# Patient Record
Sex: Female | Born: 1955 | Race: White | Hispanic: No | Marital: Single | Smoking: Never smoker
Health system: Southern US, Community
[De-identification: ages and names within clinical notes are randomized; demographics above are authoritative.]

## PROBLEM LIST (undated history)

## (undated) DIAGNOSIS — Z8709 Personal history of other diseases of the respiratory system: Secondary | ICD-10-CM

## (undated) DIAGNOSIS — K2270 Barrett's esophagus without dysplasia: Secondary | ICD-10-CM

## (undated) DIAGNOSIS — F419 Anxiety disorder, unspecified: Secondary | ICD-10-CM

## (undated) DIAGNOSIS — D649 Anemia, unspecified: Secondary | ICD-10-CM

## (undated) DIAGNOSIS — F329 Major depressive disorder, single episode, unspecified: Secondary | ICD-10-CM

## (undated) DIAGNOSIS — E119 Type 2 diabetes mellitus without complications: Secondary | ICD-10-CM

## (undated) DIAGNOSIS — R42 Dizziness and giddiness: Secondary | ICD-10-CM

## (undated) DIAGNOSIS — J189 Pneumonia, unspecified organism: Secondary | ICD-10-CM

## (undated) DIAGNOSIS — I10 Essential (primary) hypertension: Secondary | ICD-10-CM

## (undated) DIAGNOSIS — M1990 Unspecified osteoarthritis, unspecified site: Secondary | ICD-10-CM

## (undated) DIAGNOSIS — F4310 Post-traumatic stress disorder, unspecified: Secondary | ICD-10-CM

## (undated) DIAGNOSIS — C801 Malignant (primary) neoplasm, unspecified: Secondary | ICD-10-CM

## (undated) DIAGNOSIS — R569 Unspecified convulsions: Secondary | ICD-10-CM

## (undated) DIAGNOSIS — Z8701 Personal history of pneumonia (recurrent): Secondary | ICD-10-CM

## (undated) DIAGNOSIS — N289 Disorder of kidney and ureter, unspecified: Secondary | ICD-10-CM

## (undated) DIAGNOSIS — Z9289 Personal history of other medical treatment: Secondary | ICD-10-CM

## (undated) DIAGNOSIS — K589 Irritable bowel syndrome without diarrhea: Secondary | ICD-10-CM

## (undated) DIAGNOSIS — F32A Depression, unspecified: Secondary | ICD-10-CM

## (undated) DIAGNOSIS — K219 Gastro-esophageal reflux disease without esophagitis: Secondary | ICD-10-CM

## (undated) HISTORY — PX: ABDOMINAL HYSTERECTOMY: SHX81

## (undated) HISTORY — PX: HEMORRHOID SURGERY: SHX153

---

## 2004-10-06 ENCOUNTER — Other Ambulatory Visit: Payer: Self-pay

## 2004-10-06 ENCOUNTER — Inpatient Hospital Stay: Payer: Self-pay | Admitting: Internal Medicine

## 2004-10-20 ENCOUNTER — Ambulatory Visit: Payer: Self-pay | Admitting: Family Medicine

## 2004-11-19 ENCOUNTER — Ambulatory Visit: Payer: Self-pay | Admitting: Family Medicine

## 2004-11-27 ENCOUNTER — Ambulatory Visit: Payer: Self-pay | Admitting: Internal Medicine

## 2004-11-28 ENCOUNTER — Ambulatory Visit: Payer: Self-pay | Admitting: Internal Medicine

## 2004-12-18 ENCOUNTER — Ambulatory Visit (HOSPITAL_COMMUNITY): Admission: RE | Admit: 2004-12-18 | Discharge: 2004-12-18 | Payer: Self-pay | Admitting: General Surgery

## 2005-01-15 ENCOUNTER — Encounter: Admission: RE | Admit: 2005-01-15 | Discharge: 2005-01-15 | Payer: Self-pay | Admitting: Family Medicine

## 2005-04-27 HISTORY — PX: JOINT REPLACEMENT: SHX530

## 2005-05-26 ENCOUNTER — Inpatient Hospital Stay (HOSPITAL_COMMUNITY): Admission: RE | Admit: 2005-05-26 | Discharge: 2005-05-31 | Payer: Self-pay | Admitting: Orthopedic Surgery

## 2005-05-26 ENCOUNTER — Ambulatory Visit: Payer: Self-pay | Admitting: Physical Medicine & Rehabilitation

## 2005-06-23 ENCOUNTER — Encounter: Payer: Self-pay | Admitting: Orthopedic Surgery

## 2005-08-18 ENCOUNTER — Ambulatory Visit: Payer: Self-pay | Admitting: Internal Medicine

## 2005-08-19 ENCOUNTER — Ambulatory Visit: Payer: Self-pay | Admitting: Internal Medicine

## 2006-03-03 ENCOUNTER — Emergency Department: Payer: Self-pay | Admitting: Emergency Medicine

## 2008-02-21 ENCOUNTER — Emergency Department: Payer: Self-pay | Admitting: Emergency Medicine

## 2008-11-20 ENCOUNTER — Other Ambulatory Visit: Payer: Self-pay | Admitting: Internal Medicine

## 2008-12-04 ENCOUNTER — Other Ambulatory Visit: Payer: Self-pay | Admitting: Internal Medicine

## 2009-03-05 ENCOUNTER — Ambulatory Visit: Payer: Self-pay | Admitting: Gastroenterology

## 2009-04-27 HISTORY — PX: CYST EXCISION: SHX5701

## 2009-07-17 ENCOUNTER — Ambulatory Visit: Payer: Self-pay | Admitting: Internal Medicine

## 2009-07-24 ENCOUNTER — Ambulatory Visit: Payer: Self-pay | Admitting: Internal Medicine

## 2009-08-14 ENCOUNTER — Ambulatory Visit: Payer: Self-pay | Admitting: Neurology

## 2009-10-22 ENCOUNTER — Other Ambulatory Visit: Payer: Self-pay | Admitting: Internal Medicine

## 2010-01-31 ENCOUNTER — Emergency Department: Payer: Self-pay | Admitting: Emergency Medicine

## 2010-02-10 ENCOUNTER — Ambulatory Visit: Payer: Self-pay | Admitting: Gastroenterology

## 2010-02-12 LAB — PATHOLOGY REPORT

## 2010-03-17 ENCOUNTER — Ambulatory Visit: Payer: Self-pay | Admitting: Otolaryngology

## 2010-03-18 LAB — PATHOLOGY REPORT

## 2010-03-27 ENCOUNTER — Ambulatory Visit: Payer: Self-pay

## 2010-12-03 ENCOUNTER — Other Ambulatory Visit: Payer: Self-pay

## 2010-12-04 ENCOUNTER — Ambulatory Visit: Payer: Self-pay | Admitting: Internal Medicine

## 2011-03-02 ENCOUNTER — Ambulatory Visit: Payer: Self-pay | Admitting: Gastroenterology

## 2011-07-08 ENCOUNTER — Ambulatory Visit: Payer: Self-pay

## 2011-09-25 ENCOUNTER — Emergency Department: Payer: Self-pay | Admitting: Emergency Medicine

## 2011-09-25 LAB — COMPREHENSIVE METABOLIC PANEL
Albumin: 3.7 g/dL (ref 3.4–5.0)
BUN: 14 mg/dL (ref 7–18)
Calcium, Total: 9.1 mg/dL (ref 8.5–10.1)
Co2: 26 mmol/L (ref 21–32)
EGFR (African American): >60
EGFR (Non-African Amer.): >60
Glucose: 111 mg/dL — ABNORMAL HIGH (ref 65–99)
Potassium: 3.9 mmol/L (ref 3.5–5.1)
SGOT(AST): 28 U/L (ref 15–37)
Total Protein: 7.3 g/dL (ref 6.4–8.2)

## 2011-09-25 LAB — CBC
HCT: 35.8 % (ref 35.0–47.0)
MCH: 28 pg (ref 26.0–34.0)
MCHC: 34.3 g/dL (ref 32.0–36.0)
Platelet: 199 10*3/uL (ref 150–440)
RBC: 4.37 10*6/uL (ref 3.80–5.20)
WBC: 4.9 10*3/uL (ref 3.6–11.0)

## 2011-09-25 LAB — URINALYSIS, COMPLETE
Bilirubin,UR: NEGATIVE
Blood: NEGATIVE
Leukocyte Esterase: NEGATIVE
Nitrite: NEGATIVE
RBC,UR: 1 /HPF (ref 0–5)
Squamous Epithelial: 4

## 2011-09-25 LAB — PROTIME-INR
INR: 1.1
Prothrombin Time: 14.6 secs (ref 11.5–14.7)

## 2011-09-25 LAB — PREGNANCY, URINE: Pregnancy Test, Urine: NEGATIVE m[IU]/mL

## 2011-10-06 ENCOUNTER — Ambulatory Visit: Payer: Self-pay | Admitting: Gastroenterology

## 2011-10-27 ENCOUNTER — Other Ambulatory Visit: Payer: Self-pay

## 2011-10-27 LAB — COMPREHENSIVE METABOLIC PANEL
Albumin: 4 g/dL (ref 3.4–5.0)
Alkaline Phosphatase: 109 U/L (ref 50–136)
Anion Gap: 12 (ref 7–16)
Calcium, Total: 8.7 mg/dL (ref 8.5–10.1)
EGFR (African American): >60
Glucose: 131 mg/dL — ABNORMAL HIGH (ref 65–99)
SGOT(AST): 26 U/L (ref 15–37)
SGPT (ALT): 32 U/L

## 2011-10-27 LAB — LIPID PANEL
Cholesterol: 215 mg/dL — ABNORMAL HIGH (ref 0–200)
Ldl Cholesterol, Calc: 116 mg/dL — ABNORMAL HIGH (ref 0–100)

## 2011-10-27 LAB — CBC WITH DIFFERENTIAL/PLATELET
Eosinophil #: 0.1 10*3/uL (ref 0.0–0.7)
HCT: 37.9 % (ref 35.0–47.0)
Lymphocyte #: 1.4 10*3/uL (ref 1.0–3.6)
MCHC: 33.9 g/dL (ref 32.0–36.0)
MCV: 82 fL (ref 80–100)
Neutrophil #: 5 10*3/uL (ref 1.4–6.5)
RDW: 15.7 % — ABNORMAL HIGH (ref 11.5–14.5)

## 2011-10-27 LAB — TSH: Thyroid Stimulating Horm: 3.05 u[IU]/mL

## 2011-11-05 ENCOUNTER — Other Ambulatory Visit: Payer: Self-pay | Admitting: Internal Medicine

## 2012-05-27 ENCOUNTER — Other Ambulatory Visit: Payer: Self-pay

## 2012-05-27 LAB — CBC WITH DIFFERENTIAL/PLATELET
Basophil #: 0 10*3/uL (ref 0.0–0.1)
Basophil %: 0.7 %
Eosinophil #: 0.1 10*3/uL (ref 0.0–0.7)
Eosinophil %: 2 %
HGB: 11.8 g/dL — ABNORMAL LOW (ref 12.0–16.0)
MCH: 26.9 pg (ref 26.0–34.0)
MCHC: 34.1 g/dL (ref 32.0–36.0)
Monocyte #: 0.5 x10 3/mm (ref 0.2–0.9)
RBC: 4.4 10*6/uL (ref 3.80–5.20)
RDW: 15.1 % — ABNORMAL HIGH (ref 11.5–14.5)
WBC: 5.3 10*3/uL (ref 3.6–11.0)

## 2012-05-27 LAB — COMPREHENSIVE METABOLIC PANEL
Albumin: 4.1 g/dL (ref 3.4–5.0)
Alkaline Phosphatase: 110 U/L (ref 50–136)
Calcium, Total: 9 mg/dL (ref 8.5–10.1)
EGFR (African American): >60
EGFR (Non-African Amer.): >60
Glucose: 100 mg/dL — ABNORMAL HIGH (ref 65–99)
Osmolality: 267 (ref 275–301)
SGOT(AST): 32 U/L (ref 15–37)
SGPT (ALT): 29 U/L (ref 12–78)
Total Protein: 8.3 g/dL — ABNORMAL HIGH (ref 6.4–8.2)

## 2012-08-24 ENCOUNTER — Ambulatory Visit: Payer: Self-pay

## 2012-08-24 LAB — CREATININE, SERUM: EGFR (African American): >60

## 2013-05-09 ENCOUNTER — Ambulatory Visit: Payer: Self-pay

## 2013-08-17 DIAGNOSIS — I10 Essential (primary) hypertension: Secondary | ICD-10-CM | POA: Diagnosis present

## 2013-08-17 DIAGNOSIS — G40909 Epilepsy, unspecified, not intractable, without status epilepticus: Secondary | ICD-10-CM | POA: Insufficient documentation

## 2013-08-17 DIAGNOSIS — K929 Disease of digestive system, unspecified: Secondary | ICD-10-CM | POA: Insufficient documentation

## 2013-08-17 DIAGNOSIS — R569 Unspecified convulsions: Secondary | ICD-10-CM | POA: Insufficient documentation

## 2013-08-17 DIAGNOSIS — E785 Hyperlipidemia, unspecified: Secondary | ICD-10-CM | POA: Insufficient documentation

## 2014-04-27 DIAGNOSIS — C801 Malignant (primary) neoplasm, unspecified: Secondary | ICD-10-CM

## 2014-04-27 HISTORY — DX: Malignant (primary) neoplasm, unspecified: C80.1

## 2014-08-02 DIAGNOSIS — G40309 Generalized idiopathic epilepsy and epileptic syndromes, not intractable, without status epilepticus: Secondary | ICD-10-CM | POA: Diagnosis not present

## 2014-08-02 DIAGNOSIS — F411 Generalized anxiety disorder: Secondary | ICD-10-CM | POA: Diagnosis not present

## 2014-08-02 DIAGNOSIS — D649 Anemia, unspecified: Secondary | ICD-10-CM | POA: Diagnosis not present

## 2014-08-02 DIAGNOSIS — K219 Gastro-esophageal reflux disease without esophagitis: Secondary | ICD-10-CM | POA: Diagnosis not present

## 2014-08-03 DIAGNOSIS — G40309 Generalized idiopathic epilepsy and epileptic syndromes, not intractable, without status epilepticus: Secondary | ICD-10-CM | POA: Diagnosis not present

## 2014-08-03 DIAGNOSIS — K210 Gastro-esophageal reflux disease with esophagitis: Secondary | ICD-10-CM | POA: Diagnosis not present

## 2014-08-03 DIAGNOSIS — D649 Anemia, unspecified: Secondary | ICD-10-CM | POA: Diagnosis not present

## 2014-08-03 DIAGNOSIS — I10 Essential (primary) hypertension: Secondary | ICD-10-CM | POA: Diagnosis not present

## 2014-08-20 DIAGNOSIS — K921 Melena: Secondary | ICD-10-CM | POA: Diagnosis not present

## 2014-08-20 DIAGNOSIS — D509 Iron deficiency anemia, unspecified: Secondary | ICD-10-CM | POA: Diagnosis not present

## 2014-08-20 DIAGNOSIS — K2270 Barrett's esophagus without dysplasia: Secondary | ICD-10-CM | POA: Diagnosis not present

## 2014-08-28 ENCOUNTER — Encounter: Payer: Self-pay | Admitting: *Deleted

## 2014-08-30 DIAGNOSIS — G40219 Localization-related (focal) (partial) symptomatic epilepsy and epileptic syndromes with complex partial seizures, intractable, without status epilepticus: Secondary | ICD-10-CM | POA: Diagnosis not present

## 2014-08-30 NOTE — Discharge Instructions (Signed)

## 2014-08-31 ENCOUNTER — Other Ambulatory Visit: Payer: Self-pay | Admitting: Gastroenterology

## 2014-08-31 ENCOUNTER — Ambulatory Visit: Payer: Medicare Other | Admitting: Anesthesiology

## 2014-08-31 ENCOUNTER — Encounter
Admission: RE | Disposition: A | Discharge status: Home / Self Care | Payer: Self-pay | Source: Ambulatory Visit | Attending: Gastroenterology

## 2014-08-31 ENCOUNTER — Ambulatory Visit
Admission: RE | Admit: 2014-08-31 | Discharge: 2014-08-31 | Disposition: A | Discharge status: Home / Self Care | Payer: Medicare Other | Source: Ambulatory Visit | Attending: Gastroenterology | Admitting: Gastroenterology

## 2014-08-31 DIAGNOSIS — K2950 Unspecified chronic gastritis without bleeding: Secondary | ICD-10-CM | POA: Insufficient documentation

## 2014-08-31 DIAGNOSIS — D509 Iron deficiency anemia, unspecified: Secondary | ICD-10-CM | POA: Diagnosis not present

## 2014-08-31 DIAGNOSIS — F419 Anxiety disorder, unspecified: Secondary | ICD-10-CM | POA: Insufficient documentation

## 2014-08-31 DIAGNOSIS — F329 Major depressive disorder, single episode, unspecified: Secondary | ICD-10-CM | POA: Diagnosis not present

## 2014-08-31 DIAGNOSIS — K2970 Gastritis, unspecified, without bleeding: Secondary | ICD-10-CM | POA: Diagnosis not present

## 2014-08-31 DIAGNOSIS — K589 Irritable bowel syndrome without diarrhea: Secondary | ICD-10-CM | POA: Insufficient documentation

## 2014-08-31 DIAGNOSIS — K921 Melena: Secondary | ICD-10-CM | POA: Diagnosis not present

## 2014-08-31 DIAGNOSIS — Z90710 Acquired absence of both cervix and uterus: Secondary | ICD-10-CM | POA: Diagnosis not present

## 2014-08-31 DIAGNOSIS — D124 Benign neoplasm of descending colon: Secondary | ICD-10-CM | POA: Insufficient documentation

## 2014-08-31 DIAGNOSIS — Z79899 Other long term (current) drug therapy: Secondary | ICD-10-CM | POA: Insufficient documentation

## 2014-08-31 DIAGNOSIS — R569 Unspecified convulsions: Secondary | ICD-10-CM | POA: Insufficient documentation

## 2014-08-31 DIAGNOSIS — R509 Fever, unspecified: Secondary | ICD-10-CM | POA: Diagnosis not present

## 2014-08-31 DIAGNOSIS — K219 Gastro-esophageal reflux disease without esophagitis: Secondary | ICD-10-CM | POA: Diagnosis not present

## 2014-08-31 DIAGNOSIS — Z96641 Presence of right artificial hip joint: Secondary | ICD-10-CM | POA: Diagnosis not present

## 2014-08-31 DIAGNOSIS — I10 Essential (primary) hypertension: Secondary | ICD-10-CM | POA: Insufficient documentation

## 2014-08-31 DIAGNOSIS — Z9889 Other specified postprocedural states: Secondary | ICD-10-CM | POA: Insufficient documentation

## 2014-08-31 DIAGNOSIS — Z791 Long term (current) use of non-steroidal anti-inflammatories (NSAID): Secondary | ICD-10-CM | POA: Diagnosis not present

## 2014-08-31 DIAGNOSIS — K229 Disease of esophagus, unspecified: Secondary | ICD-10-CM | POA: Diagnosis not present

## 2014-08-31 DIAGNOSIS — K2270 Barrett's esophagus without dysplasia: Secondary | ICD-10-CM | POA: Diagnosis not present

## 2014-08-31 DIAGNOSIS — K641 Second degree hemorrhoids: Secondary | ICD-10-CM | POA: Insufficient documentation

## 2014-08-31 HISTORY — DX: Irritable bowel syndrome, unspecified: K58.9

## 2014-08-31 HISTORY — DX: Dizziness and giddiness: R42

## 2014-08-31 HISTORY — DX: Unspecified convulsions: R56.9

## 2014-08-31 HISTORY — DX: Essential (primary) hypertension: I10

## 2014-08-31 HISTORY — DX: Gastro-esophageal reflux disease without esophagitis: K21.9

## 2014-08-31 HISTORY — DX: Major depressive disorder, single episode, unspecified: F32.9

## 2014-08-31 HISTORY — DX: Barrett's esophagus without dysplasia: K22.70

## 2014-08-31 HISTORY — DX: Anxiety disorder, unspecified: F41.9

## 2014-08-31 HISTORY — DX: Depression, unspecified: F32.A

## 2014-08-31 HISTORY — PX: COLONOSCOPY: SHX5424

## 2014-08-31 HISTORY — PX: ESOPHAGOGASTRODUODENOSCOPY: SHX5428

## 2014-08-31 HISTORY — DX: Anemia, unspecified: D64.9

## 2014-08-31 SURGERY — COLONOSCOPY
Anesthesia: Monitor Anesthesia Care | Wound class: Contaminated

## 2014-08-31 MED ORDER — ACETAMINOPHEN 325 MG PO TABS: 325.0000 mg | ORAL_TABLET | ORAL | Status: DC | PRN

## 2014-08-31 MED ORDER — STERILE WATER FOR IRRIGATION IR SOLN
Status: DC | PRN
  Administered 2014-08-31: 11:00:00

## 2014-08-31 MED ORDER — ACETAMINOPHEN 160 MG/5ML PO SOLN: 325.0000 mg | ORAL | Status: DC | PRN

## 2014-08-31 MED ORDER — PROPOFOL 10 MG/ML IV BOLUS
INTRAVENOUS | Status: DC | PRN
  Administered 2014-08-31 (×6): 50 mg via INTRAVENOUS

## 2014-08-31 MED ORDER — LACTATED RINGERS IV SOLN
INTRAVENOUS | Status: DC
  Administered 2014-08-31: 11:00:00 via INTRAVENOUS

## 2014-08-31 MED ORDER — LIDOCAINE HCL (CARDIAC) 20 MG/ML IV SOLN
INTRAVENOUS | Status: DC | PRN
  Administered 2014-08-31: 50 mg via INTRAVENOUS

## 2014-08-31 SURGICAL SUPPLY — 38 items
BALLN DILATOR 10-12 8 (BALLOONS)
BALLN DILATOR 12-15 8 (BALLOONS)
BALLN DILATOR 15-18 8 (BALLOONS)
BALLN DILATOR CRE 0-12 8 (BALLOONS)
BALLN DILATOR ESOPH 8 10 CRE (MISCELLANEOUS) IMPLANT
BALLOON DILATOR 12-15 8 (BALLOONS) IMPLANT
BALLOON DILATOR 15-18 8 (BALLOONS) IMPLANT
BALLOON DILATOR CRE 0-12 8 (BALLOONS) IMPLANT
BLOCK BITE 60FR ADLT L/F GRN (MISCELLANEOUS) ×3 IMPLANT
CANISTER SUCT 1200ML W/VALVE (MISCELLANEOUS) ×3 IMPLANT
FCP ESCP3.2XJMB 240X2.8X (MISCELLANEOUS) ×1
FORCEPS BIOP RAD 4 LRG CAP 4 (CUTTING FORCEPS) IMPLANT
FORCEPS BIOP RJ4 240 W/NDL (MISCELLANEOUS) ×3
FORCEPS ESCP3.2XJMB 240X2.8X (MISCELLANEOUS) IMPLANT
GOWN CVR UNV OPN BCK APRN NK (MISCELLANEOUS) ×2 IMPLANT
GOWN ISOL THUMB LOOP REG UNIV (MISCELLANEOUS) ×6
HEMOCLIP INSTINCT (CLIP) IMPLANT
INJECTOR VARIJECT VIN23 (MISCELLANEOUS) IMPLANT
KIT CO2 TUBING (TUBING) ×3 IMPLANT
KIT DEFENDO VALVE AND CONN (KITS) IMPLANT
KIT ENDO PROCEDURE OLY (KITS) ×3 IMPLANT
LIGATOR MULTIBAND 6SHOOTER MBL (MISCELLANEOUS) IMPLANT
MARKER SPOT ENDO TATTOO 5ML (MISCELLANEOUS) IMPLANT
PAD GROUND ADULT SPLIT (MISCELLANEOUS) IMPLANT
SNARE SHORT THROW 13M SML OVAL (MISCELLANEOUS) IMPLANT
SNARE SHORT THROW 30M LRG OVAL (MISCELLANEOUS) IMPLANT
SPOT EX ENDOSCOPIC TATTOO (MISCELLANEOUS)
SUCTION POLY TRAP 4CHAMBER (MISCELLANEOUS) IMPLANT
SYR INFLATION 60ML (SYRINGE) IMPLANT
TRAP SUCTION POLY (MISCELLANEOUS) IMPLANT
TUBING CONN 6MMX3.1M (TUBING)
TUBING SUCTION CONN 0.25 STRL (TUBING) IMPLANT
UNDERPAD 30X60 958B10 (PK) (MISCELLANEOUS) IMPLANT
VALVE BIOPSY ENDO (VALVE) IMPLANT
VARIJECT INJECTOR VIN23 (MISCELLANEOUS)
WATER AUXILLARY (MISCELLANEOUS) IMPLANT
WATER STERILE IRR 500ML POUR (IV SOLUTION) ×3 IMPLANT
WIRE CRE 18-20MM 8CM F G (MISCELLANEOUS) IMPLANT

## 2014-08-31 NOTE — Op Note (Signed)
[facility] Gastroenterology Patient Name: Christina French Procedure Date: 08/31/2014 10:31 AM MRN: [id number] Account #: [id number] Date of Birth: Oct 26, 1955 Admit Type: Outpatient Age: 59 Room: [facility] OR ROOM 01 Gender: Female Note Status: Finalized Procedure:         Colonoscopy Indications:       Iron deficiency anemia Providers:         Lucilla Lame, MD Medicines:         Propofol per Anesthesia Complications:     No immediate complications. Procedure:         Pre-Anesthesia Assessment:                    - Prior to the procedure, a History and Physical was                     performed, and patient medications and allergies were                     reviewed. The patient's tolerance of previous anesthesia                     was also reviewed. The risks and benefits of the procedure                     and the sedation options and risks were discussed with the                     patient. All questions were answered, and informed consent                     was obtained. Prior Anticoagulants: The patient has taken                     no previous anticoagulant or antiplatelet agents. ASA                     Grade Assessment: II - A patient with mild systemic                     disease. After reviewing the risks and benefits, the                     patient was deemed in satisfactory condition to undergo                     the procedure.                    After obtaining informed consent, the colonoscope was                     passed under direct vision. Throughout the procedure, the                     patient's blood pressure, pulse, and oxygen saturations                     were monitored continuously. The Olympus CF H180AL                     colonoscope (S#: [id number]) was introduced through the anus                     and advanced to the the cecum, identified by appendiceal  orifice and ileocecal valve. The colonoscopy was performed                without difficulty. The patient tolerated the procedure                     well. The quality of the bowel preparation was fair. Findings:      The perianal and digital rectal examinations were normal.      Two sessile polyps were found in the descending colon. The polyps were 3       to 4 mm in size. These polyps were removed with a cold biopsy forceps.       Resection and retrieval were complete.      Non-bleeding internal hemorrhoids were found during retroflexion. The       hemorrhoids were Grade II (internal hemorrhoids that prolapse but reduce       spontaneously).      Two biopsies were obtained with cold forceps for histology randomly in       the sigmoid colon. Impression:        - Two 3 to 4 mm polyps in the descending colon. Resected                     and retrieved.                    - Non-bleeding internal hemorrhoids.                    - Two biopsies were obtained in the sigmoid colon. Recommendation:    - Follow Hb.                    If continued to have bleeding then surgery consult. Procedure Code(s): --- Professional ---                    [phone], Colonoscopy, flexible; with biopsy, single or                     multiple Diagnosis Code(s): --- Professional ---                    D50.9, Iron deficiency anemia, unspecified                    D12.4, Benign neoplasm of descending colon CPT copyright 2014 American Medical Association. All rights reserved. The codes documented in this report are preliminary and upon coder review may  be revised to meet current compliance requirements. Lucilla Lame, MD 08/31/2014 11:02:36 AM This report has been signed electronically. Number of Addenda: 0 Note Initiated On: 08/31/2014 10:31 AM Scope Withdrawal Time: 0 hours 6 minutes 46 seconds  Total Procedure Duration: 0 hours 10 minutes 0 seconds       [facility]

## 2014-08-31 NOTE — Anesthesia Preprocedure Evaluation (Addendum)
Anesthesia Evaluation  Patient identified by MRN, date of birth, ID band Patient awake    Reviewed: Allergy & Precautions, H&P , NPO status , Patient's Chart, lab work & pertinent test results, reviewed documented beta blocker date and time   Airway Mallampati: I  TM Distance: >3 FB Neck ROM: full    Dental no notable dental hx.    Pulmonary neg pulmonary ROS,    Pulmonary exam normal       Cardiovascular hypertension, Normal cardiovascular exam    Neuro/Psych    GI/Hepatic Neg liver ROS, GERD-  ,  Endo/Other  negative endocrine ROS  Renal/GU negative Renal ROS     Musculoskeletal   Abdominal   Peds  Hematology  (+) anemia , Chronic anemia, from bleeding hemorrhoids. Hgb has been as low as 3.  Received blood transfusion last month after she experienced a seizure from severe anemia   Anesthesia Other Findings   Reproductive/Obstetrics                            Anesthesia Physical Anesthesia Plan  ASA: II  Anesthesia Plan: MAC   Post-op Pain Management:    Induction:   Airway Management Planned:   Additional Equipment:   Intra-op Plan:   Post-operative Plan:   Informed Consent: I have reviewed the patients History and Physical, chart, labs and discussed the procedure including the risks, benefits and alternatives for the proposed anesthesia with the patient or authorized representative who has indicated his/her understanding and acceptance.     Plan Discussed with: CRNA  Anesthesia Plan Comments:         Anesthesia Quick Evaluation

## 2014-08-31 NOTE — Anesthesia Procedure Notes (Signed)
Procedure Name: MAC Performed by: Sidney Kann Pre-anesthesia Checklist: Patient identified, Emergency Drugs available, Suction available, Timeout performed and Patient being monitored Patient Re-evaluated:Patient Re-evaluated prior to inductionOxygen Delivery Method: Nasal cannula Placement Confirmation: positive ETCO2     

## 2014-08-31 NOTE — Transfer of Care (Signed)
Immediate Anesthesia Transfer of Care Note  Patient: Christina French  Procedure(s) Performed: Procedure(s): COLONOSCOPY (N/A) ESOPHAGOGASTRODUODENOSCOPY (EGD) (N/A)  Patient Location: PACU  Anesthesia Type: MAC  Level of Consciousness: awake, alert  and patient cooperative  Airway and Oxygen Therapy: Patient Spontanous Breathing and Patient connected to supplemental oxygen  Post-op Assessment: Post-op Vital signs reviewed, Patient's Cardiovascular Status Stable, Respiratory Function Stable, Patent Airway and No signs of Nausea or vomiting  Post-op Vital Signs: Reviewed and stable  Complications: No apparent anesthesia complications

## 2014-08-31 NOTE — H&P (Signed)
@LOGO @  Primary Care Physician:  No primary care provider on file. Primary Gastroenterologist:  Dr. Allen Norris  Pre-Procedure History & Physical: HPI:  Christina French is a 59 y.o. female is here for an endoscopy and colonoscopy.   Past Medical History  Diagnosis Date  . Hypertension   . Anemia   . Anxiety   . Depression   . Seizures     rare - last one 07/30/14 - was very anemic  . GERD (gastroesophageal reflux disease)   . IBS (irritable bowel syndrome)   . Barrett esophagus   . Vertigo     thinks related to sinus issues    Past Surgical History  Procedure Laterality Date  . Abdominal hysterectomy    . Hemorrhoid surgery    . Joint replacement Right 2007    hip  . Cyst excision  2011    from throat  . Cesarean section      Prior to Admission medications   Medication Sig Start Date End Date Taking? Authorizing Provider  ALPRAZolam Duanne Moron) 1 MG tablet Take 1 mg by mouth 2 (two) times daily.   Yes Historical Provider, MD  atenolol (TENORMIN) 50 MG tablet Take 50 mg by mouth 2 (two) times daily.   Yes Historical Provider, MD  carbamazepine (TEGRETOL) 200 MG tablet Take 300 mg by mouth 2 (two) times daily.   Yes Historical Provider, MD  dexlansoprazole (DEXILANT) 60 MG capsule Take 60 mg by mouth daily. AM   Yes Historical Provider, MD  escitalopram (LEXAPRO) 20 MG tablet Take 20 mg by mouth daily. PM   Yes Historical Provider, MD  hyoscyamine (LEVSIN, ANASPAZ) 0.125 MG tablet Take 0.125 mg by mouth 4 (four) times daily as needed.   Yes Historical Provider, MD  ibuprofen (ADVIL,MOTRIN) 200 MG tablet Take 200 mg by mouth as needed.   Yes Historical Provider, MD  mirtazapine (REMERON) 15 MG tablet Take 15 mg by mouth at bedtime.   Yes Historical Provider, MD  ondansetron (ZOFRAN) 8 MG tablet Take by mouth 2 (two) times daily as needed for nausea or vomiting.   Yes Historical Provider, MD  traZODone (DESYREL) 50 MG tablet Take 50 mg by mouth at bedtime.   Yes Historical Provider, MD     Allergies as of 08/27/2014  . (Not on File)    History reviewed. No pertinent family history.  History   Social History  . Marital Status: Divorced    Spouse Name: N/A  . Number of Children: N/A  . Years of Education: N/A   Occupational History  . Not on file.   Social History Main Topics  . Smoking status: Never Smoker   . Smokeless tobacco: Not on file  . Alcohol Use: No  . Drug Use: Not on file  . Sexual Activity: Not on file   Other Topics Concern  . Not on file   Social History Narrative    Review of Systems: See HPI, otherwise negative ROS  Physical Exam: BP 123/78 mmHg  Pulse 75  Temp(Src) 97.5 F (36.4 C)  Resp 16  Ht 5\' 2"  (1.575 m)  Wt 194 lb (87.998 kg)  BMI 35.47 kg/m2  SpO2 100% General:   Alert,  pleasant and cooperative in NAD Head:  Normocephalic and atraumatic. Neck:  Supple; no masses or thyromegaly. Lungs:  Clear throughout to auscultation.    Heart:  Regular rate and rhythm. Abdomen:  Soft, nontender and nondistended. Normal bowel sounds, without guarding, and without rebound.   Neurologic:  Alert and  oriented x4;  grossly normal neurologically.  Impression/Plan: Christina French is here for an endoscopy and colonoscopy to be performed for IDA  Risks, benefits, limitations, and alternatives regarding  endoscopy and colonoscopy have been reviewed with the patient.  Questions have been answered.  All parties agreeable.   [facility], MD  08/31/2014, 10:34 AM

## 2014-08-31 NOTE — Op Note (Signed)
[facility] Gastroenterology Patient Name: Christina French Procedure Date: 08/31/2014 10:29 AM MRN: [id number] Account #: [id number] Date of Birth: April 10, 1956 Admit Type: Outpatient Age: 59 Room: [facility] OR ROOM 01 Gender: Female Note Status: Finalized Procedure:         Upper GI endoscopy Indications:       Iron deficiency anemia Providers:         Lucilla Lame, MD Medicines:         Propofol per Anesthesia Complications:     No immediate complications. Procedure:         Pre-Anesthesia Assessment:                    - Prior to the procedure, a History and Physical was                     performed, and patient medications and allergies were                     reviewed. The patient's tolerance of previous anesthesia                     was also reviewed. The risks and benefits of the procedure                     and the sedation options and risks were discussed with the                     patient. All questions were answered, and informed consent                     was obtained. Prior Anticoagulants: The patient has taken                     no previous anticoagulant or antiplatelet agents. ASA                     Grade Assessment: II - A patient with mild systemic                     disease. After reviewing the risks and benefits, the                     patient was deemed in satisfactory condition to undergo                     the procedure.                    After obtaining informed consent, the endoscope was passed                     under direct vision. Throughout the procedure, the                     patient's blood pressure, pulse, and oxygen saturations                     were monitored continuously. The Olympus GIF H180J                     colonscope (B#:[id number]) was introduced through the mouth,                     and advanced to the second part of duodenum. The  upper GI                     endoscopy was accomplished without difficulty. The patient                      tolerated the procedure well. Findings:      There were esophageal mucosal changes suspicious for long-segment       Barrett's esophagus present in the lower third of the esophagus. The       maximum longitudinal extent of these mucosal changes was 4 cm in length.       Mucosa was biopsied with a cold forceps for histology in 4 quadrants at       intervals of 1 cm in the lower third of the esophagus. A total of 3       specimen bottles were sent to pathology.      Localized mild inflammation characterized by erythema was found in the       gastric antrum. Biopsies were taken with a cold forceps for histology.      The examined duodenum was normal. Impression:        - Esophageal mucosal changes suspicious for long-segment                     Barrett's esophagus. Biopsied.                    - Gastritis. Biopsied.                    - Normal examined duodenum. Recommendation:    - Await pathology results.                    - Perform a colonoscopy today. Procedure Code(s): --- Professional ---                    [phone], Esophagogastroduodenoscopy, flexible, transoral;                     with biopsy, single or multiple Diagnosis Code(s): --- Professional ---                    D50.9, Iron deficiency anemia, unspecified                    K29.70, Gastritis, unspecified, without bleeding                    K22.9, Disease of esophagus, unspecified CPT copyright 2014 American Medical Association. All rights reserved. The codes documented in this report are preliminary and upon coder review may  be revised to meet current compliance requirements. Lucilla Lame, MD 08/31/2014 10:47:48 AM This report has been signed electronically. Number of Addenda: 0 Note Initiated On: 08/31/2014 10:29 AM Total Procedure Duration: 0 hours 4 minutes 49 seconds       [facility]

## 2014-08-31 NOTE — Anesthesia Postprocedure Evaluation (Signed)
  Anesthesia Post-op Note  Patient: Christina French  Procedure(s) Performed: Procedure(s): COLONOSCOPY (N/A) ESOPHAGOGASTRODUODENOSCOPY (EGD) (N/A)  Anesthesia type:MAC  Patient location: PACU  Post pain: Pain level controlled  Post assessment: Post-op Vital signs reviewed, Patient's Cardiovascular Status Stable, Respiratory Function Stable, Patent Airway and No signs of Nausea or vomiting  Post vital signs: Reviewed and stable  Last Vitals:  Filed Vitals:   08/31/14 1121  BP: 137/82  Pulse: 69  Temp:   Resp: 18    Level of consciousness: awake, alert  and patient cooperative  Complications: No apparent anesthesia complications

## 2014-09-04 ENCOUNTER — Encounter: Payer: Self-pay | Admitting: Gastroenterology

## 2014-09-04 DIAGNOSIS — D485 Neoplasm of uncertain behavior of skin: Secondary | ICD-10-CM | POA: Diagnosis not present

## 2014-09-04 DIAGNOSIS — R21 Rash and other nonspecific skin eruption: Secondary | ICD-10-CM | POA: Diagnosis not present

## 2014-09-10 DIAGNOSIS — K2270 Barrett's esophagus without dysplasia: Secondary | ICD-10-CM | POA: Diagnosis not present

## 2014-09-10 DIAGNOSIS — R140 Abdominal distension (gaseous): Secondary | ICD-10-CM | POA: Diagnosis not present

## 2014-09-10 DIAGNOSIS — K648 Other hemorrhoids: Secondary | ICD-10-CM | POA: Diagnosis not present

## 2014-09-10 DIAGNOSIS — D509 Iron deficiency anemia, unspecified: Secondary | ICD-10-CM | POA: Diagnosis not present

## 2014-09-18 ENCOUNTER — Ambulatory Visit: Admission: RE | Admit: 2014-09-18 | Payer: Medicare Other | Source: Ambulatory Visit | Admitting: Gastroenterology

## 2014-09-18 ENCOUNTER — Ambulatory Visit: Admit: 2014-09-18 | Payer: Self-pay | Admitting: Gastroenterology

## 2014-09-18 ENCOUNTER — Encounter: Admission: RE | Payer: Self-pay | Source: Ambulatory Visit

## 2014-09-18 SURGERY — IMAGING PROCEDURE, GI TRACT, INTRALUMINAL, VIA CAPSULE

## 2014-09-30 ENCOUNTER — Encounter: Payer: Self-pay | Admitting: Urgent Care

## 2014-09-30 NOTE — Telephone Encounter (Signed)
A user error has taken place: encounter opened in error, closed for administrative reasons.

## 2014-10-15 DIAGNOSIS — D039 Melanoma in situ, unspecified: Secondary | ICD-10-CM | POA: Diagnosis not present

## 2014-10-15 DIAGNOSIS — D0371 Melanoma in situ of right lower limb, including hip: Secondary | ICD-10-CM | POA: Diagnosis not present

## 2014-10-24 ENCOUNTER — Other Ambulatory Visit: Payer: Self-pay

## 2014-10-24 NOTE — Telephone Encounter (Signed)
Received Refill request for patient for Hyoscyamine sulfate 0.375 BID, PRN. PCP originally prescribed this medication but we are now patient's GI specialist.  Pt last seen in office 09/11/14 and was recommended to have a Given's Capsule Study as she had EGD and Colonoscopy on 09/04/14 with no source of Anemia.  Pt has not had Given's completed as of yet after multiple attempts to contact patient.  Do you want to refill Hyoscyamine or have Given's completed first?

## 2014-10-25 MED ORDER — HYOSCYAMINE SULFATE 0.125 MG PO TABS: 0.1250 mg | ORAL_TABLET | Freq: Four times a day (QID) | ORAL | Status: DC | PRN

## 2014-10-25 NOTE — Telephone Encounter (Signed)
OK for 30 day refill Please call & ask patient about reason for not scheduling capsule or send letter Thanks

## 2014-10-25 NOTE — Telephone Encounter (Signed)
Spoke with patient at this time.   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   She will call back when she feels up to having this completed.  I explained to the patient that I will send 30 day refill and that will give [place] time to get capsule completed.

## 2014-11-23 ENCOUNTER — Encounter: Payer: Self-pay | Admitting: Radiology

## 2014-11-23 ENCOUNTER — Emergency Department
Admission: EM | Admit: 2014-11-23 | Discharge: 2014-11-23 | Payer: Medicare Other | Attending: Emergency Medicine | Admitting: Emergency Medicine

## 2014-11-23 ENCOUNTER — Emergency Department: Payer: Medicare Other

## 2014-11-23 ENCOUNTER — Inpatient Hospital Stay (HOSPITAL_COMMUNITY)
Admission: EM | Admit: 2014-11-23 | Discharge: 2014-12-04 | DRG: 854 | Disposition: A | Discharge status: Skilled Nursing Facility | Payer: Medicare Other | Attending: Orthopaedic Surgery | Admitting: Orthopaedic Surgery

## 2014-11-23 DIAGNOSIS — Y9389 Activity, other specified: Secondary | ICD-10-CM | POA: Insufficient documentation

## 2014-11-23 DIAGNOSIS — Y9289 Other specified places as the place of occurrence of the external cause: Secondary | ICD-10-CM | POA: Diagnosis not present

## 2014-11-23 DIAGNOSIS — R52 Pain, unspecified: Secondary | ICD-10-CM

## 2014-11-23 DIAGNOSIS — Z96641 Presence of right artificial hip joint: Secondary | ICD-10-CM | POA: Diagnosis present

## 2014-11-23 DIAGNOSIS — S79911A Unspecified injury of right hip, initial encounter: Secondary | ICD-10-CM | POA: Insufficient documentation

## 2014-11-23 DIAGNOSIS — F418 Other specified anxiety disorders: Secondary | ICD-10-CM | POA: Diagnosis present

## 2014-11-23 DIAGNOSIS — M009 Pyogenic arthritis, unspecified: Secondary | ICD-10-CM

## 2014-11-23 DIAGNOSIS — M25551 Pain in right hip: Secondary | ICD-10-CM

## 2014-11-23 DIAGNOSIS — Z6837 Body mass index (BMI) 37.0-37.9, adult: Secondary | ICD-10-CM | POA: Diagnosis not present

## 2014-11-23 DIAGNOSIS — G40909 Epilepsy, unspecified, not intractable, without status epilepticus: Secondary | ICD-10-CM | POA: Diagnosis present

## 2014-11-23 DIAGNOSIS — D72829 Elevated white blood cell count, unspecified: Secondary | ICD-10-CM | POA: Diagnosis not present

## 2014-11-23 DIAGNOSIS — Z85820 Personal history of malignant melanoma of skin: Secondary | ICD-10-CM

## 2014-11-23 DIAGNOSIS — Z471 Aftercare following joint replacement surgery: Secondary | ICD-10-CM | POA: Diagnosis not present

## 2014-11-23 DIAGNOSIS — A419 Sepsis, unspecified organism: Principal | ICD-10-CM

## 2014-11-23 DIAGNOSIS — B9561 Methicillin susceptible Staphylococcus aureus infection as the cause of diseases classified elsewhere: Secondary | ICD-10-CM | POA: Diagnosis not present

## 2014-11-23 DIAGNOSIS — R5081 Fever presenting with conditions classified elsewhere: Secondary | ICD-10-CM | POA: Diagnosis not present

## 2014-11-23 DIAGNOSIS — K589 Irritable bowel syndrome without diarrhea: Secondary | ICD-10-CM | POA: Diagnosis present

## 2014-11-23 DIAGNOSIS — R509 Fever, unspecified: Secondary | ICD-10-CM

## 2014-11-23 DIAGNOSIS — E876 Hypokalemia: Secondary | ICD-10-CM | POA: Diagnosis not present

## 2014-11-23 DIAGNOSIS — F419 Anxiety disorder, unspecified: Secondary | ICD-10-CM | POA: Diagnosis not present

## 2014-11-23 DIAGNOSIS — T8459XS Infection and inflammatory reaction due to other internal joint prosthesis, sequela: Secondary | ICD-10-CM | POA: Diagnosis not present

## 2014-11-23 DIAGNOSIS — Y838 Other surgical procedures as the cause of abnormal reaction of the patient, or of later complication, without mention of misadventure at the time of the procedure: Secondary | ICD-10-CM | POA: Diagnosis not present

## 2014-11-23 DIAGNOSIS — Z1239 Encounter for other screening for malignant neoplasm of breast: Secondary | ICD-10-CM | POA: Insufficient documentation

## 2014-11-23 DIAGNOSIS — T84030A Mechanical loosening of internal right hip prosthetic joint, initial encounter: Secondary | ICD-10-CM | POA: Diagnosis present

## 2014-11-23 DIAGNOSIS — E222 Syndrome of inappropriate secretion of antidiuretic hormone: Secondary | ICD-10-CM | POA: Diagnosis not present

## 2014-11-23 DIAGNOSIS — Y831 Surgical operation with implant of artificial internal device as the cause of abnormal reaction of the patient, or of later complication, without mention of misadventure at the time of the procedure: Secondary | ICD-10-CM | POA: Diagnosis not present

## 2014-11-23 DIAGNOSIS — E871 Hypo-osmolality and hyponatremia: Secondary | ICD-10-CM | POA: Diagnosis present

## 2014-11-23 DIAGNOSIS — R609 Edema, unspecified: Secondary | ICD-10-CM | POA: Diagnosis not present

## 2014-11-23 DIAGNOSIS — X58XXXA Exposure to other specified factors, initial encounter: Secondary | ICD-10-CM | POA: Insufficient documentation

## 2014-11-23 DIAGNOSIS — A047 Enterocolitis due to Clostridium difficile: Secondary | ICD-10-CM | POA: Diagnosis not present

## 2014-11-23 DIAGNOSIS — Z9889 Other specified postprocedural states: Secondary | ICD-10-CM | POA: Diagnosis not present

## 2014-11-23 DIAGNOSIS — T8459XA Infection and inflammatory reaction due to other internal joint prosthesis, initial encounter: Secondary | ICD-10-CM | POA: Insufficient documentation

## 2014-11-23 DIAGNOSIS — R51 Headache: Secondary | ICD-10-CM | POA: Diagnosis not present

## 2014-11-23 DIAGNOSIS — F329 Major depressive disorder, single episode, unspecified: Secondary | ICD-10-CM | POA: Diagnosis not present

## 2014-11-23 DIAGNOSIS — R05 Cough: Secondary | ICD-10-CM

## 2014-11-23 DIAGNOSIS — B999 Unspecified infectious disease: Secondary | ICD-10-CM

## 2014-11-23 DIAGNOSIS — T8450XA Infection and inflammatory reaction due to unspecified internal joint prosthesis, initial encounter: Secondary | ICD-10-CM | POA: Diagnosis not present

## 2014-11-23 DIAGNOSIS — Y998 Other external cause status: Secondary | ICD-10-CM | POA: Insufficient documentation

## 2014-11-23 DIAGNOSIS — E663 Overweight: Secondary | ICD-10-CM | POA: Diagnosis not present

## 2014-11-23 DIAGNOSIS — Z79899 Other long term (current) drug therapy: Secondary | ICD-10-CM | POA: Insufficient documentation

## 2014-11-23 DIAGNOSIS — I10 Essential (primary) hypertension: Secondary | ICD-10-CM | POA: Diagnosis present

## 2014-11-23 DIAGNOSIS — D62 Acute posthemorrhagic anemia: Secondary | ICD-10-CM | POA: Diagnosis not present

## 2014-11-23 DIAGNOSIS — Z96649 Presence of unspecified artificial hip joint: Secondary | ICD-10-CM

## 2014-11-23 DIAGNOSIS — R059 Cough, unspecified: Secondary | ICD-10-CM

## 2014-11-23 DIAGNOSIS — Z113 Encounter for screening for infections with a predominantly sexual mode of transmission: Secondary | ICD-10-CM | POA: Diagnosis not present

## 2014-11-23 DIAGNOSIS — A0472 Enterocolitis due to Clostridium difficile, not specified as recurrent: Secondary | ICD-10-CM | POA: Insufficient documentation

## 2014-11-23 DIAGNOSIS — T8459XD Infection and inflammatory reaction due to other internal joint prosthesis, subsequent encounter: Secondary | ICD-10-CM | POA: Diagnosis not present

## 2014-11-23 DIAGNOSIS — A4901 Methicillin susceptible Staphylococcus aureus infection, unspecified site: Secondary | ICD-10-CM | POA: Insufficient documentation

## 2014-11-23 DIAGNOSIS — T8451XA Infection and inflammatory reaction due to internal right hip prosthesis, initial encounter: Secondary | ICD-10-CM | POA: Diagnosis not present

## 2014-11-23 HISTORY — DX: Disorder of kidney and ureter, unspecified: N28.9

## 2014-11-23 HISTORY — DX: Malignant (primary) neoplasm, unspecified: C80.1

## 2014-11-23 LAB — CBC WITH DIFFERENTIAL/PLATELET
BASOS PCT: 1 %
Basophils Absolute: 0.1 10*3/uL (ref 0–0.1)
EOS ABS: 0 10*3/uL (ref 0–0.7)
EOS PCT: 0 %
HCT: 34.2 % — ABNORMAL LOW (ref 35.0–47.0)
Hemoglobin: 11.6 g/dL — ABNORMAL LOW (ref 12.0–16.0)
LYMPHS ABS: 0.8 10*3/uL — AB (ref 1.0–3.6)
LYMPHS PCT: 4 %
MCH: 27.7 pg (ref 26.0–34.0)
MCHC: 34 g/dL (ref 32.0–36.0)
MCV: 81.5 fL (ref 80.0–100.0)
MONOS PCT: 9 %
Monocytes Absolute: 1.9 10*3/uL — ABNORMAL HIGH (ref 0.2–0.9)
NEUTROS PCT: 86 %
Neutro Abs: 19.2 10*3/uL — ABNORMAL HIGH (ref 1.4–6.5)
Platelets: 160 10*3/uL (ref 150–440)
RBC: 4.2 MIL/uL (ref 3.80–5.20)
RDW: 17.8 % — AB (ref 11.5–14.5)
WBC: 22 10*3/uL — AB (ref 3.6–11.0)

## 2014-11-23 LAB — URINALYSIS COMPLETE WITH MICROSCOPIC (ARMC ONLY)
BILIRUBIN URINE: NEGATIVE
Bacteria, UA: NONE SEEN
GLUCOSE, UA: NEGATIVE mg/dL
Hgb urine dipstick: NEGATIVE
KETONES UR: NEGATIVE mg/dL
Leukocytes, UA: NEGATIVE
Nitrite: NEGATIVE
PH: 6 (ref 5.0–8.0)
PROTEIN: 30 mg/dL — AB
RBC / HPF: NONE SEEN RBC/hpf (ref 0–5)
SPECIFIC GRAVITY, URINE: 1.023 (ref 1.005–1.030)

## 2014-11-23 LAB — HEPATIC FUNCTION PANEL
ALT: 20 U/L (ref 14–54)
AST: 30 U/L (ref 15–41)
Albumin: 4.4 g/dL (ref 3.5–5.0)
Alkaline Phosphatase: 59 U/L (ref 38–126)
BILIRUBIN INDIRECT: 0.7 mg/dL (ref 0.3–0.9)
BILIRUBIN TOTAL: 0.8 mg/dL (ref 0.3–1.2)
Bilirubin, Direct: 0.1 mg/dL (ref 0.1–0.5)
TOTAL PROTEIN: 7.5 g/dL (ref 6.5–8.1)

## 2014-11-23 LAB — BASIC METABOLIC PANEL
Anion gap: 12 (ref 5–15)
BUN: 16 mg/dL (ref 6–20)
CHLORIDE: 90 mmol/L — AB (ref 101–111)
CO2: 25 mmol/L (ref 22–32)
Calcium: 8.7 mg/dL — ABNORMAL LOW (ref 8.9–10.3)
Creatinine, Ser: 1.22 mg/dL — ABNORMAL HIGH (ref 0.44–1.00)
GFR calc Af Amer: 56 mL/min — ABNORMAL LOW (ref >60)
GFR calc non Af Amer: 48 mL/min — ABNORMAL LOW (ref >60)
Glucose, Bld: 167 mg/dL — ABNORMAL HIGH (ref 65–99)
Potassium: 4.1 mmol/L (ref 3.5–5.1)
Sodium: 127 mmol/L — ABNORMAL LOW (ref 135–145)

## 2014-11-23 LAB — C-REACTIVE PROTEIN: CRP: 15.5 mg/dL — AB (ref <1.0)

## 2014-11-23 LAB — SEDIMENTATION RATE: Sed Rate: 33 mm/hr — ABNORMAL HIGH (ref 0–30)

## 2014-11-23 MED ORDER — CARBAMAZEPINE 200 MG PO TABS
300.0000 mg | ORAL_TABLET | Freq: Two times a day (BID) | ORAL | Status: DC
  Administered 2014-11-23: 300 mg via ORAL

## 2014-11-23 MED ORDER — ACETAMINOPHEN 325 MG PO TABS
650.0000 mg | ORAL_TABLET | Freq: Once | ORAL | Status: AC
  Administered 2014-11-23: 650 mg via ORAL

## 2014-11-23 MED ORDER — IBUPROFEN 400 MG PO TABS
ORAL_TABLET | ORAL | Status: AC
  Administered 2014-11-23: 400 mg via ORAL
  Filled 2014-11-23: qty 1

## 2014-11-23 MED ORDER — VANCOMYCIN HCL IN DEXTROSE 1-5 GM/200ML-% IV SOLN
1000.0000 mg | Freq: Once | INTRAVENOUS | Status: AC
  Administered 2014-11-23: 1000 mg via INTRAVENOUS
  Filled 2014-11-23: qty 200

## 2014-11-23 MED ORDER — MORPHINE SULFATE 4 MG/ML IJ SOLN
4.0000 mg | Freq: Once | INTRAMUSCULAR | Status: AC
  Administered 2014-11-23: 4 mg via INTRAVENOUS

## 2014-11-23 MED ORDER — CEFTRIAXONE SODIUM IN DEXTROSE 40 MG/ML IV SOLN
2.0000 g | Freq: Once | INTRAVENOUS | Status: AC
  Administered 2014-11-23: 2 g via INTRAVENOUS
  Filled 2014-11-23: qty 50

## 2014-11-23 MED ORDER — ACETAMINOPHEN 325 MG PO TABS
650.0000 mg | ORAL_TABLET | Freq: Once | ORAL | Status: AC
  Administered 2014-11-23: 650 mg via ORAL
  Filled 2014-11-23: qty 2

## 2014-11-23 MED ORDER — ONDANSETRON HCL 4 MG/2ML IJ SOLN
INTRAMUSCULAR | Status: AC
  Administered 2014-11-23: 4 mg via INTRAVENOUS
  Filled 2014-11-23: qty 2

## 2014-11-23 MED ORDER — ONDANSETRON HCL 4 MG/2ML IJ SOLN
4.0000 mg | Freq: Once | INTRAMUSCULAR | Status: AC
  Administered 2014-11-23: 4 mg via INTRAVENOUS

## 2014-11-23 MED ORDER — DOXYCYCLINE HYCLATE 100 MG PO TABS
100.0000 mg | ORAL_TABLET | Freq: Two times a day (BID) | ORAL | Status: DC
  Administered 2014-11-23: 100 mg via ORAL
  Filled 2014-11-23: qty 1

## 2014-11-23 MED ORDER — ACETAMINOPHEN 325 MG PO TABS
ORAL_TABLET | ORAL | Status: AC
  Administered 2014-11-23: 650 mg via ORAL
  Filled 2014-11-23: qty 2

## 2014-11-23 MED ORDER — ACETAMINOPHEN 500 MG PO TABS: 500.0000 mg | ORAL_TABLET | Freq: Once | ORAL | Status: DC

## 2014-11-23 MED ORDER — IBUPROFEN 400 MG PO TABS
400.0000 mg | ORAL_TABLET | Freq: Once | ORAL | Status: AC
  Administered 2014-11-23: 400 mg via ORAL

## 2014-11-23 MED ORDER — SODIUM CHLORIDE 0.9 % IV BOLUS (SEPSIS)
1000.0000 mL | Freq: Once | INTRAVENOUS | Status: AC
  Administered 2014-11-23: 1000 mL via INTRAVENOUS

## 2014-11-23 MED ORDER — CARBAMAZEPINE 200 MG PO TABS
ORAL_TABLET | ORAL | Status: AC
  Administered 2014-11-23: 300 mg via ORAL
  Filled 2014-11-23: qty 2

## 2014-11-23 MED ORDER — MORPHINE SULFATE 4 MG/ML IJ SOLN
INTRAMUSCULAR | Status: AC
Start: 2014-11-23 — End: 2014-11-23
  Administered 2014-11-23: 4 mg via INTRAVENOUS
  Filled 2014-11-23: qty 1

## 2014-11-23 NOTE — ED Notes (Signed)
Family at bedside. 

## 2014-11-23 NOTE — Consult Note (Signed)
ORTHOPAEDIC CONSULTATION  PATIENT NAME: Christina French DOB: 03-09-1956  MRN: [id number]  REQUESTING PHYSICIAN: Nena Polio, MD  Chief Complaint: Right hip pain  HPI: Christina French is a 59 y.o. female seen in consultation at the request of Nena Polio, MD for  right hip pain. The patient underwent a right total hip arthroplasty in 2007 at [facility] by [facility] surgeon Dr. Marlou Sa.  She states that the surgery was uncomplicated. She did not have prolonged antibiotics postoperatively. She noted no wound healing concerns. She was very happy with the results of her surgery. Yesterday she was sitting [place] style while cleaning her back in. She felt a pop in her hip. Since that time she has had increasing right buttock pain with the inability to bear weight. She is febrile to 103F in the emergency department. 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 2 weeks ago there was a concern for infection of the surgical site. There was erythema approximately 2 inches radius surrounding the site. She was treated with antibiotics for several days. The erythema then resolved.  Past Medical History  Diagnosis Date  . Hypertension   . Anemia   . Anxiety   . Depression   . Seizures     rare - last one 07/30/14 - was very anemic  . GERD (gastroesophageal reflux disease)   . IBS (irritable bowel syndrome)   . Barrett esophagus   . Vertigo     thinks related to sinus issues   Past Surgical History  Procedure Laterality Date  . Abdominal hysterectomy    . Hemorrhoid surgery    . Joint replacement Right 2007    hip  . Cyst excision  2011    from throat  . Cesarean section    . Colonoscopy N/A 08/31/2014    Procedure: COLONOSCOPY;  Surgeon: Lucilla Lame, MD;  Location: [place];  Service: Gastroenterology;  Laterality: N/A;  . Esophagogastroduodenoscopy N/A 08/31/2014    Procedure: ESOPHAGOGASTRODUODENOSCOPY (EGD);  Surgeon:  Lucilla Lame, MD;  Location: [place];  Service: Gastroenterology;  Laterality: N/A;   History   Social History  . Marital Status: Single    Spouse Name: N/A  . Number of Children: N/A  . Years of Education: N/A   Social History Main Topics  . Smoking status: Never Smoker   . Smokeless tobacco: Not on file  . Alcohol Use: No  . Drug Use: Not on file  . Sexual Activity: Not on file   Other Topics Concern  . None   Social History Narrative   No family history on file. No Known Allergies   Prior to Admission medications   Medication Sig Start Date End Date Taking? Authorizing Provider  ALPRAZolam Duanne Moron) 1 MG tablet Take 1 mg by mouth 2 (two) times daily as needed for anxiety.    Yes Historical Provider, MD  atenolol (TENORMIN) 50 MG tablet Take 50 mg by mouth 2 (two) times daily.   Yes Historical Provider, MD  carbamazepine (TEGRETOL) 200 MG tablet Take 300 mg by mouth 2 (two) times daily.    Yes Historical Provider, MD  dexlansoprazole (DEXILANT) 60 MG capsule Take 60 mg by mouth daily.    Yes Historical Provider, MD  escitalopram (LEXAPRO) 20 MG tablet Take 20 mg by mouth daily.    Yes Historical Provider, MD  hyoscyamine (LEVSIN, ANASPAZ) 0.125 MG tablet Take 1 tablet (0.125 mg  total) by mouth 4 (four) times daily as needed. Patient taking differently: Take 0.125 mg by mouth 4 (four) times daily as needed for cramping.  10/25/14  Yes Andria Meuse, NP  ibuprofen (ADVIL,MOTRIN) 200 MG tablet Take 200 mg by mouth every 4 (four) hours as needed for headache or mild pain.    Yes Historical Provider, MD  mirtazapine (REMERON) 15 MG tablet Take 22.5 mg by mouth at bedtime.    Yes Historical Provider, MD  ondansetron (ZOFRAN) 8 MG tablet Take 8 mg by mouth 2 (two) times daily as needed for nausea or vomiting.    Yes Historical Provider, MD  traZODone (DESYREL) 50 MG tablet Take 50 mg by mouth at bedtime.   Yes Historical Provider, MD    Positive ROS: All other systems  have been reviewed and were otherwise negative with the exception of those mentioned in the HPI and as above.  Physical Exam: General: Alert and alert in no acute distress. HEENT: Atraumatic and normocephalic. Sclera are clear. Extraocular motion is intact. Oropharynx is clear with moist mucosa. Neck: Supple, nontender, good range of motion. No JVD or carotid bruits. Lungs: Clear to auscultation bilaterally. Cardiovascular: Regular rate and rhythm with normal S1 and S2. No murmurs. No gallops or rubs. Pedal pulses are palpable bilaterally. Homans test is negative bilaterally. No significant pretibial or ankle edema. Abdomen: Soft, nontender, and nondistended. Bowel sounds are present. Skin: Incision is well-healed. The lateral right calf wound from the previous surgery and has healthy granulating tissue at the base. No drainage or surrounding erythema. Neurologic: Awake, alert, and oriented. Sensory function is grossly intact. Motor strength is felt to be 5 over 5 bilaterally. No clonus or tremor. Good motor coordination. Lymphatic: No axillary or cervical lymphadenopathy MUSCULOSKELETAL: Significant pain with gentle right hip range of motion. No knee effusion. No deformity. The surgical incision about the lateral right hip is healed well. No swelling. Mild tenderness of the buttock.  Imaging: AP pelvis with AP and lateral of the right hip with cross table lateral are reviewed. There is a metal-on-metal hip arthroplasty with SROM femoral component. The acetabular press-fit component is well-seated. No screw fixation. The femoral component shows no signs of loosening however there is a single area in the greater trochanter which may represent osteolysis.  Laboratory Values: WBC 22.0 ESR 33 CRP 15.5  Assessment: 59 year old female now 9 years status post right total hip arthroplasty [facility], Dr. Marlou Sa.  She presents with worsening right hip pain, fever, and elevated inflammatory  markers. Urinalysis was negative with a normal chest x-ray. She has mild symptoms of dysuria but no shortness of breath. Given the history of the infected right lateral calf wound recently, my suspicion at this time is for a deep periprosthetic infection. This is not completely fit with the pop that she felt yesterday while cleaning her vacuum.   Plan: At this time I would recommend aspiration of the right for fluid analysis to rule out deep infection. Dr. Cinda Quest spoke with Dr. Ninfa Linden of [facility] in my presence.  Transfer to [facility] will be arranged.   Christina Brahm K. Tywan Siever, MD  11/23/2014  21:51

## 2014-11-23 NOTE — ED Notes (Signed)
Pt here with rt hip pain, pt states that she was seated [place] style last night changing the vacuum bag after vaccuming, pt states that she has had dry heaves and felt like she had a fever throughout the night as well as the rt hip pain, pt states that she is hardly able to put weight on the rt leg

## 2014-11-23 NOTE — ED Notes (Signed)
Spoke with Santiago Glad, Agricultural consultant at [employer] ED.

## 2014-11-23 NOTE — ED Provider Notes (Signed)
Dr. Ninfa Linden orthopedics from [facility] called back and is aware of the patient. We will transfer to [facility] as planned  Nena Polio, MD 11/23/14 2141

## 2014-11-23 NOTE — ED Provider Notes (Addendum)
Patient reports she's had a headache for several days her frontal sinuses are painful she there is some tenderness on percussion of her sinuses. Her fevers going back up. She also has a lot of pain and swelling over the mid portion of the incision in her right hip prosthesis. Ears no redness there however. While waiting for the sedimentation rate and CRP and alkaline phosphatase to come back I will go ahead and get a head CT and CT what's going on with the patient sinuses. Patient continues to run 100 203 fever.  Nena Polio, MD 11/23/14 [phone]  Will transfer patient to [facility] your Dr. Noemi Chapel accepts to Er.  Nena Polio, MD 11/23/14 2126

## 2014-11-23 NOTE — ED Notes (Signed)
Spoke with Thurmond Butts, EMT ([facility]).

## 2014-11-23 NOTE — ED Provider Notes (Signed)
[facility] Emergency Department Provider Note  ____________________________________________  Time seen: 1205  I have reviewed the triage vital signs and the nursing notes.   HISTORY  Chief Complaint Hip Pain  right side    HPI Christina LORETTO is a 59 y.o. female who had a hip replacement in her right hip approximately 10 years ago. Last night, she was cleaning out her vacuum cleaner and was sitting "[place] style". She felt a pop in her hip and has ongoing pain in the right hip since. It hurts more with movement. She is having some difficult ambulating.  In addition to the hip pain, following the acute onset of pain she also thought she might have a fever. She had some chills and nausea. She has had nausea on and off since then but none now.  She did take some Tylenol for the pain and feels that that is under control.  She has a notable fever on arrival at 103.  She does report having a headache for 2 days. She describes this as a "sinus" headache.    Past Medical History  Diagnosis Date  . Hypertension   . Anemia   . Anxiety   . Depression   . Seizures     rare - last one 07/30/14 - was very anemic  . GERD (gastroesophageal reflux disease)   . IBS (irritable bowel syndrome)   . Barrett esophagus   . Vertigo     thinks related to sinus issues    There are no active problems to display for this patient.   Past Surgical History  Procedure Laterality Date  . Abdominal hysterectomy    . Hemorrhoid surgery    . Joint replacement Right 2007    hip  . Cyst excision  2011    from throat  . Cesarean section    . Colonoscopy N/A 08/31/2014    Procedure: COLONOSCOPY;  Surgeon: Lucilla Lame, MD;  Location: [place];  Service: Gastroenterology;  Laterality: N/A;  . Esophagogastroduodenoscopy N/A 08/31/2014    Procedure: ESOPHAGOGASTRODUODENOSCOPY (EGD);  Surgeon: Lucilla Lame, MD;  Location: [place];  Service: Gastroenterology;   Laterality: N/A;    Current Outpatient Rx  Name  Route  Sig  Dispense  Refill  . ALPRAZolam (XANAX) 1 MG tablet   Oral   Take 1 mg by mouth 2 (two) times daily.         Marland Kitchen atenolol (TENORMIN) 50 MG tablet   Oral   Take 50 mg by mouth 2 (two) times daily.         . carbamazepine (TEGRETOL) 200 MG tablet   Oral   Take 600 mg by mouth daily.          Marland Kitchen dexlansoprazole (DEXILANT) 60 MG capsule   Oral   Take 60 mg by mouth daily. AM         . escitalopram (LEXAPRO) 20 MG tablet   Oral   Take 20 mg by mouth daily. PM         . ferrous fumarate (HEMOCYTE - 106 MG FE) 325 (106 FE) MG TABS tablet   Oral   Take 1 tablet by mouth daily.         . hyoscyamine (LEVBID) 0.375 MG 12 hr tablet   Oral   Take 0.375 mg by mouth every 12 (twelve) hours as needed.         . mirtazapine (REMERON) 15 MG tablet   Oral   Take  30 mg by mouth at bedtime.          . traZODone (DESYREL) 50 MG tablet   Oral   Take 50 mg by mouth at bedtime.         . hyoscyamine (LEVSIN, ANASPAZ) 0.125 MG tablet   Oral   Take 1 tablet (0.125 mg total) by mouth 4 (four) times daily as needed.   30 tablet   0   . ibuprofen (ADVIL,MOTRIN) 200 MG tablet   Oral   Take 200 mg by mouth as needed.         . ondansetron (ZOFRAN) 8 MG tablet   Oral   Take by mouth 2 (two) times daily as needed for nausea or vomiting.           Allergies Review of patient's allergies indicates no known allergies.  No family history on file.  Social History History  Substance Use Topics  . Smoking status: Never Smoker   . Smokeless tobacco: Not on file  . Alcohol Use: No    Review of Systems  Constitutional: Positive for subjective fever. ENT: Negative for sore throat. Cardiovascular: Negative for chest pain. Respiratory: Negative for shortness of breath. Gastrointestinal: Negative for abdominal pain, vomiting and diarrhea. Genitourinary: Negative for dysuria. Musculoskeletal: Pain right hip.  See history of present illness. Skin: Negative for rash. Neurological: Headache for 2 days.   10-point ROS otherwise negative.  ____________________________________________   PHYSICAL EXAM:  VITAL SIGNS: ED Triage Vitals  Enc Vitals Group     BP 11/23/14 1055 128/84 mmHg     Pulse Rate 11/23/14 1055 108     Resp 11/23/14 1055 20     Temp 11/23/14 1055 103.1 F (39.5 C)     Temp Source 11/23/14 1055 Oral     SpO2 11/23/14 1055 97 %     Weight 11/23/14 1055 200 lb (90.719 kg)     Height 11/23/14 1055 5\' 1"  (1.549 m)     Head Cir --      Peak Flow --      Pain Score 11/23/14 1055 9     Pain Loc --      Pain Edu? --      Excl. in [place]? --     Constitutional: Alert and oriented. She is in no acute distress but appears mildly fatigued. ENT   Head: Normocephalic and atraumatic.   Nose: No congestion/rhinnorhea.   Mouth/Throat: Mucous membranes are moist. Cervical: Nontender, no meningismus. Cardiovascular: Normal rate, regular rhythm, no murmur noted Respiratory:  Normal respiratory effort, no tachypnea.    Breath sounds are clear and equal bilaterally.  Gastrointestinal: Soft and nontender. No distention.  Back: No muscle spasm, no tenderness, no CVA tenderness. Musculoskeletal: No deformity noted. Nontender with normal range of motion in all extremities.  No noted edema. Neurologic:  Normal speech and language. No gross focal neurologic deficits are appreciated.  Skin:  Skin is warm, dry. She has various areas with dermatologic lesions, including an area on her left hand. There is no noted cellulitis. Psychiatric: Mood and affect are normal. Speech and behavior are normal.  ____________________________________________    LABS (pertinent positives/negatives)  Labs Reviewed  CBC WITH DIFFERENTIAL/PLATELET - Abnormal; Notable for the following:    WBC 22.0 (*)    Hemoglobin 11.6 (*)    HCT 34.2 (*)    RDW 17.8 (*)    Neutro Abs 19.2 (*)    Lymphs Abs 0.8 (*)     Monocytes Absolute  1.9 (*)    All other components within normal limits  BASIC METABOLIC PANEL - Abnormal; Notable for the following:    Sodium 127 (*)    Chloride 90 (*)    Glucose, Bld 167 (*)    Creatinine, Ser 1.22 (*)    Calcium 8.7 (*)    GFR calc non Af Amer 48 (*)    GFR calc Af Amer 56 (*)    All other components within normal limits  CULTURE, BLOOD (ROUTINE X 2)  CULTURE, BLOOD (ROUTINE X 2)  URINALYSIS COMPLETEWITH MICROSCOPIC ([facility] ONLY)  C-REACTIVE PROTEIN  HEPATIC FUNCTION PANEL  SEDIMENTATION RATE     ____________________________________________  ____________________________________________    RADIOLOGY  Right hip: Status post right hip arthroplasty. No acute abnormality seen.  Chest 2 view: No acute cardiopulmonary process.  Madaline Guthrie, have directly viewed these films myself.  ____________________________________________  ____________________________________________   INITIAL IMPRESSION / ASSESSMENT AND PLAN / ED COURSE  Pertinent labs & imaging results that were available during my care of the patient were reviewed by me and considered in my medical decision making (see chart for details).  Patient with right hip pain. The x-ray is normal with a prosthesis without dislocation or any apparent dislodgment.  My greater concern currently is the temperature to 103.1 that she presented with in the emergency department. We obtained a chest x-ray which is normal. She does have an elevated white blood cell count at 22,000. A urinalysis is pending. A CRP is also pending to help gauge if this elevated white blood cell count and fever is due to an inflammatory process, although it will certainly have low specificity.  At this time, 1525, I am asking my colleague Dr. Cinda Quest to assume care of the patient.   ____________________________________________   FINAL CLINICAL IMPRESSION(S) / ED DIAGNOSES  Final diagnoses:  Other specified fever  Right  hip pain  Leukocytosis      Ahmed Prima, MD 11/23/14 [phone]

## 2014-11-24 ENCOUNTER — Encounter (HOSPITAL_COMMUNITY): Payer: Self-pay

## 2014-11-24 ENCOUNTER — Inpatient Hospital Stay (HOSPITAL_COMMUNITY): Payer: Medicare Other

## 2014-11-24 DIAGNOSIS — Y831 Surgical operation with implant of artificial internal device as the cause of abnormal reaction of the patient, or of later complication, without mention of misadventure at the time of the procedure: Secondary | ICD-10-CM | POA: Diagnosis present

## 2014-11-24 DIAGNOSIS — K589 Irritable bowel syndrome without diarrhea: Secondary | ICD-10-CM | POA: Diagnosis not present

## 2014-11-24 DIAGNOSIS — Z6837 Body mass index (BMI) 37.0-37.9, adult: Secondary | ICD-10-CM | POA: Diagnosis not present

## 2014-11-24 DIAGNOSIS — R609 Edema, unspecified: Secondary | ICD-10-CM

## 2014-11-24 DIAGNOSIS — T8450XA Infection and inflammatory reaction due to unspecified internal joint prosthesis, initial encounter: Secondary | ICD-10-CM | POA: Diagnosis not present

## 2014-11-24 DIAGNOSIS — E222 Syndrome of inappropriate secretion of antidiuretic hormone: Secondary | ICD-10-CM | POA: Diagnosis not present

## 2014-11-24 DIAGNOSIS — B9561 Methicillin susceptible Staphylococcus aureus infection as the cause of diseases classified elsewhere: Secondary | ICD-10-CM | POA: Diagnosis not present

## 2014-11-24 DIAGNOSIS — Z96641 Presence of right artificial hip joint: Secondary | ICD-10-CM | POA: Diagnosis not present

## 2014-11-24 DIAGNOSIS — I10 Essential (primary) hypertension: Secondary | ICD-10-CM | POA: Diagnosis not present

## 2014-11-24 DIAGNOSIS — Z9889 Other specified postprocedural states: Secondary | ICD-10-CM | POA: Diagnosis not present

## 2014-11-24 DIAGNOSIS — B999 Unspecified infectious disease: Secondary | ICD-10-CM | POA: Diagnosis not present

## 2014-11-24 DIAGNOSIS — F419 Anxiety disorder, unspecified: Secondary | ICD-10-CM | POA: Diagnosis present

## 2014-11-24 DIAGNOSIS — K219 Gastro-esophageal reflux disease without esophagitis: Secondary | ICD-10-CM | POA: Diagnosis not present

## 2014-11-24 DIAGNOSIS — E871 Hypo-osmolality and hyponatremia: Secondary | ICD-10-CM | POA: Diagnosis present

## 2014-11-24 DIAGNOSIS — R05 Cough: Secondary | ICD-10-CM | POA: Diagnosis not present

## 2014-11-24 DIAGNOSIS — Z85820 Personal history of malignant melanoma of skin: Secondary | ICD-10-CM | POA: Diagnosis not present

## 2014-11-24 DIAGNOSIS — M25551 Pain in right hip: Secondary | ICD-10-CM | POA: Diagnosis present

## 2014-11-24 DIAGNOSIS — Z113 Encounter for screening for infections with a predominantly sexual mode of transmission: Secondary | ICD-10-CM | POA: Diagnosis not present

## 2014-11-24 DIAGNOSIS — A419 Sepsis, unspecified organism: Secondary | ICD-10-CM | POA: Diagnosis not present

## 2014-11-24 DIAGNOSIS — D62 Acute posthemorrhagic anemia: Secondary | ICD-10-CM | POA: Diagnosis not present

## 2014-11-24 DIAGNOSIS — T8451XA Infection and inflammatory reaction due to internal right hip prosthesis, initial encounter: Secondary | ICD-10-CM | POA: Diagnosis not present

## 2014-11-24 DIAGNOSIS — Z471 Aftercare following joint replacement surgery: Secondary | ICD-10-CM | POA: Diagnosis not present

## 2014-11-24 DIAGNOSIS — Y838 Other surgical procedures as the cause of abnormal reaction of the patient, or of later complication, without mention of misadventure at the time of the procedure: Secondary | ICD-10-CM | POA: Diagnosis not present

## 2014-11-24 DIAGNOSIS — D649 Anemia, unspecified: Secondary | ICD-10-CM | POA: Diagnosis not present

## 2014-11-24 DIAGNOSIS — D367 Benign neoplasm of other specified sites: Secondary | ICD-10-CM | POA: Diagnosis not present

## 2014-11-24 DIAGNOSIS — A047 Enterocolitis due to Clostridium difficile: Secondary | ICD-10-CM | POA: Diagnosis not present

## 2014-11-24 DIAGNOSIS — G40909 Epilepsy, unspecified, not intractable, without status epilepticus: Secondary | ICD-10-CM | POA: Diagnosis not present

## 2014-11-24 DIAGNOSIS — G459 Transient cerebral ischemic attack, unspecified: Secondary | ICD-10-CM | POA: Diagnosis not present

## 2014-11-24 DIAGNOSIS — T8459XS Infection and inflammatory reaction due to other internal joint prosthesis, sequela: Secondary | ICD-10-CM | POA: Diagnosis not present

## 2014-11-24 DIAGNOSIS — T8459XD Infection and inflammatory reaction due to other internal joint prosthesis, subsequent encounter: Secondary | ICD-10-CM | POA: Diagnosis not present

## 2014-11-24 DIAGNOSIS — F418 Other specified anxiety disorders: Secondary | ICD-10-CM | POA: Diagnosis present

## 2014-11-24 DIAGNOSIS — E663 Overweight: Secondary | ICD-10-CM | POA: Diagnosis present

## 2014-11-24 DIAGNOSIS — F329 Major depressive disorder, single episode, unspecified: Secondary | ICD-10-CM | POA: Diagnosis present

## 2014-11-24 DIAGNOSIS — M009 Pyogenic arthritis, unspecified: Secondary | ICD-10-CM | POA: Diagnosis not present

## 2014-11-24 DIAGNOSIS — T84030A Mechanical loosening of internal right hip prosthetic joint, initial encounter: Secondary | ICD-10-CM | POA: Diagnosis present

## 2014-11-24 DIAGNOSIS — E876 Hypokalemia: Secondary | ICD-10-CM | POA: Diagnosis not present

## 2014-11-24 LAB — PROTIME-INR
INR: 1.4 (ref 0.00–1.49)
Prothrombin Time: 17.3 seconds — ABNORMAL HIGH (ref 11.6–15.2)

## 2014-11-24 LAB — LACTIC ACID, PLASMA
LACTIC ACID, VENOUS: 2.5 mmol/L — AB (ref 0.5–2.0)
Lactic Acid, Venous: 1.5 mmol/L (ref 0.5–2.0)

## 2014-11-24 LAB — BASIC METABOLIC PANEL
Anion gap: 10 (ref 5–15)
BUN: 12 mg/dL (ref 6–20)
CHLORIDE: 89 mmol/L — AB (ref 101–111)
CO2: 25 mmol/L (ref 22–32)
Calcium: 8 mg/dL — ABNORMAL LOW (ref 8.9–10.3)
Creatinine, Ser: 1.14 mg/dL — ABNORMAL HIGH (ref 0.44–1.00)
GFR calc non Af Amer: 52 mL/min — ABNORMAL LOW (ref >60)
GLUCOSE: 143 mg/dL — AB (ref 65–99)
Potassium: 3.4 mmol/L — ABNORMAL LOW (ref 3.5–5.1)
Sodium: 124 mmol/L — ABNORMAL LOW (ref 135–145)

## 2014-11-24 LAB — PROCALCITONIN: PROCALCITONIN: 0.93 ng/mL

## 2014-11-24 LAB — CBC WITH DIFFERENTIAL/PLATELET
BASOS ABS: 0 10*3/uL (ref 0.0–0.1)
BASOS PCT: 0 % (ref 0–1)
EOS PCT: 0 % (ref 0–5)
Eosinophils Absolute: 0 10*3/uL (ref 0.0–0.7)
HCT: 34 % — ABNORMAL LOW (ref 36.0–46.0)
Hemoglobin: 11.5 g/dL — ABNORMAL LOW (ref 12.0–15.0)
LYMPHS PCT: 3 % — AB (ref 12–46)
Lymphs Abs: 0.7 10*3/uL (ref 0.7–4.0)
MCH: 28.1 pg (ref 26.0–34.0)
MCHC: 33.8 g/dL (ref 30.0–36.0)
MCV: 83.1 fL (ref 78.0–100.0)
Monocytes Absolute: 1.8 10*3/uL — ABNORMAL HIGH (ref 0.1–1.0)
Monocytes Relative: 8 % (ref 3–12)
NEUTROS PCT: 89 % — AB (ref 43–77)
Neutro Abs: 18.6 10*3/uL — ABNORMAL HIGH (ref 1.7–7.7)
PLATELETS: 150 10*3/uL (ref 150–400)
RBC: 4.09 MIL/uL (ref 3.87–5.11)
RDW: 15.7 % — ABNORMAL HIGH (ref 11.5–15.5)
WBC: 21.1 10*3/uL — AB (ref 4.0–10.5)

## 2014-11-24 LAB — COMPREHENSIVE METABOLIC PANEL
ALK PHOS: 62 U/L (ref 38–126)
ALT: 17 U/L (ref 14–54)
ANION GAP: 12 (ref 5–15)
AST: 26 U/L (ref 15–41)
Albumin: 3.6 g/dL (ref 3.5–5.0)
BILIRUBIN TOTAL: 0.9 mg/dL (ref 0.3–1.2)
BUN: 10 mg/dL (ref 6–20)
CALCIUM: 8.4 mg/dL — AB (ref 8.9–10.3)
CO2: 24 mmol/L (ref 22–32)
CREATININE: 1.08 mg/dL — AB (ref 0.44–1.00)
Chloride: 87 mmol/L — ABNORMAL LOW (ref 101–111)
GFR calc Af Amer: >60 mL/min (ref >60)
GFR calc non Af Amer: 56 mL/min — ABNORMAL LOW (ref >60)
GLUCOSE: 167 mg/dL — AB (ref 65–99)
Potassium: 3.5 mmol/L (ref 3.5–5.1)
Sodium: 123 mmol/L — ABNORMAL LOW (ref 135–145)
Total Protein: 7.3 g/dL (ref 6.5–8.1)

## 2014-11-24 LAB — CBC
HCT: 30.3 % — ABNORMAL LOW (ref 36.0–46.0)
Hemoglobin: 10.3 g/dL — ABNORMAL LOW (ref 12.0–15.0)
MCH: 27.7 pg (ref 26.0–34.0)
MCHC: 34 g/dL (ref 30.0–36.0)
MCV: 81.5 fL (ref 78.0–100.0)
Platelets: 123 10*3/uL — ABNORMAL LOW (ref 150–400)
RBC: 3.72 MIL/uL — AB (ref 3.87–5.11)
RDW: 16 % — ABNORMAL HIGH (ref 11.5–15.5)
WBC: 15.8 10*3/uL — ABNORMAL HIGH (ref 4.0–10.5)

## 2014-11-24 LAB — APTT: APTT: 34 s (ref 24–37)

## 2014-11-24 LAB — SODIUM, URINE, RANDOM: Sodium, Ur: 46 mmol/L

## 2014-11-24 LAB — OSMOLALITY: Osmolality: 261 mOsm/kg — ABNORMAL LOW (ref 275–300)

## 2014-11-24 LAB — OSMOLALITY, URINE: OSMOLALITY UR: 613 mosm/kg (ref 390–1090)

## 2014-11-24 MED ORDER — KETOROLAC TROMETHAMINE 15 MG/ML IJ SOLN
7.5000 mg | Freq: Four times a day (QID) | INTRAMUSCULAR | Status: AC
  Administered 2014-11-24: 7.5 mg via INTRAVENOUS
  Filled 2014-11-24: qty 1

## 2014-11-24 MED ORDER — CARBAMAZEPINE 200 MG PO TABS
300.0000 mg | ORAL_TABLET | Freq: Two times a day (BID) | ORAL | Status: DC
  Administered 2014-11-24 – 2014-12-04 (×21): 300 mg via ORAL
  Filled 2014-11-24 (×21): qty 2

## 2014-11-24 MED ORDER — PROMETHAZINE HCL 25 MG/ML IJ SOLN
12.5000 mg | Freq: Four times a day (QID) | INTRAMUSCULAR | Status: DC | PRN
  Administered 2014-11-24 – 2014-12-02 (×3): 12.5 mg via INTRAVENOUS
  Filled 2014-11-24 (×3): qty 1

## 2014-11-24 MED ORDER — TRAZODONE HCL 50 MG PO TABS
50.0000 mg | ORAL_TABLET | Freq: Every day | ORAL | Status: DC
  Administered 2014-11-24 – 2014-12-03 (×11): 50 mg via ORAL
  Filled 2014-11-24 (×11): qty 1

## 2014-11-24 MED ORDER — ONDANSETRON HCL 4 MG/2ML IJ SOLN
4.0000 mg | Freq: Four times a day (QID) | INTRAMUSCULAR | Status: DC | PRN
  Administered 2014-11-24 – 2014-11-25 (×3): 4 mg via INTRAVENOUS
  Filled 2014-11-24 (×3): qty 2

## 2014-11-24 MED ORDER — SODIUM CHLORIDE 0.9 % IV SOLN
INTRAVENOUS | Status: DC
  Administered 2014-11-24: 75 mL/h via INTRAVENOUS

## 2014-11-24 MED ORDER — IOHEXOL 300 MG/ML  SOLN
80.0000 mL | Freq: Once | INTRAMUSCULAR | Status: AC | PRN
  Administered 2014-11-24: 75 mL via INTRAVENOUS

## 2014-11-24 MED ORDER — PIPERACILLIN-TAZOBACTAM 3.375 G IVPB
3.3750 g | Freq: Three times a day (TID) | INTRAVENOUS | Status: DC
  Administered 2014-11-24 – 2014-11-25 (×2): 3.375 g via INTRAVENOUS
  Filled 2014-11-24 (×5): qty 50

## 2014-11-24 MED ORDER — METHOCARBAMOL 500 MG PO TABS
500.0000 mg | ORAL_TABLET | Freq: Four times a day (QID) | ORAL | Status: DC | PRN
  Administered 2014-11-27 – 2014-12-04 (×9): 500 mg via ORAL
  Filled 2014-11-24 (×9): qty 1

## 2014-11-24 MED ORDER — ALPRAZOLAM 0.5 MG PO TABS
1.0000 mg | ORAL_TABLET | Freq: Two times a day (BID) | ORAL | Status: DC | PRN
  Administered 2014-11-28: 1 mg via ORAL
  Administered 2014-12-02: 0.5 mg via ORAL
  Filled 2014-11-24 (×2): qty 2

## 2014-11-24 MED ORDER — MIRTAZAPINE 15 MG PO TABS
22.5000 mg | ORAL_TABLET | Freq: Every day | ORAL | Status: DC
  Administered 2014-11-24 – 2014-12-03 (×11): 22.5 mg via ORAL
  Filled 2014-11-24 (×11): qty 2

## 2014-11-24 MED ORDER — IOHEXOL 180 MG/ML  SOLN: 20.0000 mL | Freq: Once | INTRAMUSCULAR | Status: AC | PRN

## 2014-11-24 MED ORDER — HYDROMORPHONE HCL 1 MG/ML IJ SOLN
0.5000 mg | INTRAMUSCULAR | Status: DC | PRN
  Administered 2014-11-24 – 2014-11-26 (×4): 0.5 mg via INTRAVENOUS
  Filled 2014-11-24 (×4): qty 1

## 2014-11-24 MED ORDER — ESCITALOPRAM OXALATE 10 MG PO TABS
20.0000 mg | ORAL_TABLET | Freq: Every day | ORAL | Status: DC
  Administered 2014-11-24 – 2014-12-04 (×11): 20 mg via ORAL
  Filled 2014-11-24 (×10): qty 2

## 2014-11-24 MED ORDER — IBUPROFEN 200 MG PO TABS
800.0000 mg | ORAL_TABLET | Freq: Once | ORAL | Status: AC
  Administered 2014-11-24: 800 mg via ORAL
  Filled 2014-11-24: qty 4

## 2014-11-24 MED ORDER — ENOXAPARIN SODIUM 30 MG/0.3ML ~~LOC~~ SOLN: 30.0000 mg | Freq: Two times a day (BID) | SUBCUTANEOUS | Status: DC

## 2014-11-24 MED ORDER — RISAQUAD PO CAPS
1.0000 | ORAL_CAPSULE | Freq: Every day | ORAL | Status: DC
  Administered 2014-11-24 – 2014-11-29 (×6): 1 via ORAL
  Filled 2014-11-24 (×7): qty 1

## 2014-11-24 MED ORDER — VANCOMYCIN HCL IN DEXTROSE 750-5 MG/150ML-% IV SOLN
750.0000 mg | Freq: Two times a day (BID) | INTRAVENOUS | Status: DC
  Administered 2014-11-24 – 2014-11-25 (×3): 750 mg via INTRAVENOUS
  Filled 2014-11-24 (×6): qty 150

## 2014-11-24 MED ORDER — DIPHENHYDRAMINE HCL 50 MG/ML IJ SOLN: 25.0000 mg | Freq: Four times a day (QID) | INTRAMUSCULAR | Status: DC | PRN

## 2014-11-24 MED ORDER — PIPERACILLIN-TAZOBACTAM 3.375 G IVPB 30 MIN
3.3750 g | Freq: Once | INTRAVENOUS | Status: AC
  Administered 2014-11-24: 3.375 g via INTRAVENOUS
  Filled 2014-11-24 (×2): qty 50

## 2014-11-24 MED ORDER — SODIUM CHLORIDE 0.9 % IV BOLUS (SEPSIS)
1000.0000 mL | Freq: Once | INTRAVENOUS | Status: AC
  Administered 2014-11-24: 1000 mL via INTRAVENOUS

## 2014-11-24 MED ORDER — POTASSIUM CHLORIDE CRYS ER 20 MEQ PO TBCR
40.0000 meq | EXTENDED_RELEASE_TABLET | Freq: Two times a day (BID) | ORAL | Status: DC
  Administered 2014-11-24: 40 meq via ORAL
  Filled 2014-11-24: qty 2

## 2014-11-24 MED ORDER — ATENOLOL 50 MG PO TABS
50.0000 mg | ORAL_TABLET | Freq: Two times a day (BID) | ORAL | Status: DC
  Administered 2014-11-24 – 2014-12-04 (×21): 50 mg via ORAL
  Filled 2014-11-24 (×21): qty 1

## 2014-11-24 MED ORDER — PANTOPRAZOLE SODIUM 40 MG PO TBEC
40.0000 mg | DELAYED_RELEASE_TABLET | Freq: Every day | ORAL | Status: DC
  Administered 2014-11-24 – 2014-11-28 (×5): 40 mg via ORAL
  Filled 2014-11-24 (×5): qty 1

## 2014-11-24 MED ORDER — OXYCODONE HCL 5 MG PO TABS
5.0000 mg | ORAL_TABLET | ORAL | Status: DC | PRN
  Administered 2014-11-26 – 2014-12-04 (×21): 5 mg via ORAL
  Filled 2014-11-24 (×22): qty 1

## 2014-11-24 NOTE — Consult Note (Signed)
[facility] Medical Consultation  Christina French QZR:[id number] DOB: August 04, 1955 DOA: 11/23/2014 PCP: [name], MD   Requesting physician: Dr. Ninfa Linden of Orthopedic Surgery Date of consultation: 11/24/2014 Reason for consultation: Sepsis  Impression/Recommendations Principal Problem:   Acute pain of right hip, right calf pain, ?sepsis Active Problems:   Right hip pain   Hyponatremia   BP (high blood pressure)   Sepsis (fever 103.3, tachycardia, elevated WBC) "Sepsis like" presentation without obvious cause. Based on severe pain and serologies, [facility] felt she had sepsis secondary to infected hardware in her hip. The patient has been feeling sick for approximately 1 week, but developed hip pain acutely 2 days ago after hearing a pop in the same hip.  In order to avoid any unnecessary surgery, Dr. Ninfa Linden consulted [facility] to rule out other sources of sepsis.  Will continue IV fluid recesscitation giving a liter bolus in addition to her current IVF at 100/hour. Blood cultures and U/A obtained at [place] show no infection thus far.   CXR is negative for infection. Patient has no positive meningeal signs on exam. Doppler U/S of her right lower extremity shows no DVT. We will continue Vanc/Zosyn previously started.  Initiate full sepsis work up including: repeat blood cultures, culture urine, HIV, lactic acid, pro calcitonin, CT Chest.  Right Hip Pain in the setting of previous hip replacement Possibly infected joint, but this is becoming more and more doubtful.  Question other musculoskeletal abnormalities. Will defer to Ortho Surgery for management.  Hyponatremia This appears chronic and is likely due to her tegretol and psychotropic medications.  Patient is euvolemic. Will check urine na and urine osmols, serum na and serum osmols to rule out SIADH.  HTN Continue atenolol  Seizure Disorder Continue carbamazepine  Depression & Anxiety Continue xanax, lexapro,  remeron, trazodone   [facility] will followup again tomorrow. Please contact me if I can be of assistance in the meanwhile. Thank you for this consultation.  Chief Complaint: Right hip pain  HPI:  Christina French is a 68 yof with htn, hld, and seizure disorder who presented to [facility] on 7/29 with severe right hip pain.  She states that about a week ago she started feels as though she were about to "come out of her skin", she developed decreased appetite, nausea, and headache.  She complains of a non productive cough that started two months ago, and reports that her urine has changed color recently.  She has had subjective fever over the past week.  On 7/29 she was sitting "[place] style" with crossed legs on the floor.  When she attempted to move, she heard a "pop" in her right hip and developed severe pain.  When the pain did not improve she went to [facility] regional.  The patient had had replacement surgery in her right hip approximately 10 years ago.  [facility] found a WBC of 22K, CRP of 15.5, and a temperature of 103.3.  Out of concern for an infected joint, they transferred Christina French to [facility] under the care of Orthopedic Surgery.  Dr. Ninfa Linden attempted to aspirate the joint last evening, but no fluid was found.  Interventional radiology attempted fluoro guided aspiration 7/30, but again no fluid was found.  Xrays of the joint show no acute abnormality.  Currently the patient continues to have severe right hip pain with movement.  Her temp is 99.4.  Sodium 124, potassium 3.4.  WBC has declined to 15.8 after being placed on Vanc / Zosyn overnight.  Review of Systems:  ++Headache, Agitation, concentrated urine ++non productive cough x 2 months.decreased appetite Denies:  Changes in vision, sore throat, dysphagia Denies:  Chest pain,  ++ states she is intermittently SOB. + DOE Denies:  Abdominal pain, states her bowels are normally loose. Denies: pain with urination.  ++ endorses frequency,  order and change in color Denies  Myalgias.   Past Medical History  Diagnosis Date  . Hypertension   . Anemia   . Anxiety   . Depression   . Seizures     rare - last one 07/30/14 - was very anemic  . GERD (gastroesophageal reflux disease)   . IBS (irritable bowel syndrome)   . Barrett esophagus   . Vertigo     thinks related to sinus issues   Past Surgical History  Procedure Laterality Date  . Abdominal hysterectomy    . Hemorrhoid surgery    . Joint replacement Right 2007    hip  . Cyst excision  2011    from throat  . Cesarean section    . Colonoscopy N/A 08/31/2014    Procedure: COLONOSCOPY;  Surgeon: Lucilla Lame, MD;  Location: [place];  Service: Gastroenterology;  Laterality: N/A;  . Esophagogastroduodenoscopy N/A 08/31/2014    Procedure: ESOPHAGOGASTRODUODENOSCOPY (EGD);  Surgeon: Lucilla Lame, MD;  Location: [place];  Service: Gastroenterology;  Laterality: N/A;   Social History:  reports that she has never smoked. She does not have any smokeless tobacco history on file. She reports that she does not drink alcohol. Her drug history is not on file.  Lives alone.  Does not drive.  Family History: Daughter is alive and healthy.    Prior to Admission medications   Medication Sig Start Date End Date Taking? Authorizing Provider  ALPRAZolam Duanne Moron) 1 MG tablet Take 1 mg by mouth 2 (two) times daily as needed for anxiety.    Yes Historical Provider, MD  atenolol (TENORMIN) 50 MG tablet Take 50 mg by mouth 2 (two) times daily.   Yes Historical Provider, MD  carbamazepine (TEGRETOL) 200 MG tablet Take 300 mg by mouth 2 (two) times daily.    Yes Historical Provider, MD  dexlansoprazole (DEXILANT) 60 MG capsule Take 60 mg by mouth daily.    Yes Historical Provider, MD  escitalopram (LEXAPRO) 20 MG tablet Take 20 mg by mouth daily.    Yes Historical Provider, MD  hyoscyamine (LEVSIN, ANASPAZ) 0.125 MG tablet Take 1 tablet (0.125 mg total) by mouth 4 (four)  times daily as needed. Patient taking differently: Take 0.125 mg by mouth 4 (four) times daily as needed for cramping.  10/25/14  Yes Andria Meuse, NP  ibuprofen (ADVIL,MOTRIN) 200 MG tablet Take 200 mg by mouth every 4 (four) hours as needed for headache or mild pain.    Yes Historical Provider, MD  mirtazapine (REMERON) 15 MG tablet Take 22.5 mg by mouth at bedtime.    Yes Historical Provider, MD  ondansetron (ZOFRAN) 8 MG tablet Take 8 mg by mouth 2 (two) times daily as needed for nausea or vomiting.    Yes Historical Provider, MD  traZODone (DESYREL) 50 MG tablet Take 50 mg by mouth at bedtime.   Yes Historical Provider, MD   Physical Exam: Blood pressure 147/74, pulse 106, temperature 99.4 F (37.4 C), temperature source Oral, resp. rate 17, weight 90.719 kg (200 lb), SpO2 98 %. Filed Vitals:   11/24/14 0640  BP: 147/74  Pulse: 106  Temp: 99.4 F (37.4 C)  Resp: 17     General:  Wd, overweight female, flushed appearing, A&O, cooperative.  Eyes: PER, conjunctiva pink  ENT: mmm, no exudates or erythema, no LAD  Neck: supple, thick, able to touch chin to chest without pain.  Cardiovascular: rrr, no m/r/g  Respiratory: CTA, no w/c/r  Abdomen: obese, nt, nd, + bs, no masses  Skin: face is flushed, several small lesions on head.  Annular lesion on right mid calf - appears to be healing.  Musculoskeletal: significant pain with any passive movement of the right leg.  Psychiatric: A&O, Cooperative.  Several times I had to repeat questions 3x in order to get a direct answer.  Neurologic: CN grossly in tact.  Able to move all 4 extremities.  Labs on Admission:  Basic Metabolic Panel:  Recent Labs Lab 11/23/14 1245 11/24/14 0405  NA 127* 124*  K 4.1 3.4*  CL 90* 89*  CO2 25 25  GLUCOSE 167* 143*  BUN 16 12  CREATININE 1.22* 1.14*  CALCIUM 8.7* 8.0*   Liver Function Tests:  Recent Labs Lab 11/23/14 1245  AST 30  ALT 20  ALKPHOS 59  BILITOT 0.8  PROT 7.5   ALBUMIN 4.4   CBC:  Recent Labs Lab 11/23/14 1245 11/24/14 0405  WBC 22.0* 15.8*  NEUTROABS 19.2*  --   HGB 11.6* 10.3*  HCT 34.2* 30.3*  MCV 81.5 81.5  PLT 160 123*     Radiological Exams on Admission: Dg Chest 2 View  11/23/2014   CLINICAL DATA:  Fever  EXAM: CHEST  2 VIEW  COMPARISON:  05/29/2005  FINDINGS: Cardiac enlargement without heart failure. Vascularity is normal. Lungs are clear without infiltrate effusion or mass. No change from the prior study.  IMPRESSION: No active cardiopulmonary disease.   Electronically Signed   By: Franchot Gallo M.D.   On: 11/23/2014 13:11   Dg Fluoro Guide Ndl Plcd/bx/inj/loc  11/24/2014   CLINICAL DATA:  Right hip pain, status post right hip arthroplasty, hip aspiration of possible effusion  EXAM: RIGHT HIP ASPIRATION UNDER FLUOROSCOPY  FLUOROSCOPY TIME:  Fluoroscopy Time (in minutes and seconds): 1 minute 12 seconds  Number of Acquired Images:  2  PROCEDURE: Overlying skin prepped with Betadine, draped in the usual sterile fashion, and infiltrated locally with buffered Lidocaine.  20 gauge spinal needle advanced to the superolateral margin of the right femoral head prosthesis. 1 ml of Lidocaine injected easily. No fluid could be aspirated from the joint.  Repeat attempted at the level of the femoral neck prosthesis. No fluid could be aspirated from the joint.  Study was terminated after consultation with the referring physician.  No immediate complication.  IMPRESSION: Attempted right hip aspiration under fluoroscopy.  No fluid could be aspirated from the joint at the level of the femoral head or neck.   Electronically Signed   By: Julian Hy M.D.   On: 11/24/2014 13:48   Dg Hip Unilat With Pelvis 1v Right  11/23/2014   CLINICAL DATA:  Right hip pain with vomiting and fever.  EXAM: DG HIP (WITH OR WITHOUT PELVIS) 1V RIGHT  COMPARISON:  Earlier today as well as 03/27/2010.  FINDINGS: There are mild degenerative changes of the left hip. Right  hip arthroplasty unchanged. Mild degenerative changes of the spine. Surgical sutures over the pelvis just right of midline.  IMPRESSION: Right hip arthroplasty unchanged. No acute findings. Degenerative change left hip.   Electronically Signed   By: Marin Olp M.D.   On: 11/23/2014 21:43  Dg Hip Unilat With Pelvis 2-3 Views Right  11/23/2014   CLINICAL DATA:  Acute right hip pain.  EXAM: DG HIP (WITH OR WITHOUT PELVIS) 2-3V RIGHT  COMPARISON:  March 27, 2010.  FINDINGS: Status post right hip arthroplasty. No acute fracture or dislocation is noted. Left hip appears normal. Sacroiliac joints appear normal.  IMPRESSION: Status post right hip arthroplasty.  No acute abnormality seen.   Electronically Signed   By: Marijo Conception, M.D.   On: 11/23/2014 12:17   Ct Maxillofacial Wo Cm  11/23/2014   CLINICAL DATA:  Frontal headache for several days.  Febrile.  EXAM: CT MAXILLOFACIAL WITHOUT CONTRAST  TECHNIQUE: Multidetector CT imaging of the maxillofacial structures was performed. Multiplanar CT image reconstructions were also generated. A small metallic BB was placed on the right temple in order to reliably differentiate right from left.  COMPARISON:  None.  FINDINGS: The bones are intact. There is no fracture, bone lesion or bony destruction. There is an air-fluid level in the left sphenoid sinus with no membrane thickening. This was also visible on the MRI of 08/24/2012, and is nonspecific. There is no significant membrane thickening in any of the paranasal sinuses. There is no retention cyst or polyp.  IMPRESSION: Minimal changes with a left sphenoid air-fluid level, but no inflammatory soft tissue thickening. Remainder of the paranasal sinuses are clear.   Electronically Signed   By: Andreas Newport M.D.   On: 11/23/2014 17:47    EKG: pending   Time spent: 50 min  Karen Kitchens [facility] Pager [phone]  If 7PM-7AM, please contact night-coverage [email or web address] Password  [email or web address] 11/24/2014, 2:15 PM

## 2014-11-24 NOTE — ED Provider Notes (Signed)
Patient transferred from [place].  Dr. Ninfa Linden at [facility] to admit.  Merryl Hacker, MD 11/24/14 [phone]

## 2014-11-24 NOTE — H&P (Signed)
Christina French is an 59 y.o. female.   Chief Complaint:   Right hip pain and right calf pain HPI:   59 yo female patient of one of my partners who had a right total hip replacement in 2007.  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  Was sitting down on ground with legs crossed yesterday and felt a pop in her hip when getting up.  Has had significant hip pain since then.  She was at [facility] all day today and did have a temp at one point (103) and a peripheral WBC of 22,000.  The staff there was convinced that the source is her hip, thus they hurriedly wanted her transferred to [facility].  Apparently she did have some type of infection around her calf operative site post-op and was on antibiotics for a while.  In the ED here, she appears very comfortable, is not septic appearing, and is afebrile with stable vitals.  Past Medical History  Diagnosis Date  . Hypertension   . Anemia   . Anxiety   . Depression   . Seizures     rare - last one 07/30/14 - was very anemic  . GERD (gastroesophageal reflux disease)   . IBS (irritable bowel syndrome)   . Barrett esophagus   . Vertigo     thinks related to sinus issues    Past Surgical History  Procedure Laterality Date  . Abdominal hysterectomy    . Hemorrhoid surgery    . Joint replacement Right 2007    hip  . Cyst excision  2011    from throat  . Cesarean section    . Colonoscopy N/A 08/31/2014    Procedure: COLONOSCOPY;  Surgeon: Lucilla Lame, MD;  Location: [place];  Service: Gastroenterology;  Laterality: N/A;  . Esophagogastroduodenoscopy N/A 08/31/2014    Procedure: ESOPHAGOGASTRODUODENOSCOPY (EGD);  Surgeon: Lucilla Lame, MD;  Location: [place];  Service: Gastroenterology;  Laterality: N/A;    No family history on file. Social History:  reports that she has never smoked. She does not have any smokeless tobacco history on file. She reports that she does not drink alcohol.  Her drug history is not on file.  Allergies: No Known Allergies   (Not in a hospital admission)  Results for orders placed or performed during the hospital encounter of 11/23/14 (from the past 48 hour(s))  CBC with Differential     Status: Abnormal   Collection Time: 11/23/14 12:45 PM  Result Value Ref Range   WBC 22.0 (H) 3.6 - 11.0 K/uL   RBC 4.20 3.80 - 5.20 MIL/uL   Hemoglobin 11.6 (L) 12.0 - 16.0 g/dL   HCT 34.2 (L) 35.0 - 47.0 %   MCV 81.5 80.0 - 100.0 fL   MCH 27.7 26.0 - 34.0 pg   MCHC 34.0 32.0 - 36.0 g/dL   RDW 17.8 (H) 11.5 - 14.5 %   Platelets 160 150 - 440 K/uL   Neutrophils Relative % 86 %   Neutro Abs 19.2 (H) 1.4 - 6.5 K/uL   Lymphocytes Relative 4 %   Lymphs Abs 0.8 (L) 1.0 - 3.6 K/uL   Monocytes Relative 9 %   Monocytes Absolute 1.9 (H) 0.2 - 0.9 K/uL   Eosinophils Relative 0 %   Eosinophils Absolute 0.0 0 - 0.7 K/uL   Basophils Relative 1 %   Basophils Absolute 0.1 0 - 0.1 K/uL  Basic metabolic panel  Status: Abnormal   Collection Time: 11/23/14 12:45 PM  Result Value Ref Range   Sodium 127 (L) 135 - 145 mmol/L   Potassium 4.1 3.5 - 5.1 mmol/L   Chloride 90 (L) 101 - 111 mmol/L   CO2 25 22 - 32 mmol/L   Glucose, Bld 167 (H) 65 - 99 mg/dL   BUN 16 6 - 20 mg/dL   Creatinine, Ser 1.22 (H) 0.44 - 1.00 mg/dL   Calcium 8.7 (L) 8.9 - 10.3 mg/dL   GFR calc non Af Amer 48 (L) >60 mL/min   GFR calc Af Amer 56 (L) >60 mL/min    Comment: (NOTE) The eGFR has been calculated using the CKD EPI equation. This calculation has not been validated in all clinical situations. eGFR's persistently <60 mL/min signify possible Chronic Kidney Disease.    Anion gap 12 5 - 15  C-reactive protein     Status: Abnormal   Collection Time: 11/23/14 12:45 PM  Result Value Ref Range   CRP 15.5 (H) <1.0 mg/dL    Comment: Performed at [facility]  Hepatic function panel     Status: None   Collection Time: 11/23/14 12:45 PM  Result Value Ref Range   Total Protein 7.5  6.5 - 8.1 g/dL   Albumin 4.4 3.5 - 5.0 g/dL   AST 30 15 - 41 U/L   ALT 20 14 - 54 U/L   Alkaline Phosphatase 59 38 - 126 U/L   Total Bilirubin 0.8 0.3 - 1.2 mg/dL   Bilirubin, Direct 0.1 0.1 - 0.5 mg/dL   Indirect Bilirubin 0.7 0.3 - 0.9 mg/dL  Sedimentation rate     Status: Abnormal   Collection Time: 11/23/14 12:45 PM  Result Value Ref Range   Sed Rate 33 (H) 0 - 30 mm/hr  Urinalysis complete, with microscopic     Status: Abnormal   Collection Time: 11/23/14  2:14 PM  Result Value Ref Range   Color, Urine AMBER (A) YELLOW   APPearance CLEAR (A) CLEAR   Glucose, UA NEGATIVE NEGATIVE mg/dL   Bilirubin Urine NEGATIVE NEGATIVE   Ketones, ur NEGATIVE NEGATIVE mg/dL   Specific Gravity, Urine 1.023 1.005 - 1.030   Hgb urine dipstick NEGATIVE NEGATIVE   pH 6.0 5.0 - 8.0   Protein, ur 30 (A) NEGATIVE mg/dL   Nitrite NEGATIVE NEGATIVE   Leukocytes, UA NEGATIVE NEGATIVE   RBC / HPF NONE SEEN 0 - 5 RBC/hpf   WBC, UA 0-5 0 - 5 WBC/hpf   Bacteria, UA NONE SEEN NONE SEEN   Squamous Epithelial / LPF 0-5 (A) NONE SEEN   Mucous PRESENT    Hyaline Casts, UA PRESENT    Dg Chest 2 View  11/23/2014   CLINICAL DATA:  Fever  EXAM: CHEST  2 VIEW  COMPARISON:  05/29/2005  FINDINGS: Cardiac enlargement without heart failure. Vascularity is normal. Lungs are clear without infiltrate effusion or mass. No change from the prior study.  IMPRESSION: No active cardiopulmonary disease.   Electronically Signed   By: Franchot Gallo M.D.   On: 11/23/2014 13:11   Dg Hip Unilat With Pelvis 1v Right  11/23/2014   CLINICAL DATA:  Right hip pain with vomiting and fever.  EXAM: DG HIP (WITH OR WITHOUT PELVIS) 1V RIGHT  COMPARISON:  Earlier today as well as 03/27/2010.  FINDINGS: There are mild degenerative changes of the left hip. Right hip arthroplasty unchanged. Mild degenerative changes of the spine. Surgical sutures over the pelvis just right of midline.  IMPRESSION: Right hip arthroplasty unchanged. No acute  findings. Degenerative change left hip.   Electronically Signed   By: Marin Olp M.D.   On: 11/23/2014 21:43   Dg Hip Unilat With Pelvis 2-3 Views Right  11/23/2014   CLINICAL DATA:  Acute right hip pain.  EXAM: DG HIP (WITH OR WITHOUT PELVIS) 2-3V RIGHT  COMPARISON:  March 27, 2010.  FINDINGS: Status post right hip arthroplasty. No acute fracture or dislocation is noted. Left hip appears normal. Sacroiliac joints appear normal.  IMPRESSION: Status post right hip arthroplasty.  No acute abnormality seen.   Electronically Signed   By: Marijo Conception, M.D.   On: 11/23/2014 12:17   Ct Maxillofacial Wo Cm  11/23/2014   CLINICAL DATA:  Frontal headache for several days.  Febrile.  EXAM: CT MAXILLOFACIAL WITHOUT CONTRAST  TECHNIQUE: Multidetector CT imaging of the maxillofacial structures was performed. Multiplanar CT image reconstructions were also generated. A small metallic BB was placed on the right temple in order to reliably differentiate right from left.  COMPARISON:  None.  FINDINGS: The bones are intact. There is no fracture, bone lesion or bony destruction. There is an air-fluid level in the left sphenoid sinus with no membrane thickening. This was also visible on the MRI of 08/24/2012, and is nonspecific. There is no significant membrane thickening in any of the paranasal sinuses. There is no retention cyst or polyp.  IMPRESSION: Minimal changes with a left sphenoid air-fluid level, but no inflammatory soft tissue thickening. Remainder of the paranasal sinuses are clear.   Electronically Signed   By: Andreas Newport M.D.   On: 11/23/2014 17:47    Review of Systems  Constitutional: Positive for fever and chills.  Musculoskeletal: Positive for joint pain.    Blood pressure 117/71, pulse 83, temperature 97.7 F (36.5 C), temperature source Oral, resp. rate 18, weight 90.719 kg (200 lb), SpO2 97 %. Physical Exam  Constitutional: She is oriented to person, place, and time. She appears  well-developed and well-nourished.  HENT:  Head: Normocephalic and atraumatic.  Eyes: EOM are normal. Pupils are equal, round, and reactive to light.  Neck: Normal range of motion. Neck supple.  Cardiovascular: Normal rate and regular rhythm.   Respiratory: Effort normal and breath sounds normal.  GI: Soft.  Neurological: She is alert and oriented to person, place, and time.  Skin: Skin is warm and dry.  Psychiatric: She has a normal mood and affect.    Her right hip has some mild pain on rotation.  Her right hip incision from 9 years ago is well-healed. There is no erythema around her hip incision and it is not warm at all. Her right calf is severely tender to palpation, even more so than the hip.  There is no erythema about her calf wound from where her cancerous lesion was removed    Assessment/Plan High fever (afebrile now), with high WBC and suspicion for a right hip infection; however, possible source of fever may be from her calf and there is suspicion for a DVT 1)  I am not convinced her right hip is infected based on my exam.  I did attempt to aspirate her right hip with a 18-gauge needle thru and anterior approach as well as a separate approach laterally and these were both dry taps. 2)  Will admit her and get a DVT study tomorrow and even try to order a fluoro-guided hip aspiration or the right hip to rule-out and infection.  Unfortunately, the  staff at [facility] ED started her on IV antibiotics, so getting a culture may be suspect. 3)  Will start Lovenox and leave NPO for now 4)  Will likely obtain medical consult tomorrow am ([facility]) to assist with diagnosis and management.  Khaleesi Gruel Y 11/24/2014, 12:26 AM

## 2014-11-24 NOTE — ED Notes (Signed)
Pt transferred from [facility] for ortho consult d/t pain, warmth and redness in right hip and right leg that is a hip replacement

## 2014-11-24 NOTE — Progress Notes (Signed)
VASCULAR LAB PRELIMINARY  PRELIMINARY  PRELIMINARY  PRELIMINARY  Right lower extremity venous duplex completed.    Preliminary report:  There is no DVT or SVT noted in the right lower extremity.   Dade Rodin, RVT 11/24/2014, 11:25 AM

## 2014-11-24 NOTE — ED Notes (Signed)
Attempted report 

## 2014-11-24 NOTE — Progress Notes (Signed)
[facility] - Imogene Burn, [place] - Lab called r/t critical high Lactic acid.

## 2014-11-24 NOTE — Progress Notes (Signed)
Patient ID: Christina French, female   DOB: 1955/07/03, 59 y.o.   MRN: [id number] Still reports significant right hip pain.  Has been afebrile.  WBC still high at 15,000 (down from 22,000).  Denies any calf pain today.  Calf is soft.  Hip hurst on ROM.  No redness around hip.  Patient reports nausea and feeling sick.  Will continue NPO for now.  Will try to obtain a fluoro-guided right hip aspiration to assess if there is any fluid around her right hip joint.  May still need a wash-out of her hip, but will see what aspiration shows first.

## 2014-11-24 NOTE — Progress Notes (Signed)
Patient ID: Christina French, female   DOB: 11/11/1955, 59 y.o.   MRN: [id number] I came by the bedside to examine Christina French at the bedside.  She now states that her right hip pain has completely resolved.  However, she is now experiencing significantly loose stools/diarrhea.  On exam, I can rotate her hip and palpate her hip and she denies any pain with that hip at all.  Will continue to follow and hold on surgery for now.  Will send off C. Diff.

## 2014-11-24 NOTE — Progress Notes (Signed)
Patient ID: Christina French, female   DOB: [birth year], 59 y.o.   MRN: [id number] Fluoro-guided right hip aspiration was negative in terms of not getting any fluid from the joint around the femoral head.  Her pain seems to be more posterior over the trochanter.  I will continue the IV antibiotics for now and let her eat today.  Her DVT screen was also negative.  Will continue to hydrate her and try NASIDs as well today.  Appreciate [facility] consult.  I will re-examine her this evening to determine whether or not to proceed to surgery tomorrow.

## 2014-11-24 NOTE — Progress Notes (Signed)
ANTIBIOTIC CONSULT NOTE - INITIAL  Pharmacy Consult for Zosyn and Vancomycin Indication: sepsis  No Known Allergies  Patient Measurements: Weight: 200 lb (90.719 kg)  Ht: 61 in  Vital Signs: Temp: 98.4 F (36.9 C) (07/30 0153) Temp Source: Oral (07/30 0153) BP: 109/66 mmHg (07/30 0153) Pulse Rate: 78 (07/30 0153) Intake/Output from previous day:   Intake/Output from this shift:    Labs:  Recent Labs  11/23/14 1245  WBC 22.0*  HGB 11.6*  PLT 160  CREATININE 1.22*   Estimated Creatinine Clearance: 52.2 mL/min (by C-G formula based on Cr of 1.22). No results for input(s): VANCOTROUGH, VANCOPEAK, VANCORANDOM, GENTTROUGH, GENTPEAK, GENTRANDOM, TOBRATROUGH, TOBRAPEAK, TOBRARND, AMIKACINPEAK, AMIKACINTROU, AMIKACIN in the last 72 hours.   Microbiology: No results found for this or any previous visit (from the past 720 hour(s)).  Medical History: Past Medical History  Diagnosis Date  . Hypertension   . Anemia   . Anxiety   . Depression   . Seizures     rare - last one 07/30/14 - was very anemic  . GERD (gastroesophageal reflux disease)   . IBS (irritable bowel syndrome)   . Barrett esophagus   . Vertigo     thinks related to sinus issues    Medications:  Prescriptions prior to admission  Medication Sig Dispense Refill Last Dose  . ALPRAZolam (XANAX) 1 MG tablet Take 1 mg by mouth 2 (two) times daily as needed for anxiety.    11/22/2014 at Unknown time  . atenolol (TENORMIN) 50 MG tablet Take 50 mg by mouth 2 (two) times daily.   11/23/2014 at 0800  . carbamazepine (TEGRETOL) 200 MG tablet Take 300 mg by mouth 2 (two) times daily.    11/23/2014 at Unknown time  . dexlansoprazole (DEXILANT) 60 MG capsule Take 60 mg by mouth daily.    11/23/2014 at Unknown time  . escitalopram (LEXAPRO) 20 MG tablet Take 20 mg by mouth daily.    11/23/2014 at Unknown time  . hyoscyamine (LEVSIN, ANASPAZ) 0.125 MG tablet Take 1 tablet (0.125 mg total) by mouth 4 (four) times daily as  needed. (Patient taking differently: Take 0.125 mg by mouth 4 (four) times daily as needed for cramping. ) 30 tablet 0 Past Week at Unknown time  . ibuprofen (ADVIL,MOTRIN) 200 MG tablet Take 200 mg by mouth every 4 (four) hours as needed for headache or mild pain.    Past Month at Unknown time  . mirtazapine (REMERON) 15 MG tablet Take 22.5 mg by mouth at bedtime.    11/22/2014 at Unknown time  . ondansetron (ZOFRAN) 8 MG tablet Take 8 mg by mouth 2 (two) times daily as needed for nausea or vomiting.    Past Month at Unknown time  . traZODone (DESYREL) 50 MG tablet Take 50 mg by mouth at bedtime.   11/22/2014 at Unknown time   Assessment: 59 y.o. female presented to [facility] ED with R hip and calf pain. Pt had cancerous lesion removed from her R calf about a month ago. Rocephin 2gm, po doxy 100mg , and vancomycin 1gm IV given at [place] ~1900. Ortho consulted for possible hip infection and attempted aspiration of L hip and nothing aspirated. WBC up to 22. Tm 103.3, now afebrile. SCr 1.22, est CrCl 50 ml/min. To continue broad spectrum abx (Vancomycin and zosyn) for sepsis (possible source - hip infection).  Goal of Therapy:  Vancomycin trough level 15-20 mcg/ml  Plan:  Vancomycin 750mg  IV q12h Zosyn 3.375gm IV now over 30 min then  3.375gm IV q8h (subsequent doses over 4 hours) Will f/u renal function, micro data, and pt's clinical condition Vanc trough prn  Sherlon Handing, PharmD, BCPS Clinical pharmacist, pager [phone] 11/24/2014,2:00 AM

## 2014-11-25 ENCOUNTER — Inpatient Hospital Stay (HOSPITAL_COMMUNITY): Payer: Medicare Other

## 2014-11-25 ENCOUNTER — Inpatient Hospital Stay (HOSPITAL_COMMUNITY): Payer: Medicare Other | Admitting: Anesthesiology

## 2014-11-25 ENCOUNTER — Encounter (HOSPITAL_COMMUNITY)
Admission: EM | Disposition: A | Discharge status: Skilled Nursing Facility | Payer: Self-pay | Source: Home / Self Care | Attending: Orthopaedic Surgery

## 2014-11-25 DIAGNOSIS — A419 Sepsis, unspecified organism: Principal | ICD-10-CM

## 2014-11-25 DIAGNOSIS — I10 Essential (primary) hypertension: Secondary | ICD-10-CM

## 2014-11-25 DIAGNOSIS — E871 Hypo-osmolality and hyponatremia: Secondary | ICD-10-CM

## 2014-11-25 HISTORY — PX: INCISION AND DRAINAGE HIP: SHX1801

## 2014-11-25 LAB — SYNOVIAL CELL COUNT + DIFF, W/ CRYSTALS
Crystals, Fluid: NONE SEEN
Eosinophils-Synovial: 1 % (ref 0–1)
LYMPHOCYTES-SYNOVIAL FLD: 7 % (ref 0–20)
MONOCYTE-MACROPHAGE-SYNOVIAL FLUID: 5 % — AB (ref 50–90)
NEUTROPHIL, SYNOVIAL: 87 % — AB (ref 0–25)
WBC, Synovial: 215852 /mm3 — ABNORMAL HIGH (ref 0–200)

## 2014-11-25 LAB — BASIC METABOLIC PANEL
ANION GAP: 11 (ref 5–15)
BUN: 8 mg/dL (ref 6–20)
CALCIUM: 8.5 mg/dL — AB (ref 8.9–10.3)
CO2: 20 mmol/L — ABNORMAL LOW (ref 22–32)
CREATININE: 0.86 mg/dL (ref 0.44–1.00)
Chloride: 94 mmol/L — ABNORMAL LOW (ref 101–111)
GFR calc Af Amer: >60 mL/min (ref >60)
Glucose, Bld: 164 mg/dL — ABNORMAL HIGH (ref 65–99)
Potassium: 3.6 mmol/L (ref 3.5–5.1)
Sodium: 125 mmol/L — ABNORMAL LOW (ref 135–145)

## 2014-11-25 LAB — CBC
HCT: 30 % — ABNORMAL LOW (ref 36.0–46.0)
HEMOGLOBIN: 10.2 g/dL — AB (ref 12.0–15.0)
MCH: 27.8 pg (ref 26.0–34.0)
MCHC: 34 g/dL (ref 30.0–36.0)
MCV: 81.7 fL (ref 78.0–100.0)
Platelets: 126 10*3/uL — ABNORMAL LOW (ref 150–400)
RBC: 3.67 MIL/uL — ABNORMAL LOW (ref 3.87–5.11)
RDW: 15.4 % (ref 11.5–15.5)
WBC: 13.1 10*3/uL — AB (ref 4.0–10.5)

## 2014-11-25 LAB — SURGICAL PCR SCREEN
MRSA, PCR: NEGATIVE
Staphylococcus aureus: NEGATIVE

## 2014-11-25 LAB — HIV ANTIBODY (ROUTINE TESTING W REFLEX): HIV Screen 4th Generation wRfx: NONREACTIVE

## 2014-11-25 LAB — CLOSTRIDIUM DIFFICILE BY PCR: Toxigenic C. Difficile by PCR: POSITIVE — AB

## 2014-11-25 SURGERY — IRRIGATION AND DEBRIDEMENT HIP
Anesthesia: General | Site: Hip | Laterality: Right

## 2014-11-25 MED ORDER — OXYCODONE HCL 5 MG PO TABS: 5.0000 mg | ORAL_TABLET | Freq: Once | ORAL | Status: AC | PRN

## 2014-11-25 MED ORDER — ONDANSETRON HCL 4 MG/2ML IJ SOLN
INTRAMUSCULAR | Status: DC | PRN
  Administered 2014-11-25: 4 mg via INTRAVENOUS

## 2014-11-25 MED ORDER — SUCCINYLCHOLINE CHLORIDE 20 MG/ML IJ SOLN
INTRAMUSCULAR | Status: DC | PRN
  Administered 2014-11-25: 140 mg via INTRAVENOUS

## 2014-11-25 MED ORDER — SUCCINYLCHOLINE CHLORIDE 20 MG/ML IJ SOLN
INTRAMUSCULAR | Status: AC
  Filled 2014-11-25: qty 1

## 2014-11-25 MED ORDER — GENTAMICIN SULFATE 40 MG/ML IJ SOLN
INTRAMUSCULAR | Status: DC | PRN
  Administered 2014-11-25: 160 mg via INTRAMUSCULAR

## 2014-11-25 MED ORDER — ENOXAPARIN SODIUM 40 MG/0.4ML ~~LOC~~ SOLN
40.0000 mg | SUBCUTANEOUS | Status: DC
  Administered 2014-11-25 – 2014-11-26 (×2): 40 mg via SUBCUTANEOUS
  Filled 2014-11-25 (×2): qty 0.4

## 2014-11-25 MED ORDER — IBUPROFEN 200 MG PO TABS
800.0000 mg | ORAL_TABLET | Freq: Once | ORAL | Status: DC
Start: 2014-11-25 — End: 2014-11-26

## 2014-11-25 MED ORDER — PHENYLEPHRINE 40 MCG/ML (10ML) SYRINGE FOR IV PUSH (FOR BLOOD PRESSURE SUPPORT)
PREFILLED_SYRINGE | INTRAVENOUS | Status: AC
  Filled 2014-11-25: qty 10

## 2014-11-25 MED ORDER — VANCOMYCIN 50 MG/ML ORAL SOLUTION
125.0000 mg | Freq: Four times a day (QID) | ORAL | Status: DC
  Administered 2014-11-25 – 2014-12-04 (×30): 125 mg via ORAL
  Filled 2014-11-25 (×42): qty 2.5

## 2014-11-25 MED ORDER — ONDANSETRON HCL 4 MG/2ML IJ SOLN
4.0000 mg | Freq: Three times a day (TID) | INTRAMUSCULAR | Status: DC
  Administered 2014-11-25 – 2014-12-04 (×18): 4 mg via INTRAVENOUS
  Filled 2014-11-25 (×18): qty 2

## 2014-11-25 MED ORDER — GENTAMICIN SULFATE 40 MG/ML IJ SOLN
INTRAMUSCULAR | Status: AC
  Filled 2014-11-25: qty 2

## 2014-11-25 MED ORDER — DEXAMETHASONE SODIUM PHOSPHATE 4 MG/ML IJ SOLN
INTRAMUSCULAR | Status: AC
  Filled 2014-11-25: qty 1

## 2014-11-25 MED ORDER — LIDOCAINE HCL (CARDIAC) 20 MG/ML IV SOLN
INTRAVENOUS | Status: AC
  Filled 2014-11-25: qty 5

## 2014-11-25 MED ORDER — SODIUM CHLORIDE 0.9 % IV SOLN
INTRAVENOUS | Status: DC | PRN
  Administered 2014-11-25 (×2): via INTRAVENOUS

## 2014-11-25 MED ORDER — OXYCODONE HCL 5 MG/5ML PO SOLN: 5.0000 mg | Freq: Once | ORAL | Status: AC | PRN

## 2014-11-25 MED ORDER — CEFAZOLIN SODIUM-DEXTROSE 2-3 GM-% IV SOLR
INTRAVENOUS | Status: DC | PRN
  Administered 2014-11-25: 2 g via INTRAVENOUS

## 2014-11-25 MED ORDER — OXYCODONE HCL 5 MG/5ML PO SOLN: 5.0000 mg | Freq: Once | ORAL | Status: DC | PRN

## 2014-11-25 MED ORDER — DEXTROSE 5 % IV SOLN
10.0000 mg | INTRAVENOUS | Status: DC | PRN
  Administered 2014-11-25: 80 ug/min via INTRAVENOUS

## 2014-11-25 MED ORDER — OXYCODONE HCL 5 MG PO TABS: 5.0000 mg | ORAL_TABLET | Freq: Once | ORAL | Status: DC | PRN

## 2014-11-25 MED ORDER — VANCOMYCIN HCL IN DEXTROSE 1-5 GM/200ML-% IV SOLN
1000.0000 mg | Freq: Two times a day (BID) | INTRAVENOUS | Status: DC
  Filled 2014-11-25: qty 200

## 2014-11-25 MED ORDER — PROPOFOL 10 MG/ML IV BOLUS
INTRAVENOUS | Status: DC | PRN
  Administered 2014-11-25: 200 mg via INTRAVENOUS

## 2014-11-25 MED ORDER — PROPOFOL 10 MG/ML IV BOLUS
INTRAVENOUS | Status: AC
  Filled 2014-11-25: qty 20

## 2014-11-25 MED ORDER — HYDROMORPHONE HCL 1 MG/ML IJ SOLN: 0.2500 mg | INTRAMUSCULAR | Status: DC | PRN

## 2014-11-25 MED ORDER — MEPERIDINE HCL 25 MG/ML IJ SOLN: 6.2500 mg | INTRAMUSCULAR | Status: DC | PRN

## 2014-11-25 MED ORDER — FENTANYL CITRATE (PF) 250 MCG/5ML IJ SOLN
INTRAMUSCULAR | Status: AC
  Filled 2014-11-25: qty 5

## 2014-11-25 MED ORDER — SCOPOLAMINE 1 MG/3DAYS TD PT72
MEDICATED_PATCH | TRANSDERMAL | Status: DC | PRN
  Administered 2014-11-25: 1 via TRANSDERMAL

## 2014-11-25 MED ORDER — PHENYLEPHRINE HCL 10 MG/ML IJ SOLN
INTRAMUSCULAR | Status: DC | PRN
  Administered 2014-11-25 (×2): 80 ug via INTRAVENOUS
  Administered 2014-11-25: 100 ug via INTRAVENOUS

## 2014-11-25 MED ORDER — VANCOMYCIN HCL 500 MG IV SOLR
INTRAVENOUS | Status: AC
  Filled 2014-11-25: qty 1000

## 2014-11-25 MED ORDER — SCOPOLAMINE 1 MG/3DAYS TD PT72
MEDICATED_PATCH | TRANSDERMAL | Status: AC
Start: 2014-11-25 — End: 2014-11-25
  Filled 2014-11-25: qty 1

## 2014-11-25 MED ORDER — SODIUM CHLORIDE 0.9 % IR SOLN
Status: DC | PRN
  Administered 2014-11-25 (×3): 3000 mL

## 2014-11-25 MED ORDER — LIDOCAINE HCL (CARDIAC) 20 MG/ML IV SOLN
INTRAVENOUS | Status: DC | PRN
  Administered 2014-11-25: 60 mg via INTRAVENOUS

## 2014-11-25 MED ORDER — ONDANSETRON HCL 4 MG/2ML IJ SOLN
INTRAMUSCULAR | Status: AC
  Filled 2014-11-25: qty 2

## 2014-11-25 MED ORDER — KETOROLAC TROMETHAMINE 15 MG/ML IJ SOLN
7.5000 mg | Freq: Once | INTRAMUSCULAR | Status: AC
  Administered 2014-11-25: 7.5 mg via INTRAVENOUS
  Filled 2014-11-25: qty 1

## 2014-11-25 MED ORDER — VANCOMYCIN HCL 500 MG IV SOLR
INTRAVENOUS | Status: DC | PRN
  Administered 2014-11-25: 500 mg

## 2014-11-25 MED ORDER — LOPERAMIDE HCL 2 MG PO CAPS
2.0000 mg | ORAL_CAPSULE | ORAL | Status: DC | PRN
  Administered 2014-11-25: 2 mg via ORAL
  Filled 2014-11-25 (×2): qty 1

## 2014-11-25 MED ORDER — IBUPROFEN 200 MG PO TABS
800.0000 mg | ORAL_TABLET | Freq: Once | ORAL | Status: DC
  Filled 2014-11-25: qty 4

## 2014-11-25 MED ORDER — FENTANYL CITRATE (PF) 250 MCG/5ML IJ SOLN
INTRAMUSCULAR | Status: DC | PRN
  Administered 2014-11-25: 100 ug via INTRAVENOUS
  Administered 2014-11-25: 50 ug via INTRAVENOUS
  Administered 2014-11-25: 100 ug via INTRAVENOUS

## 2014-11-25 SURGICAL SUPPLY — 69 items
BAG DECANTER FOR FLEXI CONT (MISCELLANEOUS) IMPLANT
BNDG COHESIVE 6X5 TAN STRL LF (GAUZE/BANDAGES/DRESSINGS) ×2 IMPLANT
CONT SPEC 4OZ CLIKSEAL STRL BL (MISCELLANEOUS) ×2 IMPLANT
COVER SURGICAL LIGHT HANDLE (MISCELLANEOUS) ×5 IMPLANT
DRAPE IMP U-DRAPE 54X76 (DRAPES) ×3 IMPLANT
DRAPE INCISE IOBAN 85X60 (DRAPES) ×2 IMPLANT
DRAPE ORTHO SPLIT 77X108 STRL (DRAPES) ×6
DRAPE SURG ORHT 6 SPLT 77X108 (DRAPES) ×2 IMPLANT
DRAPE U-SHAPE 47X51 STRL (DRAPES) ×3 IMPLANT
DRSG ADAPTIC 3X8 NADH LF (GAUZE/BANDAGES/DRESSINGS) ×3 IMPLANT
DRSG MEPILEX BORDER 4X8 (GAUZE/BANDAGES/DRESSINGS) ×4 IMPLANT
DRSG PAD ABDOMINAL 8X10 ST (GAUZE/BANDAGES/DRESSINGS) ×3 IMPLANT
DURAPREP 26ML APPLICATOR (WOUND CARE) ×3 IMPLANT
ELECT BLADE 4.0 EZ CLEAN MEGAD (MISCELLANEOUS) ×6
ELECT CAUTERY BLADE 6.4 (BLADE) IMPLANT
ELECT REM PT RETURN 9FT ADLT (ELECTROSURGICAL)
ELECTRODE BLDE 4.0 EZ CLN MEGD (MISCELLANEOUS) IMPLANT
ELECTRODE REM PT RTRN 9FT ADLT (ELECTROSURGICAL) IMPLANT
FACESHIELD STD STERILE (MASK) ×8 IMPLANT
GAUZE SPONGE 4X4 12PLY STRL (GAUZE/BANDAGES/DRESSINGS) ×3 IMPLANT
GAUZE XEROFORM 1X8 LF (GAUZE/BANDAGES/DRESSINGS) ×2 IMPLANT
GLOVE BIO SURGEON STRL SZ8 (GLOVE) ×3 IMPLANT
GLOVE BIOGEL PI IND STRL 7.5 (GLOVE) IMPLANT
GLOVE BIOGEL PI IND STRL 8 (GLOVE) ×1 IMPLANT
GLOVE BIOGEL PI INDICATOR 7.5 (GLOVE) ×8
GLOVE BIOGEL PI INDICATOR 8 (GLOVE) ×8
GLOVE BIOGEL PI ORTHO PRO SZ7 (GLOVE) ×8
GLOVE ORTHO TXT STRL SZ7.5 (GLOVE) ×11 IMPLANT
GLOVE PI ORTHO PRO STRL SZ7 (GLOVE) IMPLANT
GLOVE SURG ORTHO 8.0 STRL STRW (GLOVE) ×8 IMPLANT
GLOVE SURG SS PI 7.0 STRL IVOR (GLOVE) ×8 IMPLANT
GOWN STRL REUS W/ TWL LRG LVL3 (GOWN DISPOSABLE) ×1 IMPLANT
GOWN STRL REUS W/ TWL XL LVL3 (GOWN DISPOSABLE) ×4 IMPLANT
GOWN STRL REUS W/TWL LRG LVL3 (GOWN DISPOSABLE) ×3
GOWN STRL REUS W/TWL XL LVL3 (GOWN DISPOSABLE) ×30
HANDPIECE INTERPULSE COAX TIP (DISPOSABLE) ×3
KIT BASIN OR (CUSTOM PROCEDURE TRAY) ×3 IMPLANT
KIT ROOM TURNOVER OR (KITS) ×3 IMPLANT
KIT STIMULAN RAPID CURE  10CC (Orthopedic Implant) ×2 IMPLANT
KIT STIMULAN RAPID CURE 10CC (Orthopedic Implant) IMPLANT
MANIFOLD NEPTUNE II (INSTRUMENTS) ×3 IMPLANT
NDL SPNL 18GX3.5 QUINCKE PK (NEEDLE) IMPLANT
NEEDLE SPNL 18GX3.5 QUINCKE PK (NEEDLE) ×3 IMPLANT
NS IRRIG 1000ML POUR BTL (IV SOLUTION) ×3 IMPLANT
PACK TOTAL JOINT (CUSTOM PROCEDURE TRAY) ×3 IMPLANT
PACK UNIVERSAL I (CUSTOM PROCEDURE TRAY) ×5 IMPLANT
PAD ARMBOARD 7.5X6 YLW CONV (MISCELLANEOUS) ×6 IMPLANT
SET HNDPC FAN SPRY TIP SCT (DISPOSABLE) IMPLANT
SPONGE GAUZE 4X4 12PLY STER LF (GAUZE/BANDAGES/DRESSINGS) ×2 IMPLANT
SPONGE LAP 18X18 X RAY DECT (DISPOSABLE) ×3 IMPLANT
STAPLER VISISTAT 35W (STAPLE) ×3 IMPLANT
SUT ETHILON 2 0 FS 18 (SUTURE) ×5 IMPLANT
SUT PDS AB 0 CT 36 (SUTURE) ×4 IMPLANT
SUT PDS AB 1 CT  36 (SUTURE) ×4
SUT PDS AB 1 CT 36 (SUTURE) IMPLANT
SUT PDS AB 2-0 CT1 27 (SUTURE) ×4 IMPLANT
SUT VIC AB 1 CTB1 27 (SUTURE) ×6 IMPLANT
SUT VIC AB 2-0 CT1 27 (SUTURE) ×6
SUT VIC AB 2-0 CT1 TAPERPNT 27 (SUTURE) ×2 IMPLANT
SUT VICRYL 0 CT 1 36IN (SUTURE) ×3 IMPLANT
SWAB COLLECTION DEVICE MRSA (MISCELLANEOUS) ×2 IMPLANT
SYRINGE 60CC LL (MISCELLANEOUS) ×2 IMPLANT
TOWEL OR 17X24 6PK STRL BLUE (TOWEL DISPOSABLE) ×3 IMPLANT
TOWEL OR 17X26 10 PK STRL BLUE (TOWEL DISPOSABLE) ×5 IMPLANT
TUBE ANAEROBIC SPECIMEN COL (MISCELLANEOUS) ×2 IMPLANT
TUBING SUCTION BULK 100 FT (MISCELLANEOUS) ×2 IMPLANT
UNDERPAD 30X30 INCONTINENT (UNDERPADS AND DIAPERS) ×3 IMPLANT
WATER STERILE IRR 1000ML POUR (IV SOLUTION) ×3 IMPLANT
YANKAUER SUCT BULB TIP NO VENT (SUCTIONS) ×2 IMPLANT

## 2014-11-25 NOTE — Op Note (Signed)
NAMESHERLONDA, FLATER NO.:  [id number]  MEDICAL RECORD NO.:  [id number]  LOCATION:  5N25C                        FACILITY:  [place]  PHYSICIAN:  Lind Guest. 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  Cell count, cultures, inflammatory markers pending.  PROCEDURE:  Irrigation and debridement of right hip superficial and deep tissue including sharp excision and debridement of pseudotumor type of tissue surrounding the hip socket and femoral component.  SURGEON:  Lind Guest. Ninfa Linden, M.D.  ASSISTING:  Erskine Emery, PA-C.  ANESTHESIA:  General.  ESTIMATED BLOOD LOSS:  Less than 300 mL.  COMPLICATIONS:  None.  INDICATIONS FOR PROCEDURE:  Ms. Agudelo is a 59 year old female, who presented to an outside emergency room this past Friday with severe right hip pain and a temperature of 103.  The evening before, she had been sitting [place] and getting up from [place] around with her vacuum cleaner, she felt a pop in her hip, and then, she developed pain. She denies any hip pain prior to that; however, she had been feeling ill for about a week.  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 Apparently, the patient then developed  a significant infection and was on antibiotics for some period of time.  Then, she was transferred to our hospital as it was felt this could be a prosthetic joint infection. I had her admitted, and she had already been on antibiotics per the outside hospital sepsis protocol, and this was within the [facility] system and she is to continue on IV antibiotics.  Her peripheral white blood cell count was 22,000.  The next day, we attempted to have her hip aspirated under direct fluoroscopy but were unsuccessful; and after some anti-inflammatory, she said her hip pain essentially had gone.  However then, she developed loose watery stools, and this was identified as positive for C. diff colitis.  The General Medicine Service is being consulted, and she is on appropriate antibiotics for that.  I then obtained an MRI scan today, and this was [facility] MRI, and we did see a large fluid collection that may be consistent with an abscess versus pseudotumor with some enhancing aspects of inflammatory tissue. Apparently, they compared this to a CT scan from 2012 that did show a fluid collection then, but it is much larger now.  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  At this point, given her continued hip pain that has developed again, although her white blood cell count has come down  to 13,000, I do feel that she needs at least an irrigation and debridement of the collection around her right hip, so we can assess the prosthesis and to determine whether or not this is an infection or not.  She understands this is not a setting for any type of revision arthroplasty but that at least getting a better identification where the tissues are important in light of her infection and C. diff as well.  PROCEDURE DESCRIPTION:  After informed consent was obtained, appropriate right hip was marked.  She was brought to the operating room, placed supine on the operating table.   General anesthesia was then obtained. She was then turned to lateral decubitus position with the right operative hip up.  Appropriate positions were placed in the front and the back, and her right hip was prepped and draped with DuraPrep and sterile drapes including sterile stockinette.  A time-out was called in order to identify the correct patient, correct right hip.  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  We did send it to lab for identifying the cell count and differential after this.  We then made our incision through her previous incision and dissected down to the posterior hip joint and did not find any infection going down to the IT band area, and then, I opened up the IT band, and I did find a walled off area that when I opened up and found significant milky fluid and a capsule like tissue suggesting a significant pseudotumor.  Using a rongeur, a #15 blade, and a scalpel, we excised sharply necrotic soft tissue.  We did not need to remove any bone.  I then irrigated the tissue deeply with 3 L normal saline solution.  We then changed gowns, gloves and all instrumentation.  I was able to dissect down to the hip joint itself, and it was difficult to tell whether not the actual hip socket was loose.  I could tell that the stem was well seated as far as the femoral component goes.  The hip socket itself I do believe there is potential for this being loose; but regardless with a large pseudotumor such as this a revision is probably warranted at some point.  I then irrigated an additional 6 L of normal saline solution through the hip for a total of 9 L of fluid.  We then mixed some stimulant antibiotics dissolvable beads with vancomycin and gentamicin and placed these deep  around the prosthesis in the hip joint.  We then were able to close the iliotibial band with interrupted #1 PDS suture, followed by 0 PDS in deep tissue, 2- 0 PDS in the subcutaneous tissue, and staples on the skin.  A Well- padded sterile dressing was applied.  She was turned back into a supine position, awakened, extubated, and taken to recovery room in stable condition.  All final counts were correct.  There were no complications noted.  Postoperatively, we will slowly increase her activities and await our laboratory analysis results to determine what our next steps of treatment will be.     Lind Guest. Ninfa Linden, M.D.     CYB/MEDQ  D:  11/25/2014  T:  11/25/2014  Job:  [id number]

## 2014-11-25 NOTE — Transfer of Care (Signed)
Immediate Anesthesia Transfer of Care Note  Patient: Christina French  Procedure(s) Performed: Procedure(s): IRRIGATION  AND DRAINAGEOF RIGHT HIP WITH PLACEMENT OF ANTIBIOUTIC BEADS (Right)  Patient Location: PACU  Anesthesia Type:General  Level of Consciousness: awake  Airway & Oxygen Therapy: Patient Spontanous Breathing and Patient connected to nasal cannula oxygen  Post-op Assessment: Report given to RN and Post -op Vital signs reviewed and stable  Post vital signs: Reviewed and stable  Last Vitals:  Filed Vitals:   11/25/14 1349  BP: 148/82  Pulse: 92  Temp: 36.9 C  Resp: 16    Complications: No apparent anesthesia complications

## 2014-11-25 NOTE — Brief Op Note (Signed)
11/23/2014 - 11/25/2014  6:16 PM  PATIENT:  Christina French  59 y.o. 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  PROCEDURE:  Procedure(s): IRRIGATION  AND DRAINAGEOF RIGHT HIP WITH PLACEMENT OF ANTIBIOUTIC BEADS (Right)  SURGEON:  Surgeon(s) and Role:    * Mcarthur Rossetti, MD - Primary  PHYSICIAN ASSISTANT: Benita Stabile, PA-C  ANESTHESIA:   general  EBL:  Total I/O In: 1030 [P.O.:480; I.V.:550] Out: 75 [Blood:75]  BLOOD ADMINISTERED:none  DRAINS: none   LOCAL MEDICATIONS USED:  NONE  SPECIMEN:  Excision  DISPOSITION OF SPECIMEN:  PATHOLOGY  COUNTS:  YES  TOURNIQUET:  * No tourniquets in log *  DICTATION: .Other Dictation: Dictation Number [id number]  PLAN OF CARE: Admit to inpatient   PATIENT DISPOSITION:  PACU - hemodynamically stable.   Delay start of Pharmacological VTE agent (>24hrs) due to surgical blood loss or risk of bleeding: no

## 2014-11-25 NOTE — Progress Notes (Signed)
PATIENT DETAILS Name: Christina French Age: 59 y.o. Sex: female Date of Birth: February 27, 1956 Admit Date: 11/23/2014 Admitting Physician Mcarthur Rossetti, MD LKT:GYBW, Timoteo Gaul, MD  Subjective: Nauseous this morning. Had significant diarrhea yesterday. Right hip pain somewhat better.  Assessment/Plan: Principal Problem: Sepsis: Foci uncertain. Unfortunately already started on broad spectrum empiric antibiotics. Workup so far involves dry trap on arthrocentesis 2, negative blood cultures, negative CT chest. No other foci evident on exam or by history-and did have antibiotics approximately 1 month back for right calf infection. Did develop diarrhea yesterday-C. difficile PCR pending. Since no foci of infection evident-and all cultures negative-and she remains hemodynamically stable-stop all antibiotics and follow clinical course. May need to repeat cultures 48 hours after stopping antibiotics. Agree with getting a MRI of right hip-if negative-Will check CT abdomen. if workup continues to be negative, will ask infectious disease for assistance   Active Problems: Hyponatremia: Euvolemic on exam-although has diarrhea. Stop IV fluids and fluid restrict. Follow.   Hypertension: Moderate controlled-continue atenolol. Stop IV fluids. Follow and titrate accordingly  Seizure disorder: Continue with carbamazepine-watch sodium.   History of depression with anxiety: Continue with Xanax, Lexapro, Remeron and trazodone-watch sodium.  Hx of Malignant Melanoma in Situ-right leg-followed at [facility]  Disposition: Remain inpatient  Antimicrobial agents  See below  Anti-infectives    Start     Dose/Rate Route Frequency Ordered Stop   11/25/14 1700  vancomycin (VANCOCIN) IVPB 1000 mg/200 mL premix     1,000 mg 200 mL/hr over 60 Minutes Intravenous Every 12 hours 11/25/14 0916     11/24/14 1200  piperacillin-tazobactam (ZOSYN) IVPB 3.375 g     3.375 g 12.5 mL/hr over 240 Minutes  Intravenous 3 times per day 11/24/14 0210     11/24/14 0700  vancomycin (VANCOCIN) IVPB 750 mg/150 ml premix  Status:  Discontinued     750 mg 150 mL/hr over 60 Minutes Intravenous Every 12 hours 11/24/14 0210 11/25/14 0916   11/24/14 0300  piperacillin-tazobactam (ZOSYN) IVPB 3.375 g     3.375 g 100 mL/hr over 30 Minutes Intravenous  Once 11/24/14 0210 11/24/14 0858     DVT Prophylaxis: Prophylactic Lovenox   Code Status: Full code   Family Communication None at bedside  Procedures: None  CONSULTS:  None  Time spent 40 minutes-Greater than 50% of this time was spent in counseling, explanation of diagnosis, planning of further management, and coordination of care.  MEDICATIONS: Scheduled Meds: . acidophilus  1 capsule Oral Daily  . atenolol  50 mg Oral BID  . carbamazepine  300 mg Oral BID  . escitalopram  20 mg Oral Daily  . ibuprofen  800 mg Oral Once  . ketorolac  7.5 mg Intravenous Once  . mirtazapine  22.5 mg Oral QHS  . pantoprazole  40 mg Oral Daily  . piperacillin-tazobactam (ZOSYN)  IV  3.375 g Intravenous 3 times per day  . traZODone  50 mg Oral QHS  . vancomycin  1,000 mg Intravenous Q12H   Continuous Infusions:  PRN Meds:.ALPRAZolam, diphenhydrAMINE, HYDROmorphone (DILAUDID) injection, loperamide, methocarbamol, ondansetron (ZOFRAN) IV, oxyCODONE, promethazine    PHYSICAL EXAM: Vital signs in last 24 hours: Filed Vitals:   11/24/14 0153 11/24/14 0640 11/24/14 2002 11/25/14 0610  BP: 109/66 147/74 116/61 169/85  Pulse: 78 106 88 106  Temp: 98.4 F (36.9 C) 99.4 F (37.4 C) 98.6 F (37 C) 98.4 F (36.9 C)  TempSrc: Oral Oral Oral Oral  Resp: 17 17 16 16   Weight:      SpO2: 98% 98% 100% 100%    Weight change:  Filed Weights   11/24/14 0018  Weight: 90.719 kg (200 lb)   Body mass index is 37.81 kg/(m^2).   Gen Exam: Awake and alert with clear speech.   Neck: Supple, No JVD.   Chest: B/L Clear.   CVS: S1 S2 Regular, no murmurs.    Abdomen: soft, BS +, non tender, non distended.  Extremities: no edema, lower extremities warm to touch. Neurologic: Non Focal.   Skin: No Rash.   Wounds: N/A.   Intake/Output from previous day:  Intake/Output Summary (Last 24 hours) at 11/25/14 1103 Last data filed at 11/25/14 0900  Gross per 24 hour  Intake    240 ml  Output    100 ml  Net    140 ml     LAB RESULTS: CBC  Recent Labs Lab 11/23/14 1245 11/24/14 0405 11/24/14 1455 11/25/14 0436  WBC 22.0* 15.8* 21.1* 13.1*  HGB 11.6* 10.3* 11.5* 10.2*  HCT 34.2* 30.3* 34.0* 30.0*  PLT 160 123* 150 126*  MCV 81.5 81.5 83.1 81.7  MCH 27.7 27.7 28.1 27.8  MCHC 34.0 34.0 33.8 34.0  RDW 17.8* 16.0* 15.7* 15.4  LYMPHSABS 0.8*  --  0.7  --   MONOABS 1.9*  --  1.8*  --   EOSABS 0.0  --  0.0  --   BASOSABS 0.1  --  0.0  --     Chemistries   Recent Labs Lab 11/23/14 1245 11/24/14 0405 11/24/14 1455 11/25/14 0436  NA 127* 124* 123* 125*  K 4.1 3.4* 3.5 3.6  CL 90* 89* 87* 94*  CO2 25 25 24  20*  GLUCOSE 167* 143* 167* 164*  BUN 16 12 10 8   CREATININE 1.22* 1.14* 1.08* 0.86  CALCIUM 8.7* 8.0* 8.4* 8.5*    CBG: No results for input(s): GLUCAP in the last 168 hours.  GFR Estimated Creatinine Clearance: 74.1 mL/min (by C-G formula based on Cr of 0.86).  Coagulation profile  Recent Labs Lab 11/24/14 1455  INR 1.40    Cardiac Enzymes No results for input(s): CKMB, TROPONINI, MYOGLOBIN in the last 168 hours.  Invalid input(s): CK  Invalid input(s): POCBNP No results for input(s): DDIMER in the last 72 hours. No results for input(s): HGBA1C in the last 72 hours. No results for input(s): CHOL, HDL, LDLCALC, TRIG, CHOLHDL, LDLDIRECT in the last 72 hours. No results for input(s): TSH, T4TOTAL, T3FREE, THYROIDAB in the last 72 hours.  Invalid input(s): FREET3 No results for input(s): VITAMINB12, FOLATE, FERRITIN, TIBC, IRON, RETICCTPCT in the last 72 hours. No results for input(s): LIPASE, AMYLASE in the  last 72 hours.  Urine Studies No results for input(s): UHGB, CRYS in the last 72 hours.  Invalid input(s): UACOL, UAPR, USPG, UPH, UTP, UGL, UKET, UBIL, UNIT, UROB, ULEU, UEPI, UWBC, URBC, UBAC, CAST, UCOM, BILUA  MICROBIOLOGY: Recent Results (from the past 240 hour(s))  Blood culture (routine x 2)     Status: None (Preliminary result)   Collection Time: 11/23/14  1:32 PM  Result Value Ref Range Status   Specimen Description BLOOD RIGHT HAND  Final   Special Requests BOTTLES DRAWN AEROBIC AND ANAEROBIC  1CC  Final   Culture NO GROWTH 2 DAYS  Final   Report Status PENDING  Incomplete  Blood culture (routine x 2)     Status: None (Preliminary result)  Collection Time: 11/23/14  2:14 PM  Result Value Ref Range Status   Specimen Description BLOOD LEFT ASSIST CONTROL  Final   Special Requests   Final    BOTTLES DRAWN AEROBIC AND ANAEROBIC  AER [place] ANA [place]   Culture NO GROWTH 2 DAYS  Final   Report Status PENDING  Incomplete  Culture, blood (x 2)     Status: None (Preliminary result)   Collection Time: 11/24/14  3:13 PM  Result Value Ref Range Status   Specimen Description BLOOD RIGHT ANTECUBITAL  Final   Special Requests BOTTLES DRAWN AEROBIC ONLY 5CC  Final   Culture PENDING  Incomplete   Report Status PENDING  Incomplete    RADIOLOGY STUDIES/RESULTS: Dg Chest 2 View  11/23/2014   CLINICAL DATA:  Fever  EXAM: CHEST  2 VIEW  COMPARISON:  05/29/2005  FINDINGS: Cardiac enlargement without heart failure. Vascularity is normal. Lungs are clear without infiltrate effusion or mass. No change from the prior study.  IMPRESSION: No active cardiopulmonary disease.   Electronically Signed   By: Franchot Gallo M.D.   On: 11/23/2014 13:11   Ct Chest W Contrast  11/24/2014   CLINICAL DATA:  Nonproductive cough for 2 months. Decreased appetite. Sepsis.  EXAM: CT CHEST WITH CONTRAST  TECHNIQUE: Multidetector CT imaging of the chest was performed during intravenous contrast administration.   CONTRAST:  66mL OMNIPAQUE IOHEXOL 300 MG/ML  SOLN  COMPARISON:  Chest x-ray 11/23/2014  FINDINGS: Linear scarring in the lung bases. Lungs are otherwise clear. No pleural effusions.  Heart is normal size. Aorta is normal caliber. No mediastinal, hilar, or axillary adenopathy. Chest wall soft tissues are unremarkable.  Imaging into the upper abdomen shows no acute findings. No acute bony abnormality or focal bone lesion.  IMPRESSION: No acute cardiopulmonary disease.   Electronically Signed   By: Rolm Baptise M.D.   On: 11/24/2014 17:01   Dg Fluoro Guide Ndl Plcd/bx/inj/loc  11/24/2014   CLINICAL DATA:  Right hip pain, status post right hip arthroplasty, hip aspiration of possible effusion  EXAM: RIGHT HIP ASPIRATION UNDER FLUOROSCOPY  FLUOROSCOPY TIME:  Fluoroscopy Time (in minutes and seconds): 1 minute 12 seconds  Number of Acquired Images:  2  PROCEDURE: Overlying skin prepped with Betadine, draped in the usual sterile fashion, and infiltrated locally with buffered Lidocaine.  20 gauge spinal needle advanced to the superolateral margin of the right femoral head prosthesis. 1 ml of Lidocaine injected easily. No fluid could be aspirated from the joint.  Repeat attempted at the level of the femoral neck prosthesis. No fluid could be aspirated from the joint.  Study was terminated after consultation with the referring physician.  No immediate complication.  IMPRESSION: Attempted right hip aspiration under fluoroscopy.  No fluid could be aspirated from the joint at the level of the femoral head or neck.   Electronically Signed   By: Julian Hy M.D.   On: 11/24/2014 13:48   Dg Hip Unilat With Pelvis 1v Right  11/23/2014   CLINICAL DATA:  Right hip pain with vomiting and fever.  EXAM: DG HIP (WITH OR WITHOUT PELVIS) 1V RIGHT  COMPARISON:  Earlier today as well as 03/27/2010.  FINDINGS: There are mild degenerative changes of the left hip. Right hip arthroplasty unchanged. Mild degenerative changes of the  spine. Surgical sutures over the pelvis just right of midline.  IMPRESSION: Right hip arthroplasty unchanged. No acute findings. Degenerative change left hip.   Electronically Signed   By: Marin Olp  M.D.   On: 11/23/2014 21:43   Dg Hip Unilat With Pelvis 2-3 Views Right  11/23/2014   CLINICAL DATA:  Acute right hip pain.  EXAM: DG HIP (WITH OR WITHOUT PELVIS) 2-3V RIGHT  COMPARISON:  March 27, 2010.  FINDINGS: Status post right hip arthroplasty. No acute fracture or dislocation is noted. Left hip appears normal. Sacroiliac joints appear normal.  IMPRESSION: Status post right hip arthroplasty.  No acute abnormality seen.   Electronically Signed   By: Marijo Conception, M.D.   On: 11/23/2014 12:17   Ct Maxillofacial Wo Cm  11/23/2014   CLINICAL DATA:  Frontal headache for several days.  Febrile.  EXAM: CT MAXILLOFACIAL WITHOUT CONTRAST  TECHNIQUE: Multidetector CT imaging of the maxillofacial structures was performed. Multiplanar CT image reconstructions were also generated. A small metallic BB was placed on the right temple in order to reliably differentiate right from left.  COMPARISON:  None.  FINDINGS: The bones are intact. There is no fracture, bone lesion or bony destruction. There is an air-fluid level in the left sphenoid sinus with no membrane thickening. This was also visible on the MRI of 08/24/2012, and is nonspecific. There is no significant membrane thickening in any of the paranasal sinuses. There is no retention cyst or polyp.  IMPRESSION: Minimal changes with a left sphenoid air-fluid level, but no inflammatory soft tissue thickening. Remainder of the paranasal sinuses are clear.   Electronically Signed   By: Andreas Newport M.D.   On: 11/23/2014 17:47    Oren Binet, MD  [facility] Pager:[phone]  If 7PM-7AM, please contact night-coverage [email or web address] Password [email or web address] 11/25/2014, 11:03 AM   LOS: 1 day

## 2014-11-25 NOTE — Anesthesia Procedure Notes (Signed)
Procedure Name: Intubation Date/Time: 11/25/2014 4:33 PM Performed by: Marinda Elk A Pre-anesthesia Checklist: Patient identified, Timeout performed, Emergency Drugs available, Suction available and Patient being monitored Patient Re-evaluated:Patient Re-evaluated prior to inductionOxygen Delivery Method: Circle system utilized Preoxygenation: Pre-oxygenation with 100% oxygen Intubation Type: IV induction, Rapid sequence and Cricoid Pressure applied Laryngoscope Size: Mac and 3 Grade View: Grade II Tube type: Oral Tube size: 7.5 mm Number of attempts: 1 Airway Equipment and Method: Stylet Placement Confirmation: ETT inserted through vocal cords under direct vision,  breath sounds checked- equal and bilateral and positive ETCO2 Secured at: 22 cm Tube secured with: Tape Dental Injury: Teeth and Oropharynx as per pre-operative assessment

## 2014-11-25 NOTE — Anesthesia Postprocedure Evaluation (Signed)
  Anesthesia Post-op Note  Patient: Christina French  Procedure(s) Performed: Procedure(s): IRRIGATION  AND DRAINAGEOF RIGHT HIP WITH PLACEMENT OF ANTIBIOUTIC BEADS (Right)  Patient Location: PACU  Anesthesia Type: General   Level of Consciousness: awake, alert  and oriented  Airway and Oxygen Therapy: Patient Spontanous Breathing  Post-op Pain: none  Post-op Assessment: Post-op Vital signs reviewed  Post-op Vital Signs: Reviewed  Last Vitals:  Filed Vitals:   11/25/14 1900  BP:   Pulse:   Temp: 37.1 C  Resp:     Complications: No apparent anesthesia complications

## 2014-11-25 NOTE — Anesthesia Preprocedure Evaluation (Addendum)
Anesthesia Evaluation  Patient identified by MRN, date of birth, ID band Patient awake    Reviewed: Allergy & Precautions, NPO status , Patient's Chart, lab work & pertinent test results, reviewed documented beta blocker date and time   Airway Mallampati: II  TM Distance: >3 FB Neck ROM: Full    Dental  (+) Teeth Intact, Dental Advisory Given   Pulmonary  breath sounds clear to auscultation        Cardiovascular hypertension, Pt. on home beta blockers and Pt. on medications Rhythm:Regular Rate:Normal     Neuro/Psych Seizures -, Well Controlled,  Anxiety Depression    GI/Hepatic   Endo/Other    Renal/GU      Musculoskeletal   Abdominal   Peds  Hematology   Anesthesia Other Findings   Reproductive/Obstetrics                           Anesthesia Physical Anesthesia Plan  ASA: II and emergent  Anesthesia Plan: General   Post-op Pain Management:    Induction: Intravenous  Airway Management Planned: Oral ETT  Additional Equipment:   Intra-op Plan:   Post-operative Plan: Extubation in OR  Informed Consent: I have reviewed the patients History and Physical, chart, labs and discussed the procedure including the risks, benefits and alternatives for the proposed anesthesia with the patient or authorized representative who has indicated his/her understanding and acceptance.   Dental advisory given  Plan Discussed with: CRNA, Anesthesiologist and Surgeon  Anesthesia Plan Comments:        Anesthesia Quick Evaluation

## 2014-11-25 NOTE — Progress Notes (Signed)
ANTIBIOTIC CONSULT NOTE - FOLLOW UP  Pharmacy Consult:  Vancomycin / Zosyn Indication: sepsis  No Known Allergies  Patient Measurements: Weight: 200 lb (90.719 kg)  Ht: 61 inches  Vital Signs: Temp: 98.4 F (36.9 C) (07/31 0610) Temp Source: Oral (07/31 0610) BP: 169/85 mmHg (07/31 0610) Pulse Rate: 106 (07/31 0610) Intake/Output from previous day: 07/30 0701 - 07/31 0700 In: 120 [P.O.:120] Out: 100 [Urine:100]  Labs:  Recent Labs  11/24/14 0405 11/24/14 1455 11/25/14 0436  WBC 15.8* 21.1* 13.1*  HGB 10.3* 11.5* 10.2*  PLT 123* 150 126*  CREATININE 1.14* 1.08* 0.86   Estimated Creatinine Clearance: 74.1 mL/min (by C-G formula based on Cr of 0.86). No results for input(s): VANCOTROUGH, VANCOPEAK, VANCORANDOM, GENTTROUGH, GENTPEAK, GENTRANDOM, TOBRATROUGH, TOBRAPEAK, TOBRARND, AMIKACINPEAK, AMIKACINTROU, AMIKACIN in the last 72 hours.   Microbiology: Recent Results (from the past 720 hour(s))  Blood culture (routine x 2)     Status: None (Preliminary result)   Collection Time: 11/23/14  1:32 PM  Result Value Ref Range Status   Specimen Description BLOOD RIGHT HAND  Final   Special Requests BOTTLES DRAWN AEROBIC AND ANAEROBIC  1CC  Final   Culture NO GROWTH 2 DAYS  Final   Report Status PENDING  Incomplete  Blood culture (routine x 2)     Status: None (Preliminary result)   Collection Time: 11/23/14  2:14 PM  Result Value Ref Range Status   Specimen Description BLOOD LEFT ASSIST CONTROL  Final   Special Requests   Final    BOTTLES DRAWN AEROBIC AND ANAEROBIC  AER [place] ANA [place]   Culture NO GROWTH 2 DAYS  Final   Report Status PENDING  Incomplete  Culture, blood (x 2)     Status: None (Preliminary result)   Collection Time: 11/24/14  3:13 PM  Result Value Ref Range Status   Specimen Description BLOOD RIGHT ANTECUBITAL  Final   Special Requests BOTTLES DRAWN AEROBIC ONLY 5CC  Final   Culture PENDING  Incomplete   Report Status PENDING  Incomplete       Assessment: Christina French started on vancomycin and Zosyn for sepsis, possibly from hip infection.  Ortho now observing to see whether patient is infected to decide on surgery.  Patient's renal function is improving.  Vanc 7/30 >> Zosyn 7/30 >>  7/30 BCx x2 - 7/30 UCx -  7/31 C.diff -   Goal of Therapy:  Vancomycin trough level 15-20 mcg/ml   Plan:  - Change vanc to 1gm IV Q12H - Continue Zosyn 3.375gm IV Q8H, 4 hr infusion - Monitor renal fxn, clinical progress, vanc trough as indicated - F/U C.diff PCR, surgery plans    Haliey Romberg D. Mina Marble, PharmD, BCPS Pager:  [phone] 11/25/2014, 9:15 AM

## 2014-11-25 NOTE — Progress Notes (Signed)
Patient ID: Christina French, female   DOB: March 18, 1956, 59 y.o.   MRN: [id number] I'm still uncertain if we are dealing with an infected hip at all.  I came by her bedside at 7pm last evening and she was able to ambulate on her right hip/leg and denied any pain at all.  However, she did develop significant loose stools.  C. Diff is pending.  She still has significant diarrhea this am and says her hip hurts again, but is less pain than before and is just "sore".  Her WBC is trending down today.  I am still quite uncertain whether or not to proceed to surgery given the inconsistency in her exam and what she is reporting.  I will give her a little more time to see if any improvement at all.

## 2014-11-25 NOTE — Progress Notes (Signed)
Patient ID: Christina French, female   DOB: 04-04-1956, 59 y.o.   MRN: [id number] Given her continued pain and the MRI findings showing a fluid collection around here right hip, I feel obligated to proceed to surgery today to wash out her right hip.  I spoke with her and her daughter in great detail about this and they understand fully the need to proceed to surgery today.

## 2014-11-25 NOTE — Progress Notes (Signed)
Lab notified this RN, stool + C Diff. Informed Dr. Ninfa Linden - was outside patient's room, he updated patient and her daughter regarding results. Informed Dr. Sloan Leiter, he stated would enter/ update orders regarding d/c Imodium, start Vancomycin PO.

## 2014-11-25 NOTE — Progress Notes (Signed)
Patient ID: Christina French, female   DOB: 02-23-1956, 59 y.o.   MRN: [id number] Will order MRI today of right hip to see if this may assist with diagnosis.

## 2014-11-26 ENCOUNTER — Encounter (HOSPITAL_COMMUNITY): Payer: Self-pay | Admitting: Orthopaedic Surgery

## 2014-11-26 ENCOUNTER — Other Ambulatory Visit (HOSPITAL_COMMUNITY): Payer: Self-pay

## 2014-11-26 DIAGNOSIS — A4901 Methicillin susceptible Staphylococcus aureus infection, unspecified site: Secondary | ICD-10-CM | POA: Insufficient documentation

## 2014-11-26 DIAGNOSIS — T8451XA Infection and inflammatory reaction due to internal right hip prosthesis, initial encounter: Secondary | ICD-10-CM

## 2014-11-26 DIAGNOSIS — Y838 Other surgical procedures as the cause of abnormal reaction of the patient, or of later complication, without mention of misadventure at the time of the procedure: Secondary | ICD-10-CM

## 2014-11-26 DIAGNOSIS — Z96649 Presence of unspecified artificial hip joint: Secondary | ICD-10-CM

## 2014-11-26 DIAGNOSIS — A0472 Enterocolitis due to Clostridium difficile, not specified as recurrent: Secondary | ICD-10-CM | POA: Insufficient documentation

## 2014-11-26 DIAGNOSIS — R197 Diarrhea, unspecified: Secondary | ICD-10-CM

## 2014-11-26 DIAGNOSIS — A047 Enterocolitis due to Clostridium difficile: Secondary | ICD-10-CM

## 2014-11-26 DIAGNOSIS — B9561 Methicillin susceptible Staphylococcus aureus infection as the cause of diseases classified elsewhere: Secondary | ICD-10-CM

## 2014-11-26 DIAGNOSIS — Z9889 Other specified postprocedural states: Secondary | ICD-10-CM

## 2014-11-26 DIAGNOSIS — Z1239 Encounter for other screening for malignant neoplasm of breast: Secondary | ICD-10-CM | POA: Insufficient documentation

## 2014-11-26 DIAGNOSIS — T8459XA Infection and inflammatory reaction due to other internal joint prosthesis, initial encounter: Secondary | ICD-10-CM | POA: Insufficient documentation

## 2014-11-26 LAB — URINE CULTURE: Culture: NO GROWTH

## 2014-11-26 LAB — BASIC METABOLIC PANEL
Anion gap: 6 (ref 5–15)
BUN: 8 mg/dL (ref 6–20)
CALCIUM: 8.2 mg/dL — AB (ref 8.9–10.3)
CO2: 24 mmol/L (ref 22–32)
Chloride: 95 mmol/L — ABNORMAL LOW (ref 101–111)
Creatinine, Ser: 0.86 mg/dL (ref 0.44–1.00)
GFR calc Af Amer: >60 mL/min (ref >60)
Glucose, Bld: 148 mg/dL — ABNORMAL HIGH (ref 65–99)
POTASSIUM: 3.3 mmol/L — AB (ref 3.5–5.1)
Sodium: 125 mmol/L — ABNORMAL LOW (ref 135–145)

## 2014-11-26 LAB — CBC
HCT: 23.5 % — ABNORMAL LOW (ref 36.0–46.0)
Hemoglobin: 8.2 g/dL — ABNORMAL LOW (ref 12.0–15.0)
MCH: 28.4 pg (ref 26.0–34.0)
MCHC: 34.9 g/dL (ref 30.0–36.0)
MCV: 81.3 fL (ref 78.0–100.0)
PLATELETS: 120 10*3/uL — AB (ref 150–400)
RBC: 2.89 MIL/uL — ABNORMAL LOW (ref 3.87–5.11)
RDW: 15.1 % (ref 11.5–15.5)
WBC: 7.4 10*3/uL (ref 4.0–10.5)

## 2014-11-26 MED ORDER — VANCOMYCIN HCL 10 G IV SOLR
1250.0000 mg | INTRAVENOUS | Status: AC
  Administered 2014-11-26: 1250 mg via INTRAVENOUS
  Filled 2014-11-26: qty 1250

## 2014-11-26 MED ORDER — FUROSEMIDE 20 MG PO TABS
20.0000 mg | ORAL_TABLET | Freq: Every day | ORAL | Status: DC
  Administered 2014-11-26 – 2014-12-04 (×6): 20 mg via ORAL
  Filled 2014-11-26 (×8): qty 1

## 2014-11-26 MED ORDER — SODIUM CHLORIDE 0.9 % IV SOLN
1250.0000 mg | Freq: Two times a day (BID) | INTRAVENOUS | Status: DC
  Administered 2014-11-26 (×2): 1250 mg via INTRAVENOUS
  Filled 2014-11-26 (×4): qty 1250

## 2014-11-26 MED ORDER — ACETAMINOPHEN 325 MG PO TABS
650.0000 mg | ORAL_TABLET | Freq: Once | ORAL | Status: AC
  Administered 2014-11-26: 650 mg via ORAL
  Filled 2014-11-26: qty 2

## 2014-11-26 MED ORDER — SODIUM CHLORIDE 0.9 % IJ SOLN
10.0000 mL | INTRAMUSCULAR | Status: DC | PRN
  Administered 2014-11-28 – 2014-12-04 (×4): 10 mL
  Filled 2014-11-26 (×4): qty 40

## 2014-11-26 MED ORDER — RIFAMPIN 300 MG PO CAPS
300.0000 mg | ORAL_CAPSULE | Freq: Two times a day (BID) | ORAL | Status: DC
  Administered 2014-11-27 – 2014-12-04 (×15): 300 mg via ORAL
  Filled 2014-11-26 (×15): qty 1

## 2014-11-26 NOTE — Progress Notes (Signed)
Utilization review completed.  

## 2014-11-26 NOTE — Progress Notes (Signed)
CRITICAL VALUE ALERT  Critical value received:  Synovial fluid - abundant WBC, gram + cocci in clusters in chains  Date of notification:  07/31  Time of notification:  21:30  Critical value read back: Yes  Nurse who received alert: Adaline Sill  MD notified (1st page):  M. Lynch  Time of first page:  22:45  MD notified (2nd page):  Time of second page:  Responding MD:  M. Lynch  Time MD responded: 23:00

## 2014-11-26 NOTE — Progress Notes (Signed)
Pt scopolamine patch noted sticking on her dressing on Rt hip. Pt denies any n/v at this moment. Will continue to monitor.

## 2014-11-26 NOTE — Progress Notes (Signed)
Peripherally Inserted Central Catheter/Midline Placement  The IV Nurse has discussed with the patient and/or persons authorized to consent for the patient, the purpose of this procedure and the potential benefits and risks involved with this procedure.  The benefits include less needle sticks, lab draws from the catheter and patient may be discharged home with the catheter.  Risks include, but not limited to, infection, bleeding, blood clot (thrombus formation), and puncture of an artery; nerve damage and irregular heat beat.  Alternatives to this procedure were also discussed.  PICC/Midline Placement Documentation  PICC / Midline Single Lumen 93/73/42 PICC Right Basilic 39 cm 0 cm (Active)  Exposed Catheter (cm) 0 cm 11/26/2014  9:24 AM  Dressing Change Due 12/03/14 11/26/2014  9:24 AM       [name], Maricela Bo 11/26/2014, 9:24 AM

## 2014-11-26 NOTE — Consult Note (Signed)
[place] for Infectious Disease    Date of Admission:  11/23/2014  Date of Consult:  11/26/2014  Reason for Consult: infected prosthetic hip Referring Physician: Dr. Ninfa Linden   HPI: Christina French is an 59 y.o. 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 She had recent excision of melanoma at [facility] and was treated with keflex for possible infection though Dermatology note seemed fairly skeptical of infection. She now has developed fevers, pain and a large fluid collection that is + for staphylococcus aureus. She had 3 loose bm yesterday and had testing for C difficile PCR which was + (it does not appear that new C diff protocol was deployed). Today she is actually without any bowel movements when I examined her     Past Medical History  Diagnosis Date  . Hypertension   . Anemia   . Anxiety   . Depression   . Seizures     rare - last one 07/30/14 - was very anemic  . GERD (gastroesophageal reflux disease)   . IBS (irritable bowel syndrome)   . Barrett esophagus   . Vertigo     thinks related to sinus issues  . Renal insufficiency   . Cancer     Past Surgical History  Procedure Laterality Date  . Abdominal hysterectomy    . Hemorrhoid surgery    . Joint replacement Right 2007    hip  . Cyst excision  2011    from throat  . Cesarean section    . Colonoscopy N/A 08/31/2014    Procedure: COLONOSCOPY;  Surgeon: Lucilla Lame, MD;  Location: [facility];  Service: Gastroenterology;  Laterality: N/A;  . Esophagogastroduodenoscopy N/A 08/31/2014    Procedure: ESOPHAGOGASTRODUODENOSCOPY (EGD);  Surgeon: Lucilla Lame, MD;  Location: [facility];  Service: Gastroenterology;  Laterality: N/A;  . Incision and drainage hip Right 11/25/2014    Procedure: IRRIGATION  AND DRAINAGEOF RIGHT HIP WITH PLACEMENT OF ANTIBIOUTIC BEADS;  Surgeon: Mcarthur Rossetti, MD;  Location: [place];  Service: Orthopedics;  Laterality:  Right;  ergies:   No Known Allergies   Medications: I have reviewed patients current medications as documented in Epic Anti-infectives    Start     Dose/Rate Route Frequency Ordered Stop   11/26/14 1200  vancomycin (VANCOCIN) 1,250 mg in sodium chloride 0.9 % 250 mL IVPB     1,250 mg 166.7 mL/hr over 90 Minutes Intravenous Every 12 hours 11/26/14 0017     11/26/14 0030  vancomycin (VANCOCIN) 1,250 mg in sodium chloride 0.9 % 250 mL IVPB     1,250 mg 166.7 mL/hr over 90 Minutes Intravenous NOW 11/26/14 0017 11/26/14 0315   11/25/14 1756  vancomycin (VANCOCIN) powder  Status:  Discontinued       As needed 11/25/14 1756 11/25/14 1814   11/25/14 1754  gentamicin (GARAMYCIN) injection  Status:  Discontinued       As needed 11/25/14 1755 11/25/14 1814   11/25/14 1700  vancomycin (VANCOCIN) IVPB 1000 mg/200 mL premix  Status:  Discontinued     1,000 mg 200 mL/hr over 60 Minutes Intravenous Every 12 hours 11/25/14 0916 11/25/14 1113   11/25/14 1500  vancomycin (VANCOCIN) 50 mg/mL oral solution 125 mg     125 mg Oral 4 times daily 11/25/14 1415 12/09/14 1359   11/24/14 1200  piperacillin-tazobactam (ZOSYN) IVPB 3.375 g  Status:  Discontinued     3.375 g  12.5 mL/hr over 240 Minutes Intravenous 3 times per day 11/24/14 0210 11/25/14 1113   11/24/14 0700  vancomycin (VANCOCIN) IVPB 750 mg/150 ml premix  Status:  Discontinued     750 mg 150 mL/hr over 60 Minutes Intravenous Every 12 hours 11/24/14 0210 11/25/14 0916   11/24/14 0300  piperacillin-tazobactam (ZOSYN) IVPB 3.375 g     3.375 g 100 mL/hr over 30 Minutes Intravenous  Once 11/24/14 0210 11/24/14 0858      Social History:  reports that she has never smoked. She does not have any smokeless tobacco history on file. She reports that she does not drink alcohol. Her drug history is not on file.  No family history on file.  As in HPI and primary teams notes otherwise 12 point review of systems is negative  Blood pressure 147/71, pulse  95, temperature 100.7 F (38.2 C), temperature source Oral, resp. rate 17, weight 200 lb (90.719 kg), SpO2 98 %. General: Alert and awake, oriented x3, not in any acute distress. HEENT: anicteric sclera,EOMI, oropharynx clear and without exudate CVS regular rate, normal r,  no murmur rubs or gallops Chest: clear to auscultation bilaterally, no wheezing, rales or rhonchi Abdomen: soft nontender, nondistended, normal bowel sounds, Extremities: dressing in place , PICC in place Neuro: nonfocal, strength and sensation intact   Results for orders placed or performed during the hospital encounter of 11/23/14 (from the past 48 hour(s))  Stool culture     Status: None (Preliminary result)   Collection Time: 11/24/14 10:16 PM  Result Value Ref Range   Specimen Description STOOL    Special Requests NONE    Culture      Culture reincubated for better growth Performed at [facility]    Report Status PENDING   Clostridium Difficile by PCR (not at [facility])     Status: Abnormal   Collection Time: 11/24/14 10:17 PM  Result Value Ref Range   C difficile by pcr POSITIVE (A) NEGATIVE    Comment: CRITICAL RESULT CALLED TO, READ BACK BY AND VERIFIED WITH: Leonia Corona, RN AT 1401 11/25/14 BY A DAVIS   CBC     Status: Abnormal   Collection Time: 11/25/14  4:36 AM  Result Value Ref Range   WBC 13.1 (H) 4.0 - 10.5 K/uL   RBC 3.67 (L) 3.87 - 5.11 MIL/uL   Hemoglobin 10.2 (L) 12.0 - 15.0 g/dL   HCT 30.0 (L) 36.0 - 46.0 %   MCV 81.7 78.0 - 100.0 fL   MCH 27.8 26.0 - 34.0 pg   MCHC 34.0 30.0 - 36.0 g/dL   RDW 15.4 11.5 - 15.5 %   Platelets 126 (L) 150 - 400 K/uL  Basic metabolic panel     Status: Abnormal   Collection Time: 11/25/14  4:36 AM  Result Value Ref Range   Sodium 125 (L) 135 - 145 mmol/L   Potassium 3.6 3.5 - 5.1 mmol/L   Chloride 94 (L) 101 - 111 mmol/L   CO2 20 (L) 22 - 32 mmol/L   Glucose, Bld 164 (H) 65 - 99 mg/dL   BUN 8 6 - 20 mg/dL   Creatinine, Ser 0.86 0.44 - 1.00 mg/dL     Calcium 8.5 (L) 8.9 - 10.3 mg/dL   GFR calc non Af Amer >60 >60 mL/min   GFR calc Af Amer >60 >60 mL/min    Comment: (NOTE) The eGFR has been calculated using the CKD EPI equation. This calculation has not been validated in all clinical  situations. eGFR's persistently <60 mL/min signify possible Chronic Kidney Disease.    Anion gap 11 5 - 15  Surgical pcr screen     Status: None   Collection Time: 11/25/14  3:45 PM  Result Value Ref Range   MRSA, PCR NEGATIVE NEGATIVE   Staphylococcus aureus NEGATIVE NEGATIVE    Comment:        The Xpert SA Assay (FDA approved for NASAL specimens in patients over 37 years of age), is one component of a comprehensive surveillance program.  Test performance has been validated by [facility] for patients greater than or equal to 89 year old. It is not intended to diagnose infection nor to guide or monitor treatment.   Synovial cell count + diff, w/ crystals     Status: Abnormal   Collection Time: 11/25/14  4:51 PM  Result Value Ref Range   Color, Synovial BROWN (A) YELLOW   Appearance-Synovial CLOUDY (A) CLEAR   Crystals, Fluid NO CRYSTALS SEEN    WBC, Synovial 161096 (H) 0 - 200 /cu mm   Neutrophil, Synovial 87 (H) 0 - 25 %   Lymphocytes-Synovial Fld 7 0 - 20 %   Monocyte-Macrophage-Synovial Fluid 5 (L) 50 - 90 %   Eosinophils-Synovial 1 0 - 1 %   Other Cells-SYN      INTRA AND EXTRACELLULAR ORGANISMS PRESENT, CORRELATE WITH MICROBIOLOGY, [name] RN [phone] 2120 GREEN R  Body fluid culture     Status: None (Preliminary result)   Collection Time: 11/25/14  4:51 PM  Result Value Ref Range   Specimen Description SYNOVIAL RIGHT HIP    Special Requests SYNOVIAL RIGHT HIP    Gram Stain      ABUNDANT WBC PRESENT,BOTH PMN AND MONONUCLEAR FEW GRAM POSITIVE COCCI IN CLUSTERS IN CHAINS CRITICAL RESULT CALLED TO, READ BACK BY AND VERIFIED WITH: RIMANDO,D RN 11/25/14 2100 [place] CONFIRMED BY V Montel Culver    Culture ABUNDANT  STAPHYLOCOCCUS AUREUS    Report Status PENDING   Anaerobic culture     Status: None (Preliminary result)   Collection Time: 11/25/14  4:54 PM  Result Value Ref Range   Specimen Description ABSCESS RIGHT HIP    Special Requests NONE    Gram Stain      FEW WBC PRESENT, PREDOMINANTLY PMN NO SQUAMOUS EPITHELIAL CELLS SEEN FEW GRAM POSITIVE COCCI IN PAIRS IN CLUSTERS Performed at [facility]    Culture      NO ANAEROBES ISOLATED; CULTURE IN PROGRESS FOR 5 DAYS Performed at [facility]    Report Status PENDING   Culture, routine-abscess     Status: None (Preliminary result)   Collection Time: 11/25/14  4:54 PM  Result Value Ref Range   Specimen Description ABSCESS RIGHT HIP    Special Requests NONE    Gram Stain      FEW WBC PRESENT,BOTH PMN AND MONONUCLEAR NO SQUAMOUS EPITHELIAL CELLS SEEN FEW GRAM POSITIVE COCCI IN PAIRS IN CLUSTERS Performed at [facility]    Culture      Culture reincubated for better growth Performed at [facility]    Report Status PENDING   Tissue culture     Status: None (Preliminary result)   Collection Time: 11/25/14  5:43 PM  Result Value Ref Range   Specimen Description TISSUE RIGHT HIP    Special Requests NONE    Gram Stain      MODERATE WBC PRESENT,BOTH PMN AND MONONUCLEAR ABUNDANT GRAM POSITIVE COCCI IN PAIRS Gram Stain Report  Called to,Read Back By and Verified With: Gram Stain Report Called to,Read Back By and Verified With: Y.Rimando RN on 11/26/14 at 02:58 by Rise Mu Performed at [place] reincubated for better growth Performed at [facility]    Report Status PENDING   CBC     Status: Abnormal   Collection Time: 11/26/14  4:44 AM  Result Value Ref Range   WBC 7.4 4.0 - 10.5 K/uL   RBC 2.89 (L) 3.87 - 5.11 MIL/uL   Hemoglobin 8.2 (L) 12.0 - 15.0 g/dL   HCT 23.5 (L) 36.0 - 46.0 %   MCV 81.3 78.0 - 100.0 fL   MCH 28.4 26.0 - 34.0 pg   MCHC 34.9 30.0  - 36.0 g/dL   RDW 15.1 11.5 - 15.5 %   Platelets 120 (L) 150 - 400 K/uL  Basic metabolic panel     Status: Abnormal   Collection Time: 11/26/14  4:44 AM  Result Value Ref Range   Sodium 125 (L) 135 - 145 mmol/L   Potassium 3.3 (L) 3.5 - 5.1 mmol/L   Chloride 95 (L) 101 - 111 mmol/L   CO2 24 22 - 32 mmol/L   Glucose, Bld 148 (H) 65 - 99 mg/dL   BUN 8 6 - 20 mg/dL   Creatinine, Ser 0.86 0.44 - 1.00 mg/dL   Calcium 8.2 (L) 8.9 - 10.3 mg/dL   GFR calc non Af Amer >60 >60 mL/min   GFR calc Af Amer >60 >60 mL/min    Comment: (NOTE) The eGFR has been calculated using the CKD EPI equation. This calculation has not been validated in all clinical situations. eGFR's persistently <60 mL/min signify possible Chronic Kidney Disease.    Anion gap 6 5 - 15   _0 (sdes,specrequest,cult,reptstatus)   ) Recent Results (from the past 720 hour(s))  Blood culture (routine x 2)     Status: None (Preliminary result)   Collection Time: 11/23/14  1:32 PM  Result Value Ref Range Status   Specimen Description BLOOD RIGHT HAND  Final   Special Requests BOTTLES DRAWN AEROBIC AND ANAEROBIC  1CC  Final   Culture NO GROWTH 3 DAYS  Final   Report Status PENDING  Incomplete  Blood culture (routine x 2)     Status: None (Preliminary result)   Collection Time: 11/23/14  2:14 PM  Result Value Ref Range Status   Specimen Description BLOOD LEFT ASSIST CONTROL  Final   Special Requests   Final    BOTTLES DRAWN AEROBIC AND ANAEROBIC  AER [place] ANA [place]   Culture NO GROWTH 3 DAYS  Final   Report Status PENDING  Incomplete  Culture, blood (x 2)     Status: None (Preliminary result)   Collection Time: 11/24/14  2:55 PM  Result Value Ref Range Status   Specimen Description BLOOD LEFT ARM  Final   Special Requests IN PEDIATRIC BOTTLE 3CC  Final   Culture NO GROWTH 2 DAYS  Final   Report Status PENDING  Incomplete  Culture, blood (x 2)     Status: None (Preliminary result)   Collection Time: 11/24/14   3:13 PM  Result Value Ref Range Status   Specimen Description BLOOD RIGHT ANTECUBITAL  Final   Special Requests BOTTLES DRAWN AEROBIC ONLY 5CC  Final   Culture NO GROWTH 2 DAYS  Final   Report Status PENDING  Incomplete  Urine culture     Status: None  Collection Time: 11/24/14  4:00 PM  Result Value Ref Range Status   Specimen Description URINE, RANDOM  Final   Special Requests ADDED 2213  Final   Culture NO GROWTH 2 DAYS  Final   Report Status 11/26/2014 FINAL  Final  Stool culture     Status: None (Preliminary result)   Collection Time: 11/24/14 10:16 PM  Result Value Ref Range Status   Specimen Description STOOL  Final   Special Requests NONE  Final   Culture   Final    Culture reincubated for better growth Performed at [facility]    Report Status PENDING  Incomplete  Clostridium Difficile by PCR (not at [facility]     Status: Abnormal   Collection Time: 11/24/14 10:17 PM  Result Value Ref Range Status   C difficile by pcr POSITIVE (A) NEGATIVE Final    Comment: CRITICAL RESULT CALLED TO, READ BACK BY AND VERIFIED WITH: Leonia Corona, RN AT 1401 11/25/14 BY A DAVIS   Surgical pcr screen     Status: None   Collection Time: 11/25/14  3:45 PM  Result Value Ref Range Status   MRSA, PCR NEGATIVE NEGATIVE Final   Staphylococcus aureus NEGATIVE NEGATIVE Final    Comment:        The Xpert SA Assay (FDA approved for NASAL specimens in patients over 22 years of age), is one component of a comprehensive surveillance program.  Test performance has been validated by [facility] for patients greater than or equal to 98 year old. It is not intended to diagnose infection nor to guide or monitor treatment.   Body fluid culture     Status: None (Preliminary result)   Collection Time: 11/25/14  4:51 PM  Result Value Ref Range Status   Specimen Description SYNOVIAL RIGHT HIP  Final   Special Requests SYNOVIAL RIGHT HIP  Final   Gram Stain   Final    ABUNDANT WBC PRESENT,BOTH  PMN AND MONONUCLEAR FEW GRAM POSITIVE COCCI IN CLUSTERS IN CHAINS CRITICAL RESULT CALLED TO, READ BACK BY AND VERIFIED WITH: RIMANDO,D RN 11/25/14 2100 [place] CONFIRMED BY Lavenia Atlas    Culture ABUNDANT STAPHYLOCOCCUS AUREUS  Final   Report Status PENDING  Incomplete  Anaerobic culture     Status: None (Preliminary result)   Collection Time: 11/25/14  4:54 PM  Result Value Ref Range Status   Specimen Description ABSCESS RIGHT HIP  Final   Special Requests NONE  Final   Gram Stain   Final    FEW WBC PRESENT, PREDOMINANTLY PMN NO SQUAMOUS EPITHELIAL CELLS SEEN FEW GRAM POSITIVE COCCI IN PAIRS IN CLUSTERS Performed at [facility]    Culture   Final    NO ANAEROBES ISOLATED; CULTURE IN PROGRESS FOR 5 DAYS Performed at [facility]    Report Status PENDING  Incomplete  Culture, routine-abscess     Status: None (Preliminary result)   Collection Time: 11/25/14  4:54 PM  Result Value Ref Range Status   Specimen Description ABSCESS RIGHT HIP  Final   Special Requests NONE  Final   Gram Stain   Final    FEW WBC PRESENT,BOTH PMN AND MONONUCLEAR NO SQUAMOUS EPITHELIAL CELLS SEEN FEW GRAM POSITIVE COCCI IN PAIRS IN CLUSTERS Performed at [facility]    Culture   Final    Culture reincubated for better growth Performed at [facility]    Report Status PENDING  Incomplete  Tissue culture     Status: None (Preliminary  result)   Collection Time: 11/25/14  5:43 PM  Result Value Ref Range Status   Specimen Description TISSUE RIGHT HIP  Final   Special Requests NONE  Final   Gram Stain   Final    MODERATE WBC PRESENT,BOTH PMN AND MONONUCLEAR ABUNDANT GRAM POSITIVE COCCI IN PAIRS Gram Stain Report Called to,Read Back By and Verified With: Gram Stain Report Called to,Read Back By and Verified With: Y.Rimando RN on 11/26/14 at 02:58 by Rise Mu Performed at [place]   Final    Culture reincubated for better growth Performed at  [facility]    Report Status PENDING  Incomplete     Impression/Recommendation  Principal Problem:   Acute pain of right hip, right calf pain, ?sepsis Active Problems:   Right hip pain   Hyponatremia   BP (high blood pressure)   KIM OKI is a 59 y.o. 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 CDI  #1 Staph aureus prosthetic hip infection: --fine to continue vancomycin for now --will add rifampin with am meds given persistence of prosthesis and difficulty eradicating Staph in setting of hardware --check ESR, CRp  #2 ? C difficile Per patient 3-4 loose bm yesterday but none since. I think her + C diff PCR may reflect colonization but we will go ahead and finish a 14 day course of treatment with oral vancomycin  #3 Screening: check HIV and Hep panel     11/26/2014, 8:36 PM   Thank you so much for this interesting consult  [place] [facility] [place] [phone] (pager) [phone] (office) 11/26/2014, 8:36 PM  Rhina [place] 11/26/2014, 8:36 PM

## 2014-11-26 NOTE — Progress Notes (Signed)
ANTIBIOTIC CONSULT NOTE - FOLLOW UP  Pharmacy Consult:  Vancomycin  Indication: GPC in R hip synovial fluid  No Known Allergies  Patient Measurements: Weight: 200 lb (90.719 kg)  Ht: 61 inches  Vital Signs: Temp: 99.3 F (37.4 C) (07/31 2007) Temp Source: Oral (07/31 2007) BP: 117/68 mmHg (07/31 2007) Pulse Rate: 82 (07/31 2007) Intake/Output from previous day: 07/31 0701 - 08/01 0700 In: 1030 [P.O.:480; I.V.:550] Out: 225 [Urine:150; Blood:75]  Labs:  Recent Labs  11/24/14 0405 11/24/14 1455 11/25/14 0436  WBC 15.8* 21.1* 13.1*  HGB 10.3* 11.5* 10.2*  PLT 123* 150 126*  CREATININE 1.14* 1.08* 0.86   Estimated Creatinine Clearance: 74.1 mL/min (by C-G formula based on Cr of 0.86). No results for input(s): VANCOTROUGH, VANCOPEAK, VANCORANDOM, GENTTROUGH, GENTPEAK, GENTRANDOM, TOBRATROUGH, TOBRAPEAK, TOBRARND, AMIKACINPEAK, AMIKACINTROU, AMIKACIN in the last 72 hours.   Microbiology: Recent Results (from the past 720 hour(s))  Blood culture (routine x 2)     Status: None (Preliminary result)   Collection Time: 11/23/14  1:32 PM  Result Value Ref Range Status   Specimen Description BLOOD RIGHT HAND  Final   Special Requests BOTTLES DRAWN AEROBIC AND ANAEROBIC  1CC  Final   Culture NO GROWTH 2 DAYS  Final   Report Status PENDING  Incomplete  Blood culture (routine x 2)     Status: None (Preliminary result)   Collection Time: 11/23/14  2:14 PM  Result Value Ref Range Status   Specimen Description BLOOD LEFT ASSIST CONTROL  Final   Special Requests   Final    BOTTLES DRAWN AEROBIC AND ANAEROBIC  AER [place] ANA [place]   Culture NO GROWTH 2 DAYS  Final   Report Status PENDING  Incomplete  Culture, blood (x 2)     Status: None (Preliminary result)   Collection Time: 11/24/14  2:55 PM  Result Value Ref Range Status   Specimen Description BLOOD LEFT ARM  Final   Special Requests IN PEDIATRIC BOTTLE 3CC  Final   Culture NO GROWTH < 24 HOURS  Final   Report Status PENDING   Incomplete  Culture, blood (x 2)     Status: None (Preliminary result)   Collection Time: 11/24/14  3:13 PM  Result Value Ref Range Status   Specimen Description BLOOD RIGHT ANTECUBITAL  Final   Special Requests BOTTLES DRAWN AEROBIC ONLY 5CC  Final   Culture NO GROWTH < 24 HOURS  Final   Report Status PENDING  Incomplete  Urine culture     Status: None (Preliminary result)   Collection Time: 11/24/14  4:00 PM  Result Value Ref Range Status   Specimen Description URINE, RANDOM  Final   Special Requests ADDED 2213  Final   Culture NO GROWTH < 24 HOURS  Final   Report Status PENDING  Incomplete  Clostridium Difficile by PCR (not at [facility])     Status: Abnormal   Collection Time: 11/24/14 10:17 PM  Result Value Ref Range Status   C difficile by pcr POSITIVE (A) NEGATIVE Final    Comment: CRITICAL RESULT CALLED TO, READ BACK BY AND VERIFIED WITH: Leonia Corona, RN AT 1401 11/25/14 BY A DAVIS   Surgical pcr screen     Status: None   Collection Time: 11/25/14  3:45 PM  Result Value Ref Range Status   MRSA, PCR NEGATIVE NEGATIVE Final   Staphylococcus aureus NEGATIVE NEGATIVE Final    Comment:        The Xpert SA Assay (FDA approved for NASAL specimens  in patients over 58 years of age), is one component of a comprehensive surveillance program.  Test performance has been validated by [facility] for patients greater than or equal to 58 year old. It is not intended to diagnose infection nor to guide or monitor treatment.   Body fluid culture     Status: None (Preliminary result)   Collection Time: 11/25/14  4:51 PM  Result Value Ref Range Status   Specimen Description SYNOVIAL RIGHT HIP  Final   Special Requests SYNOVIAL RIGHT HIP  Final   Gram Stain   Final    ABUNDANT WBC PRESENT,BOTH PMN AND MONONUCLEAR FEW GRAM POSITIVE COCCI IN CLUSTERS IN CHAINS CRITICAL RESULT CALLED TO, READ BACK BY AND VERIFIED WITH: RIMANDO,D RN 11/25/14 2100 [place] CONFIRMED BY Lavenia Atlas    Culture  PENDING  Incomplete   Report Status PENDING  Incomplete      Assessment: 15 YOF started on vancomycin and Zosyn for sepsis upon admission. Pt also on po vanc for cdiff. IV abx discontinued earlier today. S/p OR for I&D of R hip periprosthetic joint - vanc and gent abx beads placed. GPC growing in synovial fluid. To restart Vancomycin. Last Vancomycin dose 750mg  IV given ~0600 this morning.  Scr 0.86, est Crcl 74 ml/min.  Vanc 7/30 >> Zosyn 7/30 >>7/31  7/30 BCx x2 - ngtd 7/30 UCx - ngtd 7/31 C.diff - positive 7/31 R hip synovial fluid - GPC  Goal of Therapy:  Vancomycin trough level 15-20 mcg/ml  Plan:  - Vanc 1250mg  IV Q12H - Monitor renal fxn, clinical progress, vanc trough as indicated  Sherlon Handing, PharmD, BCPS Clinical pharmacist, pager [phone] 11/26/2014, 12:09 AM

## 2014-11-26 NOTE — Progress Notes (Signed)
Patient ID: Christina French, female   DOB: 1956-04-01, 59 y.o.   MRN: [id number] Feels better overall and less hip pain.  Unfortunately, the cultures do show staph around her hip.  I did place antibiotic stimulin beads with vanc and gent around her hip.  Her diarrhea is better.  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  This is a difficult situation.  Will have PICC line placed and obtain an Infectious Disease consult as well.  Right now, her H/H is low, but tolerating well.

## 2014-11-26 NOTE — Progress Notes (Signed)
PATIENT DETAILS Name: Christina French Age: 59 y.o. Sex: female Date of Birth: 12/21/1955 Admit Date: 11/23/2014 Admitting Physician Mcarthur Rossetti, MD DTO:IZTI, Timoteo Gaul, MD  Subjective: Feels much better. Diarrhea resolved. However febrile overnight.  Assessment/Plan: Principal Problem: Sepsis: secondary to infected right hip periprosthetic collection and C. difficile colitis. Much improved post irrigation and debridement of the right hip. Intraoperative cultures positive for both gram-positive cocci in chains and pairs-continue intravenous vancomycin and oral vancomycin for C. difficile. Sepsis pathophysiology has significantly improved. Await infectious disease recommendations.  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 Follow electrolytes  Hypertension:  controlled-continue atenolol.  Seizure disorder: Continue with carbamazepine-watch sodium.   History of depression with anxiety: Continue with Xanax, Lexapro, Remeron and trazodone-watch sodium.  Hx of Malignant Melanoma in Situ-right leg-followed at [facility]  Disposition: Remain inpatient-suspect may require SNF  Antimicrobial agents  See below  Anti-infectives    Start     Dose/Rate Route Frequency Ordered Stop   11/26/14 1200  vancomycin (VANCOCIN) 1,250 mg in sodium chloride 0.9 % 250 mL IVPB     1,250 mg 166.7 mL/hr over 90 Minutes Intravenous Every 12 hours 11/26/14 0017     11/26/14 0030  vancomycin (VANCOCIN) 1,250 mg in sodium chloride 0.9 % 250 mL IVPB     1,250 mg 166.7 mL/hr over 90 Minutes Intravenous NOW 11/26/14 0017 11/26/14 0315   11/25/14 1756  vancomycin (VANCOCIN) powder  Status:  Discontinued       As needed 11/25/14 1756 11/25/14 1814   11/25/14 1754  gentamicin (GARAMYCIN) injection  Status:  Discontinued       As needed 11/25/14 1755 11/25/14 1814   11/25/14 1700  vancomycin (VANCOCIN) IVPB 1000 mg/200 mL premix  Status:  Discontinued     1,000  mg 200 mL/hr over 60 Minutes Intravenous Every 12 hours 11/25/14 0916 11/25/14 1113   11/25/14 1500  vancomycin (VANCOCIN) 50 mg/mL oral solution 125 mg     125 mg Oral 4 times daily 11/25/14 1415 12/09/14 1359   11/24/14 1200  piperacillin-tazobactam (ZOSYN) IVPB 3.375 g  Status:  Discontinued     3.375 g 12.5 mL/hr over 240 Minutes Intravenous 3 times per day 11/24/14 0210 11/25/14 1113   11/24/14 0700  vancomycin (VANCOCIN) IVPB 750 mg/150 ml premix  Status:  Discontinued     750 mg 150 mL/hr over 60 Minutes Intravenous Every 12 hours 11/24/14 0210 11/25/14 0916   11/24/14 0300  piperacillin-tazobactam (ZOSYN) IVPB 3.375 g     3.375 g 100 mL/hr over 30 Minutes Intravenous  Once 11/24/14 0210 11/24/14 0858     DVT Prophylaxis: Prophylactic Lovenox   Code Status: Full code   Family Communication None at bedside  Procedures: None  CONSULTS:  None  Time spent 30 minutes-Greater than 50% of this time was spent in counseling, explanation of diagnosis, planning of further management, and coordination of care.  MEDICATIONS: Scheduled Meds: . acidophilus  1 capsule Oral Daily  . atenolol  50 mg Oral BID  . carbamazepine  300 mg Oral BID  . enoxaparin (LOVENOX) injection  40 mg Subcutaneous Q24H  . escitalopram  20 mg Oral Daily  . ibuprofen  800 mg Oral Once  . mirtazapine  22.5 mg Oral QHS  . ondansetron (ZOFRAN) IV  4 mg Intravenous TID AC  . pantoprazole  40 mg Oral Daily  . traZODone  50 mg Oral QHS  .  vancomycin  1,250 mg Intravenous Q12H  . vancomycin  125 mg Oral QID   Continuous Infusions:  PRN Meds:.ALPRAZolam, diphenhydrAMINE, HYDROmorphone (DILAUDID) injection, HYDROmorphone (DILAUDID) injection, meperidine (DEMEROL) injection, methocarbamol, oxyCODONE, promethazine, sodium chloride    PHYSICAL EXAM: Vital signs in last 24 hours: Filed Vitals:   11/25/14 2007 11/26/14 0038 11/26/14 0230 11/26/14 0536  BP: 117/68 130/58  102/55  Pulse: 82 87  75  Temp:  99.3 F (37.4 C) 101.1 F (38.4 C) 98.4 F (36.9 C) 98.7 F (37.1 C)  TempSrc: Oral Oral Oral Oral  Resp: 16 16  16   Weight:      SpO2: 95% 97%  98%    Weight change:  Filed Weights   11/24/14 0018  Weight: 90.719 kg (200 lb)   Body mass index is 37.81 kg/(m^2).   Gen Exam: Awake and alert with clear speech.   Neck: Supple, No JVD.   Chest: B/L Clear.   CVS: S1 S2 Regular, no murmurs.  Abdomen: soft, BS +, non tender, non distended.  Extremities: no edema, lower extremities warm to touch. Neurologic: Non Focal.   Skin: No Rash.   Wounds: N/A.   Intake/Output from previous day:  Intake/Output Summary (Last 24 hours) at 11/26/14 1137 Last data filed at 11/25/14 1900  Gross per 24 hour  Intake    790 ml  Output    225 ml  Net    565 ml     LAB RESULTS: CBC  Recent Labs Lab 11/23/14 1245 11/24/14 0405 11/24/14 1455 11/25/14 0436 11/26/14 0444  WBC 22.0* 15.8* 21.1* 13.1* 7.4  HGB 11.6* 10.3* 11.5* 10.2* 8.2*  HCT 34.2* 30.3* 34.0* 30.0* 23.5*  PLT 160 123* 150 126* 120*  MCV 81.5 81.5 83.1 81.7 81.3  MCH 27.7 27.7 28.1 27.8 28.4  MCHC 34.0 34.0 33.8 34.0 34.9  RDW 17.8* 16.0* 15.7* 15.4 15.1  LYMPHSABS 0.8*  --  0.7  --   --   MONOABS 1.9*  --  1.8*  --   --   EOSABS 0.0  --  0.0  --   --   BASOSABS 0.1  --  0.0  --   --     Chemistries   Recent Labs Lab 11/23/14 1245 11/24/14 0405 11/24/14 1455 11/25/14 0436 11/26/14 0444  NA 127* 124* 123* 125* 125*  K 4.1 3.4* 3.5 3.6 3.3*  CL 90* 89* 87* 94* 95*  CO2 25 25 24  20* 24  GLUCOSE 167* 143* 167* 164* 148*  BUN 16 12 10 8 8   CREATININE 1.22* 1.14* 1.08* 0.86 0.86  CALCIUM 8.7* 8.0* 8.4* 8.5* 8.2*    CBG: No results for input(s): GLUCAP in the last 168 hours.  GFR Estimated Creatinine Clearance: 74.1 mL/min (by C-G formula based on Cr of 0.86).  Coagulation profile  Recent Labs Lab 11/24/14 1455  INR 1.40    Cardiac Enzymes No results for input(s): CKMB, TROPONINI, MYOGLOBIN in  the last 168 hours.  Invalid input(s): CK  Invalid input(s): POCBNP No results for input(s): DDIMER in the last 72 hours. No results for input(s): HGBA1C in the last 72 hours. No results for input(s): CHOL, HDL, LDLCALC, TRIG, CHOLHDL, LDLDIRECT in the last 72 hours. No results for input(s): TSH, T4TOTAL, T3FREE, THYROIDAB in the last 72 hours.  Invalid input(s): FREET3 No results for input(s): VITAMINB12, FOLATE, FERRITIN, TIBC, IRON, RETICCTPCT in the last 72 hours. No results for input(s): LIPASE, AMYLASE in the last 72 hours.  Urine Studies No results  for input(s): UHGB, CRYS in the last 72 hours.  Invalid input(s): UACOL, UAPR, USPG, UPH, UTP, UGL, UKET, UBIL, UNIT, UROB, ULEU, UEPI, UWBC, URBC, UBAC, CAST, UCOM, BILUA  MICROBIOLOGY: Recent Results (from the past 240 hour(s))  Blood culture (routine x 2)     Status: None (Preliminary result)   Collection Time: 11/23/14  1:32 PM  Result Value Ref Range Status   Specimen Description BLOOD RIGHT HAND  Final   Special Requests BOTTLES DRAWN AEROBIC AND ANAEROBIC  1CC  Final   Culture NO GROWTH 3 DAYS  Final   Report Status PENDING  Incomplete  Blood culture (routine x 2)     Status: None (Preliminary result)   Collection Time: 11/23/14  2:14 PM  Result Value Ref Range Status   Specimen Description BLOOD LEFT ASSIST CONTROL  Final   Special Requests   Final    BOTTLES DRAWN AEROBIC AND ANAEROBIC  AER [place] ANA [place]   Culture NO GROWTH 3 DAYS  Final   Report Status PENDING  Incomplete  Culture, blood (x 2)     Status: None (Preliminary result)   Collection Time: 11/24/14  2:55 PM  Result Value Ref Range Status   Specimen Description BLOOD LEFT ARM  Final   Special Requests IN PEDIATRIC BOTTLE 3CC  Final   Culture NO GROWTH < 24 HOURS  Final   Report Status PENDING  Incomplete  Culture, blood (x 2)     Status: None (Preliminary result)   Collection Time: 11/24/14  3:13 PM  Result Value Ref Range Status   Specimen Description  BLOOD RIGHT ANTECUBITAL  Final   Special Requests BOTTLES DRAWN AEROBIC ONLY 5CC  Final   Culture NO GROWTH < 24 HOURS  Final   Report Status PENDING  Incomplete  Urine culture     Status: None   Collection Time: 11/24/14  4:00 PM  Result Value Ref Range Status   Specimen Description URINE, RANDOM  Final   Special Requests ADDED 2213  Final   Culture NO GROWTH 2 DAYS  Final   Report Status 11/26/2014 FINAL  Final  Stool culture     Status: None (Preliminary result)   Collection Time: 11/24/14 10:16 PM  Result Value Ref Range Status   Specimen Description STOOL  Final   Special Requests NONE  Final   Culture   Final    Culture reincubated for better growth Performed at [facility]    Report Status PENDING  Incomplete  Clostridium Difficile by PCR (not at [facility])     Status: Abnormal   Collection Time: 11/24/14 10:17 PM  Result Value Ref Range Status   C difficile by pcr POSITIVE (A) NEGATIVE Final    Comment: CRITICAL RESULT CALLED TO, READ BACK BY AND VERIFIED WITH: Leonia Corona, RN AT 1401 11/25/14 BY A DAVIS   Surgical pcr screen     Status: None   Collection Time: 11/25/14  3:45 PM  Result Value Ref Range Status   MRSA, PCR NEGATIVE NEGATIVE Final   Staphylococcus aureus NEGATIVE NEGATIVE Final    Comment:        The Xpert SA Assay (FDA approved for NASAL specimens in patients over 44 years of age), is one component of a comprehensive surveillance program.  Test performance has been validated by [facility] for patients greater than or equal to 53 year old. It is not intended to diagnose infection nor to guide or monitor treatment.   Body fluid culture  Status: None (Preliminary result)   Collection Time: 11/25/14  4:51 PM  Result Value Ref Range Status   Specimen Description SYNOVIAL RIGHT HIP  Final   Special Requests SYNOVIAL RIGHT HIP  Final   Gram Stain   Final    ABUNDANT WBC PRESENT,BOTH PMN AND MONONUCLEAR FEW GRAM POSITIVE COCCI IN CLUSTERS IN  CHAINS CRITICAL RESULT CALLED TO, READ BACK BY AND VERIFIED WITH: RIMANDO,D RN 11/25/14 2100 [place] CONFIRMED BY Lavenia Atlas    Culture PENDING  Incomplete   Report Status PENDING  Incomplete  Anaerobic culture     Status: None (Preliminary result)   Collection Time: 11/25/14  4:54 PM  Result Value Ref Range Status   Specimen Description ABSCESS RIGHT HIP  Final   Special Requests NONE  Final   Gram Stain   Final    FEW WBC PRESENT, PREDOMINANTLY PMN NO SQUAMOUS EPITHELIAL CELLS SEEN FEW GRAM POSITIVE COCCI IN PAIRS IN CLUSTERS Performed at [facility]    Culture PENDING  Incomplete   Report Status PENDING  Incomplete  Culture, routine-abscess     Status: None (Preliminary result)   Collection Time: 11/25/14  4:54 PM  Result Value Ref Range Status   Specimen Description ABSCESS RIGHT HIP  Final   Special Requests NONE  Final   Gram Stain   Final    FEW WBC PRESENT,BOTH PMN AND MONONUCLEAR NO SQUAMOUS EPITHELIAL CELLS SEEN FEW GRAM POSITIVE COCCI IN PAIRS IN CLUSTERS Performed at [facility]    Culture PENDING  Incomplete   Report Status PENDING  Incomplete  Tissue culture     Status: None (Preliminary result)   Collection Time: 11/25/14  5:43 PM  Result Value Ref Range Status   Specimen Description TISSUE RIGHT HIP  Final   Special Requests NONE  Final   Gram Stain   Final    MODERATE WBC PRESENT,BOTH PMN AND MONONUCLEAR ABUNDANT GRAM POSITIVE COCCI IN PAIRS Gram Stain Report Called to,Read Back By and Verified With: Gram Stain Report Called to,Read Back By and Verified With: Y.Rimando RN on 11/26/14 at 02:58 by Rise Mu Performed at  [facility]    Culture PENDING  Incomplete   Report Status PENDING  Incomplete    RADIOLOGY STUDIES/RESULTS: Dg Chest 2 View  11/23/2014   CLINICAL DATA:  Fever  EXAM: CHEST  2 VIEW  COMPARISON:  05/29/2005  FINDINGS: Cardiac enlargement without heart failure. Vascularity is normal. Lungs are clear without  infiltrate effusion or mass. No change from the prior study.  IMPRESSION: No active cardiopulmonary disease.   Electronically Signed   By: Franchot Gallo M.D.   On: 11/23/2014 13:11   Ct Chest W Contrast  11/24/2014   CLINICAL DATA:  Nonproductive cough for 2 months. Decreased appetite. Sepsis.  EXAM: CT CHEST WITH CONTRAST  TECHNIQUE: Multidetector CT imaging of the chest was performed during intravenous contrast administration.  CONTRAST:  37mL OMNIPAQUE IOHEXOL 300 MG/ML  SOLN  COMPARISON:  Chest x-ray 11/23/2014  FINDINGS: Linear scarring in the lung bases. Lungs are otherwise clear. No pleural effusions.  Heart is normal size. Aorta is normal caliber. No mediastinal, hilar, or axillary adenopathy. Chest wall soft tissues are unremarkable.  Imaging into the upper abdomen shows no acute findings. No acute bony abnormality or focal bone lesion.  IMPRESSION: No acute cardiopulmonary disease.   Electronically Signed   By: Rolm Baptise M.D.   On: 11/24/2014 17:01   Mr Hip Right Wo Contrast  11/25/2014  CLINICAL DATA:  Severe pain. RIGHT hip pain. Infected RIGHT hip arthroplasty.  EXAM: MR OF THE RIGHT HIP WITHOUT CONTRAST  TECHNIQUE: Multiplanar, multisequence MR imaging was performed. No intravenous contrast was administered.  COMPARISON:  Hip aspiration 11/24/2014.  CT 12/04/2010.  FINDINGS: RIGHT total hip arthroplasty is present. Tomi Bamberger protocol was employed. There is heterogeneous fluid collection dorsal to the RIGHT hip. This measures 62 mm transverse by 37 mm AP. On sagittal imaging this measures 8 cm. This is bilobed and has a rind of low signal material, which may represent calcification or clot. Because the hip joint is obscured by artifact from arthroplasty, it is unclear whether this communicates with the hip joint itself. Atrophy of the deep RIGHT gluteus maximus muscle is present adjacent to the fluid collection. Visible portions of the LEFT obturator ring appear intact. Lumbar spondylosis is  present with L4-L5 moderate central stenosis. Contralateral LEFT hip appears normal. Hysterectomy is incidentally noted.  Periarticular soft tissues demonstrate edema around the RIGHT hip tracking into the proximal RIGHT thigh.  There is no evidence of loosening of the femoral component. The acetabular component is almost completely obscured by susceptibility artifact.  Notably, when compared to the prior CT of 12/04/2010, the fluid collection appears larger (image 91 series 2 of prior CT). The gluteal muscular atrophy was present and a smaller fluid collection was present on the prior CT.  IMPRESSION: 1. 6.2 x 3.7 x 8.0 cm posterior and lateral RIGHT hip fluid collection. In this patient with metal on metal arthroplasty, this may represent pseudotumor associated with arthroplasty complication. Hematoma or abscess are in the differential considerations. The amount of edema in the surrounding musculature and soft tissues suggests active inflammation. 2. Given the recent hip aspiration with dry tap, a CT-guided aspiration of the collection could be could considered for microbiology in this patient with an elevated white blood cell count. Septic arthritis of the hip arthroplasty remains in the differential considerations.   Electronically Signed   By: Dereck Ligas M.D.   On: 11/25/2014 12:10   Dg Fluoro Guide Ndl Plcd/bx/inj/loc  11/24/2014   CLINICAL DATA:  Right hip pain, status post right hip arthroplasty, hip aspiration of possible effusion  EXAM: RIGHT HIP ASPIRATION UNDER FLUOROSCOPY  FLUOROSCOPY TIME:  Fluoroscopy Time (in minutes and seconds): 1 minute 12 seconds  Number of Acquired Images:  2  PROCEDURE: Overlying skin prepped with Betadine, draped in the usual sterile fashion, and infiltrated locally with buffered Lidocaine.  20 gauge spinal needle advanced to the superolateral margin of the right femoral head prosthesis. 1 ml of Lidocaine injected easily. No fluid could be aspirated from the joint.   Repeat attempted at the level of the femoral neck prosthesis. No fluid could be aspirated from the joint.  Study was terminated after consultation with the referring physician.  No immediate complication.  IMPRESSION: Attempted right hip aspiration under fluoroscopy.  No fluid could be aspirated from the joint at the level of the femoral head or neck.   Electronically Signed   By: Julian Hy M.D.   On: 11/24/2014 13:48   Dg Hip Unilat With Pelvis 1v Right  11/23/2014   CLINICAL DATA:  Right hip pain with vomiting and fever.  EXAM: DG HIP (WITH OR WITHOUT PELVIS) 1V RIGHT  COMPARISON:  Earlier today as well as 03/27/2010.  FINDINGS: There are mild degenerative changes of the left hip. Right hip arthroplasty unchanged. Mild degenerative changes of the spine. Surgical sutures over the pelvis just right  of midline.  IMPRESSION: Right hip arthroplasty unchanged. No acute findings. Degenerative change left hip.   Electronically Signed   By: Marin Olp M.D.   On: 11/23/2014 21:43   Dg Hip Unilat With Pelvis 2-3 Views Right  11/23/2014   CLINICAL DATA:  Acute right hip pain.  EXAM: DG HIP (WITH OR WITHOUT PELVIS) 2-3V RIGHT  COMPARISON:  March 27, 2010.  FINDINGS: Status post right hip arthroplasty. No acute fracture or dislocation is noted. Left hip appears normal. Sacroiliac joints appear normal.  IMPRESSION: Status post right hip arthroplasty.  No acute abnormality seen.   Electronically Signed   By: Marijo Conception, M.D.   On: 11/23/2014 12:17   Ct Maxillofacial Wo Cm  11/23/2014   CLINICAL DATA:  Frontal headache for several days.  Febrile.  EXAM: CT MAXILLOFACIAL WITHOUT CONTRAST  TECHNIQUE: Multidetector CT imaging of the maxillofacial structures was performed. Multiplanar CT image reconstructions were also generated. A small metallic BB was placed on the right temple in order to reliably differentiate right from left.  COMPARISON:  None.  FINDINGS: The bones are intact. There is no fracture,  bone lesion or bony destruction. There is an air-fluid level in the left sphenoid sinus with no membrane thickening. This was also visible on the MRI of 08/24/2012, and is nonspecific. There is no significant membrane thickening in any of the paranasal sinuses. There is no retention cyst or polyp.  IMPRESSION: Minimal changes with a left sphenoid air-fluid level, but no inflammatory soft tissue thickening. Remainder of the paranasal sinuses are clear.   Electronically Signed   By: Andreas Newport M.D.   On: 11/23/2014 17:47    Oren Binet, MD  [facility] Pager:[phone]  If 7PM-7AM, please contact night-coverage [email or web address] Password [email or web address] 11/26/2014, 11:37 AM   LOS: 2 days

## 2014-11-27 ENCOUNTER — Inpatient Hospital Stay (HOSPITAL_COMMUNITY): Payer: Medicare Other

## 2014-11-27 DIAGNOSIS — M009 Pyogenic arthritis, unspecified: Secondary | ICD-10-CM | POA: Insufficient documentation

## 2014-11-27 DIAGNOSIS — G459 Transient cerebral ischemic attack, unspecified: Secondary | ICD-10-CM

## 2014-11-27 DIAGNOSIS — T8459XS Infection and inflammatory reaction due to other internal joint prosthesis, sequela: Secondary | ICD-10-CM

## 2014-11-27 LAB — BASIC METABOLIC PANEL
Anion gap: 9 (ref 5–15)
BUN: 8 mg/dL (ref 6–20)
CALCIUM: 8.8 mg/dL — AB (ref 8.9–10.3)
CHLORIDE: 90 mmol/L — AB (ref 101–111)
CO2: 27 mmol/L (ref 22–32)
Creatinine, Ser: 0.89 mg/dL (ref 0.44–1.00)
GFR calc Af Amer: >60 mL/min (ref >60)
Glucose, Bld: 139 mg/dL — ABNORMAL HIGH (ref 65–99)
Potassium: 2.9 mmol/L — ABNORMAL LOW (ref 3.5–5.1)
SODIUM: 126 mmol/L — AB (ref 135–145)

## 2014-11-27 LAB — CBC
HCT: 24.7 % — ABNORMAL LOW (ref 36.0–46.0)
HEMOGLOBIN: 8.4 g/dL — AB (ref 12.0–15.0)
MCH: 27.4 pg (ref 26.0–34.0)
MCHC: 34 g/dL (ref 30.0–36.0)
MCV: 80.5 fL (ref 78.0–100.0)
Platelets: 158 10*3/uL (ref 150–400)
RBC: 3.07 MIL/uL — ABNORMAL LOW (ref 3.87–5.11)
RDW: 14.9 % (ref 11.5–15.5)
WBC: 7.3 10*3/uL (ref 4.0–10.5)

## 2014-11-27 LAB — HIV ANTIBODY (ROUTINE TESTING W REFLEX): HIV SCREEN 4TH GENERATION: NONREACTIVE

## 2014-11-27 LAB — SEDIMENTATION RATE: Sed Rate: 135 mm/hr — ABNORMAL HIGH (ref 0–22)

## 2014-11-27 LAB — C-REACTIVE PROTEIN: CRP: 37.2 mg/dL — ABNORMAL HIGH (ref <1.0)

## 2014-11-27 MED ORDER — ASPIRIN EC 325 MG PO TBEC
325.0000 mg | DELAYED_RELEASE_TABLET | Freq: Two times a day (BID) | ORAL | Status: DC
  Administered 2014-11-27 – 2014-12-04 (×14): 325 mg via ORAL
  Filled 2014-11-27 (×14): qty 1

## 2014-11-27 MED ORDER — POTASSIUM CHLORIDE CRYS ER 20 MEQ PO TBCR
20.0000 meq | EXTENDED_RELEASE_TABLET | Freq: Every day | ORAL | Status: DC
  Administered 2014-11-28 – 2014-11-29 (×2): 20 meq via ORAL
  Filled 2014-11-27 (×2): qty 1

## 2014-11-27 MED ORDER — CEFAZOLIN SODIUM-DEXTROSE 2-3 GM-% IV SOLR
2.0000 g | Freq: Three times a day (TID) | INTRAVENOUS | Status: DC
  Administered 2014-11-27 – 2014-12-04 (×20): 2 g via INTRAVENOUS
  Filled 2014-11-27 (×24): qty 50

## 2014-11-27 MED ORDER — POTASSIUM CHLORIDE CRYS ER 20 MEQ PO TBCR
40.0000 meq | EXTENDED_RELEASE_TABLET | Freq: Two times a day (BID) | ORAL | Status: AC
Start: 2014-11-27 — End: 2014-11-27
  Administered 2014-11-27 (×2): 40 meq via ORAL
  Filled 2014-11-27 (×2): qty 2

## 2014-11-27 NOTE — Progress Notes (Signed)
  Echocardiogram 2D Echocardiogram has been performed.  Darlina Sicilian M 11/27/2014, 11:58 AM

## 2014-11-27 NOTE — Progress Notes (Signed)
[facility]    Subjective: Pt with worsening erythema and tenderness around her incision site. She is still having some loose bm but not more than 4 per day    Antibiotics:  Anti-infectives    Start     Dose/Rate Route Frequency Ordered Stop   11/27/14 1000  rifampin (RIFADIN) capsule 300 mg     300 mg Oral Every 12 hours 11/26/14 2046     11/26/14 1200  vancomycin (VANCOCIN) 1,250 mg in sodium chloride 0.9 % 250 mL IVPB     1,250 mg 166.7 mL/hr over 90 Minutes Intravenous Every 12 hours 11/26/14 0017     11/26/14 0030  vancomycin (VANCOCIN) 1,250 mg in sodium chloride 0.9 % 250 mL IVPB     1,250 mg 166.7 mL/hr over 90 Minutes Intravenous NOW 11/26/14 0017 11/26/14 0315   11/25/14 1756  vancomycin (VANCOCIN) powder  Status:  Discontinued       As needed 11/25/14 1756 11/25/14 1814   11/25/14 1754  gentamicin (GARAMYCIN) injection  Status:  Discontinued       As needed 11/25/14 1755 11/25/14 1814   11/25/14 1700  vancomycin (VANCOCIN) IVPB 1000 mg/200 mL premix  Status:  Discontinued     1,000 mg 200 mL/hr over 60 Minutes Intravenous Every 12 hours 11/25/14 0916 11/25/14 1113   11/25/14 1500  vancomycin (VANCOCIN) 50 mg/mL oral solution 125 mg     125 mg Oral 4 times daily 11/25/14 1415 12/09/14 1359   11/24/14 1200  piperacillin-tazobactam (ZOSYN) IVPB 3.375 g  Status:  Discontinued     3.375 g 12.5 mL/hr over 240 Minutes Intravenous 3 times per day 11/24/14 0210 11/25/14 1113   11/24/14 0700  vancomycin (VANCOCIN) IVPB 750 mg/150 ml premix  Status:  Discontinued     750 mg 150 mL/hr over 60 Minutes Intravenous Every 12 hours 11/24/14 0210 11/25/14 0916   11/24/14 0300  piperacillin-tazobactam (ZOSYN) IVPB 3.375 g     3.375 g 100 mL/hr over 30 Minutes Intravenous  Once 11/24/14 0210 11/24/14 0858      Medications: Scheduled Meds: . acidophilus  1 capsule Oral Daily  . aspirin EC  325 mg Oral BID PC  . atenolol  50 mg Oral BID  .  carbamazepine  300 mg Oral BID  . escitalopram  20 mg Oral Daily  . furosemide  20 mg Oral Daily  . mirtazapine  22.5 mg Oral QHS  . ondansetron (ZOFRAN) IV  4 mg Intravenous TID AC  . pantoprazole  40 mg Oral Daily  . [START ON 11/28/2014] potassium chloride  20 mEq Oral Daily  . potassium chloride  40 mEq Oral BID  . rifampin  300 mg Oral Q12H  . traZODone  50 mg Oral QHS  . vancomycin  1,250 mg Intravenous Q12H  . vancomycin  125 mg Oral QID   Continuous Infusions:  PRN Meds:.ALPRAZolam, diphenhydrAMINE, HYDROmorphone (DILAUDID) injection, HYDROmorphone (DILAUDID) injection, methocarbamol, oxyCODONE, promethazine, sodium chloride    Objective: Weight change:   Intake/Output Summary (Last 24 hours) at 11/27/14 2059 Last data filed at 11/27/14 1700  Gross per 24 hour  Intake   1330 ml  Output      0 ml  Net   1330 ml   Blood pressure 115/62, pulse 84, temperature 98.6 F (37 C), temperature source Oral, resp. rate 18, weight 200 lb (90.719 kg), SpO2 100 %. Temp:  [98.6 F (37 C)-99.8 F (37.7 C)]  98.6 F (37 C) (08/02 1400) Pulse Rate:  [84-94] 84 (08/02 1400) Resp:  [16-18] 18 (08/02 1400) BP: (115-138)/(62-67) 115/62 mmHg (08/02 1400) SpO2:  [95 %-100 %] 100 % (08/02 1400)  Physical Exam: General: Alert and awake, oriented x3, . HEENT: anicteric sclera,EOMI CVS regular rate, normal r,  no murmur rubs or gallops  Skin: erythema around incision site with tenderness distally  Neuro: nonfocal  CBC:  CBC Latest Ref Rng 11/27/2014 11/26/2014 11/25/2014  WBC 4.0 - 10.5 K/uL 7.3 7.4 13.1(H)  Hemoglobin 12.0 - 15.0 g/dL 8.4(L) 8.2(L) 10.2(L)  Hematocrit 36.0 - 46.0 % 24.7(L) 23.5(L) 30.0(L)  Platelets 150 - 400 K/uL 158 120(L) 126(L)       BMET  Recent Labs  11/26/14 0444 11/27/14 0505  NA 125* 126*  K 3.3* 2.9*  CL 95* 90*  CO2 24 27  GLUCOSE 148* 139*  BUN 8 8  CREATININE 0.86 0.89  CALCIUM 8.2* 8.8*     Liver Panel  No results for input(s): PROT,  ALBUMIN, AST, ALT, ALKPHOS, BILITOT, BILIDIR, IBILI in the last 72 hours.     Sedimentation Rate  Recent Labs  11/27/14 0505  ESRSEDRATE 135*   C-Reactive Protein  Recent Labs  11/27/14 0505  CRP 37.2*    Micro Results: Recent Results (from the past 720 hour(s))  Blood culture (routine x 2)     Status: None (Preliminary result)   Collection Time: 11/23/14  1:32 PM  Result Value Ref Range Status   Specimen Description BLOOD RIGHT HAND  Final   Special Requests BOTTLES DRAWN AEROBIC AND ANAEROBIC  1CC  Final   Culture NO GROWTH 4 DAYS  Final   Report Status PENDING  Incomplete  Blood culture (routine x 2)     Status: None (Preliminary result)   Collection Time: 11/23/14  2:14 PM  Result Value Ref Range Status   Specimen Description BLOOD LEFT ASSIST CONTROL  Final   Special Requests   Final    BOTTLES DRAWN AEROBIC AND ANAEROBIC  AER [place] ANA [place]   Culture NO GROWTH 4 DAYS  Final   Report Status PENDING  Incomplete  Culture, blood (x 2)     Status: None (Preliminary result)   Collection Time: 11/24/14  2:55 PM  Result Value Ref Range Status   Specimen Description BLOOD LEFT ARM  Final   Special Requests IN PEDIATRIC BOTTLE 3CC  Final   Culture NO GROWTH 3 DAYS  Final   Report Status PENDING  Incomplete  Culture, blood (x 2)     Status: None (Preliminary result)   Collection Time: 11/24/14  3:13 PM  Result Value Ref Range Status   Specimen Description BLOOD RIGHT ANTECUBITAL  Final   Special Requests BOTTLES DRAWN AEROBIC ONLY 5CC  Final   Culture NO GROWTH 3 DAYS  Final   Report Status PENDING  Incomplete  Urine culture     Status: None   Collection Time: 11/24/14  4:00 PM  Result Value Ref Range Status   Specimen Description URINE, RANDOM  Final   Special Requests ADDED 2213  Final   Culture NO GROWTH 2 DAYS  Final   Report Status 11/26/2014 FINAL  Final  Stool culture     Status: None (Preliminary result)   Collection Time: 11/24/14 10:16 PM  Result Value  Ref Range Status   Specimen Description STOOL  Final   Special Requests NONE  Final   Culture   Final    NO SUSPICIOUS COLONIES,  CONTINUING TO HOLD Performed at [facility]    Report Status PENDING  Incomplete  Clostridium Difficile by PCR (not at [facility])     Status: Abnormal   Collection Time: 11/24/14 10:17 PM  Result Value Ref Range Status   Toxigenic C Difficile by pcr POSITIVE (A) NEGATIVE Final    Comment: CRITICAL RESULT CALLED TO, READ BACK BY AND VERIFIED WITH: Leonia Corona, RN AT 1401 11/25/14 BY A DAVIS   Surgical pcr screen     Status: None   Collection Time: 11/25/14  3:45 PM  Result Value Ref Range Status   MRSA, PCR NEGATIVE NEGATIVE Final   Staphylococcus aureus NEGATIVE NEGATIVE Final    Comment:        The Xpert SA Assay (FDA approved for NASAL specimens in patients over 65 years of age), is one component of a comprehensive surveillance program.  Test performance has been validated by [facility] for patients greater than or equal to 68 year old. It is not intended to diagnose infection nor to guide or monitor treatment.   Body fluid culture     Status: None (Preliminary result)   Collection Time: 11/25/14  4:51 PM  Result Value Ref Range Status   Specimen Description SYNOVIAL RIGHT HIP  Final   Special Requests SYNOVIAL RIGHT HIP  Final   Gram Stain   Final    ABUNDANT WBC PRESENT,BOTH PMN AND MONONUCLEAR FEW GRAM POSITIVE COCCI IN CLUSTERS IN CHAINS CRITICAL RESULT CALLED TO, READ BACK BY AND VERIFIED WITH: RIMANDO,D RN 11/25/14 2100 [place] CONFIRMED BY Lavenia Atlas    Culture ABUNDANT STAPHYLOCOCCUS AUREUS  Final   Report Status PENDING  Incomplete   Organism ID, Bacteria STAPHYLOCOCCUS AUREUS  Final      Susceptibility   Staphylococcus aureus - MIC*    CIPROFLOXACIN <=0.5 SENSITIVE Sensitive     ERYTHROMYCIN <=0.25 SENSITIVE Sensitive     GENTAMICIN <=0.5 SENSITIVE Sensitive     OXACILLIN 0.5 SENSITIVE Sensitive     TETRACYCLINE <=1  SENSITIVE Sensitive     VANCOMYCIN <=0.5 SENSITIVE Sensitive     TRIMETH/SULFA <=10 SENSITIVE Sensitive     CLINDAMYCIN <=0.25 SENSITIVE Sensitive     RIFAMPIN <=0.5 SENSITIVE Sensitive     Inducible Clindamycin NEGATIVE Sensitive     * ABUNDANT STAPHYLOCOCCUS AUREUS  Anaerobic culture     Status: None (Preliminary result)   Collection Time: 11/25/14  4:54 PM  Result Value Ref Range Status   Specimen Description ABSCESS RIGHT HIP  Final   Special Requests NONE  Final   Gram Stain   Final    FEW WBC PRESENT, PREDOMINANTLY PMN NO SQUAMOUS EPITHELIAL CELLS SEEN FEW GRAM POSITIVE COCCI IN PAIRS IN CLUSTERS Performed at [facility]    Culture   Final    NO ANAEROBES ISOLATED; CULTURE IN PROGRESS FOR 5 DAYS Performed at [facility]    Report Status PENDING  Incomplete  Culture, routine-abscess     Status: None (Preliminary result)   Collection Time: 11/25/14  4:54 PM  Result Value Ref Range Status   Specimen Description ABSCESS RIGHT HIP  Final   Special Requests NONE  Final   Gram Stain   Final    FEW WBC PRESENT,BOTH PMN AND MONONUCLEAR NO SQUAMOUS EPITHELIAL CELLS SEEN FEW GRAM POSITIVE COCCI IN PAIRS IN CLUSTERS Performed at [facility]    Culture   Final    ABUNDANT STAPHYLOCOCCUS AUREUS Note: RIFAMPIN AND GENTAMICIN SHOULD NOT BE USED AS SINGLE  DRUGS FOR TREATMENT OF STAPH INFECTIONS. Performed at [facility]    Report Status PENDING  Incomplete  Tissue culture     Status: None (Preliminary result)   Collection Time: 11/25/14  5:43 PM  Result Value Ref Range Status   Specimen Description TISSUE RIGHT HIP  Final   Special Requests NONE  Final   Gram Stain   Final    MODERATE WBC PRESENT,BOTH PMN AND MONONUCLEAR ABUNDANT GRAM POSITIVE COCCI IN PAIRS Gram Stain Report Called to,Read Back By and Verified With: Gram Stain Report Called to,Read Back By and Verified With: Y.Rimando RN on 11/26/14 at 02:58 by Rise Mu Performed at [facility]    Culture   Final    ABUNDANT STAPHYLOCOCCUS AUREUS Note: RIFAMPIN AND GENTAMICIN SHOULD NOT BE USED AS SINGLE DRUGS FOR TREATMENT OF STAPH INFECTIONS. Performed at [facility]    Report Status PENDING  Incomplete    Studies/Results: No results found.    Assessment/Plan:  Principal Problem:   Acute pain of right hip, right calf pain, ?sepsis Active Problems:   Right hip pain   Hyponatremia   BP (high blood pressure)   Prosthetic hip infection   Staphylococcus aureus infection   Enteritis due to Clostridium difficile   Screen for STD (sexually transmitted disease)    Christina French is a 59 y.o. 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 Ninfa Linden taking her back to the OR   #2 CDI: difficult to be sure given her presentation and fact that new stategy for testing not implemented yet  Will give her 14 day course  As above risk higher for relapse with ceph. Woud swap PPI for pepcid if on this med.  No role for probiotics.   LOS: 3 days   Alcide Evener 11/27/2014, 8:59 PM

## 2014-11-27 NOTE — Progress Notes (Signed)
Patient ID: Christina French, female   DOB: Jan 15, 1956, 59 y.o.   MRN: [id number] No acute changes.  Hip pain to be expected.  Right hip dressing clean and dry.  Reports one loose stool last evening.  Right thigh swollen.  Hgb stable.  WBC normal for 2nd day.  Will need to start PT for mobility (WBAT).  Case management consult for PICC line care and prolonged IV antibiotics.  Stop Lovenox and start twice daily aspirin given low H/H and thigh swelling.

## 2014-11-27 NOTE — Progress Notes (Signed)
[facility] FOLLOW UP CONSULT NOTE  Christina French HYI:[id number] DOB: [birth year] DOA: 11/23/2014 PCP: [name], MD  Subjective: Patient reports feeling significantly better than yesterday.  Assessment/Plan: Principal Problem: Sepsis - Secondary to infected right hip periprosthetic collection and C. difficile colitis. Much improved post irrigation and debridement of the right hip, cultures demonstrate MSSA-ID following-currently on IV Vanco/Rifampin. Suspect can be changed to Ancef.Defer to ID.   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   History of depression with anxiety - Continue with Xanax, Lexapro, Remeron and trazodone- Monitor sodium.  Hx of Malignant Melanoma in Situ-right leg - Followed at [facility]  Code Status: Full Family Communication: None at bedside Disposition Plan: Remain inpatient DVT Prophylaxis: Lovenox   Consultants:  Orthopedic  Infectious Disease  Procedures:  Irrigation and debridement of right hip 11/25/14  Antibiotics:  Vancomycin 11/24/14 >>  Zosyn 11/24/14 >>11/25/14  Rifampin 11/27/14 >>  Objective: Filed Vitals:   11/27/14 0712  BP: 138/67  Pulse: 94  Temp: 99.8 F (37.7 C)  Resp: 16    Intake/Output Summary (Last 24 hours) at 11/27/14 1145 Last data filed at 11/27/14 0700  Gross per 24 hour  Intake    850 ml  Output      0 ml  Net    850 ml   Filed Weights   11/24/14 0018  Weight: 90.719 kg (200 lb)    Exam:   General:  Patient is seated in recliner no acute distress, she is alert and oriented x 3 with clear speech   Cardiovascular: S1 S2 RRR, no m/r/g, 2+ DP pulses, 1+ LE edema bilaterally   Respiratory: Clear to auscultation, no w/r/r, normal  respiratory effort  Abdomen: Bowel sounds present, soft, non-distended, TTP LLQ, no guarding or peritoneal signs  Musculoskeletal: grossly normal tone, 5/5 strength BLE  Skin: Mild erythema surrounding incision site on right hip, no rashes  Data Reviewed: Basic Metabolic Panel:  Recent Labs Lab 11/24/14 0405 11/24/14 1455 11/25/14 0436 11/26/14 0444 11/27/14 0505  NA 124* 123* 125* 125* 126*  K 3.4* 3.5 3.6 3.3* 2.9*  CL 89* 87* 94* 95* 90*  CO2 25 24 20* 24 27  GLUCOSE 143* 167* 164* 148* 139*  BUN 12 10 8 8 8   CREATININE 1.14* 1.08* 0.86 0.86 0.89  CALCIUM 8.0* 8.4* 8.5* 8.2* 8.8*   Liver Function Tests:  Recent Labs Lab 11/23/14 1245 11/24/14 1455  AST 30 26  ALT 20 17  ALKPHOS 59 62  BILITOT 0.8 0.9  PROT 7.5 7.3  ALBUMIN 4.4 3.6   CBC:  Recent Labs Lab 11/23/14 1245 11/24/14 0405 11/24/14 1455 11/25/14 0436 11/26/14 0444 11/27/14 0505  WBC 22.0* 15.8* 21.1* 13.1* 7.4 7.3  NEUTROABS 19.2*  --  18.6*  --   --   --   HGB 11.6* 10.3* 11.5* 10.2* 8.2* 8.4*  HCT 34.2* 30.3* 34.0* 30.0* 23.5* 24.7*  MCV 81.5 81.5 83.1 81.7 81.3 80.5  PLT 160 123* 150 126* 120* 158    Recent Results (from the past 240 hour(s))  Blood culture (routine x 2)     Status: None (Preliminary result)   Collection Time: 11/23/14  1:32 PM  Result Value Ref Range Status   Specimen Description BLOOD RIGHT HAND  Final  Special Requests BOTTLES DRAWN AEROBIC AND ANAEROBIC  1CC  Final   Culture NO GROWTH 4 DAYS  Final   Report Status PENDING  Incomplete  Blood culture (routine x 2)     Status: None (Preliminary result)   Collection Time: 11/23/14  2:14 PM  Result Value Ref Range Status   Specimen Description BLOOD LEFT ASSIST CONTROL  Final   Special Requests   Final    BOTTLES DRAWN AEROBIC AND ANAEROBIC  AER [place] ANA [place]   Culture NO GROWTH 4 DAYS  Final   Report Status PENDING  Incomplete  Culture, blood (x 2)     Status: None (Preliminary result)   Collection Time:  11/24/14  2:55 PM  Result Value Ref Range Status   Specimen Description BLOOD LEFT ARM  Final   Special Requests IN PEDIATRIC BOTTLE 3CC  Final   Culture NO GROWTH 2 DAYS  Final   Report Status PENDING  Incomplete  Culture, blood (x 2)     Status: None (Preliminary result)   Collection Time: 11/24/14  3:13 PM  Result Value Ref Range Status   Specimen Description BLOOD RIGHT ANTECUBITAL  Final   Special Requests BOTTLES DRAWN AEROBIC ONLY 5CC  Final   Culture NO GROWTH 2 DAYS  Final   Report Status PENDING  Incomplete  Urine culture     Status: None   Collection Time: 11/24/14  4:00 PM  Result Value Ref Range Status   Specimen Description URINE, RANDOM  Final   Special Requests ADDED 2213  Final   Culture NO GROWTH 2 DAYS  Final   Report Status 11/26/2014 FINAL  Final  Stool culture     Status: None (Preliminary result)   Collection Time: 11/24/14 10:16 PM  Result Value Ref Range Status   Specimen Description STOOL  Final   Special Requests NONE  Final   Culture   Final    NO SUSPICIOUS COLONIES, CONTINUING TO HOLD Performed at [facility]    Report Status PENDING  Incomplete  Clostridium Difficile by PCR (not at [facility])     Status: Abnormal   Collection Time: 11/24/14 10:17 PM  Result Value Ref Range Status   Toxigenic C Difficile by pcr POSITIVE (A) NEGATIVE Final    Comment: CRITICAL RESULT CALLED TO, READ BACK BY AND VERIFIED WITH: Leonia Corona, RN AT 1401 11/25/14 BY A DAVIS   Surgical pcr screen     Status: None   Collection Time: 11/25/14  3:45 PM  Result Value Ref Range Status   MRSA, PCR NEGATIVE NEGATIVE Final   Staphylococcus aureus NEGATIVE NEGATIVE Final    Comment:        The Xpert SA Assay (FDA approved for NASAL specimens in patients over 28 years of age), is one component of a comprehensive surveillance program.  Test performance has been validated by [facility] for patients greater than or equal to 72 year old. It is not intended to diagnose  infection nor to guide or monitor treatment.   Body fluid culture     Status: None (Preliminary result)   Collection Time: 11/25/14  4:51 PM  Result Value Ref Range Status   Specimen Description SYNOVIAL RIGHT HIP  Final   Special Requests SYNOVIAL RIGHT HIP  Final   Gram Stain   Final    ABUNDANT WBC PRESENT,BOTH PMN AND MONONUCLEAR FEW GRAM POSITIVE COCCI IN CLUSTERS IN CHAINS CRITICAL RESULT CALLED TO, READ BACK BY AND VERIFIED WITH: RIMANDO,D RN 11/25/14 2100  [place]    Culture ABUNDANT STAPHYLOCOCCUS AUREUS  Final   Report Status PENDING  Incomplete   Organism ID, Bacteria STAPHYLOCOCCUS AUREUS  Final      Susceptibility   Staphylococcus aureus - MIC*    CIPROFLOXACIN <=0.5 SENSITIVE Sensitive     ERYTHROMYCIN <=0.25 SENSITIVE Sensitive     GENTAMICIN <=0.5 SENSITIVE Sensitive     OXACILLIN 0.5 SENSITIVE Sensitive     TETRACYCLINE <=1 SENSITIVE Sensitive     VANCOMYCIN <=0.5 SENSITIVE Sensitive     TRIMETH/SULFA <=10 SENSITIVE Sensitive     CLINDAMYCIN <=0.25 SENSITIVE Sensitive     RIFAMPIN <=0.5 SENSITIVE Sensitive     Inducible Clindamycin NEGATIVE Sensitive     * ABUNDANT STAPHYLOCOCCUS AUREUS  Anaerobic culture     Status: None (Preliminary result)   Collection Time: 11/25/14  4:54 PM  Result Value Ref Range Status   Specimen Description ABSCESS RIGHT HIP  Final   Special Requests NONE  Final   Gram Stain   Final    FEW WBC PRESENT, PREDOMINANTLY PMN NO SQUAMOUS EPITHELIAL CELLS SEEN FEW GRAM POSITIVE COCCI IN PAIRS IN CLUSTERS Performed at [facility]    Culture   Final    NO ANAEROBES ISOLATED; CULTURE IN PROGRESS FOR 5 DAYS Performed at [facility]    Report Status PENDING  Incomplete  Culture, routine-abscess     Status: None (Preliminary result)   Collection Time: 11/25/14  4:54 PM  Result Value Ref Range Status   Specimen Description ABSCESS RIGHT HIP  Final   Special Requests NONE  Final   Gram Stain    Final    FEW WBC PRESENT,BOTH PMN AND MONONUCLEAR NO SQUAMOUS EPITHELIAL CELLS SEEN FEW GRAM POSITIVE COCCI IN PAIRS IN CLUSTERS Performed at [facility]    Culture   Final    ABUNDANT STAPHYLOCOCCUS AUREUS Note: RIFAMPIN AND GENTAMICIN SHOULD NOT BE USED AS SINGLE DRUGS FOR TREATMENT OF STAPH INFECTIONS. Performed at [facility]    Report Status PENDING  Incomplete  Tissue culture     Status: None (Preliminary result)   Collection Time: 11/25/14  5:43 PM  Result Value Ref Range Status   Specimen Description TISSUE RIGHT HIP  Final   Special Requests NONE  Final   Gram Stain   Final    MODERATE WBC PRESENT,BOTH PMN AND MONONUCLEAR ABUNDANT GRAM POSITIVE COCCI IN PAIRS Gram Stain Report Called to,Read Back By and Verified With: Gram Stain Report Called to,Read Back By and Verified With: Y.Rimando RN on 11/26/14 at 02:58 by Rise Mu Performed at [facility]    Culture   Final    ABUNDANT STAPHYLOCOCCUS AUREUS Note: RIFAMPIN AND GENTAMICIN SHOULD NOT BE USED AS SINGLE DRUGS FOR TREATMENT OF STAPH INFECTIONS. Performed at [facility]    Report Status PENDING  Incomplete     Studies: No results found.  Scheduled Meds: . acidophilus  1 capsule Oral Daily  . aspirin EC  325 mg Oral BID PC  . atenolol  50 mg Oral BID  . carbamazepine  300 mg Oral BID  . escitalopram  20 mg Oral Daily  . furosemide  20 mg Oral Daily  . mirtazapine  22.5 mg Oral QHS  . ondansetron (ZOFRAN) IV  4 mg Intravenous TID AC  . pantoprazole  40 mg Oral Daily  . [START ON 11/28/2014] potassium chloride  20 mEq Oral Daily  . potassium chloride  40 mEq Oral BID  .  rifampin  300 mg Oral Q12H  . traZODone  50 mg Oral QHS  . vancomycin  1,250 mg Intravenous Q12H  . vancomycin  125 mg Oral QID    Principal Problem:   Acute pain of right hip, right calf pain, ?sepsis Active Problems:   Right hip pain   Hyponatremia   BP (high blood pressure)   Prosthetic hip  infection   Staphylococcus aureus infection   Enteritis due to Clostridium difficile   Screen for STD (sexually transmitted disease)    Time spent: [place], PA-S Carmell Austria, MD  [facility]  If 7PM-7AM, please contact night-coverage at [email or web address], password [email or web address] 11/27/2014, 11:45 AM  LOS: 3 days    Attending MD note  Patient was seen, examined,treatment plan was discussed with thePA-S.  I have personally reviewed the clinical findings, lab, imaging studies and management of this patient in detail. I agree with the documentation as above  Sepsis physiology better-diarrhea slowly improving. Continue IV Vanco for periprosthetic hip infection and oral vanco for C Diff. May need SNF, PT eval pending.    [facility]

## 2014-11-27 NOTE — Care Management Note (Signed)
Case Management Note  Patient Details  Name: Christina French MRN: [id number] Date of Birth: Apr 16, 1956  Subjective/Objective:           Right hip infection         Action/Plan: Spoke with patient and her daughter Christina French about home IV antibiotics and home PT. They selected [facility]. Patient and Christina French willing to learn how to give antibiotics, PICC in place in right arm. Patient stated that she has a rolling walker but would like a 3N1. Contacted Miranda at [facility] and set up IV antibiotics, HHRN, and HHPT. Will contact [facility] for delivery of 3N1 to patient's room. Will continue to follow for d/c needs.        Expected Discharge Date:                  Expected Discharge Plan:  [place]  In-House Referral:  NA  Discharge planning Services  CM Consult  Post Acute Care Choice:  Durable Medical Equipment, Home Health Choice offered to:  Patient  DME Arranged:  3-N-1, IV pump/equipment DME Agency:  [place] Arranged:  RN, PT, IV Antibiotics HH Agency:  [place]  Status of Service:  In process, will continue to follow  Medicare Important Message Given:    Date Medicare IM Given:    Medicare IM give by:    Date Additional Medicare IM Given:    Additional Medicare Important Message give by:     If discussed at [place] of Stay Meetings, dates discussed:    Additional Comments:  Nila Nephew, RN 11/27/2014, 4:31 PM

## 2014-11-27 NOTE — Progress Notes (Signed)
[facility]  Patient Status:   New pt for [facility] this admission  [facility] is providing the following services: HHRN , PT and home infusion pharmacy team for home IV ABX. [facility] hospital infusion coordinator will provide in hospital teaching  to support independence at home with IV ABX.  [facility] hospital team will follow pt and support transition home when ordered.    If patient discharges after hours, please call [phone].   Larry Sierras 11/27/2014, 11:56 PM

## 2014-11-27 NOTE — Progress Notes (Signed)
Patient ID: Christina French, female   DOB: 01-08-1956, 59 y.o.   MRN: [id number] She still is experiencing right hip pain and there is some redness around her incision.  I spoke with her and her daughter as well as Dr. Tommy Medal and we all agree that a repeat I&D is required.  Will proceed to the OR late tomorrow 8/3.

## 2014-11-27 NOTE — Progress Notes (Signed)
ANTIBIOTIC CONSULT NOTE - FOLLOW UP  Pharmacy Consult:  Vancomycin  Indication: GPC in R hip synovial fluid  No Known Allergies  Patient Measurements: Weight: 200 lb (90.719 kg)  Ht: 61 inches  Vital Signs: Temp: 98.6 F (37 C) (08/02 1400) BP: 115/62 mmHg (08/02 1400) Pulse Rate: 84 (08/02 1400) Intake/Output from previous day: 08/01 0701 - 08/02 0700 In: 850 [P.O.:600; IV Piggyback:250] Out: -   Labs:  Recent Labs  11/25/14 0436 11/26/14 0444 11/27/14 0505  WBC 13.1* 7.4 7.3  HGB 10.2* 8.2* 8.4*  PLT 126* 120* 158  CREATININE 0.86 0.86 0.89   Estimated Creatinine Clearance: 71.6 mL/min (by C-G formula based on Cr of 0.89). No results for input(s): VANCOTROUGH, VANCOPEAK, VANCORANDOM, GENTTROUGH, GENTPEAK, GENTRANDOM, TOBRATROUGH, TOBRAPEAK, TOBRARND, AMIKACINPEAK, AMIKACINTROU, AMIKACIN in the last 72 hours.    Assessment: 69 YOF started on vancomycin and Zosyn for sepsis upon admission. Pt also on po vanc for cdiff. S/p OR for I&D of R hip periprosthetic joint - vanc and gent abx beads placed. Synovial fluid now growing MSSA.  Scr 0.86, est Crcl 74 ml/min.  Ancef 8/2 >> Vanc 7/30 >> 8/2 Zosyn 7/30 >>7/31  7/30 BCx x2 - ngtd 7/30 UCx - ngtd 7/31 C.diff - positive 7/31 R hip synovial fluid - MSSA  Goal of Therapy:  Vancomycin trough level 15-20 mcg/ml  Plan:  - Ancef 2g/8h - Watch cultures and renal fx - Anticipate 2 week duration     Christina French, PharmD, BCPS Clinical Pharmacist Pager: [phone] 11/27/2014 9:26 PM

## 2014-11-27 NOTE — Evaluation (Signed)
Physical Therapy Evaluation Patient Details Name: Christina French MRN: [id number] DOB: [birth year] Today's Date: 11/27/2014   History of Present Illness  59 yo female with pain R hip now having received surgical wash of R hip and will later have revision due to metal on metal degrading  Clinical Impression  Pt is demonstrating good initial mobility and plans to go home from hospital.  Her family is available at times and she mostly is expectign to care for herself.  Her energy and mobility are good, and should be comfortably able to make the transition.    Follow Up Recommendations Home health PT;Supervision/Assistance - 24 hour    Equipment Recommendations  None recommended by PT    Recommendations for Other Services       Precautions / Restrictions Precautions Precautions: Fall;Other (comment) (Pt has no recall of previous hip precautions) Restrictions Weight Bearing Restrictions: Yes Other Position/Activity Restrictions: WBAT      Mobility  Bed Mobility Overal bed mobility: Needs Assistance Bed Mobility: Supine to Sit     Supine to sit: Min assist        Transfers Overall transfer level: Needs assistance   Transfers: Sit to/from Stand;Stand Pivot Transfers Sit to Stand: Min assist Stand pivot transfers: Min assist          Ambulation/Gait Ambulation/Gait assistance: Min guard;Min assist Ambulation Distance (Feet): 40 Feet Assistive device: Rolling walker (2 wheeled);1 person hand held assist Gait Pattern/deviations: Step-through pattern;Shuffle;Wide base of support;Antalgic Gait velocity: reduced Gait velocity interpretation: Below normal speed for age/gender General Gait Details: reduction of WB ing on BLE's with gait from discomfort but is touching heel first   Stairs            Wheelchair Mobility    Modified Rankin (Stroke Patients Only)       Balance Overall balance assessment: Needs assistance Sitting-balance support: Feet  supported Sitting balance-Leahy Scale: Good   Postural control: Posterior lean Standing balance support: Bilateral upper extremity supported Standing balance-Leahy Scale: Fair                               Pertinent Vitals/Pain Pain Assessment: Faces Pain Score: 2  Faces Pain Scale: Hurts a little bit Pain Location: R hip Pain Intervention(s): Limited activity within patient's tolerance;Premedicated before session    [place] expects to be discharged to:: Private residence Living Arrangements: Alone Available Help at Discharge: Family;Available PRN/intermittently Type of Home: House Home Access: Ramped entrance     Home Layout: One level Home Equipment: Walker - 2 wheels;Walker - 4 wheels;Cane - quad;Cane - single point      Prior Function Level of Independence: Independent         Comments: used no AD prior to surgery     Hand Dominance        Extremity/Trunk Assessment   Upper Extremity Assessment: Overall WFL for tasks assessed           Lower Extremity Assessment: Generalized weakness      Cervical / Trunk Assessment: Kyphotic  Communication   Communication: No difficulties  Cognition Arousal/Alertness: Awake/alert Behavior During Therapy: WFL for tasks assessed/performed Overall Cognitive Status: Within Functional Limits for tasks assessed                      General Comments General comments (skin integrity, edema, etc.): Pt is mainly struggling with her enteric symptoms and otherwise is safely able  to be assisted on walker.  HHPT should see pt to help her go home safely    Exercises        Assessment/Plan    PT Assessment Patient needs continued PT services  PT Diagnosis Difficulty walking;Abnormality of gait;Generalized weakness;Acute pain   PT Problem List Decreased strength;Decreased range of motion;Decreased activity tolerance;Decreased balance;Decreased mobility;Decreased  coordination;Decreased knowledge of use of DME;Decreased safety awareness;Decreased knowledge of precautions;Pain;Decreased skin integrity  PT Treatment Interventions DME instruction;Gait training;Stair training;Functional mobility training;Therapeutic activities;Therapeutic exercise;Balance training;Neuromuscular re-education;Patient/family education   PT Goals (Current goals can be found in the Care Plan section) Acute Rehab PT Goals Patient Stated Goal: to get home safely alone PT Goal Formulation: With patient Time For Goal Achievement: 12/11/14 Potential to Achieve Goals: Good    Frequency 7X/week   Barriers to discharge   Has to go home alone and will need to walk with mod I on hallway    Co-evaluation               End of Session Equipment Utilized During Treatment: Gait belt Activity Tolerance: Patient tolerated treatment well Patient left: in chair;with call bell/phone within reach Nurse Communication: Mobility status         Time: 3754-3606 PT Time Calculation (min) (ACUTE ONLY): 29 min   Charges:   PT Evaluation $Initial PT Evaluation Tier I: 1 Procedure PT Treatments $Gait Training: 8-22 mins   PT G Codes:        Ramond Dial Dec 23, 2014, 1:32 PM   Mee Hives, PT MS Acute Rehab Dept. Number: [facility] [phone] and [place] [phone]

## 2014-11-28 ENCOUNTER — Inpatient Hospital Stay (HOSPITAL_COMMUNITY): Payer: Medicare Other | Admitting: Anesthesiology

## 2014-11-28 ENCOUNTER — Encounter (HOSPITAL_COMMUNITY)
Admission: EM | Disposition: A | Discharge status: Skilled Nursing Facility | Payer: Self-pay | Source: Home / Self Care | Attending: Orthopaedic Surgery

## 2014-11-28 DIAGNOSIS — M25551 Pain in right hip: Secondary | ICD-10-CM

## 2014-11-28 HISTORY — PX: INCISION AND DRAINAGE HIP: SHX1801

## 2014-11-28 LAB — CULTURE, BLOOD (ROUTINE X 2)
CULTURE: NO GROWTH
Culture: NO GROWTH

## 2014-11-28 LAB — CULTURE, ROUTINE-ABSCESS

## 2014-11-28 LAB — CBC
HCT: 25.6 % — ABNORMAL LOW (ref 36.0–46.0)
Hemoglobin: 8.7 g/dL — ABNORMAL LOW (ref 12.0–15.0)
MCH: 27.8 pg (ref 26.0–34.0)
MCHC: 34 g/dL (ref 30.0–36.0)
MCV: 81.8 fL (ref 78.0–100.0)
PLATELETS: 175 10*3/uL (ref 150–400)
RBC: 3.13 MIL/uL — ABNORMAL LOW (ref 3.87–5.11)
RDW: 15 % (ref 11.5–15.5)
WBC: 5.7 10*3/uL (ref 4.0–10.5)

## 2014-11-28 LAB — TISSUE CULTURE

## 2014-11-28 LAB — HEPATITIS C ANTIBODY (REFLEX): HCV Ab: <0.1 s/co ratio (ref 0.0–0.9)

## 2014-11-28 LAB — HCV COMMENT:

## 2014-11-28 SURGERY — IRRIGATION AND DEBRIDEMENT HIP
Anesthesia: General | Laterality: Right

## 2014-11-28 MED ORDER — 0.9 % SODIUM CHLORIDE (POUR BTL) OPTIME
TOPICAL | Status: DC | PRN
  Administered 2014-11-28: 1000 mL

## 2014-11-28 MED ORDER — FAMOTIDINE 20 MG PO TABS: 40.0000 mg | ORAL_TABLET | Freq: Every day | ORAL | Status: DC | PRN

## 2014-11-28 MED ORDER — PROPOFOL 10 MG/ML IV BOLUS
INTRAVENOUS | Status: AC
  Filled 2014-11-28: qty 20

## 2014-11-28 MED ORDER — ONDANSETRON HCL 4 MG/2ML IJ SOLN
INTRAMUSCULAR | Status: AC
  Administered 2014-11-28: 20:00:00
  Filled 2014-11-28: qty 2

## 2014-11-28 MED ORDER — SODIUM CHLORIDE 0.9 % IR SOLN
Status: DC | PRN
  Administered 2014-11-28 (×3): 3000 mL

## 2014-11-28 MED ORDER — MIDAZOLAM HCL 5 MG/5ML IJ SOLN
INTRAMUSCULAR | Status: DC | PRN
  Administered 2014-11-28: 2 mg via INTRAVENOUS

## 2014-11-28 MED ORDER — ACETAMINOPHEN 325 MG PO TABS
650.0000 mg | ORAL_TABLET | Freq: Four times a day (QID) | ORAL | Status: DC | PRN
  Administered 2014-11-28 – 2014-11-30 (×4): 650 mg via ORAL
  Filled 2014-11-28 (×4): qty 2

## 2014-11-28 MED ORDER — MIDAZOLAM HCL 2 MG/2ML IJ SOLN
INTRAMUSCULAR | Status: AC
  Filled 2014-11-28: qty 4

## 2014-11-28 MED ORDER — PROPOFOL 10 MG/ML IV BOLUS
INTRAVENOUS | Status: DC | PRN
  Administered 2014-11-28: 50 mg via INTRAVENOUS
  Administered 2014-11-28: 150 mg via INTRAVENOUS

## 2014-11-28 MED ORDER — SODIUM CHLORIDE 0.9 % IV SOLN
INTRAVENOUS | Status: DC
  Administered 2014-11-28 (×2): via INTRAVENOUS

## 2014-11-28 MED ORDER — FENTANYL CITRATE (PF) 100 MCG/2ML IJ SOLN
25.0000 ug | INTRAMUSCULAR | Status: DC | PRN
  Administered 2014-11-28: 50 ug via INTRAVENOUS

## 2014-11-28 MED ORDER — PHENYLEPHRINE HCL 10 MG/ML IJ SOLN
INTRAMUSCULAR | Status: DC | PRN
  Administered 2014-11-28 (×4): 80 ug via INTRAVENOUS
  Administered 2014-11-28 (×2): 120 ug via INTRAVENOUS

## 2014-11-28 MED ORDER — FENTANYL CITRATE (PF) 250 MCG/5ML IJ SOLN
INTRAMUSCULAR | Status: AC
  Filled 2014-11-28: qty 5

## 2014-11-28 MED ORDER — LACTATED RINGERS IV SOLN
INTRAVENOUS | Status: DC
  Administered 2014-11-28 – 2014-12-04 (×4): via INTRAVENOUS

## 2014-11-28 MED ORDER — ONDANSETRON HCL 4 MG/2ML IJ SOLN
4.0000 mg | Freq: Once | INTRAMUSCULAR | Status: AC | PRN
  Administered 2014-11-28: 4 mg via INTRAVENOUS

## 2014-11-28 MED ORDER — HYDROCODONE-ACETAMINOPHEN 7.5-325 MG PO TABS: 1.0000 | ORAL_TABLET | Freq: Once | ORAL | Status: DC | PRN

## 2014-11-28 MED ORDER — FENTANYL CITRATE (PF) 100 MCG/2ML IJ SOLN
INTRAMUSCULAR | Status: DC | PRN
  Administered 2014-11-28: 100 ug via INTRAVENOUS
  Administered 2014-11-28 (×2): 50 ug via INTRAVENOUS
  Administered 2014-11-28 (×2): 25 ug via INTRAVENOUS

## 2014-11-28 MED ORDER — FENTANYL CITRATE (PF) 100 MCG/2ML IJ SOLN
INTRAMUSCULAR | Status: AC
  Filled 2014-11-28: qty 2

## 2014-11-28 MED ORDER — SUCCINYLCHOLINE CHLORIDE 20 MG/ML IJ SOLN
INTRAMUSCULAR | Status: DC | PRN
  Administered 2014-11-28: 80 mg via INTRAVENOUS

## 2014-11-28 SURGICAL SUPPLY — 53 items
BAG DECANTER FOR FLEXI CONT (MISCELLANEOUS) IMPLANT
COVER SURGICAL LIGHT HANDLE (MISCELLANEOUS) ×3 IMPLANT
DRAPE IMP U-DRAPE 54X76 (DRAPES) ×3 IMPLANT
DRAPE ORTHO SPLIT 77X108 STRL (DRAPES) ×6
DRAPE SURG ORHT 6 SPLT 77X108 (DRAPES) ×2 IMPLANT
DRAPE U-SHAPE 47X51 STRL (DRAPES) ×3 IMPLANT
DRSG ADAPTIC 3X8 NADH LF (GAUZE/BANDAGES/DRESSINGS) ×3 IMPLANT
DRSG MEPILEX BORDER 4X12 (GAUZE/BANDAGES/DRESSINGS) ×4 IMPLANT
DRSG PAD ABDOMINAL 8X10 ST (GAUZE/BANDAGES/DRESSINGS) ×3 IMPLANT
DURAPREP 26ML APPLICATOR (WOUND CARE) ×3 IMPLANT
ELECT CAUTERY BLADE 6.4 (BLADE) IMPLANT
ELECT REM PT RETURN 9FT ADLT (ELECTROSURGICAL)
ELECTRODE REM PT RTRN 9FT ADLT (ELECTROSURGICAL) IMPLANT
GAUZE SPONGE 4X4 12PLY STRL (GAUZE/BANDAGES/DRESSINGS) ×3 IMPLANT
GAUZE XEROFORM 5X9 LF (GAUZE/BANDAGES/DRESSINGS) ×2 IMPLANT
GLOVE BIO SURGEON STRL SZ8 (GLOVE) ×3 IMPLANT
GLOVE BIOGEL PI IND STRL 8 (GLOVE) ×1 IMPLANT
GLOVE BIOGEL PI INDICATOR 8 (GLOVE) ×2
GLOVE BIOGEL PI ORTHO PRO SZ7 (GLOVE) ×2
GLOVE ORTHO TXT STRL SZ7.5 (GLOVE) ×5 IMPLANT
GLOVE PI ORTHO PRO STRL SZ7 (GLOVE) IMPLANT
GLOVE SURG SS PI 6.5 STRL IVOR (GLOVE) ×2 IMPLANT
GOWN STRL REUS W/ TWL LRG LVL3 (GOWN DISPOSABLE) ×1 IMPLANT
GOWN STRL REUS W/ TWL XL LVL3 (GOWN DISPOSABLE) ×4 IMPLANT
GOWN STRL REUS W/TWL LRG LVL3 (GOWN DISPOSABLE) ×3
GOWN STRL REUS W/TWL XL LVL3 (GOWN DISPOSABLE) ×12
HANDPIECE INTERPULSE COAX TIP (DISPOSABLE)
KIT BASIN OR (CUSTOM PROCEDURE TRAY) ×3 IMPLANT
KIT ROOM TURNOVER OR (KITS) ×3 IMPLANT
MANIFOLD NEPTUNE II (INSTRUMENTS) ×3 IMPLANT
NS IRRIG 1000ML POUR BTL (IV SOLUTION) ×3 IMPLANT
PACK TOTAL JOINT (CUSTOM PROCEDURE TRAY) ×3 IMPLANT
PACK UNIVERSAL I (CUSTOM PROCEDURE TRAY) ×3 IMPLANT
PAD ARMBOARD 7.5X6 YLW CONV (MISCELLANEOUS) ×6 IMPLANT
SET HNDPC FAN SPRY TIP SCT (DISPOSABLE) IMPLANT
SPONGE LAP 18X18 X RAY DECT (DISPOSABLE) ×3 IMPLANT
STAPLER VISISTAT 35W (STAPLE) ×5 IMPLANT
SUCTION HEMOVAC SNY HYST (SUCTIONS) ×2 IMPLANT
SUT ETHIBOND NAB CT1 #1 30IN (SUTURE) ×6 IMPLANT
SUT ETHILON 2 0 FS 18 (SUTURE) ×3 IMPLANT
SUT VIC AB 0 CT1 27 (SUTURE) ×6
SUT VIC AB 0 CT1 27XBRD ANBCTR (SUTURE) IMPLANT
SUT VIC AB 1 CTB1 27 (SUTURE) ×8 IMPLANT
SUT VIC AB 2-0 CT1 27 (SUTURE) ×15
SUT VIC AB 2-0 CT1 TAPERPNT 27 (SUTURE) ×2 IMPLANT
SUT VICRYL 0 CT 1 36IN (SUTURE) ×3 IMPLANT
TIP HIGH FLOW IRRIGATION COAX (MISCELLANEOUS) ×2 IMPLANT
TOWEL OR 17X24 6PK STRL BLUE (TOWEL DISPOSABLE) ×3 IMPLANT
TOWEL OR 17X26 10 PK STRL BLUE (TOWEL DISPOSABLE) ×3 IMPLANT
TUBE ANAEROBIC SPECIMEN COL (MISCELLANEOUS) IMPLANT
UNDERPAD 30X30 INCONTINENT (UNDERPADS AND DIAPERS) ×3 IMPLANT
WATER STERILE IRR 1000ML POUR (IV SOLUTION) ×3 IMPLANT
YANKAUER SUCT BULB TIP NO VENT (SUCTIONS) ×2 IMPLANT

## 2014-11-28 NOTE — Transfer of Care (Signed)
Immediate Anesthesia Transfer of Care Note  Patient: Christina French  Procedure(s) Performed: Procedure(s): Repeat I&D Right Hip (Right)  Patient Location: PACU  Anesthesia Type:General  Level of Consciousness: awake, oriented, sedated, patient cooperative and responds to stimulation  Airway & Oxygen Therapy: Patient Spontanous Breathing and Patient connected to nasal cannula oxygen  Post-op Assessment: Report given to RN, Post -op Vital signs reviewed and stable, Patient moving all extremities and Patient moving all extremities X 4  Post vital signs: Reviewed and stable  Last Vitals:  Filed Vitals:   11/28/14 1605  BP: 125/59  Pulse: 77  Temp: 37.3 C  Resp: 16    Complications: No apparent anesthesia complications

## 2014-11-28 NOTE — Progress Notes (Signed)
Physical Therapy Treatment Patient Details Name: IZOLA TEAGUE MRN: [id number] DOB: 03-12-1956 Today's Date: 12-26-2014    History of Present Illness 59 yo female with pain R hip now having received surgical wash of R hip and will later have revision due to metal on metal degrading    PT Comments    Upon entering room pt stated she was not feeling well. Reported nausea and slight pain in R hip. Pt opted to limit activities this session 2/2 nausea. Pt overall gait was steady but slow. Continue with POC.  Follow Up Recommendations  Home health PT;Supervision/Assistance - 24 hour     Equipment Recommendations  None recommended by PT    Recommendations for Other Services       Precautions / Restrictions Precautions Precautions: Fall;Other (comment) Restrictions Other Position/Activity Restrictions: WBAT    Mobility  Bed Mobility Overal bed mobility: Needs Assistance Bed Mobility: Supine to Sit     Supine to sit: Min assist     General bed mobility comments: Min A for managing BLE onto EOB.  Transfers Overall transfer level: Needs assistance Equipment used: Rolling walker (2 wheeled) Transfers: Sit to/from Stand Sit to Stand: Min assist         General transfer comment: Min A through power up into standing. Cues for hand placement.  Ambulation/Gait Ambulation/Gait assistance: Min guard Ambulation Distance (Feet): 20 Feet Assistive device: Rolling walker (2 wheeled) Gait Pattern/deviations: Step-through pattern;Decreased stride length   Gait velocity interpretation: Below normal speed for age/gender General Gait Details: Slow gait as pt was feeling nauseated. Overall gait was steady. Min G for safety.   Stairs            Wheelchair Mobility    Modified Rankin (Stroke Patients Only)       Balance                                    Cognition Arousal/Alertness: Awake/alert Behavior During Therapy: WFL for tasks  assessed/performed Overall Cognitive Status: Within Functional Limits for tasks assessed                      Exercises      General Comments        Pertinent Vitals/Pain Pain Assessment: 0-10 Pain Score: 2  Pain Location: R hip Pain Intervention(s): Limited activity within patient's tolerance;Monitored during session;Repositioned    Home Living                      Prior Function            PT Goals (current goals can now be found in the care plan section) Progress towards PT goals: Progressing toward goals    Frequency  7X/week    PT Plan Current plan remains appropriate    Co-evaluation             End of Session Equipment Utilized During Treatment: Gait belt Activity Tolerance: Patient limited by pain;Other (comment) (Pt limited by nausea and stated she wasn't feeling well.) Patient left: in bed;with call bell/phone within reach     Time: 3817-7116 PT Time Calculation (min) (ACUTE ONLY): 20 min  Charges:                       G CodesAllyn Kenner, SPTA 12/26/14, 12:30 PM

## 2014-11-28 NOTE — Progress Notes (Addendum)
[facility] FOLLOW UP CONSULT NOTE  Christina French VQX:[id number] DOB: 1955-06-13 DOA: 11/23/2014 PCP: [name], MD  Subjective: Patient reports feeling significantly better than yesterday.  Assessment/Plan: Principal Problem: Sepsis - Secondary to infected right hip periprosthetic collection and C. difficile colitis. Much improved post irrigation and debridement of the right hip, cultures demonstrate MSSA-ID following-currently on IV Vanco/Rifampin. Infectious disease recommends IV ancef and rifampin . She will need prolonged oral abx and will try to pick a less risky abx For CDI infection. She will need 8 weeks of IV therapy + rifampin. Patient back to or tonight for repeat I and D of a right hip  C. 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   History of depression with anxiety - Continue with Xanax, Lexapro, Remeron and trazodone- Monitor sodium.  Hx of Malignant Melanoma in Situ-right leg - Followed at [facility]  Code Status: Full Family Communication: None at bedside Disposition Plan: Remain inpatient DVT Prophylaxis: Lovenox   Consultants:  Orthopedic  Infectious Disease  Procedures:  Irrigation and debridement of right hip 11/25/14  Antibiotics:  Vancomycin 11/24/14 >>  Zosyn 11/24/14 >>11/25/14  Rifampin 11/27/14 >>  Objective: Filed Vitals:   11/28/14 1315  BP: 141/67  Pulse: 85  Temp: 100.4 F (38 C)  Resp: 16    Intake/Output Summary (Last 24 hours) at 11/28/14 1416 Last data filed at 11/28/14 1300  Gross per 24 hour  Intake     480 ml  Output      0 ml  Net    480 ml   Filed Weights   11/24/14 0018  Weight: 90.719 kg (200 lb)    Exam:   General:  Patient is seated in recliner no acute distress, she is alert and oriented x 3 with clear speech   Cardiovascular: S1 S2 RRR, no m/r/g, 2+ DP pulses, 1+ LE edema bilaterally   Respiratory: Clear to auscultation, no w/r/r, normal respiratory effort  Abdomen: Bowel sounds present, soft, non-distended, TTP LLQ, no guarding or peritoneal signs  Musculoskeletal: grossly normal tone, 5/5 strength BLE  Skin: Mild erythema surrounding incision site on right hip, no rashes  Data Reviewed: Basic Metabolic Panel:  Recent Labs Lab 11/24/14 0405 11/24/14 1455 11/25/14 0436 11/26/14 0444 11/27/14 0505  NA 124* 123* 125* 125* 126*  K 3.4* 3.5 3.6 3.3* 2.9*  CL 89* 87* 94* 95* 90*  CO2 25 24 20* 24 27  GLUCOSE 143* 167* 164* 148* 139*  BUN 12 10 8 8 8   CREATININE 1.14* 1.08* 0.86 0.86 0.89  CALCIUM 8.0* 8.4* 8.5* 8.2* 8.8*   Liver Function Tests:  Recent Labs Lab 11/23/14 1245 11/24/14 1455  AST 30 26  ALT 20 17  ALKPHOS 59 62  BILITOT 0.8 0.9  PROT 7.5 7.3  ALBUMIN 4.4 3.6   CBC:  Recent Labs Lab 11/23/14 1245  11/24/14 1455 11/25/14 0436 11/26/14 0444 11/27/14 0505 11/28/14 0527  WBC 22.0*  < > 21.1* 13.1* 7.4 7.3 5.7  NEUTROABS 19.2*  --  18.6*  --   --   --   --   HGB 11.6*  < >  11.5* 10.2* 8.2* 8.4* 8.7*  HCT 34.2*  < > 34.0* 30.0* 23.5* 24.7* 25.6*  MCV 81.5  < > 83.1 81.7 81.3 80.5 81.8  PLT 160  < > 150 126* 120* 158 175  < > = values in this interval not displayed.  Recent Results (from the past 240 hour(s))  Blood culture (routine x 2)     Status: None   Collection Time: 11/23/14  1:32 PM  Result Value Ref Range Status   Specimen Description BLOOD RIGHT HAND  Final   Special Requests BOTTLES DRAWN AEROBIC AND ANAEROBIC  1CC  Final   Culture NO GROWTH 5 DAYS  Final   Report Status 11/28/2014 FINAL  Final  Blood culture  (routine x 2)     Status: None   Collection Time: 11/23/14  2:14 PM  Result Value Ref Range Status   Specimen Description BLOOD LEFT ASSIST CONTROL  Final   Special Requests   Final    BOTTLES DRAWN AEROBIC AND ANAEROBIC  AER [place] ANA [place]   Culture NO GROWTH 5 DAYS  Final   Report Status 11/28/2014 FINAL  Final  Culture, blood (x 2)     Status: None (Preliminary result)   Collection Time: 11/24/14  2:55 PM  Result Value Ref Range Status   Specimen Description BLOOD LEFT ARM  Final   Special Requests IN PEDIATRIC BOTTLE 3CC  Final   Culture NO GROWTH 3 DAYS  Final   Report Status PENDING  Incomplete  Culture, blood (x 2)     Status: None (Preliminary result)   Collection Time: 11/24/14  3:13 PM  Result Value Ref Range Status   Specimen Description BLOOD RIGHT ANTECUBITAL  Final   Special Requests BOTTLES DRAWN AEROBIC ONLY 5CC  Final   Culture NO GROWTH 3 DAYS  Final   Report Status PENDING  Incomplete  Urine culture     Status: None   Collection Time: 11/24/14  4:00 PM  Result Value Ref Range Status   Specimen Description URINE, RANDOM  Final   Special Requests ADDED 2213  Final   Culture NO GROWTH 2 DAYS  Final   Report Status 11/26/2014 FINAL  Final  Stool culture     Status: None (Preliminary result)   Collection Time: 11/24/14 10:16 PM  Result Value Ref Range Status   Specimen Description STOOL  Final   Special Requests NONE  Final   Culture   Final    NO SUSPICIOUS COLONIES, CONTINUING TO HOLD Performed at [facility]    Report Status PENDING  Incomplete  Clostridium Difficile by PCR (not at [facility])     Status: Abnormal   Collection Time: 11/24/14 10:17 PM  Result Value Ref Range Status   Toxigenic C Difficile by pcr POSITIVE (A) NEGATIVE Final    Comment: CRITICAL RESULT CALLED TO, READ BACK BY AND VERIFIED WITH: Leonia Corona, RN AT 1401 11/25/14 BY A DAVIS   Surgical pcr screen     Status: None   Collection Time: 11/25/14  3:45 PM  Result Value Ref Range  Status   MRSA, PCR NEGATIVE NEGATIVE Final   Staphylococcus aureus NEGATIVE NEGATIVE Final    Comment:        The Xpert SA Assay (FDA approved for NASAL specimens in patients over 27 years of age), is one component of a comprehensive surveillance program.  Test performance has been validated by [facility] for patients greater than or equal to 56 year old. It is not  intended to diagnose infection nor to guide or monitor treatment.   Body fluid culture     Status: None (Preliminary result)   Collection Time: 11/25/14  4:51 PM  Result Value Ref Range Status   Specimen Description SYNOVIAL RIGHT HIP  Final   Special Requests SYNOVIAL RIGHT HIP  Final   Gram Stain   Final    ABUNDANT WBC PRESENT,BOTH PMN AND MONONUCLEAR FEW GRAM POSITIVE COCCI IN CLUSTERS IN CHAINS CRITICAL RESULT CALLED TO, READ BACK BY AND VERIFIED WITH: RIMANDO,D RN 11/25/14 2100 [place] CONFIRMED BY Lavenia Atlas    Culture ABUNDANT STAPHYLOCOCCUS AUREUS  Final   Report Status PENDING  Incomplete   Organism ID, Bacteria STAPHYLOCOCCUS AUREUS  Final      Susceptibility   Staphylococcus aureus - MIC*    CIPROFLOXACIN <=0.5 SENSITIVE Sensitive     ERYTHROMYCIN <=0.25 SENSITIVE Sensitive     GENTAMICIN <=0.5 SENSITIVE Sensitive     OXACILLIN 0.5 SENSITIVE Sensitive     TETRACYCLINE <=1 SENSITIVE Sensitive     VANCOMYCIN <=0.5 SENSITIVE Sensitive     TRIMETH/SULFA <=10 SENSITIVE Sensitive     CLINDAMYCIN <=0.25 SENSITIVE Sensitive     RIFAMPIN <=0.5 SENSITIVE Sensitive     Inducible Clindamycin NEGATIVE Sensitive     * ABUNDANT STAPHYLOCOCCUS AUREUS  Anaerobic culture     Status: None (Preliminary result)   Collection Time: 11/25/14  4:54 PM  Result Value Ref Range Status   Specimen Description ABSCESS RIGHT HIP  Final   Special Requests NONE  Final   Gram Stain   Final    FEW WBC PRESENT, PREDOMINANTLY PMN NO SQUAMOUS EPITHELIAL CELLS SEEN FEW GRAM POSITIVE COCCI IN PAIRS IN CLUSTERS Performed at [facility]    Culture   Final    NO ANAEROBES ISOLATED; CULTURE IN PROGRESS FOR 5 DAYS Performed at [facility]    Report Status PENDING  Incomplete  Culture, routine-abscess     Status: None   Collection Time: 11/25/14  4:54 PM  Result Value Ref Range Status   Specimen Description ABSCESS RIGHT HIP  Final   Special Requests NONE  Final   Gram Stain   Final    FEW WBC PRESENT,BOTH PMN AND MONONUCLEAR NO SQUAMOUS EPITHELIAL CELLS SEEN FEW GRAM POSITIVE COCCI IN PAIRS IN CLUSTERS Performed at [facility]    Culture   Final    ABUNDANT STAPHYLOCOCCUS AUREUS Note: RIFAMPIN AND GENTAMICIN SHOULD NOT BE USED AS SINGLE DRUGS FOR TREATMENT OF STAPH INFECTIONS. Performed at [facility]    Report Status 11/28/2014 FINAL  Final   Organism ID, Bacteria STAPHYLOCOCCUS AUREUS  Final      Susceptibility   Staphylococcus aureus - MIC*    CLINDAMYCIN <=0.25 SENSITIVE Sensitive     ERYTHROMYCIN <=0.25 SENSITIVE Sensitive     GENTAMICIN <=0.5 SENSITIVE Sensitive     LEVOFLOXACIN 0.25 SENSITIVE Sensitive     OXACILLIN 0.5 SENSITIVE Sensitive     RIFAMPIN <=0.5 SENSITIVE Sensitive     TRIMETH/SULFA <=10 SENSITIVE Sensitive     VANCOMYCIN 1 SENSITIVE Sensitive     TETRACYCLINE <=1 SENSITIVE Sensitive     MOXIFLOXACIN <=0.25 SENSITIVE Sensitive     * ABUNDANT STAPHYLOCOCCUS AUREUS  Tissue culture     Status: None   Collection Time: 11/25/14  5:43 PM  Result Value Ref Range Status   Specimen Description TISSUE RIGHT HIP  Final   Special Requests NONE  Final   Gram Stain   Final  MODERATE WBC PRESENT,BOTH PMN AND MONONUCLEAR ABUNDANT GRAM POSITIVE COCCI IN PAIRS Gram Stain Report Called to,Read Back By and Verified With: Gram Stain Report Called to,Read Back By and Verified With: Y.Rimando RN on 11/26/14 at 02:58 by Rise Mu Performed at [facility]    Culture   Final    ABUNDANT STAPHYLOCOCCUS AUREUS Note: RIFAMPIN AND GENTAMICIN SHOULD NOT BE USED  AS SINGLE DRUGS FOR TREATMENT OF STAPH INFECTIONS. Performed at [facility]    Report Status 11/28/2014 FINAL  Final   Organism ID, Bacteria STAPHYLOCOCCUS AUREUS  Final      Susceptibility   Staphylococcus aureus - MIC*    CLINDAMYCIN <=0.25 SENSITIVE Sensitive     ERYTHROMYCIN <=0.25 SENSITIVE Sensitive     GENTAMICIN <=0.5 SENSITIVE Sensitive     LEVOFLOXACIN 0.25 SENSITIVE Sensitive     OXACILLIN 0.5 SENSITIVE Sensitive     RIFAMPIN <=0.5 SENSITIVE Sensitive     TRIMETH/SULFA <=10 SENSITIVE Sensitive     VANCOMYCIN <=0.5 SENSITIVE Sensitive     TETRACYCLINE <=1 SENSITIVE Sensitive     MOXIFLOXACIN <=0.25 SENSITIVE Sensitive     * ABUNDANT STAPHYLOCOCCUS AUREUS     Studies: No results found.  Scheduled Meds: . acidophilus  1 capsule Oral Daily  . aspirin EC  325 mg Oral BID PC  . atenolol  50 mg Oral BID  . carbamazepine  300 mg Oral BID  .  ceFAZolin (ANCEF) IV  2 g Intravenous 3 times per day  . escitalopram  20 mg Oral Daily  . furosemide  20 mg Oral Daily  . mirtazapine  22.5 mg Oral QHS  . ondansetron (ZOFRAN) IV  4 mg Intravenous TID AC  . potassium chloride  20 mEq Oral Daily  . rifampin  300 mg Oral Q12H  . traZODone  50 mg Oral QHS  . vancomycin  125 mg Oral QID    Principal Problem:   Acute pain of right hip, right calf pain, ?sepsis Active Problems:   Right hip pain   Hyponatremia   BP (high blood pressure)   Prosthetic hip infection   Staphylococcus aureus infection   Enteritis due to Clostridium difficile   Screen for STD (sexually transmitted disease)   Septic arthritis    Time spent: 30    If 7PM-7AM, please contact night-coverage at [email or web address], password [facility] 11/28/2014, 2:16 PM  LOS: 4 days        [facility]

## 2014-11-28 NOTE — Anesthesia Preprocedure Evaluation (Addendum)
Anesthesia Evaluation  Patient identified by MRN, date of birth, ID band Patient awake    Reviewed: Allergy & Precautions, NPO status , Patient's Chart, lab work & pertinent test results  Airway Mallampati: II  TM Distance: >3 FB Neck ROM: Full    Dental  (+) Teeth Intact, Dental Advisory Given   Pulmonary  breath sounds clear to auscultation        Cardiovascular hypertension, Rhythm:Regular Rate:Normal     Neuro/Psych Seizures -,     GI/Hepatic   Endo/Other    Renal/GU      Musculoskeletal   Abdominal   Peds  Hematology  (+) anemia ,   Anesthesia Other Findings   Reproductive/Obstetrics                            Anesthesia Physical Anesthesia Plan  ASA: III  Anesthesia Plan: General   Post-op Pain Management:    Induction: Intravenous  Airway Management Planned: Oral ETT  Additional Equipment:   Intra-op Plan:   Post-operative Plan:   Informed Consent: I have reviewed the patients History and Physical, chart, labs and discussed the procedure including the risks, benefits and alternatives for the proposed anesthesia with the patient or authorized representative who has indicated his/her understanding and acceptance.   Dental advisory given  Plan Discussed with: CRNA and Anesthesiologist  Anesthesia Plan Comments:         Anesthesia Quick Evaluation

## 2014-11-28 NOTE — Anesthesia Procedure Notes (Signed)
Procedure Name: Intubation Date/Time: 11/28/2014 5:46 PM Performed by: Shirlyn Goltz Pre-anesthesia Checklist: Patient identified, Emergency Drugs available, Suction available and Patient being monitored Patient Re-evaluated:Patient Re-evaluated prior to inductionOxygen Delivery Method: Circle system utilized Preoxygenation: Pre-oxygenation with 100% oxygen Intubation Type: IV induction Ventilation: Mask ventilation without difficulty Laryngoscope Size: Miller and 2 Grade View: Grade I Tube type: Oral Tube size: 7.0 mm Number of attempts: 1 Airway Equipment and Method: Stylet Placement Confirmation: ETT inserted through vocal cords under direct vision,  positive ETCO2 and breath sounds checked- equal and bilateral Secured at: 22 cm Tube secured with: Tape Dental Injury: Teeth and Oropharynx as per pre-operative assessment

## 2014-11-28 NOTE — Brief Op Note (Signed)
11/23/2014 - 11/28/2014  7:19 PM  PATIENT:  Christina French  59 y.o. female  PRE-OPERATIVE DIAGNOSIS:  Infected Right Hip  POST-OPERATIVE DIAGNOSIS:  Infected Right Hip  PROCEDURE:  Procedure(s): Repeat I&D Right Hip (Right)  SURGEON:  Surgeon(s) and Role:    * Mcarthur Rossetti, MD - Primary  ASSISTANTS: none   ANESTHESIA:   general  EBL:   200  BLOOD ADMINISTERED:none  DRAINS: none   LOCAL MEDICATIONS USED:  NONE  SPECIMEN:  No Specimen  DISPOSITION OF SPECIMEN:  N/A  COUNTS:  YES  TOURNIQUET:  * No tourniquets in log *  DICTATION: .Other Dictation: Dictation Number [id number]  PLAN OF CARE: Admit to inpatient   PATIENT DISPOSITION:  PACU - hemodynamically stable.   Delay start of Pharmacological VTE agent (>24hrs) due to surgical blood loss or risk of bleeding: no

## 2014-11-28 NOTE — Progress Notes (Signed)
Patient ID: Christina French, female   DOB: October 24, 1955, 59 y.o.   MRN: [id number] Her WBC continues to remain normal and she feels better.  However, the is still some redness and warmth around her right hip.  She understands fully that we will proceed to surgery this evening for a repeat I&D of her right hip.

## 2014-11-28 NOTE — Progress Notes (Addendum)
Patient temp 102.1. Informed Dr Ninfa Linden. Received verbal order for tylenol q6h prn for fever. Patient will have surgery this afternoon.

## 2014-11-28 NOTE — Progress Notes (Signed)
[facility]    Subjective: Patient feels better today except she is not she is an anxious about surgery this upcoming her diarrhea has improved   Antibiotics:  Anti-infectives    Start     Dose/Rate Route Frequency Ordered Stop   11/27/14 2200  ceFAZolin (ANCEF) IVPB 2 g/50 mL premix     2 g 100 mL/hr over 30 Minutes Intravenous 3 times per day 11/27/14 2118     11/27/14 1000  rifampin (RIFADIN) capsule 300 mg     300 mg Oral Every 12 hours 11/26/14 2046     11/26/14 1200  vancomycin (VANCOCIN) 1,250 mg in sodium chloride 0.9 % 250 mL IVPB  Status:  Discontinued     1,250 mg 166.7 mL/hr over 90 Minutes Intravenous Every 12 hours 11/26/14 0017 11/27/14 2104   11/26/14 0030  vancomycin (VANCOCIN) 1,250 mg in sodium chloride 0.9 % 250 mL IVPB     1,250 mg 166.7 mL/hr over 90 Minutes Intravenous NOW 11/26/14 0017 11/26/14 0315   11/25/14 1756  vancomycin (VANCOCIN) powder  Status:  Discontinued       As needed 11/25/14 1756 11/25/14 1814   11/25/14 1754  gentamicin (GARAMYCIN) injection  Status:  Discontinued       As needed 11/25/14 1755 11/25/14 1814   11/25/14 1700  vancomycin (VANCOCIN) IVPB 1000 mg/200 mL premix  Status:  Discontinued     1,000 mg 200 mL/hr over 60 Minutes Intravenous Every 12 hours 11/25/14 0916 11/25/14 1113   11/25/14 1500  vancomycin (VANCOCIN) 50 mg/mL oral solution 125 mg     125 mg Oral 4 times daily 11/25/14 1415 12/09/14 1359   11/24/14 1200  piperacillin-tazobactam (ZOSYN) IVPB 3.375 g  Status:  Discontinued     3.375 g 12.5 mL/hr over 240 Minutes Intravenous 3 times per day 11/24/14 0210 11/25/14 1113   11/24/14 0700  vancomycin (VANCOCIN) IVPB 750 mg/150 ml premix  Status:  Discontinued     750 mg 150 mL/hr over 60 Minutes Intravenous Every 12 hours 11/24/14 0210 11/25/14 0916   11/24/14 0300  piperacillin-tazobactam (ZOSYN) IVPB 3.375 g     3.375 g 100 mL/hr over 30 Minutes Intravenous  Once 11/24/14 0210  11/24/14 0858      Medications: Scheduled Meds: . acidophilus  1 capsule Oral Daily  . aspirin EC  325 mg Oral BID PC  . atenolol  50 mg Oral BID  . carbamazepine  300 mg Oral BID  .  ceFAZolin (ANCEF) IV  2 g Intravenous 3 times per day  . escitalopram  20 mg Oral Daily  . furosemide  20 mg Oral Daily  . mirtazapine  22.5 mg Oral QHS  . ondansetron (ZOFRAN) IV  4 mg Intravenous TID AC  . pantoprazole  40 mg Oral Daily  . potassium chloride  20 mEq Oral Daily  . rifampin  300 mg Oral Q12H  . traZODone  50 mg Oral QHS  . vancomycin  125 mg Oral QID   Continuous Infusions: . sodium chloride     PRN Meds:.acetaminophen, ALPRAZolam, diphenhydrAMINE, HYDROmorphone (DILAUDID) injection, HYDROmorphone (DILAUDID) injection, methocarbamol, oxyCODONE, promethazine, sodium chloride    Objective: Weight change:   Intake/Output Summary (Last 24 hours) at 11/28/14 1217 Last data filed at 11/27/14 1700  Gross per 24 hour  Intake    720 ml  Output      0 ml  Net    720 ml  Blood pressure 132/70, pulse 98, temperature 99.9 F (37.7 C), temperature source Oral, resp. rate 17, weight 200 lb (90.719 kg), SpO2 98 %. Temp:  [98.6 F (37 C)-99.9 F (37.7 C)] 99.9 F (37.7 C) (08/03 0512) Pulse Rate:  [84-98] 98 (08/03 0512) Resp:  [17-18] 17 (08/03 0512) BP: (115-132)/(62-70) 132/70 mmHg (08/03 0512) SpO2:  [98 %-100 %] 98 % (08/03 0512)  Physical Exam: General: Alert and awake, oriented x3, . HEENT: anicteric sclera,EOMI CVS regular rate, normal r,  no murmur rubs or gallops  Skin: wound not examined today Neuro: nonfocal  CBC:  CBC Latest Ref Rng 11/28/2014 11/27/2014 11/26/2014  WBC 4.0 - 10.5 K/uL 5.7 7.3 7.4  Hemoglobin 12.0 - 15.0 g/dL 8.7(L) 8.4(L) 8.2(L)  Hematocrit 36.0 - 46.0 % 25.6(L) 24.7(L) 23.5(L)  Platelets 150 - 400 K/uL 175 158 120(L)       BMET  Recent Labs  11/26/14 0444 11/27/14 0505  NA 125* 126*  K 3.3* 2.9*  CL 95* 90*  CO2 24 27  GLUCOSE 148*  139*  BUN 8 8  CREATININE 0.86 0.89  CALCIUM 8.2* 8.8*     Liver Panel  No results for input(s): PROT, ALBUMIN, AST, ALT, ALKPHOS, BILITOT, BILIDIR, IBILI in the last 72 hours.     Sedimentation Rate  Recent Labs  11/27/14 0505  ESRSEDRATE 135*   C-Reactive Protein  Recent Labs  11/27/14 0505  CRP 37.2*    Micro Results: Recent Results (from the past 720 hour(s))  Blood culture (routine x 2)     Status: None (Preliminary result)   Collection Time: 11/23/14  1:32 PM  Result Value Ref Range Status   Specimen Description BLOOD RIGHT HAND  Final   Special Requests BOTTLES DRAWN AEROBIC AND ANAEROBIC  1CC  Final   Culture NO GROWTH 4 DAYS  Final   Report Status PENDING  Incomplete  Blood culture (routine x 2)     Status: None (Preliminary result)   Collection Time: 11/23/14  2:14 PM  Result Value Ref Range Status   Specimen Description BLOOD LEFT ASSIST CONTROL  Final   Special Requests   Final    BOTTLES DRAWN AEROBIC AND ANAEROBIC  AER [place] ANA [place]   Culture NO GROWTH 4 DAYS  Final   Report Status PENDING  Incomplete  Culture, blood (x 2)     Status: None (Preliminary result)   Collection Time: 11/24/14  2:55 PM  Result Value Ref Range Status   Specimen Description BLOOD LEFT ARM  Final   Special Requests IN PEDIATRIC BOTTLE 3CC  Final   Culture NO GROWTH 3 DAYS  Final   Report Status PENDING  Incomplete  Culture, blood (x 2)     Status: None (Preliminary result)   Collection Time: 11/24/14  3:13 PM  Result Value Ref Range Status   Specimen Description BLOOD RIGHT ANTECUBITAL  Final   Special Requests BOTTLES DRAWN AEROBIC ONLY 5CC  Final   Culture NO GROWTH 3 DAYS  Final   Report Status PENDING  Incomplete  Urine culture     Status: None   Collection Time: 11/24/14  4:00 PM  Result Value Ref Range Status   Specimen Description URINE, RANDOM  Final   Special Requests ADDED 2213  Final   Culture NO GROWTH 2 DAYS  Final   Report Status 11/26/2014 FINAL   Final  Stool culture     Status: None (Preliminary result)   Collection Time: 11/24/14 10:16 PM  Result Value  Ref Range Status   Specimen Description STOOL  Final   Special Requests NONE  Final   Culture   Final    NO SUSPICIOUS COLONIES, CONTINUING TO HOLD Performed at [facility]    Report Status PENDING  Incomplete  Clostridium Difficile by PCR (not at [facility])     Status: Abnormal   Collection Time: 11/24/14 10:17 PM  Result Value Ref Range Status   Toxigenic C Difficile by pcr POSITIVE (A) NEGATIVE Final    Comment: CRITICAL RESULT CALLED TO, READ BACK BY AND VERIFIED WITH: Leonia Corona, RN AT 1401 11/25/14 BY A DAVIS   Surgical pcr screen     Status: None   Collection Time: 11/25/14  3:45 PM  Result Value Ref Range Status   MRSA, PCR NEGATIVE NEGATIVE Final   Staphylococcus aureus NEGATIVE NEGATIVE Final    Comment:        The Xpert SA Assay (FDA approved for NASAL specimens in patients over 58 years of age), is one component of a comprehensive surveillance program.  Test performance has been validated by [facility] for patients greater than or equal to 57 year old. It is not intended to diagnose infection nor to guide or monitor treatment.   Body fluid culture     Status: None (Preliminary result)   Collection Time: 11/25/14  4:51 PM  Result Value Ref Range Status   Specimen Description SYNOVIAL RIGHT HIP  Final   Special Requests SYNOVIAL RIGHT HIP  Final   Gram Stain   Final    ABUNDANT WBC PRESENT,BOTH PMN AND MONONUCLEAR FEW GRAM POSITIVE COCCI IN CLUSTERS IN CHAINS CRITICAL RESULT CALLED TO, READ BACK BY AND VERIFIED WITH: RIMANDO,D RN 11/25/14 2100 [place] CONFIRMED BY Lavenia Atlas    Culture ABUNDANT STAPHYLOCOCCUS AUREUS  Final   Report Status PENDING  Incomplete   Organism ID, Bacteria STAPHYLOCOCCUS AUREUS  Final      Susceptibility   Staphylococcus aureus - MIC*    CIPROFLOXACIN <=0.5 SENSITIVE Sensitive     ERYTHROMYCIN <=0.25 SENSITIVE  Sensitive     GENTAMICIN <=0.5 SENSITIVE Sensitive     OXACILLIN 0.5 SENSITIVE Sensitive     TETRACYCLINE <=1 SENSITIVE Sensitive     VANCOMYCIN <=0.5 SENSITIVE Sensitive     TRIMETH/SULFA <=10 SENSITIVE Sensitive     CLINDAMYCIN <=0.25 SENSITIVE Sensitive     RIFAMPIN <=0.5 SENSITIVE Sensitive     Inducible Clindamycin NEGATIVE Sensitive     * ABUNDANT STAPHYLOCOCCUS AUREUS  Anaerobic culture     Status: None (Preliminary result)   Collection Time: 11/25/14  4:54 PM  Result Value Ref Range Status   Specimen Description ABSCESS RIGHT HIP  Final   Special Requests NONE  Final   Gram Stain   Final    FEW WBC PRESENT, PREDOMINANTLY PMN NO SQUAMOUS EPITHELIAL CELLS SEEN FEW GRAM POSITIVE COCCI IN PAIRS IN CLUSTERS Performed at [facility]    Culture   Final    NO ANAEROBES ISOLATED; CULTURE IN PROGRESS FOR 5 DAYS Performed at [facility]    Report Status PENDING  Incomplete  Culture, routine-abscess     Status: None   Collection Time: 11/25/14  4:54 PM  Result Value Ref Range Status   Specimen Description ABSCESS RIGHT HIP  Final   Special Requests NONE  Final   Gram Stain   Final    FEW WBC PRESENT,BOTH PMN AND MONONUCLEAR NO SQUAMOUS EPITHELIAL CELLS SEEN FEW GRAM POSITIVE COCCI IN PAIRS IN CLUSTERS Performed  at [facility]    Culture   Final    ABUNDANT STAPHYLOCOCCUS AUREUS Note: RIFAMPIN AND GENTAMICIN SHOULD NOT BE USED AS SINGLE DRUGS FOR TREATMENT OF STAPH INFECTIONS. Performed at [facility]    Report Status 11/28/2014 FINAL  Final   Organism ID, Bacteria STAPHYLOCOCCUS AUREUS  Final      Susceptibility   Staphylococcus aureus - MIC*    CLINDAMYCIN <=0.25 SENSITIVE Sensitive     ERYTHROMYCIN <=0.25 SENSITIVE Sensitive     GENTAMICIN <=0.5 SENSITIVE Sensitive     LEVOFLOXACIN 0.25 SENSITIVE Sensitive     OXACILLIN 0.5 SENSITIVE Sensitive     RIFAMPIN <=0.5 SENSITIVE Sensitive     TRIMETH/SULFA <=10 SENSITIVE Sensitive      VANCOMYCIN 1 SENSITIVE Sensitive     TETRACYCLINE <=1 SENSITIVE Sensitive     MOXIFLOXACIN <=0.25 SENSITIVE Sensitive     * ABUNDANT STAPHYLOCOCCUS AUREUS  Tissue culture     Status: None   Collection Time: 11/25/14  5:43 PM  Result Value Ref Range Status   Specimen Description TISSUE RIGHT HIP  Final   Special Requests NONE  Final   Gram Stain   Final    MODERATE WBC PRESENT,BOTH PMN AND MONONUCLEAR ABUNDANT GRAM POSITIVE COCCI IN PAIRS Gram Stain Report Called to,Read Back By and Verified With: Gram Stain Report Called to,Read Back By and Verified With: Y.Rimando RN on 11/26/14 at 02:58 by Rise Mu Performed at [facility]    Culture   Final    ABUNDANT STAPHYLOCOCCUS AUREUS Note: RIFAMPIN AND GENTAMICIN SHOULD NOT BE USED AS SINGLE DRUGS FOR TREATMENT OF STAPH INFECTIONS. Performed at [facility]    Report Status 11/28/2014 FINAL  Final   Organism ID, Bacteria STAPHYLOCOCCUS AUREUS  Final      Susceptibility   Staphylococcus aureus - MIC*    CLINDAMYCIN <=0.25 SENSITIVE Sensitive     ERYTHROMYCIN <=0.25 SENSITIVE Sensitive     GENTAMICIN <=0.5 SENSITIVE Sensitive     LEVOFLOXACIN 0.25 SENSITIVE Sensitive     OXACILLIN 0.5 SENSITIVE Sensitive     RIFAMPIN <=0.5 SENSITIVE Sensitive     TRIMETH/SULFA <=10 SENSITIVE Sensitive     VANCOMYCIN <=0.5 SENSITIVE Sensitive     TETRACYCLINE <=1 SENSITIVE Sensitive     MOXIFLOXACIN <=0.25 SENSITIVE Sensitive     * ABUNDANT STAPHYLOCOCCUS AUREUS    Studies/Results: No results found.    Assessment/Plan:  Principal Problem:   Acute pain of right hip, right calf pain, ?sepsis Active Problems:   Right hip pain   Hyponatremia   BP (high blood pressure)   Prosthetic hip infection   Staphylococcus aureus infection   Enteritis due to Clostridium difficile   Screen for STD (sexually transmitted disease)   Septic arthritis    PATI THINNES is a 59 y.o. 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 Ninfa Linden taking her back to the OR tonight   #2 CDI: difficult to be sure given her presentation and fact that new stategy for testing not implemented yet  Will give her 30  day course  As above risk higher for relapse with ceph. Woud swap PPI for pepcid if on this med.  No role for probiotics.   LOS: 4 days   Alcide Evener 11/28/2014, 12:17 PM

## 2014-11-28 NOTE — Anesthesia Postprocedure Evaluation (Signed)
  Anesthesia Post-op Note  Patient: Christina French  Procedure(s) Performed: Procedure(s): Repeat I&D Right Hip (Right)  Patient Location: PACU  Anesthesia Type:General  Level of Consciousness: awake, alert  and sedated  Airway and Oxygen Therapy: Patient Spontanous Breathing  Post-op Pain: mild  Post-op Assessment: Post-op Vital signs reviewed LLE Motor Response: Purposeful movement, Responds to commands LLE Sensation: Full sensation RLE Motor Response: Purposeful movement, Responds to commands RLE Sensation: Full sensation      Post-op Vital Signs: stable  Last Vitals:  Filed Vitals:   11/28/14 1945  BP: 125/60  Pulse: 79  Temp:   Resp: 15    Complications: No apparent anesthesia complications

## 2014-11-29 ENCOUNTER — Encounter (HOSPITAL_COMMUNITY): Payer: Self-pay | Admitting: Orthopaedic Surgery

## 2014-11-29 DIAGNOSIS — T8459XD Infection and inflammatory reaction due to other internal joint prosthesis, subsequent encounter: Secondary | ICD-10-CM

## 2014-11-29 DIAGNOSIS — Z113 Encounter for screening for infections with a predominantly sexual mode of transmission: Secondary | ICD-10-CM

## 2014-11-29 DIAGNOSIS — M009 Pyogenic arthritis, unspecified: Secondary | ICD-10-CM

## 2014-11-29 DIAGNOSIS — A4901 Methicillin susceptible Staphylococcus aureus infection, unspecified site: Secondary | ICD-10-CM

## 2014-11-29 LAB — COMPREHENSIVE METABOLIC PANEL
ALK PHOS: 83 U/L (ref 38–126)
ALT: 17 U/L (ref 14–54)
AST: 19 U/L (ref 15–41)
Albumin: 2.1 g/dL — ABNORMAL LOW (ref 3.5–5.0)
Anion gap: 9 (ref 5–15)
BILIRUBIN TOTAL: 1.1 mg/dL (ref 0.3–1.2)
BUN: 8 mg/dL (ref 6–20)
CO2: 29 mmol/L (ref 22–32)
Calcium: 8.3 mg/dL — ABNORMAL LOW (ref 8.9–10.3)
Chloride: 92 mmol/L — ABNORMAL LOW (ref 101–111)
Creatinine, Ser: 0.9 mg/dL (ref 0.44–1.00)
GFR calc non Af Amer: >60 mL/min (ref >60)
GLUCOSE: 137 mg/dL — AB (ref 65–99)
Potassium: 2.9 mmol/L — ABNORMAL LOW (ref 3.5–5.1)
Sodium: 130 mmol/L — ABNORMAL LOW (ref 135–145)
Total Protein: 5.6 g/dL — ABNORMAL LOW (ref 6.5–8.1)

## 2014-11-29 LAB — ANAEROBIC CULTURE

## 2014-11-29 LAB — CBC
HCT: 22.9 % — ABNORMAL LOW (ref 36.0–46.0)
HEMOGLOBIN: 7.6 g/dL — AB (ref 12.0–15.0)
MCH: 27 pg (ref 26.0–34.0)
MCHC: 33.2 g/dL (ref 30.0–36.0)
MCV: 81.5 fL (ref 78.0–100.0)
PLATELETS: 209 10*3/uL (ref 150–400)
RBC: 2.81 MIL/uL — ABNORMAL LOW (ref 3.87–5.11)
RDW: 14.9 % (ref 11.5–15.5)
WBC: 6.4 10*3/uL (ref 4.0–10.5)

## 2014-11-29 LAB — BODY FLUID CULTURE

## 2014-11-29 LAB — POCT I-STAT 4, (NA,K, GLUC, HGB,HCT)
Glucose, Bld: 148 mg/dL — ABNORMAL HIGH (ref 65–99)
HEMATOCRIT: 24 % — AB (ref 36.0–46.0)
Hemoglobin: 8.2 g/dL — ABNORMAL LOW (ref 12.0–15.0)
Potassium: 3.1 mmol/L — ABNORMAL LOW (ref 3.5–5.1)
SODIUM: 131 mmol/L — AB (ref 135–145)

## 2014-11-29 LAB — STOOL CULTURE

## 2014-11-29 LAB — CULTURE, BLOOD (ROUTINE X 2)
Culture: NO GROWTH
Culture: NO GROWTH

## 2014-11-29 LAB — MAGNESIUM: MAGNESIUM: 1.5 mg/dL — AB (ref 1.7–2.4)

## 2014-11-29 LAB — PREPARE RBC (CROSSMATCH)

## 2014-11-29 MED ORDER — MAGNESIUM SULFATE 2 GM/50ML IV SOLN
2.0000 g | Freq: Once | INTRAVENOUS | Status: AC
  Administered 2014-11-29: 2 g via INTRAVENOUS
  Filled 2014-11-29: qty 50

## 2014-11-29 MED ORDER — POTASSIUM CHLORIDE CRYS ER 20 MEQ PO TBCR
40.0000 meq | EXTENDED_RELEASE_TABLET | Freq: Three times a day (TID) | ORAL | Status: AC
  Administered 2014-11-29 – 2014-11-30 (×4): 40 meq via ORAL
  Filled 2014-11-29 (×4): qty 2

## 2014-11-29 MED ORDER — SODIUM CHLORIDE 0.9 % IV SOLN
Freq: Once | INTRAVENOUS | Status: AC
  Administered 2014-11-29: 17:00:00 via INTRAVENOUS

## 2014-11-29 NOTE — Progress Notes (Signed)
Subjective: 1 Day Post-Op Procedure(s) (LRB): Repeat I&D Right Hip (Right) Patient reports pain as moderate.  Acute blood loss anemia from surgery.  Patient reports fatigue.  However leg pain much less.  Has been up with therapy.  Hemovac drain unfortunately cam out.  Objective: Vital signs in last 24 hours: Temp:  [98.2 F (36.8 C)-100.4 F (38 C)] 98.6 F (37 C) (08/04 0508) Pulse Rate:  [77-85] 78 (08/04 0508) Resp:  [13-16] 16 (08/04 0508) BP: (98-141)/(58-89) 126/67 mmHg (08/04 0508) SpO2:  [91 %-100 %] 99 % (08/04 0508)  Intake/Output from previous day: 08/03 0701 - 08/04 0700 In: 2280 [P.O.:480; I.V.:1800] Out: 100 [Blood:100] Intake/Output this shift: Total I/O In: 240 [P.O.:240] Out: -    Recent Labs  11/27/14 0505 11/28/14 0527 11/28/14 1703 11/29/14 0525  HGB 8.4* 8.7* 8.2* 7.6*    Recent Labs  11/28/14 0527 11/28/14 1703 11/29/14 0525  WBC 5.7  --  6.4  RBC 3.13*  --  2.81*  HCT 25.6* 24.0* 22.9*  PLT 175  --  209    Recent Labs  11/27/14 0505 11/28/14 1703 11/29/14 0525  NA 126* 131* 130*  K 2.9* 3.1* 2.9*  CL 90*  --  92*  CO2 27  --  29  BUN 8  --  8  CREATININE 0.89  --  0.90  GLUCOSE 139* 148* 137*  CALCIUM 8.8*  --  8.3*   No results for input(s): LABPT, INR in the last 72 hours.  Intact pulses distally Dorsiflexion/Plantar flexion intact Incision: scant drainage No cellulitis present Compartment soft  Much improved post-op from yesterday - no redness at all.   Assessment/Plan: 1 Day Post-Op Procedure(s) (LRB): Repeat I&D Right Hip (Right) Up with therapy  Transfuse today. Will likely need SNF placement since lives alone.  Pasco Marchitto Y 11/29/2014, 11:42 AM

## 2014-11-29 NOTE — Progress Notes (Signed)
[facility]    Subjective: More loose stools today   Antibiotics:  Anti-infectives    Start     Dose/Rate Route Frequency Ordered Stop   11/27/14 2200  ceFAZolin (ANCEF) IVPB 2 g/50 mL premix     2 g 100 mL/hr over 30 Minutes Intravenous 3 times per day 11/27/14 2118     11/27/14 1000  rifampin (RIFADIN) capsule 300 mg     300 mg Oral Every 12 hours 11/26/14 2046     11/26/14 1200  vancomycin (VANCOCIN) 1,250 mg in sodium chloride 0.9 % 250 mL IVPB  Status:  Discontinued     1,250 mg 166.7 mL/hr over 90 Minutes Intravenous Every 12 hours 11/26/14 0017 11/27/14 2104   11/26/14 0030  vancomycin (VANCOCIN) 1,250 mg in sodium chloride 0.9 % 250 mL IVPB     1,250 mg 166.7 mL/hr over 90 Minutes Intravenous NOW 11/26/14 0017 11/26/14 0315   11/25/14 1756  vancomycin (VANCOCIN) powder  Status:  Discontinued       As needed 11/25/14 1756 11/25/14 1814   11/25/14 1754  gentamicin (GARAMYCIN) injection  Status:  Discontinued       As needed 11/25/14 1755 11/25/14 1814   11/25/14 1700  vancomycin (VANCOCIN) IVPB 1000 mg/200 mL premix  Status:  Discontinued     1,000 mg 200 mL/hr over 60 Minutes Intravenous Every 12 hours 11/25/14 0916 11/25/14 1113   11/25/14 1500  vancomycin (VANCOCIN) 50 mg/mL oral solution 125 mg     125 mg Oral 4 times daily 11/25/14 1415 12/09/14 1359   11/24/14 1200  piperacillin-tazobactam (ZOSYN) IVPB 3.375 g  Status:  Discontinued     3.375 g 12.5 mL/hr over 240 Minutes Intravenous 3 times per day 11/24/14 0210 11/25/14 1113   11/24/14 0700  vancomycin (VANCOCIN) IVPB 750 mg/150 ml premix  Status:  Discontinued     750 mg 150 mL/hr over 60 Minutes Intravenous Every 12 hours 11/24/14 0210 11/25/14 0916   11/24/14 0300  piperacillin-tazobactam (ZOSYN) IVPB 3.375 g     3.375 g 100 mL/hr over 30 Minutes Intravenous  Once 11/24/14 0210 11/24/14 0858      Medications: Scheduled Meds: . sodium chloride   Intravenous Once  .  acidophilus  1 capsule Oral Daily  . aspirin EC  325 mg Oral BID PC  . atenolol  50 mg Oral BID  . carbamazepine  300 mg Oral BID  .  ceFAZolin (ANCEF) IV  2 g Intravenous 3 times per day  . escitalopram  20 mg Oral Daily  . furosemide  20 mg Oral Daily  . magnesium sulfate 1 - 4 g bolus IVPB  2 g Intravenous Once  . mirtazapine  22.5 mg Oral QHS  . ondansetron (ZOFRAN) IV  4 mg Intravenous TID AC  . potassium chloride  40 mEq Oral TID  . rifampin  300 mg Oral Q12H  . traZODone  50 mg Oral QHS  . vancomycin  125 mg Oral QID   Continuous Infusions: . lactated ringers Stopped (11/28/14 1900)   PRN Meds:.acetaminophen, ALPRAZolam, diphenhydrAMINE, famotidine, HYDROmorphone (DILAUDID) injection, methocarbamol, oxyCODONE, promethazine, sodium chloride    Objective: Weight change:   Intake/Output Summary (Last 24 hours) at 11/29/14 1446 Last data filed at 11/29/14 0900  Gross per 24 hour  Intake   2280 ml  Output    100 ml  Net   2180 ml   Blood pressure 126/67, pulse 78,  temperature 98.6 F (37 C), temperature source Oral, resp. rate 16, weight 200 lb (90.719 kg), SpO2 99 %. Temp:  [98.2 F (36.8 C)-100 F (37.8 C)] 98.6 F (37 C) (08/04 0508) Pulse Rate:  [77-83] 78 (08/04 0508) Resp:  [13-16] 16 (08/04 0508) BP: (98-129)/(58-89) 126/67 mmHg (08/04 0508) SpO2:  [91 %-100 %] 99 % (08/04 0508)  Physical Exam: General: Alert and awake, oriented x3, .lying in bed HEENT: anicteric sclera,EOMI CVS regular rate, normal r,  no murmur rubs or gallops GI: nondistended Skin: inciscion not examined today Neuro: nonfocal  CBC:  CBC Latest Ref Rng 11/29/2014 11/28/2014 11/28/2014  WBC 4.0 - 10.5 K/uL 6.4 - 5.7  Hemoglobin 12.0 - 15.0 g/dL 7.6(L) 8.2(L) 8.7(L)  Hematocrit 36.0 - 46.0 % 22.9(L) 24.0(L) 25.6(L)  Platelets 150 - 400 K/uL 209 - 175       BMET  Recent Labs  11/27/14 0505 11/28/14 1703 11/29/14 0525  NA 126* 131* 130*  K 2.9* 3.1* 2.9*  CL 90*  --  92*  CO2  27  --  29  GLUCOSE 139* 148* 137*  BUN 8  --  8  CREATININE 0.89  --  0.90  CALCIUM 8.8*  --  8.3*     Liver Panel   Recent Labs  11/29/14 0525  PROT 5.6*  ALBUMIN 2.1*  AST 19  ALT 17  ALKPHOS 83  BILITOT 1.1       Sedimentation Rate  Recent Labs  11/27/14 0505  ESRSEDRATE 135*   C-Reactive Protein  Recent Labs  11/27/14 0505  CRP 37.2*    Micro Results: Recent Results (from the past 720 hour(s))  Blood culture (routine x 2)     Status: None   Collection Time: 11/23/14  1:32 PM  Result Value Ref Range Status   Specimen Description BLOOD RIGHT HAND  Final   Special Requests BOTTLES DRAWN AEROBIC AND ANAEROBIC  1CC  Final   Culture NO GROWTH 5 DAYS  Final   Report Status 11/28/2014 FINAL  Final  Blood culture (routine x 2)     Status: None   Collection Time: 11/23/14  2:14 PM  Result Value Ref Range Status   Specimen Description BLOOD LEFT ASSIST CONTROL  Final   Special Requests   Final    BOTTLES DRAWN AEROBIC AND ANAEROBIC  AER [place] ANA [place]   Culture NO GROWTH 5 DAYS  Final   Report Status 11/28/2014 FINAL  Final  Culture, blood (x 2)     Status: None   Collection Time: 11/24/14  2:55 PM  Result Value Ref Range Status   Specimen Description BLOOD LEFT ARM  Final   Special Requests IN PEDIATRIC BOTTLE 3CC  Final   Culture NO GROWTH 5 DAYS  Final   Report Status 11/29/2014 FINAL  Final  Culture, blood (x 2)     Status: None   Collection Time: 11/24/14  3:13 PM  Result Value Ref Range Status   Specimen Description BLOOD RIGHT ANTECUBITAL  Final   Special Requests BOTTLES DRAWN AEROBIC ONLY 5CC  Final   Culture NO GROWTH 5 DAYS  Final   Report Status 11/29/2014 FINAL  Final  Urine culture     Status: None   Collection Time: 11/24/14  4:00 PM  Result Value Ref Range Status   Specimen Description URINE, RANDOM  Final   Special Requests ADDED 2213  Final   Culture NO GROWTH 2 DAYS  Final   Report Status 11/26/2014 FINAL  Final  Stool culture      Status: None   Collection Time: 11/24/14 10:16 PM  Result Value Ref Range Status   Specimen Description STOOL  Final   Special Requests NONE  Final   Culture   Final    NO SALMONELLA, SHIGELLA, CAMPYLOBACTER, YERSINIA, OR E.COLI 0157:H7 ISOLATED Performed at [facility]    Report Status 11/29/2014 FINAL  Final  Clostridium Difficile by PCR (not at [facility])     Status: Abnormal   Collection Time: 11/24/14 10:17 PM  Result Value Ref Range Status   Toxigenic C Difficile by pcr POSITIVE (A) NEGATIVE Final    Comment: CRITICAL RESULT CALLED TO, READ BACK BY AND VERIFIED WITH: Leonia Corona, RN AT 1401 11/25/14 BY A DAVIS   Surgical pcr screen     Status: None   Collection Time: 11/25/14  3:45 PM  Result Value Ref Range Status   MRSA, PCR NEGATIVE NEGATIVE Final   Staphylococcus aureus NEGATIVE NEGATIVE Final    Comment:        The Xpert SA Assay (FDA approved for NASAL specimens in patients over 40 years of age), is one component of a comprehensive surveillance program.  Test performance has been validated by [facility] for patients greater than or equal to 79 year old. It is not intended to diagnose infection nor to guide or monitor treatment.   Body fluid culture     Status: None   Collection Time: 11/25/14  4:51 PM  Result Value Ref Range Status   Specimen Description SYNOVIAL RIGHT HIP  Final   Special Requests SYNOVIAL RIGHT HIP  Final   Gram Stain   Final    ABUNDANT WBC PRESENT,BOTH PMN AND MONONUCLEAR FEW GRAM POSITIVE COCCI IN CLUSTERS IN CHAINS CRITICAL RESULT CALLED TO, READ BACK BY AND VERIFIED WITH: RIMANDO,D RN 11/25/14 2100 [place] [employer] CONFIRMED BY V WILIKINS    Culture ABUNDANT STAPHYLOCOCCUS AUREUS  Final   Report Status 11/29/2014 FINAL  Final   Organism ID, Bacteria STAPHYLOCOCCUS AUREUS  Final      Susceptibility   Staphylococcus aureus - MIC*    CIPROFLOXACIN <=0.5 SENSITIVE Sensitive     ERYTHROMYCIN <=0.25 SENSITIVE Sensitive     GENTAMICIN <=0.5  SENSITIVE Sensitive     OXACILLIN 0.5 SENSITIVE Sensitive     TETRACYCLINE <=1 SENSITIVE Sensitive     VANCOMYCIN <=0.5 SENSITIVE Sensitive     TRIMETH/SULFA <=10 SENSITIVE Sensitive     CLINDAMYCIN <=0.25 SENSITIVE Sensitive     RIFAMPIN <=0.5 SENSITIVE Sensitive     Inducible Clindamycin NEGATIVE Sensitive     * ABUNDANT STAPHYLOCOCCUS AUREUS  Anaerobic culture     Status: None   Collection Time: 11/25/14  4:54 PM  Result Value Ref Range Status   Specimen Description ABSCESS RIGHT HIP  Final   Special Requests NONE  Final   Gram Stain   Final    FEW WBC PRESENT, PREDOMINANTLY PMN NO SQUAMOUS EPITHELIAL CELLS SEEN FEW GRAM POSITIVE COCCI IN PAIRS IN CLUSTERS Performed at [facility]    Culture   Final    NO ANAEROBES ISOLATED Performed at [facility]    Report Status 11/29/2014 FINAL  Final  Culture, routine-abscess     Status: None   Collection Time: 11/25/14  4:54 PM  Result Value Ref Range Status   Specimen Description ABSCESS RIGHT HIP  Final   Special Requests NONE  Final   Gram Stain   Final    FEW WBC PRESENT,BOTH  PMN AND MONONUCLEAR NO SQUAMOUS EPITHELIAL CELLS SEEN FEW GRAM POSITIVE COCCI IN PAIRS IN CLUSTERS Performed at [facility]    Culture   Final    ABUNDANT STAPHYLOCOCCUS AUREUS Note: RIFAMPIN AND GENTAMICIN SHOULD NOT BE USED AS SINGLE DRUGS FOR TREATMENT OF STAPH INFECTIONS. Performed at [facility]    Report Status 11/28/2014 FINAL  Final   Organism ID, Bacteria STAPHYLOCOCCUS AUREUS  Final      Susceptibility   Staphylococcus aureus - MIC*    CLINDAMYCIN <=0.25 SENSITIVE Sensitive     ERYTHROMYCIN <=0.25 SENSITIVE Sensitive     GENTAMICIN <=0.5 SENSITIVE Sensitive     LEVOFLOXACIN 0.25 SENSITIVE Sensitive     OXACILLIN 0.5 SENSITIVE Sensitive     RIFAMPIN <=0.5 SENSITIVE Sensitive     TRIMETH/SULFA <=10 SENSITIVE Sensitive     VANCOMYCIN 1 SENSITIVE Sensitive     TETRACYCLINE <=1 SENSITIVE Sensitive      MOXIFLOXACIN <=0.25 SENSITIVE Sensitive     * ABUNDANT STAPHYLOCOCCUS AUREUS  Tissue culture     Status: None   Collection Time: 11/25/14  5:43 PM  Result Value Ref Range Status   Specimen Description TISSUE RIGHT HIP  Final   Special Requests NONE  Final   Gram Stain   Final    MODERATE WBC PRESENT,BOTH PMN AND MONONUCLEAR ABUNDANT GRAM POSITIVE COCCI IN PAIRS Gram Stain Report Called to,Read Back By and Verified With: Gram Stain Report Called to,Read Back By and Verified With: Y.Rimando RN on 11/26/14 at 02:58 by Rise Mu Performed at [facility]    Culture   Final    ABUNDANT STAPHYLOCOCCUS AUREUS Note: RIFAMPIN AND GENTAMICIN SHOULD NOT BE USED AS SINGLE DRUGS FOR TREATMENT OF STAPH INFECTIONS. Performed at [facility]    Report Status 11/28/2014 FINAL  Final   Organism ID, Bacteria STAPHYLOCOCCUS AUREUS  Final      Susceptibility   Staphylococcus aureus - MIC*    CLINDAMYCIN <=0.25 SENSITIVE Sensitive     ERYTHROMYCIN <=0.25 SENSITIVE Sensitive     GENTAMICIN <=0.5 SENSITIVE Sensitive     LEVOFLOXACIN 0.25 SENSITIVE Sensitive     OXACILLIN 0.5 SENSITIVE Sensitive     RIFAMPIN <=0.5 SENSITIVE Sensitive     TRIMETH/SULFA <=10 SENSITIVE Sensitive     VANCOMYCIN <=0.5 SENSITIVE Sensitive     TETRACYCLINE <=1 SENSITIVE Sensitive     MOXIFLOXACIN <=0.25 SENSITIVE Sensitive     * ABUNDANT STAPHYLOCOCCUS AUREUS    Studies/Results: No results found.    Assessment/Plan:  Principal Problem:   Acute pain of right hip; hip infection Active Problems:   Right hip pain   Hyponatremia   BP (high blood pressure)   Prosthetic hip infection   Staphylococcus aureus infection   Enteritis due to Clostridium difficile   Screen for STD (sexually transmitted disease)   Septic arthritis    Christina French is a 59 y.o. 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 Woud swap PPI for pepcid if on this med.  No role for probiotics.   LOS: 5 days   Alcide Evener 11/29/2014, 2:46 PM

## 2014-11-29 NOTE — Progress Notes (Addendum)
[facility] FOLLOW UP CONSULT NOTE  Christina French OTL:[id number] DOB: October 20, 1955 DOA: 11/23/2014 PCP: [name], MD  Subjective: Patient reports feeling significantly better than yesterday.  Assessment/Plan: Principal Problem: Sepsis - Secondary to infected right hip periprosthetic collection and C. difficile colitis. Much improved post irrigation and debridement of the right hip, cultures demonstrate MSSA-ID following-currently on IV cefazolin/ rifampin. Infectious disease recommends IV ancef and rifampin . She will need prolonged oral abx and will try to pick a less risky antibiotics in the setting of CDI infection. She will need 8 weeks of IV therapy + rifampin.  Status post repeat Repeat I&D Right Hip (Right) 8/3  Acute blood loss anemia Hemoglobin drop from 8.7 to 7.6 Orthopedics transfusing  C. difficile colitis-on by mouth vancomycin, treat for 14 days   Chronic Hyponatremia - Suspect SIADH, vs dehydration in the setting of C. difficile, continue low-dose Lasix , start patient on fluid restriction, slight improvement in sodium after being treated with normal saline yesterday, will DC IV fluids and watch sodium levels, probably back to baseline today Patient has had a low sodium for at least several years, baseline around 128-130   Hypokalemia - Likely due GI loss and Lasix.Replete and recheck in am  Hypertension  - Controlled, continue atenolol  Seizure disorder - Controlled, continue with carbamazepine- Monitor sodium.   History of depression with anxiety - Continue with Xanax, Lexapro, Remeron and trazodone- Monitor sodium.  Hx of Malignant Melanoma in Situ-right leg - Followed at [facility]  Code Status: Full Family Communication: None at bedside Disposition Plan: Remain inpatient DVT Prophylaxis: Lovenox   Consultants:  Orthopedic  Infectious Disease  Procedures:  Irrigation and debridement of right hip 11/25/14  Antibiotics:  Vancomycin  11/24/14 >>  Zosyn 11/24/14 >>11/25/14  Rifampin 11/27/14 >>  Objective: Filed Vitals:   11/29/14 0508  BP: 126/67  Pulse: 78  Temp: 98.6 F (37 C)  Resp: 16    Intake/Output Summary (Last 24 hours) at 11/29/14 1318 Last data filed at 11/29/14 0900  Gross per 24 hour  Intake   2280 ml  Output    100 ml  Net   2180 ml   Filed Weights   11/24/14 0018  Weight: 90.719 kg (200 lb)    Exam:   General:  Patient is seated in recliner no acute distress, she is alert and oriented x 3 with clear speech   Cardiovascular: S1 S2 RRR, no m/r/g, 2+ DP pulses, 1+ LE edema bilaterally   Respiratory: Clear to auscultation, no w/r/r, normal respiratory effort  Abdomen: Bowel sounds present, soft, non-distended, TTP LLQ, no guarding or peritoneal signs  Musculoskeletal: grossly normal tone, 5/5 strength BLE  Skin: Mild erythema surrounding incision site on right hip, no rashes  Data Reviewed: Basic Metabolic Panel:  Recent Labs Lab 11/24/14 1455 11/25/14 0436 11/26/14 0444 11/27/14 0505 11/28/14 1703 11/29/14 0525  NA 123* 125* 125* 126* 131* 130*  K 3.5 3.6 3.3* 2.9* 3.1* 2.9*  CL 87* 94* 95* 90*  --  92*  CO2 24 20* 24 27  --  29  GLUCOSE 167* 164* 148* 139* 148* 137*  BUN 10 8 8 8   --  8  CREATININE 1.08* 0.86 0.86 0.89  --  0.90  CALCIUM 8.4* 8.5* 8.2* 8.8*  --  8.3*   Liver Function Tests:  Recent Labs Lab 11/23/14 1245 11/24/14 1455 11/29/14 0525  AST 30 26 19   ALT 20 17 17   ALKPHOS 59 62 83  BILITOT  0.8 0.9 1.1  PROT 7.5 7.3 5.6*  ALBUMIN 4.4 3.6 2.1*   CBC:  Recent Labs Lab 11/23/14 1245  11/24/14 1455 11/25/14 0436 11/26/14 0444 11/27/14 0505 11/28/14 0527 11/28/14 1703 11/29/14 0525  WBC 22.0*  < > 21.1* 13.1* 7.4 7.3 5.7  --  6.4  NEUTROABS 19.2*  --  18.6*  --   --   --   --   --   --   HGB 11.6*  < > 11.5* 10.2* 8.2* 8.4* 8.7* 8.2* 7.6*  HCT 34.2*  < > 34.0* 30.0* 23.5* 24.7* 25.6* 24.0* 22.9*  MCV 81.5  < > 83.1 81.7 81.3 80.5 81.8  --   81.5  PLT 160  < > 150 126* 120* 158 175  --  209  < > = values in this interval not displayed.  Recent Results (from the past 240 hour(s))  Blood culture (routine x 2)     Status: None   Collection Time: 11/23/14  1:32 PM  Result Value Ref Range Status   Specimen Description BLOOD RIGHT HAND  Final   Special Requests BOTTLES DRAWN AEROBIC AND ANAEROBIC  1CC  Final   Culture NO GROWTH 5 DAYS  Final   Report Status 11/28/2014 FINAL  Final  Blood culture (routine x 2)     Status: None   Collection Time: 11/23/14  2:14 PM  Result Value Ref Range Status   Specimen Description BLOOD LEFT ASSIST CONTROL  Final   Special Requests   Final    BOTTLES DRAWN AEROBIC AND ANAEROBIC  AER [place] ANA [place]   Culture NO GROWTH 5 DAYS  Final   Report Status 11/28/2014 FINAL  Final  Culture, blood (x 2)     Status: None (Preliminary result)   Collection Time: 11/24/14  2:55 PM  Result Value Ref Range Status   Specimen Description BLOOD LEFT ARM  Final   Special Requests IN PEDIATRIC BOTTLE 3CC  Final   Culture NO GROWTH 4 DAYS  Final   Report Status PENDING  Incomplete  Culture, blood (x 2)     Status: None (Preliminary result)   Collection Time: 11/24/14  3:13 PM  Result Value Ref Range Status   Specimen Description BLOOD RIGHT ANTECUBITAL  Final   Special Requests BOTTLES DRAWN AEROBIC ONLY 5CC  Final   Culture NO GROWTH 4 DAYS  Final   Report Status PENDING  Incomplete  Urine culture     Status: None   Collection Time: 11/24/14  4:00 PM  Result Value Ref Range Status   Specimen Description URINE, RANDOM  Final   Special Requests ADDED 2213  Final   Culture NO GROWTH 2 DAYS  Final   Report Status 11/26/2014 FINAL  Final  Stool culture     Status: None   Collection Time: 11/24/14 10:16 PM  Result Value Ref Range Status   Specimen Description STOOL  Final   Special Requests NONE  Final   Culture   Final    NO SALMONELLA, SHIGELLA, CAMPYLOBACTER, YERSINIA, OR E.COLI 0157:H7 ISOLATED Performed  at [facility]    Report Status 11/29/2014 FINAL  Final  Clostridium Difficile by PCR (not at [facility])     Status: Abnormal   Collection Time: 11/24/14 10:17 PM  Result Value Ref Range Status   Toxigenic C Difficile by pcr POSITIVE (A) NEGATIVE Final    Comment: CRITICAL RESULT CALLED TO, READ BACK BY AND VERIFIED WITH: Leonia Corona, RN AT 1401 11/25/14 BY A  DAVIS   Surgical pcr screen     Status: None   Collection Time: 11/25/14  3:45 PM  Result Value Ref Range Status   MRSA, PCR NEGATIVE NEGATIVE Final   Staphylococcus aureus NEGATIVE NEGATIVE Final    Comment:        The Xpert SA Assay (FDA approved for NASAL specimens in patients over 50 years of age), is one component of a comprehensive surveillance program.  Test performance has been validated by [facility] for patients greater than or equal to 68 year old. It is not intended to diagnose infection nor to guide or monitor treatment.   Body fluid culture     Status: None   Collection Time: 11/25/14  4:51 PM  Result Value Ref Range Status   Specimen Description SYNOVIAL RIGHT HIP  Final   Special Requests SYNOVIAL RIGHT HIP  Final   Gram Stain   Final    ABUNDANT WBC PRESENT,BOTH PMN AND MONONUCLEAR FEW GRAM POSITIVE COCCI IN CLUSTERS IN CHAINS CRITICAL RESULT CALLED TO, READ BACK BY AND VERIFIED WITH: RIMANDO,D RN 11/25/14 2100 [place] CONFIRMED BY V WILIKINS    Culture ABUNDANT STAPHYLOCOCCUS AUREUS  Final   Report Status 11/29/2014 FINAL  Final   Organism ID, Bacteria STAPHYLOCOCCUS AUREUS  Final      Susceptibility   Staphylococcus aureus - MIC*    CIPROFLOXACIN <=0.5 SENSITIVE Sensitive     ERYTHROMYCIN <=0.25 SENSITIVE Sensitive     GENTAMICIN <=0.5 SENSITIVE Sensitive     OXACILLIN 0.5 SENSITIVE Sensitive     TETRACYCLINE <=1 SENSITIVE Sensitive     VANCOMYCIN <=0.5 SENSITIVE Sensitive     TRIMETH/SULFA <=10 SENSITIVE Sensitive     CLINDAMYCIN <=0.25 SENSITIVE Sensitive     RIFAMPIN <=0.5 SENSITIVE  Sensitive     Inducible Clindamycin NEGATIVE Sensitive     * ABUNDANT STAPHYLOCOCCUS AUREUS  Anaerobic culture     Status: None   Collection Time: 11/25/14  4:54 PM  Result Value Ref Range Status   Specimen Description ABSCESS RIGHT HIP  Final   Special Requests NONE  Final   Gram Stain   Final    FEW WBC PRESENT, PREDOMINANTLY PMN NO SQUAMOUS EPITHELIAL CELLS SEEN FEW GRAM POSITIVE COCCI IN PAIRS IN CLUSTERS Performed at [facility]    Culture   Final    NO ANAEROBES ISOLATED Performed at [facility]    Report Status 11/29/2014 FINAL  Final  Culture, routine-abscess     Status: None   Collection Time: 11/25/14  4:54 PM  Result Value Ref Range Status   Specimen Description ABSCESS RIGHT HIP  Final   Special Requests NONE  Final   Gram Stain   Final    FEW WBC PRESENT,BOTH PMN AND MONONUCLEAR NO SQUAMOUS EPITHELIAL CELLS SEEN FEW GRAM POSITIVE COCCI IN PAIRS IN CLUSTERS Performed at [facility]    Culture   Final    ABUNDANT STAPHYLOCOCCUS AUREUS Note: RIFAMPIN AND GENTAMICIN SHOULD NOT BE USED AS SINGLE DRUGS FOR TREATMENT OF STAPH INFECTIONS. Performed at [facility]    Report Status 11/28/2014 FINAL  Final   Organism ID, Bacteria STAPHYLOCOCCUS AUREUS  Final      Susceptibility   Staphylococcus aureus - MIC*    CLINDAMYCIN <=0.25 SENSITIVE Sensitive     ERYTHROMYCIN <=0.25 SENSITIVE Sensitive     GENTAMICIN <=0.5 SENSITIVE Sensitive     LEVOFLOXACIN 0.25 SENSITIVE Sensitive     OXACILLIN 0.5 SENSITIVE Sensitive     RIFAMPIN <=0.5  SENSITIVE Sensitive     TRIMETH/SULFA <=10 SENSITIVE Sensitive     VANCOMYCIN 1 SENSITIVE Sensitive     TETRACYCLINE <=1 SENSITIVE Sensitive     MOXIFLOXACIN <=0.25 SENSITIVE Sensitive     * ABUNDANT STAPHYLOCOCCUS AUREUS  Tissue culture     Status: None   Collection Time: 11/25/14  5:43 PM  Result Value Ref Range Status   Specimen Description TISSUE RIGHT HIP  Final   Special Requests NONE  Final    Gram Stain   Final    MODERATE WBC PRESENT,BOTH PMN AND MONONUCLEAR ABUNDANT GRAM POSITIVE COCCI IN PAIRS Gram Stain Report Called to,Read Back By and Verified With: Gram Stain Report Called to,Read Back By and Verified With: Y.Rimando RN on 11/26/14 at 02:58 by Rise Mu Performed at [facility]    Culture   Final    ABUNDANT STAPHYLOCOCCUS AUREUS Note: RIFAMPIN AND GENTAMICIN SHOULD NOT BE USED AS SINGLE DRUGS FOR TREATMENT OF STAPH INFECTIONS. Performed at [facility]    Report Status 11/28/2014 FINAL  Final   Organism ID, Bacteria STAPHYLOCOCCUS AUREUS  Final      Susceptibility   Staphylococcus aureus - MIC*    CLINDAMYCIN <=0.25 SENSITIVE Sensitive     ERYTHROMYCIN <=0.25 SENSITIVE Sensitive     GENTAMICIN <=0.5 SENSITIVE Sensitive     LEVOFLOXACIN 0.25 SENSITIVE Sensitive     OXACILLIN 0.5 SENSITIVE Sensitive     RIFAMPIN <=0.5 SENSITIVE Sensitive     TRIMETH/SULFA <=10 SENSITIVE Sensitive     VANCOMYCIN <=0.5 SENSITIVE Sensitive     TETRACYCLINE <=1 SENSITIVE Sensitive     MOXIFLOXACIN <=0.25 SENSITIVE Sensitive     * ABUNDANT STAPHYLOCOCCUS AUREUS     Studies: No results found.  Scheduled Meds: . sodium chloride   Intravenous Once  . acidophilus  1 capsule Oral Daily  . aspirin EC  325 mg Oral BID PC  . atenolol  50 mg Oral BID  . carbamazepine  300 mg Oral BID  .  ceFAZolin (ANCEF) IV  2 g Intravenous 3 times per day  . escitalopram  20 mg Oral Daily  . furosemide  20 mg Oral Daily  . mirtazapine  22.5 mg Oral QHS  . ondansetron (ZOFRAN) IV  4 mg Intravenous TID AC  . potassium chloride  20 mEq Oral Daily  . rifampin  300 mg Oral Q12H  . traZODone  50 mg Oral QHS  . vancomycin  125 mg Oral QID    Principal Problem:   Acute pain of right hip; hip infection Active Problems:   Right hip pain   Hyponatremia   BP (high blood pressure)   Prosthetic hip infection   Staphylococcus aureus infection   Enteritis due to Clostridium difficile    Screen for STD (sexually transmitted disease)   Septic arthritis    Time spent: 30    If 7PM-7AM, please contact night-coverage at [email or web address], password [email or web address] 11/29/2014, 1:18 PM  LOS: 5 days      [facility]

## 2014-11-29 NOTE — Progress Notes (Signed)
Spoke with patient, she would rather go to a SNF for her IV antibiotics and therapy than home with home health. Making referral to [facility]. Will notify [facility] and continue to follow.

## 2014-11-29 NOTE — Op Note (Signed)
NAMEJOLISSA, KAPRAL NO.:  [id number]  MEDICAL RECORD NO.:  [id number]  LOCATION:  5N25C                        FACILITY:  [facility]  PHYSICIAN:  Lind Guest. Ninfa Linden, M.D.DATE OF BIRTH:  15-Jun-1955  DATE OF PROCEDURE:  11/28/2014 DATE OF DISCHARGE:                              OPERATIVE REPORT   PREOPERATIVE DIAGNOSIS:  Infected pseudotumor and prosthesis, right hip/proximal femur, status post irrigation and debridement x1.  POSTOPERATIVE DIAGNOSIS:  Infected pseudotumor and prosthesis, right hip/proximal femur, status post irrigation and debridement x1.  PROCEDURE:  Repeat irrigation and debridement of right hip.  SURGEON:  Lind Guest. Ninfa Linden, M.D.  ASSISTANT:  OR staff.  BLOOD LOSS:  Less than 300 mL.  COMPLICATIONS:  None.  INDICATIONS:  Ms. Osland is a 59 year old with a known infection involving her right hip prosthesis.  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  She then had an acute infection in her leg and this likely seated her hip, it is now grown out a sensitive Staph infection.  Infectious Disease is onboard and we felt that although her white blood cell count was normalized, her infectious parameters of her CRP and sed rate are still very high and she still had some pinkness around her skin incision, some pain and then, it was better to do a second irrigation and debridement.  PROCEDURE DESCRIPTION:  After informed consent was obtained, appropriate right hip was marked, she was brought to the operating room, placed supine on the operating table where general anesthesia was then obtained.  I turned her to lateral decubitus position and put hip positioners in the front and the back on her hip and axillary roll as well.  We prepped the right hip with DuraPrep and sterile drapes.  Time- out was called and she was identified as correct patient and correct right hip.  I then removed all of her  staples and then dissected down sharply with a knife down to the IT band.  I did find then gross purulence this time proximal with IP band, but was likely from opening in the IT band.  I opened up the IT band completely and did find necrotic adipose tissue and what I felt was still a gross infection. Not as much as the last time, but definitely there.  I then irrigated the soft tissue deep and superficial with 9 liters of normal saline solution switching out all instrumentation halfway through including switching out Pulsavac.  I removed all previous antibiotic beads as well.  I used a #10 blade to excise minimal skin, but mainly necrotic adipose tissue.  No muscle was removed, the muscle was in good shape. After thorough cleaning, I saw no more gross purulence.  I believe the femoral component is still intact and nice and tight, but the ASR components were certainly loose.  I then placed a medium Hemovac deep around the arthrotomy and closed the tissue deeply from #1 Ethibond suture to 0 Vicryl, 2-0 Vicryl, and staples on the skin.  Xeroform and well-padded sterile dressings were applied.  She was rolled back into a supine position, awakened, extubated, and taken to the recovery room  in stable condition.  All final counts were correct. There were no complications noted.     Lind Guest. Ninfa Linden, M.D.     CYB/MEDQ  D:  11/28/2014  T:  11/29/2014  Job:  [id number]

## 2014-11-30 LAB — TYPE AND SCREEN
ABO/RH(D): O NEG
ANTIBODY SCREEN: POSITIVE
DAT, IGG: NEGATIVE
Donor AG Type: NEGATIVE
Donor AG Type: NEGATIVE
UNIT DIVISION: 0
Unit division: 0

## 2014-11-30 LAB — COMPREHENSIVE METABOLIC PANEL
ALK PHOS: 77 U/L (ref 38–126)
ALT: 14 U/L (ref 14–54)
AST: 29 U/L (ref 15–41)
Albumin: 2.2 g/dL — ABNORMAL LOW (ref 3.5–5.0)
Anion gap: 6 (ref 5–15)
BILIRUBIN TOTAL: 1.3 mg/dL — AB (ref 0.3–1.2)
BUN: 6 mg/dL (ref 6–20)
CO2: 29 mmol/L (ref 22–32)
Calcium: 8 mg/dL — ABNORMAL LOW (ref 8.9–10.3)
Chloride: 96 mmol/L — ABNORMAL LOW (ref 101–111)
Creatinine, Ser: 0.72 mg/dL (ref 0.44–1.00)
GLUCOSE: 147 mg/dL — AB (ref 65–99)
Potassium: 3.7 mmol/L (ref 3.5–5.1)
SODIUM: 131 mmol/L — AB (ref 135–145)
TOTAL PROTEIN: 5.5 g/dL — AB (ref 6.5–8.1)

## 2014-11-30 LAB — CBC
HEMATOCRIT: 28.4 % — AB (ref 36.0–46.0)
Hemoglobin: 9.6 g/dL — ABNORMAL LOW (ref 12.0–15.0)
MCH: 27.7 pg (ref 26.0–34.0)
MCHC: 33.8 g/dL (ref 30.0–36.0)
MCV: 82.1 fL (ref 78.0–100.0)
Platelets: 215 10*3/uL (ref 150–400)
RBC: 3.46 MIL/uL — ABNORMAL LOW (ref 3.87–5.11)
RDW: 14.5 % (ref 11.5–15.5)
WBC: 7.3 10*3/uL (ref 4.0–10.5)

## 2014-11-30 NOTE — Clinical Social Work Note (Signed)
Clinical Social Work Assessment  Patient Details  Name: Christina French MRN: [id number] Date of Birth: Nov 14, 1955  Date of referral:  11/30/14               Reason for consult:  Facility Placement                Permission sought to share information with:  Chartered certified accountant granted to share information::  Yes, Verbal Permission Granted  Name::        Agency::  [facility]  Relationship::     Contact Information:     Housing/Transportation Living arrangements for the past 2 months:  [place] of Information:  Patient Patient Interpreter Needed:  None Criminal Activity/Legal Involvement Pertinent to Current Situation/Hospitalization:  No - Comment as needed Significant Relationships:  Adult Children (daughter) Lives with:    Do you feel safe going back to the place where you live?  No (IV abx, intermittent supervision and ambulates with walker) Need for family participation in patient care:  No (Coment)  Care giving concerns:  Daughter is unsure if she is able to provide the level of care needed at time of discharge, though the original plan was for patient to go home with home health and assistance provided by daughter for IV abx   Social Worker assessment / plan:  CSW assessed patient at bedside.  Patient was AOx4.  Patient states that her daughter, [name] is assisting her in decision making in regards to SNF placement.  CSW explained SNF process and patient is agreeable to SNF search of both [facility] and [facility].  Patient will be discharged with IV abx and PO abx due to double infection (cdiff).  Patient will need a private room based on isolation precautions.  CSW contacted facilities requested.  Once bed offers received, will provide.   Employment status:  Disabled (Comment on whether or not currently receiving Disability) Insurance information:    PT Recommendations:  Home with [place], 24 Hour  Supervision Information / Referral to community resources:  [place]  Patient/Family's Response to care agreeable  Patient/Family's Understanding of and Emotional Response to Diagnosis, Current Treatment, and Prognosis:  Remains realistic regarding prognosis and STR goals  Emotional Assessment Appearance:  Appears stated age Attitude/Demeanor/Rapport:   (appropriate) Affect (typically observed):  Accepting, Adaptable Orientation:  Oriented to Self, Oriented to Place, Oriented to  Time, Oriented to Situation Alcohol / Substance use:  Not Applicable Psych involvement (Current and /or in the community):  No (Comment)  Discharge Needs  Concerns to be addressed:  No discharge needs identified Readmission within the last 30 days:    Current discharge risk:  None Barriers to Discharge:  No Barriers Identified   Dulcy Fanny, LCSW 11/30/2014, 12:32 PM

## 2014-11-30 NOTE — Progress Notes (Signed)
[facility] for Infectious Disease    Subjective: Stools full formed now   Antibiotics:  Anti-infectives    Start     Dose/Rate Route Frequency Ordered Stop   11/27/14 2200  ceFAZolin (ANCEF) IVPB 2 g/50 mL premix     2 g 100 mL/hr over 30 Minutes Intravenous 3 times per day 11/27/14 2118     11/27/14 1000  rifampin (RIFADIN) capsule 300 mg     300 mg Oral Every 12 hours 11/26/14 2046     11/26/14 1200  vancomycin (VANCOCIN) 1,250 mg in sodium chloride 0.9 % 250 mL IVPB  Status:  Discontinued     1,250 mg 166.7 mL/hr over 90 Minutes Intravenous Every 12 hours 11/26/14 0017 11/27/14 2104   11/26/14 0030  vancomycin (VANCOCIN) 1,250 mg in sodium chloride 0.9 % 250 mL IVPB     1,250 mg 166.7 mL/hr over 90 Minutes Intravenous NOW 11/26/14 0017 11/26/14 0315   11/25/14 1756  vancomycin (VANCOCIN) powder  Status:  Discontinued       As needed 11/25/14 1756 11/25/14 1814   11/25/14 1754  gentamicin (GARAMYCIN) injection  Status:  Discontinued       As needed 11/25/14 1755 11/25/14 1814   11/25/14 1700  vancomycin (VANCOCIN) IVPB 1000 mg/200 mL premix  Status:  Discontinued     1,000 mg 200 mL/hr over 60 Minutes Intravenous Every 12 hours 11/25/14 0916 11/25/14 1113   11/25/14 1500  vancomycin (VANCOCIN) 50 mg/mL oral solution 125 mg     125 mg Oral 4 times daily 11/25/14 1415 12/09/14 1359   11/24/14 1200  piperacillin-tazobactam (ZOSYN) IVPB 3.375 g  Status:  Discontinued     3.375 g 12.5 mL/hr over 240 Minutes Intravenous 3 times per day 11/24/14 0210 11/25/14 1113   11/24/14 0700  vancomycin (VANCOCIN) IVPB 750 mg/150 ml premix  Status:  Discontinued     750 mg 150 mL/hr over 60 Minutes Intravenous Every 12 hours 11/24/14 0210 11/25/14 0916   11/24/14 0300  piperacillin-tazobactam (ZOSYN) IVPB 3.375 g     3.375 g 100 mL/hr over 30 Minutes Intravenous  Once 11/24/14 0210 11/24/14 0858      Medications: Scheduled Meds: . aspirin EC  325 mg Oral BID PC  .  atenolol  50 mg Oral BID  . carbamazepine  300 mg Oral BID  .  ceFAZolin (ANCEF) IV  2 g Intravenous 3 times per day  . escitalopram  20 mg Oral Daily  . furosemide  20 mg Oral Daily  . mirtazapine  22.5 mg Oral QHS  . ondansetron (ZOFRAN) IV  4 mg Intravenous TID AC  . rifampin  300 mg Oral Q12H  . traZODone  50 mg Oral QHS  . vancomycin  125 mg Oral QID   Continuous Infusions: . lactated ringers 10 mL/hr at 11/30/14 0131   PRN Meds:.acetaminophen, ALPRAZolam, diphenhydrAMINE, famotidine, HYDROmorphone (DILAUDID) injection, methocarbamol, oxyCODONE, promethazine, sodium chloride    Objective: Weight change:   Intake/Output Summary (Last 24 hours) at 11/30/14 1941 Last data filed at 11/30/14 0700  Gross per 24 hour  Intake  751.5 ml  Output      0 ml  Net  751.5 ml   Blood pressure 147/73, pulse 85, temperature 99.2 F (37.3 C), temperature source Oral, resp. rate 16, weight 200 lb (90.719 kg), SpO2 94 %. Temp:  [98.4 F (36.9 C)-99.2 F (37.3 C)] 99.2 F (37.3 C) (08/05 1334) Pulse Rate:  [85-90]  85 (08/05 1334) Resp:  [15-17] 16 (08/05 1334) BP: (137-156)/(65-86) 147/73 mmHg (08/05 1334) SpO2:  [94 %-100 %] 94 % (08/05 1334)  Physical Exam: General: Alert and awake, oriented x3, .lying in bed HEENT: anicteric sclera,EOMI CVS regular rate, normal r,  no murmur rubs or gallops GI: nondistended Skin: inciscion not examined today Neuro: nonfocal  CBC:  CBC Latest Ref Rng 11/30/2014 11/29/2014 11/28/2014  WBC 4.0 - 10.5 K/uL 7.3 6.4 -  Hemoglobin 12.0 - 15.0 g/dL 9.6(L) 7.6(L) 8.2(L)  Hematocrit 36.0 - 46.0 % 28.4(L) 22.9(L) 24.0(L)  Platelets 150 - 400 K/uL 215 209 -       BMET  Recent Labs  11/29/14 0525 11/30/14 0540  NA 130* 131*  K 2.9* 3.7  CL 92* 96*  CO2 29 29  GLUCOSE 137* 147*  BUN 8 6  CREATININE 0.90 0.72  CALCIUM 8.3* 8.0*     Liver Panel   Recent Labs  11/29/14 0525 11/30/14 0540  PROT 5.6* 5.5*  ALBUMIN 2.1* 2.2*  AST 19 29    ALT 17 14  ALKPHOS 83 77  BILITOT 1.1 1.3*       Sedimentation Rate No results for input(s): ESRSEDRATE in the last 72 hours. C-Reactive Protein No results for input(s): CRP in the last 72 hours.  Micro Results: Recent Results (from the past 720 hour(s))  Blood culture (routine x 2)     Status: None   Collection Time: 11/23/14  1:32 PM  Result Value Ref Range Status   Specimen Description BLOOD RIGHT HAND  Final   Special Requests BOTTLES DRAWN AEROBIC AND ANAEROBIC  1CC  Final   Culture NO GROWTH 5 DAYS  Final   Report Status 11/28/2014 FINAL  Final  Blood culture (routine x 2)     Status: None   Collection Time: 11/23/14  2:14 PM  Result Value Ref Range Status   Specimen Description BLOOD LEFT ASSIST CONTROL  Final   Special Requests   Final    BOTTLES DRAWN AEROBIC AND ANAEROBIC  AER [place] ANA [place]   Culture NO GROWTH 5 DAYS  Final   Report Status 11/28/2014 FINAL  Final  Culture, blood (x 2)     Status: None   Collection Time: 11/24/14  2:55 PM  Result Value Ref Range Status   Specimen Description BLOOD LEFT ARM  Final   Special Requests IN PEDIATRIC BOTTLE 3CC  Final   Culture NO GROWTH 5 DAYS  Final   Report Status 11/29/2014 FINAL  Final  Culture, blood (x 2)     Status: None   Collection Time: 11/24/14  3:13 PM  Result Value Ref Range Status   Specimen Description BLOOD RIGHT ANTECUBITAL  Final   Special Requests BOTTLES DRAWN AEROBIC ONLY 5CC  Final   Culture NO GROWTH 5 DAYS  Final   Report Status 11/29/2014 FINAL  Final  Urine culture     Status: None   Collection Time: 11/24/14  4:00 PM  Result Value Ref Range Status   Specimen Description URINE, RANDOM  Final   Special Requests ADDED 2213  Final   Culture NO GROWTH 2 DAYS  Final   Report Status 11/26/2014 FINAL  Final  Stool culture     Status: None   Collection Time: 11/24/14 10:16 PM  Result Value Ref Range Status   Specimen Description STOOL  Final   Special Requests NONE  Final   Culture   Final     NO SALMONELLA, SHIGELLA, CAMPYLOBACTER,  YERSINIA, OR E.COLI 0157:H7 ISOLATED Performed at [facility]    Report Status 11/29/2014 FINAL  Final  Clostridium Difficile by PCR (not at [facility])     Status: Abnormal   Collection Time: 11/24/14 10:17 PM  Result Value Ref Range Status   Toxigenic C Difficile by pcr POSITIVE (A) NEGATIVE Final    Comment: CRITICAL RESULT CALLED TO, READ BACK BY AND VERIFIED WITH: Leonia Corona, RN AT 1401 11/25/14 BY A DAVIS   Surgical pcr screen     Status: None   Collection Time: 11/25/14  3:45 PM  Result Value Ref Range Status   MRSA, PCR NEGATIVE NEGATIVE Final   Staphylococcus aureus NEGATIVE NEGATIVE Final    Comment:        The Xpert SA Assay (FDA approved for NASAL specimens in patients over 25 years of age), is one component of a comprehensive surveillance program.  Test performance has been validated by [facility] for patients greater than or equal to 42 year old. It is not intended to diagnose infection nor to guide or monitor treatment.   Body fluid culture     Status: None   Collection Time: 11/25/14  4:51 PM  Result Value Ref Range Status   Specimen Description SYNOVIAL RIGHT HIP  Final   Special Requests SYNOVIAL RIGHT HIP  Final   Gram Stain   Final    ABUNDANT WBC PRESENT,BOTH PMN AND MONONUCLEAR FEW GRAM POSITIVE COCCI IN CLUSTERS IN CHAINS CRITICAL RESULT CALLED TO, READ BACK BY AND VERIFIED WITH: RIMANDO,D RN 11/25/14 2100 [place] CONFIRMED BY V WILIKINS    Culture ABUNDANT STAPHYLOCOCCUS AUREUS  Final   Report Status 11/29/2014 FINAL  Final   Organism ID, Bacteria STAPHYLOCOCCUS AUREUS  Final      Susceptibility   Staphylococcus aureus - MIC*    CIPROFLOXACIN <=0.5 SENSITIVE Sensitive     ERYTHROMYCIN <=0.25 SENSITIVE Sensitive     GENTAMICIN <=0.5 SENSITIVE Sensitive     OXACILLIN 0.5 SENSITIVE Sensitive     TETRACYCLINE <=1 SENSITIVE Sensitive     VANCOMYCIN <=0.5 SENSITIVE Sensitive     TRIMETH/SULFA <=10  SENSITIVE Sensitive     CLINDAMYCIN <=0.25 SENSITIVE Sensitive     RIFAMPIN <=0.5 SENSITIVE Sensitive     Inducible Clindamycin NEGATIVE Sensitive     * ABUNDANT STAPHYLOCOCCUS AUREUS  Anaerobic culture     Status: None   Collection Time: 11/25/14  4:54 PM  Result Value Ref Range Status   Specimen Description ABSCESS RIGHT HIP  Final   Special Requests NONE  Final   Gram Stain   Final    FEW WBC PRESENT, PREDOMINANTLY PMN NO SQUAMOUS EPITHELIAL CELLS SEEN FEW GRAM POSITIVE COCCI IN PAIRS IN CLUSTERS Performed at [facility]    Culture   Final    NO ANAEROBES ISOLATED Performed at [facility]    Report Status 11/29/2014 FINAL  Final  Culture, routine-abscess     Status: None   Collection Time: 11/25/14  4:54 PM  Result Value Ref Range Status   Specimen Description ABSCESS RIGHT HIP  Final   Special Requests NONE  Final   Gram Stain   Final    FEW WBC PRESENT,BOTH PMN AND MONONUCLEAR NO SQUAMOUS EPITHELIAL CELLS SEEN FEW GRAM POSITIVE COCCI IN PAIRS IN CLUSTERS Performed at [facility]    Culture   Final    ABUNDANT STAPHYLOCOCCUS AUREUS Note: RIFAMPIN AND GENTAMICIN SHOULD NOT BE USED AS SINGLE DRUGS FOR TREATMENT OF STAPH INFECTIONS. Performed  at [facility]    Report Status 11/28/2014 FINAL  Final   Organism ID, Bacteria STAPHYLOCOCCUS AUREUS  Final      Susceptibility   Staphylococcus aureus - MIC*    CLINDAMYCIN <=0.25 SENSITIVE Sensitive     ERYTHROMYCIN <=0.25 SENSITIVE Sensitive     GENTAMICIN <=0.5 SENSITIVE Sensitive     LEVOFLOXACIN 0.25 SENSITIVE Sensitive     OXACILLIN 0.5 SENSITIVE Sensitive     RIFAMPIN <=0.5 SENSITIVE Sensitive     TRIMETH/SULFA <=10 SENSITIVE Sensitive     VANCOMYCIN 1 SENSITIVE Sensitive     TETRACYCLINE <=1 SENSITIVE Sensitive     MOXIFLOXACIN <=0.25 SENSITIVE Sensitive     * ABUNDANT STAPHYLOCOCCUS AUREUS  Tissue culture     Status: None   Collection Time: 11/25/14  5:43 PM  Result Value Ref  Range Status   Specimen Description TISSUE RIGHT HIP  Final   Special Requests NONE  Final   Gram Stain   Final    MODERATE WBC PRESENT,BOTH PMN AND MONONUCLEAR ABUNDANT GRAM POSITIVE COCCI IN PAIRS Gram Stain Report Called to,Read Back By and Verified With: Gram Stain Report Called to,Read Back By and Verified With: Y.Rimando RN on 11/26/14 at 02:58 by Rise Mu Performed at [facility]    Culture   Final    ABUNDANT STAPHYLOCOCCUS AUREUS Note: RIFAMPIN AND GENTAMICIN SHOULD NOT BE USED AS SINGLE DRUGS FOR TREATMENT OF STAPH INFECTIONS. Performed at [facility]    Report Status 11/28/2014 FINAL  Final   Organism ID, Bacteria STAPHYLOCOCCUS AUREUS  Final      Susceptibility   Staphylococcus aureus - MIC*    CLINDAMYCIN <=0.25 SENSITIVE Sensitive     ERYTHROMYCIN <=0.25 SENSITIVE Sensitive     GENTAMICIN <=0.5 SENSITIVE Sensitive     LEVOFLOXACIN 0.25 SENSITIVE Sensitive     OXACILLIN 0.5 SENSITIVE Sensitive     RIFAMPIN <=0.5 SENSITIVE Sensitive     TRIMETH/SULFA <=10 SENSITIVE Sensitive     VANCOMYCIN <=0.5 SENSITIVE Sensitive     TETRACYCLINE <=1 SENSITIVE Sensitive     MOXIFLOXACIN <=0.25 SENSITIVE Sensitive     * ABUNDANT STAPHYLOCOCCUS AUREUS    Studies/Results: No results found.    Assessment/Plan:  Principal Problem:   Acute pain of right hip; hip infection Active Problems:   Right hip pain   Hyponatremia   BP (high blood pressure)   Prosthetic hip infection   Staphylococcus aureus infection   Enteritis due to Clostridium difficile   Screen for STD (sexually transmitted disease)   Septic arthritis    Christina French is a 59 y.o. 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 Woud swap PPI for pepcid if on this med.  No role for probiotics.   I will sign off for now.  I suggested option of  her following up with Dr. Adrian Prows who is ID MD [facility] [place] and works in [facility] where pt has PCP since pt lives in [place].  If pt cannot be seen by Dr. Ola Spurr for followup we can arrange [facility] with [place]  Please call with further questions.   LOS: 6 days   Alcide Evener 11/30/2014, 7:41 PM

## 2014-11-30 NOTE — Care Management Note (Signed)
Case Management Note  Patient Details  Name: Christina French MRN: [id number] Date of Birth: 02/20/1956  Subjective/Objective:        Right hip infection            Action/Plan: Spoke with patient about d/c plan, she feels that since second surgery and with continued nausea she does want to go to SNF. Referral made to CSW, Pam with [facility] notified of change in d/c plan. Will continue to follow.   Expected Discharge Date:                  Expected Discharge Plan:  Skilled Nursing Facility  In-House Referral:  Clinical Social Work  Discharge planning Services  CM Consult  Post Acute Care Choice:    Choice offered to:  Patient  DME Arranged:    DME Agency:     HH Arranged:    [place] Agency:     Status of Service:  In process, will continue to follow  Medicare Important Message Given:    Date Medicare IM Given:    Medicare IM give by:    Date Additional Medicare IM Given:    Additional Medicare Important Message give by:     If discussed at [place] of Stay Meetings, dates discussed:    Additional Comments:  Nila Nephew, RN 11/30/2014, 2:41 PM

## 2014-11-30 NOTE — Progress Notes (Signed)
Physical Therapy Treatment Patient Details Name: LAVINE HARGROVE MRN: [id number] DOB: 01/07/1956 Today's Date: 11/30/2014    History of Present Illness 59 yo female with pain R hip now having received surgical wash of R hip and will later have revision due to metal on metal degrading    PT Comments    Pt stated she had symptoms of nausea and felt light headed. Pt opted to limit ambulation distance and return to chair. Pt agreed to do exercises but opted to limit those after start because she wasn't feeling well. Pt is now planning on going to SNF for ongoing rehab to increase functional independence and safety. Patient does not have adequate assistance to return home and has not progressed as expected with therapies. Per MD note he spoke with patient this morning and she was agreeable to SNF.  Will update POC accordingly  Follow Up Recommendations  SNF     Equipment Recommendations  None recommended by PT    Recommendations for Other Services       Precautions / Restrictions Precautions Precautions: Fall Restrictions Weight Bearing Restrictions: Yes LLE Weight Bearing: Weight bearing as tolerated    Mobility  Bed Mobility               General bed mobility comments: Pt in bathroom upon arrival into room. Returned to chair.  Transfers Overall transfer level: Needs assistance Equipment used: Rolling walker (2 wheeled) Transfers: Sit to/from Stand Sit to Stand: Min guard         General transfer comment: Cues for hand placement and to ensure that back of pt's legs were up against chair before sitting. Min G for safety.  Ambulation/Gait Ambulation/Gait assistance: Min guard Ambulation Distance (Feet): 20 Feet Assistive device: Rolling walker (2 wheeled) Gait Pattern/deviations: Step-through pattern;Decreased step length - left;Decreased stance time - right;Decreased weight shift to right   Gait velocity interpretation: Below normal speed for age/gender General Gait  Details: Slow gait as pt was feeling nauseated. Overall gait was steady. Min G for safety.   Stairs            Wheelchair Mobility    Modified Rankin (Stroke Patients Only)       Balance                                    Cognition Arousal/Alertness: Awake/alert Behavior During Therapy: WFL for tasks assessed/performed Overall Cognitive Status: Within Functional Limits for tasks assessed                      Exercises Total Joint Exercises Hip ABduction/ADduction: AAROM;Right;5 reps;Seated    General Comments        Pertinent Vitals/Pain Pain Assessment: Faces Faces Pain Scale: Hurts little more Pain Location: R hip Pain Descriptors / Indicators: Aching;Other (Comment) (Pt felt nauseated and light headed.) Pain Intervention(s): Limited activity within patient's tolerance;Monitored during session;Repositioned    Home Living                      Prior Function            PT Goals (current goals can now be found in the care plan section) Progress towards PT goals: Progressing toward goals    Frequency  Min 3X/week    PT Plan Discharge plan needs to be updated    Co-evaluation  End of Session Equipment Utilized During Treatment: Gait belt Activity Tolerance: Patient limited by pain;Other (comment) Patient left: in chair;with call bell/phone within reach     Time: 1021-1037 PT Time Calculation (min) (ACUTE ONLY): 16 min  Charges:                       G CodesAllyn Kenner, SPTA December 15, 2014, 10:47 AM

## 2014-11-30 NOTE — Clinical Social Work Placement (Signed)
   CLINICAL SOCIAL WORK PLACEMENT  NOTE  Date:  11/30/2014  Patient Details  Name: Christina French MRN: [id number] Date of Birth: 04/14/1956  Clinical Social Work is seeking post-discharge placement for this patient at the [place] level of care (*CSW will initial, date and re-position this form in  chart as items are completed):  Yes   Patient/family provided with [facility] Work Department's list of facilities offering this level of care within the geographic area requested by the patient (or if unable, by the patient's family).  Yes   Patient/family informed of their freedom to choose among providers that offer the needed level of care, that participate in Medicare, Medicaid or managed care program needed by the patient, have an available bed and are willing to accept the patient.  Yes   Patient/family informed of [facility]'s ownership interest in [facility] and [facility], as well as of the fact that they are under no obligation to receive care at these facilities.  PASRR submitted to EDS on 11/30/14     PASRR number received on 11/30/14     Existing PASRR number confirmed on       FL2 transmitted to all facilities in geographic area requested by pt/family on 11/30/14     FL2 transmitted to all facilities within larger geographic area on       Patient informed that his/her managed care company has contracts with or will negotiate with certain facilities, including the following:            Patient/family informed of bed offers received.  Patient chooses bed at       Physician recommends and patient chooses bed at      Patient to be transferred to   on  .  Patient to be transferred to facility by       Patient family notified on   of transfer.  Name of family member notified:        PHYSICIAN       Additional Comment:    _______________________________________________ Dulcy Fanny, LCSW 11/30/2014, 12:35 PM

## 2014-11-30 NOTE — Care Management Important Message (Signed)
Important Message  Patient Details  Name: Christina French MRN: [id number] Date of Birth: [birth year]   Medicare Important Message Given:   Fuller Plan RN    Nila Nephew, RN 11/30/2014, 2:38 PM

## 2014-11-30 NOTE — Progress Notes (Signed)
Patient ID: Christina French, female   DOB: [birth year], 59 y.o.   MRN: [id number] Looks much better overall and reports feeling better.  Her right hip dressing is changed and the redness has completely resolved around her right hip incision.  ID is following.  She will need at least 6 weeks of IV antibiotics.  She does live alone and is requesting short-term SNF placement.  Consult put in for Social Work.  Likely can discharge to SNF by Monday.  Continue PT.

## 2014-11-30 NOTE — Progress Notes (Addendum)
[facility] FOLLOW UP CONSULT NOTE  Christina French ZCH:[id number] DOB: 08-19-1955 DOA: 11/23/2014 PCP: [name], MD  Subjective: Patient still reports having 2-3 loose stools a day, overall she is feeling better  Assessment/Plan:   Sepsis - Secondary to infected right hip periprosthetic collection and C. difficile colitis. Much improved post irrigation and debridement of the right hip, cultures demonstrate MSSA-ID following-currently on IV cefazolin/ rifampin.  She will need 8 weeks of IV ancef + rifampin.  Status post repeat Repeat I&D Right Hip (Right) 8/3   Acute blood loss anemia Hemoglobin drop from 8.7 to 7.6 Status post transfusion of 2 units,hemoglobin 9.6   C. difficile colitis-on by mouth vancomycin, treat for 14 days   Chronic Hyponatremia - Suspect SIADH, vs dehydration in the setting of C. difficile, continue low-dose Lasix , start patient on fluid restriction, slight improvement in sodium after being treated with normal saline, will DC IV fluids and watch sodium levels, probably back to baseline today Patient has had a low sodium for at least several years, baseline around 128-130   Hypokalemia - Likely due GI loss and Lasix.repleted, potassium 3.7 Recheck BMP in magnesium in the morning  Hypertension  - Controlled, continue atenolol  Seizure disorder - Controlled, continue with carbamazepine- Monitor sodium.   History of depression with anxiety - Continue with Xanax, Lexapro, Remeron and trazodone- Monitor sodium.  Hx of Malignant Melanoma in Situ-right leg - Followed at [facility]  Code Status: Full Family Communication: None at bedside Disposition Plan: Remain inpatient DVT Prophylaxis: Lovenox   Consultants:  Orthopedic  Infectious Disease  Procedures:  Irrigation and debridement of right hip 11/25/14  Antibiotics:  Vancomycin 11/24/14 >>  Zosyn 11/24/14 >>11/25/14  Rifampin 11/27/14 >>  Objective: Filed Vitals:   11/30/14 1029   BP: 156/86  Pulse: 88  Temp:   Resp:     Intake/Output Summary (Last 24 hours) at 11/30/14 1331 Last data filed at 11/30/14 0700  Gross per 24 hour  Intake 1326.5 ml  Output      0 ml  Net 1326.5 ml   Filed Weights   11/24/14 0018  Weight: 90.719 kg (200 lb)    Exam:   General:  Patient is seated in recliner no acute distress, she is alert and oriented x 3 with clear speech   Cardiovascular: S1 S2 RRR, no m/r/g, 2+ DP pulses, 1+ LE edema bilaterally   Respiratory: Clear to auscultation, no w/r/r, normal respiratory effort  Abdomen: Bowel sounds present, soft, non-distended, TTP LLQ, no guarding or peritoneal signs  Musculoskeletal: grossly normal tone, 5/5 strength BLE  Skin: Mild erythema surrounding incision site on right hip, no rashes  Data Reviewed: Basic Metabolic Panel:  Recent Labs Lab 11/25/14 0436 11/26/14 0444 11/27/14 0505 11/28/14 1703 11/29/14 0525 11/30/14 0540  NA 125* 125* 126* 131* 130* 131*  K 3.6 3.3* 2.9* 3.1* 2.9* 3.7  CL 94* 95* 90*  --  92* 96*  CO2 20* 24 27  --  29 29  GLUCOSE 164* 148* 139* 148* 137* 147*  BUN 8 8 8   --  8 6  CREATININE 0.86 0.86 0.89  --  0.90 0.72  CALCIUM 8.5* 8.2* 8.8*  --  8.3* 8.0*  MG  --   --   --   --  1.5*  --    Liver Function Tests:  Recent Labs Lab 11/24/14 1455 11/29/14 0525 11/30/14 0540  AST 26 19 29   ALT 17 17 14   ALKPHOS 62 83  77  BILITOT 0.9 1.1 1.3*  PROT 7.3 5.6* 5.5*  ALBUMIN 3.6 2.1* 2.2*   CBC:  Recent Labs Lab 11/24/14 1455  11/26/14 0444 11/27/14 0505 11/28/14 0527 11/28/14 1703 11/29/14 0525 11/30/14 0540  WBC 21.1*  < > 7.4 7.3 5.7  --  6.4 7.3  NEUTROABS 18.6*  --   --   --   --   --   --   --   HGB 11.5*  < > 8.2* 8.4* 8.7* 8.2* 7.6* 9.6*  HCT 34.0*  < > 23.5* 24.7* 25.6* 24.0* 22.9* 28.4*  MCV 83.1  < > 81.3 80.5 81.8  --  81.5 82.1  PLT 150  < > 120* 158 175  --  209 215  < > = values in this interval not displayed.  Recent Results (from the past 240  hour(s))  Blood culture (routine x 2)     Status: None   Collection Time: 11/23/14  1:32 PM  Result Value Ref Range Status   Specimen Description BLOOD RIGHT HAND  Final   Special Requests BOTTLES DRAWN AEROBIC AND ANAEROBIC  1CC  Final   Culture NO GROWTH 5 DAYS  Final   Report Status 11/28/2014 FINAL  Final  Blood culture (routine x 2)     Status: None   Collection Time: 11/23/14  2:14 PM  Result Value Ref Range Status   Specimen Description BLOOD LEFT ASSIST CONTROL  Final   Special Requests   Final    BOTTLES DRAWN AEROBIC AND ANAEROBIC  AER [place] ANA [place]   Culture NO GROWTH 5 DAYS  Final   Report Status 11/28/2014 FINAL  Final  Culture, blood (x 2)     Status: None   Collection Time: 11/24/14  2:55 PM  Result Value Ref Range Status   Specimen Description BLOOD LEFT ARM  Final   Special Requests IN PEDIATRIC BOTTLE 3CC  Final   Culture NO GROWTH 5 DAYS  Final   Report Status 11/29/2014 FINAL  Final  Culture, blood (x 2)     Status: None   Collection Time: 11/24/14  3:13 PM  Result Value Ref Range Status   Specimen Description BLOOD RIGHT ANTECUBITAL  Final   Special Requests BOTTLES DRAWN AEROBIC ONLY 5CC  Final   Culture NO GROWTH 5 DAYS  Final   Report Status 11/29/2014 FINAL  Final  Urine culture     Status: None   Collection Time: 11/24/14  4:00 PM  Result Value Ref Range Status   Specimen Description URINE, RANDOM  Final   Special Requests ADDED 2213  Final   Culture NO GROWTH 2 DAYS  Final   Report Status 11/26/2014 FINAL  Final  Stool culture     Status: None   Collection Time: 11/24/14 10:16 PM  Result Value Ref Range Status   Specimen Description STOOL  Final   Special Requests NONE  Final   Culture   Final    NO SALMONELLA, SHIGELLA, CAMPYLOBACTER, YERSINIA, OR E.COLI 0157:H7 ISOLATED Performed at [facility]    Report Status 11/29/2014 FINAL  Final  Clostridium Difficile by PCR (not at [facility])     Status: Abnormal   Collection Time: 11/24/14 10:17  PM  Result Value Ref Range Status   Toxigenic C Difficile by pcr POSITIVE (A) NEGATIVE Final    Comment: CRITICAL RESULT CALLED TO, READ BACK BY AND VERIFIED WITH: Leonia Corona, RN AT 1401 11/25/14 BY A DAVIS   Surgical pcr screen  Status: None   Collection Time: 11/25/14  3:45 PM  Result Value Ref Range Status   MRSA, PCR NEGATIVE NEGATIVE Final   Staphylococcus aureus NEGATIVE NEGATIVE Final    Comment:        The Xpert SA Assay (FDA approved for NASAL specimens in patients over 59 years of age), is one component of a comprehensive surveillance program.  Test performance has been validated by [facility] for patients greater than or equal to 64 year old. It is not intended to diagnose infection nor to guide or monitor treatment.   Body fluid culture     Status: None   Collection Time: 11/25/14  4:51 PM  Result Value Ref Range Status   Specimen Description SYNOVIAL RIGHT HIP  Final   Special Requests SYNOVIAL RIGHT HIP  Final   Gram Stain   Final    ABUNDANT WBC PRESENT,BOTH PMN AND MONONUCLEAR FEW GRAM POSITIVE COCCI IN CLUSTERS IN CHAINS CRITICAL RESULT CALLED TO, READ BACK BY AND VERIFIED WITH: RIMANDO,D RN 11/25/14 2[address] [place] CONFIRMED BY V WILIKINS    Culture ABUNDANT STAPHYLOCOCCUS AUREUS  Final   Report Status 11/29/2014 FINAL  Final   Organism ID, Bacteria STAPHYLOCOCCUS AUREUS  Final      Susceptibility   Staphylococcus aureus - MIC*    CIPROFLOXACIN <=0.5 SENSITIVE Sensitive     ERYTHROMYCIN <=0.25 SENSITIVE Sensitive     GENTAMICIN <=0.5 SENSITIVE Sensitive     OXACILLIN 0.5 SENSITIVE Sensitive     TETRACYCLINE <=1 SENSITIVE Sensitive     VANCOMYCIN <=0.5 SENSITIVE Sensitive     TRIMETH/SULFA <=10 SENSITIVE Sensitive     CLINDAMYCIN <=0.25 SENSITIVE Sensitive     RIFAMPIN <=0.5 SENSITIVE Sensitive     Inducible Clindamycin NEGATIVE Sensitive     * ABUNDANT STAPHYLOCOCCUS AUREUS  Anaerobic culture     Status: None   Collection Time: 11/25/14  4:54 PM   Result Value Ref Range Status   Specimen Description ABSCESS RIGHT HIP  Final   Special Requests NONE  Final   Gram Stain   Final    FEW WBC PRESENT, PREDOMINANTLY PMN NO SQUAMOUS EPITHELIAL CELLS SEEN FEW GRAM POSITIVE COCCI IN PAIRS IN CLUSTERS Performed at [facility]    Culture   Final    NO ANAEROBES ISOLATED Performed at [facility]    Report Status 11/29/2014 FINAL  Final  Culture, routine-abscess     Status: None   Collection Time: 11/25/14  4:54 PM  Result Value Ref Range Status   Specimen Description ABSCESS RIGHT HIP  Final   Special Requests NONE  Final   Gram Stain   Final    FEW WBC PRESENT,BOTH PMN AND MONONUCLEAR NO SQUAMOUS EPITHELIAL CELLS SEEN FEW GRAM POSITIVE COCCI IN PAIRS IN CLUSTERS Performed at [facility]    Culture   Final    ABUNDANT STAPHYLOCOCCUS AUREUS Note: RIFAMPIN AND GENTAMICIN SHOULD NOT BE USED AS SINGLE DRUGS FOR TREATMENT OF STAPH INFECTIONS. Performed at [facility]    Report Status 11/28/2014 FINAL  Final   Organism ID, Bacteria STAPHYLOCOCCUS AUREUS  Final      Susceptibility   Staphylococcus aureus - MIC*    CLINDAMYCIN <=0.25 SENSITIVE Sensitive     ERYTHROMYCIN <=0.25 SENSITIVE Sensitive     GENTAMICIN <=0.5 SENSITIVE Sensitive     LEVOFLOXACIN 0.25 SENSITIVE Sensitive     OXACILLIN 0.5 SENSITIVE Sensitive     RIFAMPIN <=0.5 SENSITIVE Sensitive     TRIMETH/SULFA <=10 SENSITIVE Sensitive  VANCOMYCIN 1 SENSITIVE Sensitive     TETRACYCLINE <=1 SENSITIVE Sensitive     MOXIFLOXACIN <=0.25 SENSITIVE Sensitive     * ABUNDANT STAPHYLOCOCCUS AUREUS  Tissue culture     Status: None   Collection Time: 11/25/14  5:43 PM  Result Value Ref Range Status   Specimen Description TISSUE RIGHT HIP  Final   Special Requests NONE  Final   Gram Stain   Final    MODERATE WBC PRESENT,BOTH PMN AND MONONUCLEAR ABUNDANT GRAM POSITIVE COCCI IN PAIRS Gram Stain Report Called to,Read Back By and Verified  With: Gram Stain Report Called to,Read Back By and Verified With: Y.Rimando RN on 11/26/14 at 02:58 by Rise Mu Performed at [facility]    Culture   Final    ABUNDANT STAPHYLOCOCCUS AUREUS Note: RIFAMPIN AND GENTAMICIN SHOULD NOT BE USED AS SINGLE DRUGS FOR TREATMENT OF STAPH INFECTIONS. Performed at [facility]    Report Status 11/28/2014 FINAL  Final   Organism ID, Bacteria STAPHYLOCOCCUS AUREUS  Final      Susceptibility   Staphylococcus aureus - MIC*    CLINDAMYCIN <=0.25 SENSITIVE Sensitive     ERYTHROMYCIN <=0.25 SENSITIVE Sensitive     GENTAMICIN <=0.5 SENSITIVE Sensitive     LEVOFLOXACIN 0.25 SENSITIVE Sensitive     OXACILLIN 0.5 SENSITIVE Sensitive     RIFAMPIN <=0.5 SENSITIVE Sensitive     TRIMETH/SULFA <=10 SENSITIVE Sensitive     VANCOMYCIN <=0.5 SENSITIVE Sensitive     TETRACYCLINE <=1 SENSITIVE Sensitive     MOXIFLOXACIN <=0.25 SENSITIVE Sensitive     * ABUNDANT STAPHYLOCOCCUS AUREUS     Studies: No results found.  Scheduled Meds: . aspirin EC  325 mg Oral BID PC  . atenolol  50 mg Oral BID  . carbamazepine  300 mg Oral BID  .  ceFAZolin (ANCEF) IV  2 g Intravenous 3 times per day  . escitalopram  20 mg Oral Daily  . furosemide  20 mg Oral Daily  . mirtazapine  22.5 mg Oral QHS  . ondansetron (ZOFRAN) IV  4 mg Intravenous TID AC  . potassium chloride  40 mEq Oral TID  . rifampin  300 mg Oral Q12H  . traZODone  50 mg Oral QHS  . vancomycin  125 mg Oral QID    Principal Problem:   Acute pain of right hip; hip infection Active Problems:   Right hip pain   Hyponatremia   BP (high blood pressure)   Prosthetic hip infection   Staphylococcus aureus infection   Enteritis due to Clostridium difficile   Screen for STD (sexually transmitted disease)   Septic arthritis    Time spent: 30    If 7PM-7AM, please contact night-coverage at [email or web address], password [facility] 11/30/2014, 1:31 PM  LOS: 6 days        [place]  [facility]

## 2014-12-01 LAB — COMPREHENSIVE METABOLIC PANEL
ALBUMIN: 2.3 g/dL — AB (ref 3.5–5.0)
ALT: 14 U/L (ref 14–54)
AST: 26 U/L (ref 15–41)
Alkaline Phosphatase: 76 U/L (ref 38–126)
Anion gap: 10 (ref 5–15)
BUN: <5 mg/dL — ABNORMAL LOW (ref 6–20)
CHLORIDE: 92 mmol/L — AB (ref 101–111)
CO2: 31 mmol/L (ref 22–32)
CREATININE: 0.76 mg/dL (ref 0.44–1.00)
Calcium: 8.6 mg/dL — ABNORMAL LOW (ref 8.9–10.3)
GFR calc Af Amer: >60 mL/min (ref >60)
GFR calc non Af Amer: >60 mL/min (ref >60)
GLUCOSE: 140 mg/dL — AB (ref 65–99)
POTASSIUM: 3.3 mmol/L — AB (ref 3.5–5.1)
SODIUM: 133 mmol/L — AB (ref 135–145)
Total Bilirubin: 0.7 mg/dL (ref 0.3–1.2)
Total Protein: 6 g/dL — ABNORMAL LOW (ref 6.5–8.1)

## 2014-12-01 LAB — MAGNESIUM: Magnesium: 1.9 mg/dL (ref 1.7–2.4)

## 2014-12-01 MED ORDER — POTASSIUM CHLORIDE CRYS ER 20 MEQ PO TBCR
20.0000 meq | EXTENDED_RELEASE_TABLET | Freq: Two times a day (BID) | ORAL | Status: AC
  Administered 2014-12-01 – 2014-12-02 (×4): 20 meq via ORAL
  Filled 2014-12-01 (×4): qty 1

## 2014-12-01 NOTE — Progress Notes (Signed)
Subjective: 3 Days Post-Op Procedure(s) (LRB): Repeat I&D Right Hip (Right) Patient reports pain as moderate.    Objective: Vital signs in last 24 hours: Temp:  [98.4 F (36.9 C)-100.7 F (38.2 C)] 98.8 F (37.1 C) (08/06 1412) Pulse Rate:  [76-95] 85 (08/06 1412) Resp:  [15-18] 15 (08/06 1412) BP: (156-159)/(81-88) 159/82 mmHg (08/06 1412) SpO2:  [94 %-98 %] 98 % (08/06 1412)  Intake/Output from previous day:   Intake/Output this shift: Total I/O In: -  Out: 300 [Urine:300]   Recent Labs  11/28/14 1703 11/29/14 0525 11/30/14 0540  HGB 8.2* 7.6* 9.6*    Recent Labs  11/29/14 0525 11/30/14 0540  WBC 6.4 7.3  RBC 2.81* 3.46*  HCT 22.9* 28.4*  PLT 209 215    Recent Labs  11/30/14 0540 12/01/14 0500  NA 131* 133*  K 3.7 3.3*  CL 96* 92*  CO2 29 31  BUN 6 <5*  CREATININE 0.72 0.76  GLUCOSE 147* 140*  CALCIUM 8.0* 8.6*   No results for input(s): LABPT, INR in the last 72 hours.  Neurologically intact  Assessment/Plan: 3 Days Post-Op Procedure(s) (LRB): Repeat I&D Right Hip (Right) Up with therapy   Pending SNF Nefi Musich C 12/01/2014, 5:01 PM

## 2014-12-01 NOTE — Progress Notes (Addendum)
[facility] FOLLOW UP CONSULT NOTE  Christina French UYQ:[id number] DOB: 1956/04/02 DOA: 11/23/2014 PCP: [name], MD  Subjective: Diarrhea seems to be improving, tolerating soft diet  Assessment/Plan:   Sepsis - Secondary to infected right hip periprosthetic collection and C. difficile colitis. Much improved post irrigation and debridement of the right hip, cultures demonstrate MSSA-ID following-currently on IV cefazolin/ rifampin.  She will need 8 weeks of IV ancef + rifampin. Discussed with pharmacy   stop date on antibiotics 9/26 Status post repeat Repeat I&D Right Hip (Right) 8/3 Follow up with Dr Adrian Prows at [facility]     Acute blood loss anemia Hemoglobin drop from 8.7 to 7.6 Status post transfusion of 2 units,hemoglobin 9.6, repeat in am    C. difficile colitis-on by mouth vancomycin, treat for 14 days, continue for another 7 days post discharge if leaving on Monday   Chronic Hyponatremia - Suspect SIADH, vs dehydration in the setting of C. difficile, continue low-dose Lasix , start patient on fluid restriction, slight improvement in sodium after being treated with normal saline, will DC IV fluids and watch sodium levels, probably back to baseline today Patient has had a low sodium for at least several years, baseline around 128-130   Hypokalemia - Likely due GI loss and Lasix.repleted, potassium 3.3, replete today and cont oral K 20 meq BID outpatient  Recheck BMP in magnesium in the morning  Hypertension  - Controlled, continue atenolol  Seizure disorder - Controlled, continue with carbamazepine- Monitor sodium.   History of depression with anxiety - Continue with Xanax, Lexapro, Remeron and trazodone- Monitor sodium.  Hx of Malignant Melanoma in Situ-right leg - Followed at [facility]  Code Status: Full Family Communication: None at bedside Disposition Plan:  disposition per orthopedics, internal medicine will sign off, please call [phone], if you  have any questions   DVT Prophylaxis: Lovenox   Consultants:  Orthopedic  Infectious Disease  Procedures:  Irrigation and debridement of right hip 11/25/14  Antibiotics:  Vancomycin 11/24/14 >>  Zosyn 11/24/14 >>11/25/14  Rifampin 11/27/14 >>  Objective: Filed Vitals:   12/01/14 0524  BP: 156/88  Pulse: 76  Temp: 98.4 F (36.9 C)  Resp: 18    Intake/Output Summary (Last 24 hours) at 12/01/14 1410 Last data filed at 12/01/14 0900  Gross per 24 hour  Intake      0 ml  Output    300 ml  Net   -300 ml   Filed Weights   11/24/14 0018  Weight: 90.719 kg (200 lb)    Exam:   General:  Patient is seated in recliner no acute distress, she is alert and oriented x 3 with clear speech   Cardiovascular: S1 S2 RRR, no m/r/g, 2+ DP pulses, 1+ LE edema bilaterally   Respiratory: Clear to auscultation, no w/r/r, normal respiratory effort  Abdomen: Bowel sounds present, soft, non-distended, TTP LLQ, no guarding or peritoneal signs  Musculoskeletal: grossly normal tone, 5/5 strength BLE  Skin: Mild erythema surrounding incision site on right hip, no rashes  Data Reviewed: Basic Metabolic Panel:  Recent Labs Lab 11/26/14 0444 11/27/14 0505 11/28/14 1703 11/29/14 0525 11/30/14 0540 12/01/14 0500  NA 125* 126* 131* 130* 131* 133*  K 3.3* 2.9* 3.1* 2.9* 3.7 3.3*  CL 95* 90*  --  92* 96* 92*  CO2 24 27  --  29 29 31   GLUCOSE 148* 139* 148* 137* 147* 140*  BUN 8 8  --  8 6 <5*  CREATININE  0.86 0.89  --  0.90 0.72 0.76  CALCIUM 8.2* 8.8*  --  8.3* 8.0* 8.6*  MG  --   --   --  1.5*  --  1.9   Liver Function Tests:  Recent Labs Lab 11/24/14 1455 11/29/14 0525 11/30/14 0540 12/01/14 0500  AST 26 19 29 26   ALT 17 17 14 14   ALKPHOS 62 83 77 76  BILITOT 0.9 1.1 1.3* 0.7  PROT 7.3 5.6* 5.5* 6.0*  ALBUMIN 3.6 2.1* 2.2* 2.3*   CBC:  Recent Labs Lab 11/24/14 1455  11/26/14 0444 11/27/14 0505 11/28/14 0527 11/28/14 1703 11/29/14 0525 11/30/14 0540  WBC  21.1*  < > 7.4 7.3 5.7  --  6.4 7.3  NEUTROABS 18.6*  --   --   --   --   --   --   --   HGB 11.5*  < > 8.2* 8.4* 8.7* 8.2* 7.6* 9.6*  HCT 34.0*  < > 23.5* 24.7* 25.6* 24.0* 22.9* 28.4*  MCV 83.1  < > 81.3 80.5 81.8  --  81.5 82.1  PLT 150  < > 120* 158 175  --  209 215  < > = values in this interval not displayed.  Recent Results (from the past 240 hour(s))  Blood culture (routine x 2)     Status: None   Collection Time: 11/23/14  1:32 PM  Result Value Ref Range Status   Specimen Description BLOOD RIGHT HAND  Final   Special Requests BOTTLES DRAWN AEROBIC AND ANAEROBIC  1CC  Final   Culture NO GROWTH 5 DAYS  Final   Report Status 11/28/2014 FINAL  Final  Blood culture (routine x 2)     Status: None   Collection Time: 11/23/14  2:14 PM  Result Value Ref Range Status   Specimen Description BLOOD LEFT ASSIST CONTROL  Final   Special Requests   Final    BOTTLES DRAWN AEROBIC AND ANAEROBIC  AER [place] ANA [place]   Culture NO GROWTH 5 DAYS  Final   Report Status 11/28/2014 FINAL  Final  Culture, blood (x 2)     Status: None   Collection Time: 11/24/14  2:55 PM  Result Value Ref Range Status   Specimen Description BLOOD LEFT ARM  Final   Special Requests IN PEDIATRIC BOTTLE 3CC  Final   Culture NO GROWTH 5 DAYS  Final   Report Status 11/29/2014 FINAL  Final  Culture, blood (x 2)     Status: None   Collection Time: 11/24/14  3:13 PM  Result Value Ref Range Status   Specimen Description BLOOD RIGHT ANTECUBITAL  Final   Special Requests BOTTLES DRAWN AEROBIC ONLY 5CC  Final   Culture NO GROWTH 5 DAYS  Final   Report Status 11/29/2014 FINAL  Final  Urine culture     Status: None   Collection Time: 11/24/14  4:00 PM  Result Value Ref Range Status   Specimen Description URINE, RANDOM  Final   Special Requests ADDED 2213  Final   Culture NO GROWTH 2 DAYS  Final   Report Status 11/26/2014 FINAL  Final  Stool culture     Status: None   Collection Time: 11/24/14 10:16 PM  Result Value Ref  Range Status   Specimen Description STOOL  Final   Special Requests NONE  Final   Culture   Final    NO SALMONELLA, SHIGELLA, CAMPYLOBACTER, YERSINIA, OR E.COLI 0157:H7 ISOLATED Performed at [facility]    Report Status  11/29/2014 FINAL  Final  Clostridium Difficile by PCR (not at [facility] [place])     Status: Abnormal   Collection Time: 11/24/14 10:17 PM  Result Value Ref Range Status   Toxigenic C Difficile by pcr POSITIVE (A) NEGATIVE Final    Comment: CRITICAL RESULT CALLED TO, READ BACK BY AND VERIFIED WITH: Leonia Corona, RN AT 1401 11/25/14 BY A DAVIS   Surgical pcr screen     Status: None   Collection Time: 11/25/14  3:45 PM  Result Value Ref Range Status   MRSA, PCR NEGATIVE NEGATIVE Final   Staphylococcus aureus NEGATIVE NEGATIVE Final    Comment:        The Xpert SA Assay (FDA approved for NASAL specimens in patients over 39 years of age), is one component of a comprehensive surveillance program.  Test performance has been validated by [facility] for patients greater than or equal to 34 year old. It is not intended to diagnose infection nor to guide or monitor treatment.   Body fluid culture     Status: None   Collection Time: 11/25/14  4:51 PM  Result Value Ref Range Status   Specimen Description SYNOVIAL RIGHT HIP  Final   Special Requests SYNOVIAL RIGHT HIP  Final   Gram Stain   Final    ABUNDANT WBC PRESENT,BOTH PMN AND MONONUCLEAR FEW GRAM POSITIVE COCCI IN CLUSTERS IN CHAINS CRITICAL RESULT CALLED TO, READ BACK BY AND VERIFIED WITH: RIMANDO,D RN 11/25/14 2100 [place] CONFIRMED BY V WILIKINS    Culture ABUNDANT STAPHYLOCOCCUS AUREUS  Final   Report Status 11/29/2014 FINAL  Final   Organism ID, Bacteria STAPHYLOCOCCUS AUREUS  Final      Susceptibility   Staphylococcus aureus - MIC*    CIPROFLOXACIN <=0.5 SENSITIVE Sensitive     ERYTHROMYCIN <=0.25 SENSITIVE Sensitive     GENTAMICIN <=0.5 SENSITIVE Sensitive     OXACILLIN 0.5 SENSITIVE Sensitive      TETRACYCLINE <=1 SENSITIVE Sensitive     VANCOMYCIN <=0.5 SENSITIVE Sensitive     TRIMETH/SULFA <=10 SENSITIVE Sensitive     CLINDAMYCIN <=0.25 SENSITIVE Sensitive     RIFAMPIN <=0.5 SENSITIVE Sensitive     Inducible Clindamycin NEGATIVE Sensitive     * ABUNDANT STAPHYLOCOCCUS AUREUS  Anaerobic culture     Status: None   Collection Time: 11/25/14  4:54 PM  Result Value Ref Range Status   Specimen Description ABSCESS RIGHT HIP  Final   Special Requests NONE  Final   Gram Stain   Final    FEW WBC PRESENT, PREDOMINANTLY PMN NO SQUAMOUS EPITHELIAL CELLS SEEN FEW GRAM POSITIVE COCCI IN PAIRS IN CLUSTERS Performed at [facility]    Culture   Final    NO ANAEROBES ISOLATED Performed at [facility]    Report Status 11/29/2014 FINAL  Final  Culture, routine-abscess     Status: None   Collection Time: 11/25/14  4:54 PM  Result Value Ref Range Status   Specimen Description ABSCESS RIGHT HIP  Final   Special Requests NONE  Final   Gram Stain   Final    FEW WBC PRESENT,BOTH PMN AND MONONUCLEAR NO SQUAMOUS EPITHELIAL CELLS SEEN FEW GRAM POSITIVE COCCI IN PAIRS IN CLUSTERS Performed at [facility]    Culture   Final    ABUNDANT STAPHYLOCOCCUS AUREUS Note: RIFAMPIN AND GENTAMICIN SHOULD NOT BE USED AS SINGLE DRUGS FOR TREATMENT OF STAPH INFECTIONS. Performed at [facility]    Report Status 11/28/2014 FINAL  Final  Organism ID, Bacteria STAPHYLOCOCCUS AUREUS  Final      Susceptibility   Staphylococcus aureus - MIC*    CLINDAMYCIN <=0.25 SENSITIVE Sensitive     ERYTHROMYCIN <=0.25 SENSITIVE Sensitive     GENTAMICIN <=0.5 SENSITIVE Sensitive     LEVOFLOXACIN 0.25 SENSITIVE Sensitive     OXACILLIN 0.5 SENSITIVE Sensitive     RIFAMPIN <=0.5 SENSITIVE Sensitive     TRIMETH/SULFA <=10 SENSITIVE Sensitive     VANCOMYCIN 1 SENSITIVE Sensitive     TETRACYCLINE <=1 SENSITIVE Sensitive     MOXIFLOXACIN <=0.25 SENSITIVE Sensitive     * ABUNDANT  STAPHYLOCOCCUS AUREUS  Tissue culture     Status: None   Collection Time: 11/25/14  5:43 PM  Result Value Ref Range Status   Specimen Description TISSUE RIGHT HIP  Final   Special Requests NONE  Final   Gram Stain   Final    MODERATE WBC PRESENT,BOTH PMN AND MONONUCLEAR ABUNDANT GRAM POSITIVE COCCI IN PAIRS Gram Stain Report Called to,Read Back By and Verified With: Gram Stain Report Called to,Read Back By and Verified With: Y.Rimando RN on 11/26/14 at 02:58 by Rise Mu Performed at [facility]    Culture   Final    ABUNDANT STAPHYLOCOCCUS AUREUS Note: RIFAMPIN AND GENTAMICIN SHOULD NOT BE USED AS SINGLE DRUGS FOR TREATMENT OF STAPH INFECTIONS. Performed at [facility]    Report Status 11/28/2014 FINAL  Final   Organism ID, Bacteria STAPHYLOCOCCUS AUREUS  Final      Susceptibility   Staphylococcus aureus - MIC*    CLINDAMYCIN <=0.25 SENSITIVE Sensitive     ERYTHROMYCIN <=0.25 SENSITIVE Sensitive     GENTAMICIN <=0.5 SENSITIVE Sensitive     LEVOFLOXACIN 0.25 SENSITIVE Sensitive     OXACILLIN 0.5 SENSITIVE Sensitive     RIFAMPIN <=0.5 SENSITIVE Sensitive     TRIMETH/SULFA <=10 SENSITIVE Sensitive     VANCOMYCIN <=0.5 SENSITIVE Sensitive     TETRACYCLINE <=1 SENSITIVE Sensitive     MOXIFLOXACIN <=0.25 SENSITIVE Sensitive     * ABUNDANT STAPHYLOCOCCUS AUREUS     Studies: No results found.  Scheduled Meds: . aspirin EC  325 mg Oral BID PC  . atenolol  50 mg Oral BID  . carbamazepine  300 mg Oral BID  .  ceFAZolin (ANCEF) IV  2 g Intravenous 3 times per day  . escitalopram  20 mg Oral Daily  . furosemide  20 mg Oral Daily  . mirtazapine  22.5 mg Oral QHS  . ondansetron (ZOFRAN) IV  4 mg Intravenous TID AC  . potassium chloride  20 mEq Oral BID  . rifampin  300 mg Oral Q12H  . traZODone  50 mg Oral QHS  . vancomycin  125 mg Oral QID    Principal Problem:   Acute pain of right hip; hip infection Active Problems:   Right hip pain   Hyponatremia   BP  (high blood pressure)   Prosthetic hip infection   Staphylococcus aureus infection   Enteritis due to Clostridium difficile   Screen for STD (sexually transmitted disease)   Septic arthritis       If 7PM-7AM, please contact night-coverage at [email or web address], password [facility] 12/01/2014, 2:10 PM  LOS: 7 days        [facility]

## 2014-12-02 LAB — CBC
HCT: 30.4 % — ABNORMAL LOW (ref 36.0–46.0)
Hemoglobin: 10.1 g/dL — ABNORMAL LOW (ref 12.0–15.0)
MCH: 27.7 pg (ref 26.0–34.0)
MCHC: 33.2 g/dL (ref 30.0–36.0)
MCV: 83.5 fL (ref 78.0–100.0)
PLATELETS: 220 10*3/uL (ref 150–400)
RBC: 3.64 MIL/uL — ABNORMAL LOW (ref 3.87–5.11)
RDW: 14.6 % (ref 11.5–15.5)
WBC: 8 10*3/uL (ref 4.0–10.5)

## 2014-12-02 LAB — COMPREHENSIVE METABOLIC PANEL
ALBUMIN: 2.7 g/dL — AB (ref 3.5–5.0)
ALK PHOS: 95 U/L (ref 38–126)
ALT: 21 U/L (ref 14–54)
AST: 53 U/L — ABNORMAL HIGH (ref 15–41)
Anion gap: 10 (ref 5–15)
BUN: 6 mg/dL (ref 6–20)
CHLORIDE: 92 mmol/L — AB (ref 101–111)
CO2: 31 mmol/L (ref 22–32)
Calcium: 9.5 mg/dL (ref 8.9–10.3)
Creatinine, Ser: 0.84 mg/dL (ref 0.44–1.00)
GFR calc Af Amer: >60 mL/min (ref >60)
GFR calc non Af Amer: >60 mL/min (ref >60)
Glucose, Bld: 149 mg/dL — ABNORMAL HIGH (ref 65–99)
Potassium: 3.9 mmol/L (ref 3.5–5.1)
Sodium: 133 mmol/L — ABNORMAL LOW (ref 135–145)
TOTAL PROTEIN: 6.7 g/dL (ref 6.5–8.1)
Total Bilirubin: 0.7 mg/dL (ref 0.3–1.2)

## 2014-12-02 LAB — MAGNESIUM: Magnesium: 1.9 mg/dL (ref 1.7–2.4)

## 2014-12-02 NOTE — Progress Notes (Addendum)
CSW met with patient and provided current bed offers. She accepts a bed at [facility].  CSW left a v-mail for the Admissions Director at [facility] re: above.  Awaiting stability per MD for d/c.  Patient relates that she "thinks" she will be d/c'd tomorrow.  Weekday CSW will follow up and assist with d/c as indicated. Patient noted to be ambulatory in her room. She will contact her daughter to determine if she can transport her by car vs need for EMS transport.   Lorie Phenix. Pauline Good, [place]

## 2014-12-02 NOTE — Progress Notes (Signed)
Subjective: 4 Days Post-Op Procedure(s) (LRB): Repeat I&D Right Hip (Right) Patient reports pain as mild.    Objective: Vital signs in last 24 hours: Temp:  [98.3 F (36.8 C)-98.8 F (37.1 C)] 98.5 F (36.9 C) (08/07 6967) Pulse Rate:  [76-86] 79 (08/07 1013) Resp:  [15-18] 16 (08/07 0632) BP: (126-159)/(71-82) 126/71 mmHg (08/07 1013) SpO2:  [98 %-100 %] 98 % (08/07 8938) Weight:  [88.678 kg (195 lb 8 oz)] 88.678 kg (195 lb 8 oz) (08/07 0600)  Intake/Output from previous day: 08/06 0701 - 08/07 0700 In: 700 [P.O.:480; I.V.:120; IV Piggyback:100] Out: 300 [Urine:300] Intake/Output this shift: Total I/O In: 240 [P.O.:240] Out: -    Recent Labs  11/30/14 0540 12/02/14 0632  HGB 9.6* 10.1*    Recent Labs  11/30/14 0540 12/02/14 0632  WBC 7.3 8.0  RBC 3.46* 3.64*  HCT 28.4* 30.4*  PLT 215 220    Recent Labs  11/30/14 0540 12/01/14 0500  NA 131* 133*  K 3.7 3.3*  CL 96* 92*  CO2 29 31  BUN 6 <5*  CREATININE 0.72 0.76  GLUCOSE 147* 140*  CALCIUM 8.0* 8.6*   No results for input(s): LABPT, INR in the last 72 hours.  Neurologically intact  Assessment/Plan: 4 Days Post-Op Procedure(s) (LRB): Repeat I&D Right Hip (Right) Up with therapy Discharge to SNF likely Monday  Anaiyah Anglemyer C 12/02/2014, 10:38 AM

## 2014-12-03 NOTE — Discharge Instructions (Signed)

## 2014-12-03 NOTE — Progress Notes (Signed)
   Subjective:  Patient reports pain as mild.  Objective:   VITALS:   Filed Vitals:   12/02/14 1338 12/02/14 2140 12/03/14 0447 12/03/14 1326  BP: 139/72 147/78 132/72 142/67  Pulse: 78 77 78 80  Temp: 97.9 F (36.6 C) 98.4 F (36.9 C) 98.4 F (36.9 C) 98.5 F (36.9 C)  TempSrc: Oral Oral Oral Oral  Resp:  16 17 18   Weight:      SpO2: 99% 100% 98% 99%    Neurologically intact Neurovascular intact Sensation intact distally Intact pulses distally Dorsiflexion/Plantar flexion intact Incision: dressing C/D/I and no drainage No cellulitis present Compartment soft   Lab Results  Component Value Date   WBC 8.0 12/02/2014   HGB 10.1* 12/02/2014   HCT 30.4* 12/02/2014   MCV 83.5 12/02/2014   PLT 220 12/02/2014     Assessment/Plan:  5 Days Post-Op   - Expected postop acute blood loss anemia - will monitor for symptoms - Up with PT/OT - DVT ppx - SCDs, ambulation, asa - WBAT operative extremity - Pain control - Discharge planning - SNF today  Marianna Payment 12/03/2014, 4:00 PM [phone]

## 2014-12-03 NOTE — Clinical Social Work Note (Addendum)
Patient has SNF bed ready at [facility] once medically stable for transfer.  If patient discharges after 3:30pm, please contact after hours CSW at [phone] to coordinate.  Packet on the chart: please print discharge summary and enclose it in the packet to be sent with patient to [facility]. [phone])  Nonnie Done, LCSW [phone]  [place] (5N 1-8) Clinical Social Worker

## 2014-12-03 NOTE — Progress Notes (Signed)
Physical Therapy Treatment Patient Details Name: Christina French MRN: [id number] DOB: 06-25-1955 Today's Date: 12/03/2014    History of Present Illness 59 yo female with pain R hip now having received surgical wash of R hip and will later have revision due to metal on metal degrading    PT Comments    Patient agreeable to ambulation today. Patient able to walk out into hallway holding IV pole. Patient stated that she feels much better now that she is not nauseated. Patient very motivated to go to SNF to progress therapy and gain functional independence prior to returning home alone.   Follow Up Recommendations  SNF     Equipment Recommendations  None recommended by PT    Recommendations for Other Services       Precautions / Restrictions Precautions Precautions: Fall Restrictions LLE Weight Bearing: Weight bearing as tolerated    Mobility  Bed Mobility Overal bed mobility: Modified Independent                Transfers Overall transfer level: Needs assistance Equipment used: Rolling walker (2 wheeled)   Sit to Stand: Supervision         General transfer comment: Supervision for safety.   Ambulation/Gait Ambulation/Gait assistance: Supervision Ambulation Distance (Feet): 140 Feet Assistive device: None (Patient rolling IV pole) Gait Pattern/deviations: Step-through pattern;Decreased stride length;Decreased stance time - left;Decreased step length - right   Gait velocity interpretation: Below normal speed for age/gender General Gait Details: . Overall gait was steady. Supervision for safety.    Stairs            Wheelchair Mobility    Modified Rankin (Stroke Patients Only)       Balance                                    Cognition Arousal/Alertness: Awake/alert Behavior During Therapy: WFL for tasks assessed/performed Overall Cognitive Status: Within Functional Limits for tasks assessed                      Exercises       General Comments        Pertinent Vitals/Pain Pain Assessment: No/denies pain    Home Living                      Prior Function            PT Goals (current goals can now be found in the care plan section) Progress towards PT goals: Progressing toward goals    Frequency  Min 3X/week    PT Plan Current plan remains appropriate    Co-evaluation             End of Session   Activity Tolerance: Patient tolerated treatment well Patient left: in bed;with call bell/phone within reach     Time: 1132-1150 PT Time Calculation (min) (ACUTE ONLY): 18 min  Charges:  $Gait Training: 8-22 mins                    G Codes:      [name] 12/03/2014, 11:55 AM  12/03/2014 [name] PTA [phone] pager [phone] office

## 2014-12-03 NOTE — Discharge Summary (Addendum)
Physician Discharge Summary      Patient ID: Christina French MRN: [id number] DOB/AGE: [birth year] 59 y.o.  Admit date: 11/23/2014 Discharge date: 12/04/2014  Admission Diagnoses:  Acute pain of right hip  Discharge Diagnoses:  Principal Problem:   Acute pain of right hip; hip infection Active Problems:   Right hip pain   Hyponatremia   BP (high blood pressure)   Prosthetic hip infection   Staphylococcus aureus infection   Enteritis due to Clostridium difficile   Screen for STD (sexually transmitted disease)   Septic arthritis   Past Medical History  Diagnosis Date  . Hypertension   . Anemia   . Anxiety   . Depression   . Seizures     rare - last one 07/30/14 - was very anemic  . GERD (gastroesophageal reflux disease)   . IBS (irritable bowel syndrome)   . Barrett esophagus   . Vertigo     thinks related to sinus issues  . Renal insufficiency   . Cancer     Surgeries: Procedure(s): Repeat I&D Right Hip on 11/23/2014 - 11/28/2014   Consultants (if any):    Discharged Condition: Improved  Hospital Course: Christina French is an 59 y.o. female who was admitted 11/23/2014 with a diagnosis of Acute pain of right hip and went to the operating room on 11/23/2014 - 11/28/2014 and underwent the above named procedures.    She was given perioperative antibiotics:      Anti-infectives    Start     Dose/Rate Route Frequency Ordered Stop   11/27/14 2200  ceFAZolin (ANCEF) IVPB 2 g/50 mL premix     2 g 100 mL/hr over 30 Minutes Intravenous 3 times per day 11/27/14 2118     11/27/14 1000  rifampin (RIFADIN) capsule 300 mg     300 mg Oral Every 12 hours 11/26/14 2046     11/26/14 1200  vancomycin (VANCOCIN) 1,250 mg in sodium chloride 0.9 % 250 mL IVPB  Status:  Discontinued     1,250 mg 166.7 mL/hr over 90 Minutes Intravenous Every 12 hours 11/26/14 0017 11/27/14 2104   11/26/14 0030  vancomycin (VANCOCIN) 1,250 mg in sodium chloride 0.9 % 250 mL IVPB     1,250 mg 166.7 mL/hr over 90  Minutes Intravenous NOW 11/26/14 0017 11/26/14 0315   11/25/14 1756  vancomycin (VANCOCIN) powder  Status:  Discontinued       As needed 11/25/14 1756 11/25/14 1814   11/25/14 1754  gentamicin (GARAMYCIN) injection  Status:  Discontinued       As needed 11/25/14 1755 11/25/14 1814   11/25/14 1700  vancomycin (VANCOCIN) IVPB 1000 mg/200 mL premix  Status:  Discontinued     1,000 mg 200 mL/hr over 60 Minutes Intravenous Every 12 hours 11/25/14 0916 11/25/14 1113   11/25/14 1500  vancomycin (VANCOCIN) 50 mg/mL oral solution 125 mg     125 mg Oral 4 times daily 11/25/14 1415 12/09/14 1359   11/24/14 1200  piperacillin-tazobactam (ZOSYN) IVPB 3.375 g  Status:  Discontinued     3.375 g 12.5 mL/hr over 240 Minutes Intravenous 3 times per day 11/24/14 0210 11/25/14 1113   11/24/14 0700  vancomycin (VANCOCIN) IVPB 750 mg/150 ml premix  Status:  Discontinued     750 mg 150 mL/hr over 60 Minutes Intravenous Every 12 hours 11/24/14 0210 11/25/14 0916   11/24/14 0300  piperacillin-tazobactam (ZOSYN) IVPB 3.375 g     3.375 g 100 mL/hr over 30 Minutes Intravenous  Once  11/24/14 0210 11/24/14 0858    .  She was given sequential compression devices, early ambulation, and aspirin for DVT prophylaxis.  She benefited maximally from the hospital stay and there were no complications.    Recent vital signs:  Filed Vitals:   12/04/14 0447  BP: 142/82  Pulse: 75  Temp: 98.3 F (36.8 C)  Resp: 18    Recent laboratory studies:  Lab Results  Component Value Date   HGB 10.1* 12/02/2014   HGB 9.6* 11/30/2014   HGB 7.6* 11/29/2014   Lab Results  Component Value Date   WBC 8.0 12/02/2014   PLT 220 12/02/2014   Lab Results  Component Value Date   INR 1.40 11/24/2014   Lab Results  Component Value Date   NA 133* 12/02/2014   K 3.9 12/02/2014   CL 92* 12/02/2014   CO2 31 12/02/2014   BUN 6 12/02/2014   CREATININE 0.84 12/02/2014   GLUCOSE 149* 12/02/2014    Discharge Medications:       Medication List    TAKE these medications        ALPRAZolam 1 MG tablet  Commonly known as:  XANAX  Take 1 mg by mouth 2 (two) times daily as needed for anxiety.     atenolol 50 MG tablet  Commonly known as:  TENORMIN  Take 50 mg by mouth 2 (two) times daily.     carbamazepine 200 MG tablet  Commonly known as:  TEGRETOL  Take 300 mg by mouth 2 (two) times daily.     dexlansoprazole 60 MG capsule  Commonly known as:  DEXILANT  Take 60 mg by mouth daily.     escitalopram 20 MG tablet  Commonly known as:  LEXAPRO  Take 20 mg by mouth daily.     hyoscyamine 0.125 MG tablet  Commonly known as:  LEVSIN, ANASPAZ  Take 1 tablet (0.125 mg total) by mouth 4 (four) times daily as needed.     ibuprofen 200 MG tablet  Commonly known as:  ADVIL,MOTRIN  Take 200 mg by mouth every 4 (four) hours as needed for headache or mild pain.     mirtazapine 15 MG tablet  Commonly known as:  REMERON  Take 22.5 mg by mouth at bedtime.     ondansetron 8 MG tablet  Commonly known as:  ZOFRAN  Take 8 mg by mouth 2 (two) times daily as needed for nausea or vomiting.     traZODone 50 MG tablet  Commonly known as:  DESYREL  Take 50 mg by mouth at bedtime.        Diagnostic Studies: Dg Chest 2 View  11/23/2014   CLINICAL DATA:  Fever  EXAM: CHEST  2 VIEW  COMPARISON:  05/29/2005  FINDINGS: Cardiac enlargement without heart failure. Vascularity is normal. Lungs are clear without infiltrate effusion or mass. No change from the prior study.  IMPRESSION: No active cardiopulmonary disease.   Electronically Signed   By: Franchot Gallo M.D.   On: 11/23/2014 13:11   Ct Chest W Contrast  11/24/2014   CLINICAL DATA:  Nonproductive cough for 2 months. Decreased appetite. Sepsis.  EXAM: CT CHEST WITH CONTRAST  TECHNIQUE: Multidetector CT imaging of the chest was performed during intravenous contrast administration.  CONTRAST:  14mL OMNIPAQUE IOHEXOL 300 MG/ML  SOLN  COMPARISON:  Chest x-ray 11/23/2014   FINDINGS: Linear scarring in the lung bases. Lungs are otherwise clear. No pleural effusions.  Heart is normal size. Aorta is normal caliber. No  mediastinal, hilar, or axillary adenopathy. Chest wall soft tissues are unremarkable.  Imaging into the upper abdomen shows no acute findings. No acute bony abnormality or focal bone lesion.  IMPRESSION: No acute cardiopulmonary disease.   Electronically Signed   By: Rolm Baptise M.D.   On: 11/24/2014 17:01   Mr Hip Right Wo Contrast  11/25/2014   CLINICAL DATA:  Severe pain. RIGHT hip pain. Infected RIGHT hip arthroplasty.  EXAM: MR OF THE RIGHT HIP WITHOUT CONTRAST  TECHNIQUE: Multiplanar, multisequence MR imaging was performed. No intravenous contrast was administered.  COMPARISON:  Hip aspiration 11/24/2014.  CT 12/04/2010.  FINDINGS: RIGHT total hip arthroplasty is present. Tomi Bamberger protocol was employed. There is heterogeneous fluid collection dorsal to the RIGHT hip. This measures 62 mm transverse by 37 mm AP. On sagittal imaging this measures 8 cm. This is bilobed and has a rind of low signal material, which may represent calcification or clot. Because the hip joint is obscured by artifact from arthroplasty, it is unclear whether this communicates with the hip joint itself. Atrophy of the deep RIGHT gluteus maximus muscle is present adjacent to the fluid collection. Visible portions of the LEFT obturator ring appear intact. Lumbar spondylosis is present with L4-L5 moderate central stenosis. Contralateral LEFT hip appears normal. Hysterectomy is incidentally noted.  Periarticular soft tissues demonstrate edema around the RIGHT hip tracking into the proximal RIGHT thigh.  There is no evidence of loosening of the femoral component. The acetabular component is almost completely obscured by susceptibility artifact.  Notably, when compared to the prior CT of 12/04/2010, the fluid collection appears larger (image 91 series 2 of prior CT). The gluteal muscular atrophy was  present and a smaller fluid collection was present on the prior CT.  IMPRESSION: 1. 6.2 x 3.7 x 8.0 cm posterior and lateral RIGHT hip fluid collection. In this patient with metal on metal arthroplasty, this may represent pseudotumor associated with arthroplasty complication. Hematoma or abscess are in the differential considerations. The amount of edema in the surrounding musculature and soft tissues suggests active inflammation. 2. Given the recent hip aspiration with dry tap, a CT-guided aspiration of the collection could be could considered for microbiology in this patient with an elevated white blood cell count. Septic arthritis of the hip arthroplasty remains in the differential considerations.   Electronically Signed   By: Dereck Ligas M.D.   On: 11/25/2014 12:10   Dg Fluoro Guide Ndl Plcd/bx/inj/loc  11/24/2014   CLINICAL DATA:  Right hip pain, status post right hip arthroplasty, hip aspiration of possible effusion  EXAM: RIGHT HIP ASPIRATION UNDER FLUOROSCOPY  FLUOROSCOPY TIME:  Fluoroscopy Time (in minutes and seconds): 1 minute 12 seconds  Number of Acquired Images:  2  PROCEDURE: Overlying skin prepped with Betadine, draped in the usual sterile fashion, and infiltrated locally with buffered Lidocaine.  20 gauge spinal needle advanced to the superolateral margin of the right femoral head prosthesis. 1 ml of Lidocaine injected easily. No fluid could be aspirated from the joint.  Repeat attempted at the level of the femoral neck prosthesis. No fluid could be aspirated from the joint.  Study was terminated after consultation with the referring physician.  No immediate complication.  IMPRESSION: Attempted right hip aspiration under fluoroscopy.  No fluid could be aspirated from the joint at the level of the femoral head or neck.   Electronically Signed   By: Julian Hy M.D.   On: 11/24/2014 13:48   Dg Hip Unilat With Pelvis 1v Right  11/23/2014   CLINICAL DATA:  Right hip pain with vomiting  and fever.  EXAM: DG HIP (WITH OR WITHOUT PELVIS) 1V RIGHT  COMPARISON:  Earlier today as well as 03/27/2010.  FINDINGS: There are mild degenerative changes of the left hip. Right hip arthroplasty unchanged. Mild degenerative changes of the spine. Surgical sutures over the pelvis just right of midline.  IMPRESSION: Right hip arthroplasty unchanged. No acute findings. Degenerative change left hip.   Electronically Signed   By: Marin Olp M.D.   On: 11/23/2014 21:43   Dg Hip Unilat With Pelvis 2-3 Views Right  11/23/2014   CLINICAL DATA:  Acute right hip pain.  EXAM: DG HIP (WITH OR WITHOUT PELVIS) 2-3V RIGHT  COMPARISON:  March 27, 2010.  FINDINGS: Status post right hip arthroplasty. No acute fracture or dislocation is noted. Left hip appears normal. Sacroiliac joints appear normal.  IMPRESSION: Status post right hip arthroplasty.  No acute abnormality seen.   Electronically Signed   By: Marijo Conception, M.D.   On: 11/23/2014 12:17   Ct Maxillofacial Wo Cm  11/23/2014   CLINICAL DATA:  Frontal headache for several days.  Febrile.  EXAM: CT MAXILLOFACIAL WITHOUT CONTRAST  TECHNIQUE: Multidetector CT imaging of the maxillofacial structures was performed. Multiplanar CT image reconstructions were also generated. A small metallic BB was placed on the right temple in order to reliably differentiate right from left.  COMPARISON:  None.  FINDINGS: The bones are intact. There is no fracture, bone lesion or bony destruction. There is an air-fluid level in the left sphenoid sinus with no membrane thickening. This was also visible on the MRI of 08/24/2012, and is nonspecific. There is no significant membrane thickening in any of the paranasal sinuses. There is no retention cyst or polyp.  IMPRESSION: Minimal changes with a left sphenoid air-fluid level, but no inflammatory soft tissue thickening. Remainder of the paranasal sinuses are clear.   Electronically Signed   By: Andreas Newport M.D.   On: 11/23/2014 17:47      Disposition: [facility] Not Defined  Discharge Instructions    Call MD / Call 911    Complete by:  As directed   If you experience chest pain or shortness of breath, CALL 911 and be transported to the hospital emergency room.  If you develope a fever above 101.5 F, pus (white drainage) or increased drainage or redness at the wound, or calf pain, call your surgeon's office.     Call MD / Call 911    Complete by:  As directed   If you experience chest pain or shortness of breath, CALL 911 and be transported to the hospital emergency room.  If you develope a fever above 101.5 F, pus (white drainage) or increased drainage or redness at the wound, or calf pain, call your surgeon's office.     Constipation Prevention    Complete by:  As directed   Drink plenty of fluids.  Prune juice may be helpful.  You may use a stool softener, such as Colace (over the counter) 100 mg twice a day.  Use MiraLax (over the counter) for constipation as needed.     Constipation Prevention    Complete by:  As directed   Drink plenty of fluids.  Prune juice may be helpful.  You may use a stool softener, such as Colace (over the counter) 100 mg twice a day.  Use MiraLax (over the counter) for constipation as needed.  Diet - low sodium heart healthy    Complete by:  As directed      Diet - low sodium heart healthy    Complete by:  As directed      Diet general    Complete by:  As directed      Diet general    Complete by:  As directed      Driving restrictions    Complete by:  As directed   No driving while taking narcotic pain meds.     Driving restrictions    Complete by:  As directed   No driving while taking narcotic pain meds.     Increase activity slowly as tolerated    Complete by:  As directed      Increase activity slowly as tolerated    Complete by:  As directed            Follow-up Information    Follow up with [facility].   Why:  They will  contact you to schedule home nurse and therapy visits.   Contact information:   [address] [place] [address] [phone]       Follow up with Mcarthur Rossetti, MD In 2 weeks.   Specialty:  Orthopedic Surgery   Why:  For suture removal, For wound re-check   Contact information:   [place] [address] [phone]        Signed: Marianna Payment 12/04/2014, 7:37 AM

## 2014-12-04 DIAGNOSIS — E871 Hypo-osmolality and hyponatremia: Secondary | ICD-10-CM | POA: Diagnosis not present

## 2014-12-04 DIAGNOSIS — D649 Anemia, unspecified: Secondary | ICD-10-CM | POA: Diagnosis not present

## 2014-12-04 DIAGNOSIS — K219 Gastro-esophageal reflux disease without esophagitis: Secondary | ICD-10-CM | POA: Diagnosis not present

## 2014-12-04 DIAGNOSIS — I10 Essential (primary) hypertension: Secondary | ICD-10-CM | POA: Diagnosis not present

## 2014-12-04 DIAGNOSIS — Z471 Aftercare following joint replacement surgery: Secondary | ICD-10-CM | POA: Diagnosis not present

## 2014-12-04 DIAGNOSIS — B999 Unspecified infectious disease: Secondary | ICD-10-CM | POA: Diagnosis not present

## 2014-12-04 DIAGNOSIS — F329 Major depressive disorder, single episode, unspecified: Secondary | ICD-10-CM | POA: Diagnosis not present

## 2014-12-04 DIAGNOSIS — K5900 Constipation, unspecified: Secondary | ICD-10-CM | POA: Diagnosis not present

## 2014-12-04 DIAGNOSIS — K589 Irritable bowel syndrome without diarrhea: Secondary | ICD-10-CM | POA: Diagnosis not present

## 2014-12-04 DIAGNOSIS — Z96641 Presence of right artificial hip joint: Secondary | ICD-10-CM | POA: Diagnosis not present

## 2014-12-04 DIAGNOSIS — T814XXA Infection following a procedure, initial encounter: Secondary | ICD-10-CM | POA: Diagnosis not present

## 2014-12-04 DIAGNOSIS — A047 Enterocolitis due to Clostridium difficile: Secondary | ICD-10-CM | POA: Diagnosis not present

## 2014-12-04 DIAGNOSIS — M00051 Staphylococcal arthritis, right hip: Secondary | ICD-10-CM | POA: Diagnosis not present

## 2014-12-04 DIAGNOSIS — G40909 Epilepsy, unspecified, not intractable, without status epilepticus: Secondary | ICD-10-CM | POA: Diagnosis not present

## 2014-12-04 DIAGNOSIS — T8451XA Infection and inflammatory reaction due to internal right hip prosthesis, initial encounter: Secondary | ICD-10-CM | POA: Diagnosis not present

## 2014-12-04 DIAGNOSIS — T8451XD Infection and inflammatory reaction due to internal right hip prosthesis, subsequent encounter: Secondary | ICD-10-CM | POA: Diagnosis not present

## 2014-12-04 MED ORDER — HEPARIN SOD (PORK) LOCK FLUSH 100 UNIT/ML IV SOLN
250.0000 [IU] | INTRAVENOUS | Status: AC | PRN
  Administered 2014-12-04: 250 [IU]

## 2014-12-04 NOTE — Discharge Planning (Signed)
Patient to be discharged to [facility]. Patient and patient's daughter updated via Therapist, sports.  Facility: [facility] RN report number: [phone] Transportation: [place], [place] [phone]) and Surgical [phone])

## 2014-12-04 NOTE — Clinical Social Work Placement (Signed)
   CLINICAL SOCIAL WORK PLACEMENT  NOTE  Date:  12/04/2014  Patient Details  Name: Christina French MRN: [id number] Date of Birth: 1955-04-29  Clinical Social Work is seeking post-discharge placement for this patient at the [place] level of care (*CSW will initial, date and re-position this form in  chart as items are completed):  Yes   Patient/family provided with [facility] Work Department's list of facilities offering this level of care within the geographic area requested by the patient (or if unable, by the patient's family).  Yes   Patient/family informed of their freedom to choose among providers that offer the needed level of care, that participate in Medicare, Medicaid or managed care program needed by the patient, have an available bed and are willing to accept the patient.  Yes   Patient/family informed of [facility]'s ownership interest in [facility] and [facility], as well as of the fact that they are under no obligation to receive care at these facilities.  PASRR submitted to EDS on 11/30/14     PASRR number received on 11/30/14     Existing PASRR number confirmed on       FL2 transmitted to all facilities in geographic area requested by pt/family on 11/30/14     FL2 transmitted to all facilities within larger geographic area on       Patient informed that his/her managed care company has contracts with or will negotiate with certain facilities, including the following:        Yes   Patient/family informed of bed offers received.  Patient chooses bed at [facility]     Physician recommends and patient chooses bed at  (n/a)    Patient to be transferred to [facility] on 12/04/14.  Patient to be transferred to facility by [facility]     Patient family notified on 12/04/14 of transfer.  Name of family member notified:  RN spoke with patient's daughter via phone     PHYSICIAN       Additional  Comment:    _______________________________________________ [name], LCSW 12/04/2014, 11:18 AM [phone]

## 2014-12-05 DIAGNOSIS — K219 Gastro-esophageal reflux disease without esophagitis: Secondary | ICD-10-CM | POA: Diagnosis not present

## 2014-12-05 DIAGNOSIS — I10 Essential (primary) hypertension: Secondary | ICD-10-CM | POA: Diagnosis not present

## 2014-12-05 DIAGNOSIS — M00051 Staphylococcal arthritis, right hip: Secondary | ICD-10-CM | POA: Diagnosis not present

## 2014-12-05 DIAGNOSIS — A047 Enterocolitis due to Clostridium difficile: Secondary | ICD-10-CM | POA: Diagnosis not present

## 2014-12-10 ENCOUNTER — Telehealth: Payer: Self-pay

## 2014-12-10 NOTE — Telephone Encounter (Signed)
See note below

## 2014-12-10 NOTE — Telephone Encounter (Signed)
Only post treatment right now is a repeat stool culture lab to check for c-diff. Only ov if this comes back positive a 2nd time.

## 2014-12-10 NOTE — Telephone Encounter (Signed)
Pt is previous patient of Vickey Huger, NP. States that she is currently at [facility] status post Hip Replacement with staph infection in joint and is currently being treated for C-Diff.  She wanted to see if any follow-up was needed post treatment for C-Diff.

## 2014-12-11 NOTE — Telephone Encounter (Signed)
Called patient at this time. She verified understanding of testing that she would need. States that if this is not done at the facility that she is staying at, then she will call our office to get this ordered.

## 2014-12-12 DIAGNOSIS — F329 Major depressive disorder, single episode, unspecified: Secondary | ICD-10-CM | POA: Diagnosis not present

## 2014-12-12 DIAGNOSIS — T8451XA Infection and inflammatory reaction due to internal right hip prosthesis, initial encounter: Secondary | ICD-10-CM | POA: Diagnosis not present

## 2014-12-12 DIAGNOSIS — K5900 Constipation, unspecified: Secondary | ICD-10-CM | POA: Diagnosis not present

## 2014-12-14 DIAGNOSIS — T8451XD Infection and inflammatory reaction due to internal right hip prosthesis, subsequent encounter: Secondary | ICD-10-CM | POA: Diagnosis not present

## 2014-12-14 DIAGNOSIS — I10 Essential (primary) hypertension: Secondary | ICD-10-CM | POA: Diagnosis not present

## 2014-12-14 DIAGNOSIS — K5900 Constipation, unspecified: Secondary | ICD-10-CM | POA: Diagnosis not present

## 2014-12-14 DIAGNOSIS — D649 Anemia, unspecified: Secondary | ICD-10-CM | POA: Diagnosis not present

## 2014-12-18 DIAGNOSIS — T814XXA Infection following a procedure, initial encounter: Secondary | ICD-10-CM | POA: Diagnosis not present

## 2014-12-18 DIAGNOSIS — T8451XA Infection and inflammatory reaction due to internal right hip prosthesis, initial encounter: Secondary | ICD-10-CM | POA: Diagnosis not present

## 2014-12-20 DIAGNOSIS — T8451XA Infection and inflammatory reaction due to internal right hip prosthesis, initial encounter: Secondary | ICD-10-CM | POA: Diagnosis not present

## 2014-12-20 DIAGNOSIS — M009 Pyogenic arthritis, unspecified: Secondary | ICD-10-CM | POA: Diagnosis not present

## 2014-12-20 DIAGNOSIS — B999 Unspecified infectious disease: Secondary | ICD-10-CM | POA: Diagnosis not present

## 2014-12-20 DIAGNOSIS — A419 Sepsis, unspecified organism: Secondary | ICD-10-CM | POA: Diagnosis not present

## 2014-12-24 DIAGNOSIS — T8451XA Infection and inflammatory reaction due to internal right hip prosthesis, initial encounter: Secondary | ICD-10-CM | POA: Diagnosis not present

## 2014-12-24 DIAGNOSIS — Z7689 Persons encountering health services in other specified circumstances: Secondary | ICD-10-CM | POA: Diagnosis not present

## 2014-12-31 DIAGNOSIS — Z7689 Persons encountering health services in other specified circumstances: Secondary | ICD-10-CM | POA: Diagnosis not present

## 2014-12-31 DIAGNOSIS — Z5181 Encounter for therapeutic drug level monitoring: Secondary | ICD-10-CM | POA: Diagnosis not present

## 2014-12-31 DIAGNOSIS — T8451XA Infection and inflammatory reaction due to internal right hip prosthesis, initial encounter: Secondary | ICD-10-CM | POA: Diagnosis not present

## 2015-01-01 DIAGNOSIS — T814XXA Infection following a procedure, initial encounter: Secondary | ICD-10-CM | POA: Diagnosis not present

## 2015-01-07 DIAGNOSIS — Z7689 Persons encountering health services in other specified circumstances: Secondary | ICD-10-CM | POA: Diagnosis not present

## 2015-01-07 DIAGNOSIS — T8451XA Infection and inflammatory reaction due to internal right hip prosthesis, initial encounter: Secondary | ICD-10-CM | POA: Diagnosis not present

## 2015-01-10 DIAGNOSIS — M009 Pyogenic arthritis, unspecified: Secondary | ICD-10-CM | POA: Diagnosis not present

## 2015-01-10 DIAGNOSIS — B999 Unspecified infectious disease: Secondary | ICD-10-CM | POA: Diagnosis not present

## 2015-01-10 DIAGNOSIS — A419 Sepsis, unspecified organism: Secondary | ICD-10-CM | POA: Diagnosis not present

## 2015-01-14 ENCOUNTER — Inpatient Hospital Stay: Payer: Self-pay | Admitting: Infectious Disease

## 2015-01-14 DIAGNOSIS — T8451XA Infection and inflammatory reaction due to internal right hip prosthesis, initial encounter: Secondary | ICD-10-CM | POA: Diagnosis not present

## 2015-01-14 DIAGNOSIS — D649 Anemia, unspecified: Secondary | ICD-10-CM | POA: Diagnosis not present

## 2015-01-14 DIAGNOSIS — Z96651 Presence of right artificial knee joint: Secondary | ICD-10-CM | POA: Diagnosis not present

## 2015-01-14 DIAGNOSIS — Z7689 Persons encountering health services in other specified circumstances: Secondary | ICD-10-CM | POA: Diagnosis not present

## 2015-01-16 DIAGNOSIS — T814XXA Infection following a procedure, initial encounter: Secondary | ICD-10-CM | POA: Diagnosis not present

## 2015-01-21 DIAGNOSIS — D530 Protein deficiency anemia: Secondary | ICD-10-CM | POA: Diagnosis not present

## 2015-01-21 DIAGNOSIS — Z7689 Persons encountering health services in other specified circumstances: Secondary | ICD-10-CM | POA: Diagnosis not present

## 2015-01-21 DIAGNOSIS — T8451XA Infection and inflammatory reaction due to internal right hip prosthesis, initial encounter: Secondary | ICD-10-CM | POA: Diagnosis not present

## 2015-01-28 ENCOUNTER — Other Ambulatory Visit: Payer: Self-pay | Admitting: *Deleted

## 2015-01-28 ENCOUNTER — Telehealth: Payer: Self-pay | Admitting: *Deleted

## 2015-01-28 DIAGNOSIS — D649 Anemia, unspecified: Secondary | ICD-10-CM | POA: Diagnosis not present

## 2015-01-28 DIAGNOSIS — Z792 Long term (current) use of antibiotics: Secondary | ICD-10-CM | POA: Diagnosis not present

## 2015-01-28 DIAGNOSIS — Z96649 Presence of unspecified artificial hip joint: Principal | ICD-10-CM

## 2015-01-28 DIAGNOSIS — F329 Major depressive disorder, single episode, unspecified: Secondary | ICD-10-CM | POA: Diagnosis not present

## 2015-01-28 DIAGNOSIS — M6281 Muscle weakness (generalized): Secondary | ICD-10-CM | POA: Diagnosis not present

## 2015-01-28 DIAGNOSIS — T8451XA Infection and inflammatory reaction due to internal right hip prosthesis, initial encounter: Secondary | ICD-10-CM | POA: Diagnosis not present

## 2015-01-28 DIAGNOSIS — F419 Anxiety disorder, unspecified: Secondary | ICD-10-CM | POA: Diagnosis not present

## 2015-01-28 DIAGNOSIS — Z5181 Encounter for therapeutic drug level monitoring: Secondary | ICD-10-CM | POA: Diagnosis not present

## 2015-01-28 DIAGNOSIS — G40909 Epilepsy, unspecified, not intractable, without status epilepticus: Secondary | ICD-10-CM | POA: Diagnosis not present

## 2015-01-28 DIAGNOSIS — Z96651 Presence of right artificial knee joint: Secondary | ICD-10-CM | POA: Diagnosis not present

## 2015-01-28 DIAGNOSIS — T8459XD Infection and inflammatory reaction due to other internal joint prosthesis, subsequent encounter: Secondary | ICD-10-CM

## 2015-01-28 DIAGNOSIS — Z452 Encounter for adjustment and management of vascular access device: Secondary | ICD-10-CM | POA: Diagnosis not present

## 2015-01-28 DIAGNOSIS — I10 Essential (primary) hypertension: Secondary | ICD-10-CM | POA: Diagnosis not present

## 2015-01-28 MED ORDER — RIFAMPIN 300 MG PO CAPS: 300.0000 mg | ORAL_CAPSULE | Freq: Two times a day (BID) | ORAL | Status: DC

## 2015-01-28 MED ORDER — CEPHALEXIN 500 MG PO CAPS: 500.0000 mg | ORAL_CAPSULE | Freq: Four times a day (QID) | ORAL | Status: DC

## 2015-01-28 NOTE — Telephone Encounter (Addendum)
Mary from [facility] called to get an order to pull PICC on this patient. Advised the patient missed her follow up 01/14/15 at which time we would have given the order. Asked to have labs faxed over so the doctor could make a decision. Labs received and given to Dr Tommy Medal to review. Per Dr Tommy Medal written order sent medication to pharmacy Keflex 500 mg 4 x daily and Rifampin 300 mg 2 x daily both with 11 refills and gave verbal to [facility] to pull PICC. Spoke with the patient and she is aware to pick up her medication and to keep her appt set for 02/18/15 as it is very important for her care to be seen by the doctor. She advised she understands and will be here.

## 2015-02-06 ENCOUNTER — Other Ambulatory Visit: Payer: Self-pay

## 2015-02-06 ENCOUNTER — Telehealth: Payer: Self-pay | Admitting: Gastroenterology

## 2015-02-06 DIAGNOSIS — R109 Unspecified abdominal pain: Secondary | ICD-10-CM

## 2015-02-06 MED ORDER — HYOSCYAMINE SULFATE 0.125 MG PO TABS: 0.1250 mg | ORAL_TABLET | Freq: Four times a day (QID) | ORAL | Status: DC | PRN

## 2015-02-06 NOTE — Telephone Encounter (Signed)
anaspac 1.25 mg Per patient pharmacy faxed request but they haven't received anything back. [place] [phone] Patient needs her medication asap

## 2015-02-06 NOTE — Telephone Encounter (Signed)
Pt requesting refill on levsin. Refill sent to pharmacy and pt notified this was done.

## 2015-02-08 ENCOUNTER — Other Ambulatory Visit (HOSPITAL_COMMUNITY): Payer: Self-pay | Admitting: Orthopedic Surgery

## 2015-02-18 ENCOUNTER — Encounter: Payer: Self-pay | Admitting: Infectious Diseases

## 2015-02-18 ENCOUNTER — Ambulatory Visit (INDEPENDENT_AMBULATORY_CARE_PROVIDER_SITE_OTHER): Payer: Medicare Other | Admitting: Infectious Diseases

## 2015-02-18 VITALS — BP 124/83 | HR 71 | Temp 97.8°F | Ht 61.0 in | Wt 180.0 lb

## 2015-02-18 DIAGNOSIS — T8459XD Infection and inflammatory reaction due to other internal joint prosthesis, subsequent encounter: Secondary | ICD-10-CM

## 2015-02-18 DIAGNOSIS — A047 Enterocolitis due to Clostridium difficile: Secondary | ICD-10-CM | POA: Diagnosis not present

## 2015-02-18 DIAGNOSIS — I10 Essential (primary) hypertension: Secondary | ICD-10-CM

## 2015-02-18 DIAGNOSIS — A0472 Enterocolitis due to Clostridium difficile, not specified as recurrent: Secondary | ICD-10-CM

## 2015-02-18 DIAGNOSIS — Z96649 Presence of unspecified artificial hip joint: Secondary | ICD-10-CM

## 2015-02-18 LAB — CBC
HEMATOCRIT: 33 % — AB (ref 36.0–46.0)
Hemoglobin: 10.8 g/dL — ABNORMAL LOW (ref 12.0–15.0)
MCH: 26.1 pg (ref 26.0–34.0)
MCHC: 32.7 g/dL (ref 30.0–36.0)
MCV: 79.7 fL (ref 78.0–100.0)
MPV: 8.6 fL (ref 8.6–12.4)
PLATELETS: 236 10*3/uL (ref 150–400)
RBC: 4.14 MIL/uL (ref 3.87–5.11)
RDW: 13.4 % (ref 11.5–15.5)
WBC: 7.7 10*3/uL (ref 4.0–10.5)

## 2015-02-18 LAB — COMPREHENSIVE METABOLIC PANEL
ALBUMIN: 4.2 g/dL (ref 3.6–5.1)
ALT: 15 U/L (ref 6–29)
AST: 16 U/L (ref 10–35)
Alkaline Phosphatase: 98 U/L (ref 33–130)
BILIRUBIN TOTAL: 0.4 mg/dL (ref 0.2–1.2)
BUN: 14 mg/dL (ref 7–25)
CALCIUM: 9.8 mg/dL (ref 8.6–10.4)
CHLORIDE: 96 mmol/L — AB (ref 98–110)
CO2: 28 mmol/L (ref 20–31)
CREATININE: 0.77 mg/dL (ref 0.50–1.05)
GLUCOSE: 124 mg/dL — AB (ref 65–99)
Potassium: 4.4 mmol/L (ref 3.5–5.3)
SODIUM: 133 mmol/L — AB (ref 135–146)
Total Protein: 7.3 g/dL (ref 6.1–8.1)

## 2015-02-18 LAB — C-REACTIVE PROTEIN: CRP: 4.3 mg/dL — ABNORMAL HIGH (ref <0.60)

## 2015-02-18 LAB — SEDIMENTATION RATE: Sed Rate: 45 mm/hr — ABNORMAL HIGH (ref 0–30)

## 2015-02-18 NOTE — Assessment & Plan Note (Signed)
Resolved, will leave on problem list as a reminder of her risk.

## 2015-02-18 NOTE — Assessment & Plan Note (Signed)
Well controlled today.

## 2015-02-18 NOTE — Progress Notes (Signed)
   Subjective:    Patient ID: Christina French, female    DOB: 1956-02-15, 59 y.o.   MRN: [id number]  HPI 59 yo F with hx of R THR 2007.  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  She then fel the acute onset of R hip pain and 1 month later came to [facility] after developing fever to 103, WBC 22k. She was adm on 7-30, had aspirate of a fluid collection at her R hip which grew MSSA.  She was taken to OR on 8-3 and had I & D and placement of anbx beads. She was placed on ancef-rifampin with the plan for her to complete 8 weeks of therapy. Her course was also complicated by C diff.  Today she states that she still on the rifampin and keflex. On 11-22, she will have new THR.    8-2 7-29  ESR 135 33 CRP 37.2 15.5  Wt bearing is "ok", comes and goes. Some days pain more than others. occas uses cain/walker. Wound is well healed.   PMHx, Sochx, Fhx  reviewed and updated in EPIC.   Review of Systems  Constitutional: Negative for fever and chills.  HENT: Negative for trouble swallowing.   Gastrointestinal: Negative for diarrhea and constipation.  Genitourinary: Negative for difficulty urinating.  Please see HPI. 12 point ROS o/w (-)      Objective:   Physical Exam  Constitutional: She appears well-developed and well-nourished.  HENT:  Mouth/Throat: No oropharyngeal exudate.  Eyes: EOM are normal. Pupils are equal, round, and reactive to light.  Neck: Neck supple.  Cardiovascular: Normal rate, regular rhythm and normal heart sounds.   Pulmonary/Chest: Effort normal and breath sounds normal.  Abdominal: Soft. Bowel sounds are normal. There is no tenderness. There is no rebound.  Musculoskeletal:       Legs: Lymphadenopathy:    She has no cervical adenopathy.      Assessment & Plan:

## 2015-02-18 NOTE — Assessment & Plan Note (Addendum)
MSSA Will recheck her ESR and CRP, I explained these to her.  Will plan on her having her joint replacement in a few weeks.  Continue her po anbx post-op as her operative findings dictacte, if clean- shorter course. If evidence of persistent infection, consider repeat course of IV.  She refuses flu shot Will check her LFTs as well.

## 2015-03-08 ENCOUNTER — Encounter (HOSPITAL_COMMUNITY)
Admission: RE | Admit: 2015-03-08 | Discharge: 2015-03-08 | Disposition: A | Discharge status: Home / Self Care | Payer: Medicare Other | Source: Ambulatory Visit | Attending: Orthopedic Surgery | Admitting: Orthopedic Surgery

## 2015-03-08 ENCOUNTER — Encounter (HOSPITAL_COMMUNITY): Payer: Self-pay

## 2015-03-08 DIAGNOSIS — Z0183 Encounter for blood typing: Secondary | ICD-10-CM | POA: Insufficient documentation

## 2015-03-08 DIAGNOSIS — Z01812 Encounter for preprocedural laboratory examination: Secondary | ICD-10-CM | POA: Insufficient documentation

## 2015-03-08 DIAGNOSIS — Z01818 Encounter for other preprocedural examination: Secondary | ICD-10-CM | POA: Insufficient documentation

## 2015-03-08 HISTORY — DX: Personal history of pneumonia (recurrent): Z87.01

## 2015-03-08 HISTORY — DX: Personal history of other diseases of the respiratory system: Z87.09

## 2015-03-08 HISTORY — DX: Unspecified osteoarthritis, unspecified site: M19.90

## 2015-03-08 LAB — CBC
HEMATOCRIT: 33.1 % — AB (ref 36.0–46.0)
Hemoglobin: 11 g/dL — ABNORMAL LOW (ref 12.0–15.0)
MCH: 26.3 pg (ref 26.0–34.0)
MCHC: 33.2 g/dL (ref 30.0–36.0)
MCV: 79.2 fL (ref 78.0–100.0)
Platelets: 178 10*3/uL (ref 150–400)
RBC: 4.18 MIL/uL (ref 3.87–5.11)
RDW: 13.4 % (ref 11.5–15.5)
WBC: 9.9 10*3/uL (ref 4.0–10.5)

## 2015-03-08 LAB — URINALYSIS, ROUTINE W REFLEX MICROSCOPIC
Bilirubin Urine: NEGATIVE
Glucose, UA: NEGATIVE mg/dL
Ketones, ur: NEGATIVE mg/dL
Nitrite: NEGATIVE
PROTEIN: NEGATIVE mg/dL
SPECIFIC GRAVITY, URINE: 1.022 (ref 1.005–1.030)
UROBILINOGEN UA: 0.2 mg/dL (ref 0.0–1.0)
pH: 5.5 (ref 5.0–8.0)

## 2015-03-08 LAB — URINE MICROSCOPIC-ADD ON

## 2015-03-08 LAB — BASIC METABOLIC PANEL
ANION GAP: 7 (ref 5–15)
BUN: 10 mg/dL (ref 6–20)
CHLORIDE: 94 mmol/L — AB (ref 101–111)
CO2: 29 mmol/L (ref 22–32)
Calcium: 9.6 mg/dL (ref 8.9–10.3)
Creatinine, Ser: 0.8 mg/dL (ref 0.44–1.00)
Glucose, Bld: 130 mg/dL — ABNORMAL HIGH (ref 65–99)
POTASSIUM: 3.6 mmol/L (ref 3.5–5.1)
SODIUM: 130 mmol/L — AB (ref 135–145)

## 2015-03-08 LAB — SURGICAL PCR SCREEN
MRSA, PCR: NEGATIVE
STAPHYLOCOCCUS AUREUS: NEGATIVE

## 2015-03-08 NOTE — Pre-Procedure Instructions (Signed)
    Christina French  03/08/2015      [facility] - [place], [place] [facility] [address] [place] [address] Phone: [phone] Fax: [phone]    Your procedure is scheduled on Tuesday, November 22nd, 2016.  Report to [facility] Admitting at 5:30 A.M.  Call this number if you have problems the morning of surgery:  [phone]   Remember:  Do not eat food or drink liquids after midnight.   Take these medicines the morning of surgery with A SIP OF WATER: Alprazolam (Xanax), Atenolol (Tenormin), Carbamazepine (Tegretol), Dexlansoprazole (Dexilant), Escitalopram (Lexapro), Ondansetron (Zofran) if needed.  7 days prior to surgery, stop taking the following: Aspirin, NSAIDS, Ibuprofen, Advil, Motrin, Aleve, Naproxen, BC's, Goody's, Fish oil, all herbal medications, and all vitamins.    Do not wear jewelry, make-up or nail polish.  Do not wear lotions, powders, or perfumes.  You may wear deodorant.  Do not shave 48 hours prior to surgery.    Do not bring valuables to the hospital.  [facility] is not responsible for any belongings or valuables.  Contacts, dentures or bridgework may not be worn into surgery.  Leave your suitcase in the car.  After surgery it may be brought to your room.  For patients admitted to the hospital, discharge time will be determined by your treatment team.  Patients discharged the day of surgery will not be allowed to drive home.   Special instructions:  See attached.   Please read over the following fact sheets that you were given. Pain Booklet, Coughing and Deep Breathing, Blood Transfusion Information, MRSA Information and Surgical Site Infection Prevention

## 2015-03-08 NOTE — Progress Notes (Signed)
Lab called and states that the type and screen will need to be redrawn on the day of surgery due to antibodies. Order was placed for type and screen.

## 2015-03-08 NOTE — Progress Notes (Signed)
PCP - Dr. Clayborn Bigness Cardiologist - denies  EKG - 03/08/15  CXR- 11/23/14  Echo - 11/2014 Stress test/cardiac cath - denies  Patient denies chest pain and shortness of breath at PAT appointment.

## 2015-03-08 NOTE — Progress Notes (Signed)
   03/08/15 1330  OBSTRUCTIVE SLEEP APNEA  Have you ever been diagnosed with sleep apnea through a sleep study? No  Do you snore loudly (loud enough to be heard through closed doors)?  1  Do you often feel tired, fatigued, or sleepy during the daytime (such as falling asleep during driving or talking to someone)? 1  Has anyone observed you stop breathing during your sleep? 1  Do you have, or are you being treated for high blood pressure? 1  BMI more than 35 kg/m2? 0  Age > 50 (1-yes) 1  Neck circumference greater than:Female 16 inches or larger, Female 17inches or larger? 0  Female Gender (Yes=1) 0  Obstructive Sleep Apnea Score 5

## 2015-03-09 LAB — URINE CULTURE: Culture: 2000

## 2015-03-12 ENCOUNTER — Other Ambulatory Visit (HOSPITAL_COMMUNITY): Payer: Self-pay | Admitting: Orthopedic Surgery

## 2015-03-14 NOTE — H&P (Signed)
TOTAL HIP REVISION ADMISSION H&P  Patient is admitted for right revision total hip arthroplasty.  Subjective:  Chief Complaint: right hip pain  HPI: Christina French, 59 y.o. 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 The indications for the revision total hip arthroplasty are history of total hip infection.  Onset of symptoms was gradual starting 1 years ago with gradually worsening course since that time.  Prior procedures on the right hip include arthroplasty.  Patient currently rates pain in the right hip at 4 out of 10 with activity.  There is History of infection in the right hip treated with multiple debridements approximately 6 weeks ago with subsequent treatment with IV antibiotics. Patient has evidence of Well-seated and well-positioned components without evidence for ostial lysis particularly in the acetabular region by imaging studies.  This condition presents safety issues increasing the risk of falls.  This patient has had previous metal-on-metal total hip replacement.  There may be current active infection.  Patient Active Problem List   Diagnosis Date Noted  . Septic arthritis ([place])   . Prosthetic hip infection ([place])   . Staphylococcus aureus infection   . Enteritis due to Clostridium difficile   . Screen for STD (sexually transmitted disease)   . Acute pain of right hip; hip infection 11/24/2014  . Right hip pain 11/24/2014  . Hyponatremia 11/24/2014  . Seizure ([place]) 08/17/2013  . Digestive disorder 08/17/2013  . BP (high blood pressure) 08/17/2013  . HLD (hyperlipidemia) 08/17/2013   Past Medical History  Diagnosis Date  . Hypertension   . Anemia   . Anxiety   . Depression   . Seizures ([place])      rare - last one 07/30/14 - was very anemic  . GERD (gastroesophageal reflux disease)   . IBS (irritable bowel syndrome)   . Barrett esophagus   . Vertigo     thinks related to sinus issues  . Cancer ([place])   . Renal insufficiency     reports acute kidney injury  . History of pneumonia   . History of bronchitis   . Arthritis     Past Surgical History  Procedure Laterality Date  . Abdominal hysterectomy    . Hemorrhoid surgery    . Joint replacement Right 2007    hip  . Cyst excision  2011    from throat  . Cesarean section    . Colonoscopy N/A 08/31/2014    Procedure: COLONOSCOPY;  Surgeon: Lucilla Lame, MD;  Location: [facility];  Service: Gastroenterology;  Laterality: N/A;  . Esophagogastroduodenoscopy N/A 08/31/2014    Procedure: ESOPHAGOGASTRODUODENOSCOPY (EGD);  Surgeon: Lucilla Lame, MD;  Location: [facility];  Service: Gastroenterology;  Laterality: N/A;  . Incision and drainage hip Right 11/25/2014    Procedure: IRRIGATION  AND DRAINAGEOF RIGHT HIP WITH PLACEMENT OF ANTIBIOUTIC BEADS;  Surgeon: Mcarthur Rossetti, MD;  Location: [facility];  Service: Orthopedics;  Laterality: Right;  . Incision and drainage hip Right 11/28/2014    Procedure: Repeat I&D Right Hip;  Surgeon: Mcarthur Rossetti, MD;  Location: [facility];  Service: Orthopedics;  Laterality: Right;    No prescriptions prior to admission   No Known Allergies  Social History  Substance Use Topics  . Smoking status:  Never Smoker   . Smokeless tobacco: Not on file  . Alcohol Use: No    Family History  Problem Relation Age of Onset  . Alcoholism Father   . Throat cancer Mother       Review of Systems  Constitutional: Negative.   HENT: Negative.   Eyes: Negative.   Respiratory: Negative.   Cardiovascular: Negative.   Gastrointestinal: Negative.   Genitourinary: Negative.   Musculoskeletal: Positive for joint pain.  Skin: Negative.   Neurological: Negative.   Endo/Heme/Allergies:  Negative.   Psychiatric/Behavioral: Negative.     Objective:  Physical Exam  Constitutional: She appears well-developed.  HENT:  Head: Normocephalic.  Eyes: Pupils are equal, round, and reactive to light.  Neck: Normal range of motion.  Cardiovascular: Normal rate.   Respiratory: Effort normal.  Neurological: She is alert.  Skin: Skin is warm.  Psychiatric: She has a normal mood and affect.   right hip demonstrates minimal pain with range of motion approximately equal leg lengths no real warmth or erythema around the incision no palpable areas of fluctuance around the incision ankle dorsiflexion plantar flexion intact pedal pulses intact  Vital signs in last 24 hours:     Labs:   Estimated body mass index is 36.96 kg/(m^2) as calculated from the following:   Height as of 11/23/14: 5\' 1"  (1.549 m).   Weight as of 11/23/14: 88.678 kg (195 lb 8 oz).  Imaging Review:  Plain radiographs demonstrate No degenerative joint disease of the right hip(s). There is evidence of loosening of the There is no evidence of loosening of the components particularly the femoral component.The bone quality appears to be good for age and reported activity level. There is no evidence of malposition.  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  The patient history, physical examination, clinical judgement of the provider and imaging studies are consistent with treated infection of the right hip(s), previous total hip arthroplasty. Revision total hip arthroplasty is deemed medically necessary. . The risks and benefits of total hip arthroplasty were presented and reviewed. The risks due to aseptic loosening, infection, stiffness, dislocation/subluxation,  thromboembolic complications and other imponderables were discussed.  The patient acknowledged the explanation, agreed to proceed with the plan and consent was signed or  Patient is being admitted for inpatient treatment for surgery, pain control, PT, OT, prophylactic antibiotics, VTE prophylaxis, progressive ambulation and ADL's and discharge planning. The patient is planning to be discharged home with home health services operative plan at this time is for operative exploration of the site with removal of acetabular component. Intraoperative cultures and pathologic examination of tissue will be obtained. If there is concern for active ongoing infection then Prostalac spacer will be placed. If not 1 stage revision will be performed. The stem may or may not be removed. Plan for continuation of IV antibiotics post surgery.

## 2015-03-18 MED ORDER — CHLORHEXIDINE GLUCONATE 4 % EX LIQD: 60.0000 mL | Freq: Once | CUTANEOUS | Status: DC

## 2015-03-18 MED ORDER — CEFAZOLIN SODIUM-DEXTROSE 2-3 GM-% IV SOLR
2.0000 g | INTRAVENOUS | Status: AC
  Administered 2015-03-19 (×2): 2 g via INTRAVENOUS
  Filled 2015-03-18: qty 50

## 2015-03-18 NOTE — Progress Notes (Signed)
Pt returned call states she is aware of time change.

## 2015-03-18 NOTE — Progress Notes (Signed)
Message left for pt to return call about time change. Pt to arrive at 1140 for a 1340 surgery time.

## 2015-03-19 ENCOUNTER — Inpatient Hospital Stay (HOSPITAL_COMMUNITY): Payer: Medicare Other

## 2015-03-19 ENCOUNTER — Encounter (HOSPITAL_COMMUNITY)
Admission: RE | Disposition: A | Discharge status: Skilled Nursing Facility | Payer: Self-pay | Source: Ambulatory Visit | Attending: Orthopedic Surgery

## 2015-03-19 ENCOUNTER — Inpatient Hospital Stay (HOSPITAL_COMMUNITY): Payer: Medicare Other | Admitting: Certified Registered Nurse Anesthetist

## 2015-03-19 ENCOUNTER — Encounter (HOSPITAL_COMMUNITY): Payer: Self-pay | Admitting: Surgery

## 2015-03-19 ENCOUNTER — Inpatient Hospital Stay (HOSPITAL_COMMUNITY)
Admission: RE | Admit: 2015-03-19 | Discharge: 2015-03-23 | DRG: 464 | Disposition: A | Discharge status: Skilled Nursing Facility | Payer: Medicare Other | Source: Ambulatory Visit | Attending: Orthopedic Surgery | Admitting: Orthopedic Surgery

## 2015-03-19 DIAGNOSIS — R0989 Other specified symptoms and signs involving the circulatory and respiratory systems: Secondary | ICD-10-CM | POA: Diagnosis present

## 2015-03-19 DIAGNOSIS — Z713 Dietary counseling and surveillance: Secondary | ICD-10-CM | POA: Diagnosis not present

## 2015-03-19 DIAGNOSIS — B999 Unspecified infectious disease: Secondary | ICD-10-CM | POA: Diagnosis not present

## 2015-03-19 DIAGNOSIS — M6281 Muscle weakness (generalized): Secondary | ICD-10-CM | POA: Diagnosis not present

## 2015-03-19 DIAGNOSIS — K589 Irritable bowel syndrome without diarrhea: Secondary | ICD-10-CM | POA: Diagnosis present

## 2015-03-19 DIAGNOSIS — G40909 Epilepsy, unspecified, not intractable, without status epilepticus: Secondary | ICD-10-CM | POA: Diagnosis not present

## 2015-03-19 DIAGNOSIS — Z96649 Presence of unspecified artificial hip joint: Secondary | ICD-10-CM

## 2015-03-19 DIAGNOSIS — R739 Hyperglycemia, unspecified: Secondary | ICD-10-CM | POA: Diagnosis not present

## 2015-03-19 DIAGNOSIS — Y831 Surgical operation with implant of artificial internal device as the cause of abnormal reaction of the patient, or of later complication, without mention of misadventure at the time of the procedure: Secondary | ICD-10-CM | POA: Diagnosis present

## 2015-03-19 DIAGNOSIS — M25551 Pain in right hip: Secondary | ICD-10-CM | POA: Diagnosis not present

## 2015-03-19 DIAGNOSIS — E785 Hyperlipidemia, unspecified: Secondary | ICD-10-CM | POA: Diagnosis present

## 2015-03-19 DIAGNOSIS — M009 Pyogenic arthritis, unspecified: Secondary | ICD-10-CM | POA: Diagnosis not present

## 2015-03-19 DIAGNOSIS — I959 Hypotension, unspecified: Secondary | ICD-10-CM | POA: Diagnosis not present

## 2015-03-19 DIAGNOSIS — F419 Anxiety disorder, unspecified: Secondary | ICD-10-CM | POA: Diagnosis present

## 2015-03-19 DIAGNOSIS — Z471 Aftercare following joint replacement surgery: Secondary | ICD-10-CM | POA: Diagnosis not present

## 2015-03-19 DIAGNOSIS — E222 Syndrome of inappropriate secretion of antidiuretic hormone: Secondary | ICD-10-CM | POA: Diagnosis not present

## 2015-03-19 DIAGNOSIS — N289 Disorder of kidney and ureter, unspecified: Secondary | ICD-10-CM | POA: Diagnosis present

## 2015-03-19 DIAGNOSIS — T8451XA Infection and inflammatory reaction due to internal right hip prosthesis, initial encounter: Principal | ICD-10-CM | POA: Diagnosis present

## 2015-03-19 DIAGNOSIS — Z96641 Presence of right artificial hip joint: Secondary | ICD-10-CM | POA: Diagnosis not present

## 2015-03-19 DIAGNOSIS — T8459XA Infection and inflammatory reaction due to other internal joint prosthesis, initial encounter: Secondary | ICD-10-CM | POA: Diagnosis not present

## 2015-03-19 DIAGNOSIS — G4700 Insomnia, unspecified: Secondary | ICD-10-CM | POA: Diagnosis not present

## 2015-03-19 DIAGNOSIS — E871 Hypo-osmolality and hyponatremia: Secondary | ICD-10-CM | POA: Diagnosis present

## 2015-03-19 DIAGNOSIS — Z6835 Body mass index (BMI) 35.0-35.9, adult: Secondary | ICD-10-CM

## 2015-03-19 DIAGNOSIS — B9561 Methicillin susceptible Staphylococcus aureus infection as the cause of diseases classified elsewhere: Secondary | ICD-10-CM | POA: Diagnosis not present

## 2015-03-19 DIAGNOSIS — M1990 Unspecified osteoarthritis, unspecified site: Secondary | ICD-10-CM | POA: Diagnosis present

## 2015-03-19 DIAGNOSIS — D62 Acute posthemorrhagic anemia: Secondary | ICD-10-CM | POA: Diagnosis not present

## 2015-03-19 DIAGNOSIS — M7989 Other specified soft tissue disorders: Secondary | ICD-10-CM | POA: Diagnosis not present

## 2015-03-19 DIAGNOSIS — K2270 Barrett's esophagus without dysplasia: Secondary | ICD-10-CM | POA: Diagnosis not present

## 2015-03-19 DIAGNOSIS — E876 Hypokalemia: Secondary | ICD-10-CM | POA: Diagnosis present

## 2015-03-19 DIAGNOSIS — A4901 Methicillin susceptible Staphylococcus aureus infection, unspecified site: Secondary | ICD-10-CM | POA: Diagnosis not present

## 2015-03-19 DIAGNOSIS — F329 Major depressive disorder, single episode, unspecified: Secondary | ICD-10-CM | POA: Diagnosis not present

## 2015-03-19 DIAGNOSIS — Z808 Family history of malignant neoplasm of other organs or systems: Secondary | ICD-10-CM | POA: Diagnosis not present

## 2015-03-19 DIAGNOSIS — Z8719 Personal history of other diseases of the digestive system: Secondary | ICD-10-CM | POA: Diagnosis not present

## 2015-03-19 DIAGNOSIS — M6588 Other synovitis and tenosynovitis, other site: Secondary | ICD-10-CM | POA: Diagnosis not present

## 2015-03-19 DIAGNOSIS — T8451XD Infection and inflammatory reaction due to internal right hip prosthesis, subsequent encounter: Secondary | ICD-10-CM | POA: Diagnosis not present

## 2015-03-19 DIAGNOSIS — I10 Essential (primary) hypertension: Secondary | ICD-10-CM | POA: Diagnosis present

## 2015-03-19 DIAGNOSIS — F411 Generalized anxiety disorder: Secondary | ICD-10-CM | POA: Diagnosis not present

## 2015-03-19 DIAGNOSIS — Y792 Prosthetic and other implants, materials and accessory orthopedic devices associated with adverse incidents: Secondary | ICD-10-CM | POA: Diagnosis present

## 2015-03-19 DIAGNOSIS — E860 Dehydration: Secondary | ICD-10-CM | POA: Diagnosis not present

## 2015-03-19 DIAGNOSIS — Z85820 Personal history of malignant melanoma of skin: Secondary | ICD-10-CM | POA: Diagnosis not present

## 2015-03-19 DIAGNOSIS — Z811 Family history of alcohol abuse and dependence: Secondary | ICD-10-CM | POA: Diagnosis not present

## 2015-03-19 DIAGNOSIS — Y92009 Unspecified place in unspecified non-institutional (private) residence as the place of occurrence of the external cause: Secondary | ICD-10-CM | POA: Diagnosis not present

## 2015-03-19 DIAGNOSIS — R7309 Other abnormal glucose: Secondary | ICD-10-CM | POA: Diagnosis not present

## 2015-03-19 DIAGNOSIS — K219 Gastro-esophageal reflux disease without esophagitis: Secondary | ICD-10-CM | POA: Diagnosis not present

## 2015-03-19 DIAGNOSIS — A419 Sepsis, unspecified organism: Secondary | ICD-10-CM | POA: Diagnosis not present

## 2015-03-19 DIAGNOSIS — Z419 Encounter for procedure for purposes other than remedying health state, unspecified: Secondary | ICD-10-CM

## 2015-03-19 DIAGNOSIS — T8459XS Infection and inflammatory reaction due to other internal joint prosthesis, sequela: Secondary | ICD-10-CM | POA: Diagnosis not present

## 2015-03-19 DIAGNOSIS — R2689 Other abnormalities of gait and mobility: Secondary | ICD-10-CM | POA: Diagnosis not present

## 2015-03-19 DIAGNOSIS — Z9889 Other specified postprocedural states: Secondary | ICD-10-CM | POA: Diagnosis not present

## 2015-03-19 HISTORY — PX: TOTAL HIP REVISION: SHX763

## 2015-03-19 LAB — BASIC METABOLIC PANEL
ANION GAP: 8 (ref 5–15)
BUN: 11 mg/dL (ref 6–20)
CHLORIDE: 92 mmol/L — AB (ref 101–111)
CO2: 28 mmol/L (ref 22–32)
Calcium: 9.3 mg/dL (ref 8.9–10.3)
Creatinine, Ser: 0.83 mg/dL (ref 0.44–1.00)
GFR calc Af Amer: >60 mL/min (ref >60)
GLUCOSE: 131 mg/dL — AB (ref 65–99)
POTASSIUM: 4.2 mmol/L (ref 3.5–5.1)
Sodium: 128 mmol/L — ABNORMAL LOW (ref 135–145)

## 2015-03-19 LAB — GRAM STAIN

## 2015-03-19 LAB — PREPARE RBC (CROSSMATCH)

## 2015-03-19 SURGERY — TOTAL HIP REVISION
Anesthesia: General | Site: Hip | Laterality: Right

## 2015-03-19 MED ORDER — TOBRAMYCIN SULFATE 1.2 G IJ SOLR
INTRAMUSCULAR | Status: DC | PRN
  Administered 2015-03-19 (×2): 2.4

## 2015-03-19 MED ORDER — MIDAZOLAM HCL 2 MG/2ML IJ SOLN
INTRAMUSCULAR | Status: AC
  Filled 2015-03-19: qty 2

## 2015-03-19 MED ORDER — TOBRAMYCIN SULFATE 1.2 G IJ SOLR
2.4000 g | INTRAMUSCULAR | Status: DC
  Filled 2015-03-19: qty 2.4

## 2015-03-19 MED ORDER — LACTATED RINGERS IV SOLN
INTRAVENOUS | Status: DC
  Administered 2015-03-19 (×3): via INTRAVENOUS

## 2015-03-19 MED ORDER — TRAZODONE HCL 50 MG PO TABS
50.0000 mg | ORAL_TABLET | Freq: Every day | ORAL | Status: DC
  Administered 2015-03-20 – 2015-03-22 (×3): 50 mg via ORAL
  Filled 2015-03-19 (×3): qty 1

## 2015-03-19 MED ORDER — PHENYLEPHRINE 40 MCG/ML (10ML) SYRINGE FOR IV PUSH (FOR BLOOD PRESSURE SUPPORT)
PREFILLED_SYRINGE | INTRAVENOUS | Status: AC
  Filled 2015-03-19: qty 10

## 2015-03-19 MED ORDER — TRANEXAMIC ACID 1000 MG/10ML IV SOLN
1000.0000 mg | INTRAVENOUS | Status: AC
  Administered 2015-03-19: 1000 mg via INTRAVENOUS
  Filled 2015-03-19: qty 10

## 2015-03-19 MED ORDER — GENTAMICIN SULFATE 40 MG/ML IJ SOLN
INTRAMUSCULAR | Status: AC
  Filled 2015-03-19: qty 4

## 2015-03-19 MED ORDER — EPHEDRINE SULFATE 50 MG/ML IJ SOLN
INTRAMUSCULAR | Status: DC | PRN
  Administered 2015-03-19: 5 mg via INTRAVENOUS
  Administered 2015-03-19: 10 mg via INTRAVENOUS
  Administered 2015-03-19: 5 mg via INTRAVENOUS
  Administered 2015-03-19: 10 mg via INTRAVENOUS

## 2015-03-19 MED ORDER — PROMETHAZINE HCL 25 MG/ML IJ SOLN
6.2500 mg | INTRAMUSCULAR | Status: DC | PRN
  Administered 2015-03-19: 6.25 mg via INTRAVENOUS

## 2015-03-19 MED ORDER — ROCURONIUM BROMIDE 100 MG/10ML IV SOLN
INTRAVENOUS | Status: DC | PRN
  Administered 2015-03-19: 20 mg via INTRAVENOUS
  Administered 2015-03-19: 50 mg via INTRAVENOUS
  Administered 2015-03-19 (×3): 10 mg via INTRAVENOUS

## 2015-03-19 MED ORDER — FENTANYL CITRATE (PF) 100 MCG/2ML IJ SOLN
INTRAMUSCULAR | Status: DC | PRN
  Administered 2015-03-19: 100 ug via INTRAVENOUS
  Administered 2015-03-19: 50 ug via INTRAVENOUS
  Administered 2015-03-19: 150 ug via INTRAVENOUS
  Administered 2015-03-19 (×2): 50 ug via INTRAVENOUS

## 2015-03-19 MED ORDER — PANTOPRAZOLE SODIUM 40 MG PO TBEC
40.0000 mg | DELAYED_RELEASE_TABLET | Freq: Every day | ORAL | Status: DC
  Administered 2015-03-20 – 2015-03-23 (×4): 40 mg via ORAL
  Filled 2015-03-19 (×4): qty 1

## 2015-03-19 MED ORDER — SODIUM CHLORIDE 0.9 % IR SOLN
Status: DC | PRN
  Administered 2015-03-19: 1000 mL
  Administered 2015-03-19: 3000 mL

## 2015-03-19 MED ORDER — MEPERIDINE HCL 25 MG/ML IJ SOLN: 6.2500 mg | INTRAMUSCULAR | Status: DC | PRN

## 2015-03-19 MED ORDER — LIDOCAINE HCL (CARDIAC) 20 MG/ML IV SOLN
INTRAVENOUS | Status: DC | PRN
  Administered 2015-03-19: 80 mg via INTRAVENOUS

## 2015-03-19 MED ORDER — ALBUMIN HUMAN 5 % IV SOLN
12.5000 g | Freq: Once | INTRAVENOUS | Status: AC
  Administered 2015-03-19: 12.5 g via INTRAVENOUS

## 2015-03-19 MED ORDER — MORPHINE SULFATE (PF) 4 MG/ML IV SOLN
4.0000 mg | INTRAVENOUS | Status: DC | PRN
  Administered 2015-03-20 – 2015-03-22 (×3): 4 mg via INTRAVENOUS
  Filled 2015-03-19 (×4): qty 1

## 2015-03-19 MED ORDER — HYDROMORPHONE HCL 1 MG/ML IJ SOLN
0.2500 mg | INTRAMUSCULAR | Status: DC | PRN
Start: 2015-03-19 — End: 2015-03-19
  Administered 2015-03-19 (×2): 0.5 mg via INTRAVENOUS

## 2015-03-19 MED ORDER — DEXAMETHASONE SODIUM PHOSPHATE 4 MG/ML IJ SOLN
INTRAMUSCULAR | Status: DC | PRN
  Administered 2015-03-19: 4 mg via INTRAVENOUS

## 2015-03-19 MED ORDER — ACETAMINOPHEN 650 MG RE SUPP: 650.0000 mg | Freq: Four times a day (QID) | RECTAL | Status: DC | PRN

## 2015-03-19 MED ORDER — RIVAROXABAN 10 MG PO TABS
10.0000 mg | ORAL_TABLET | Freq: Every day | ORAL | Status: DC
  Administered 2015-03-20 – 2015-03-22 (×3): 10 mg via ORAL
  Filled 2015-03-19 (×3): qty 1

## 2015-03-19 MED ORDER — OXYCODONE HCL 5 MG PO TABS
5.0000 mg | ORAL_TABLET | ORAL | Status: DC | PRN
  Administered 2015-03-19 – 2015-03-20 (×5): 10 mg via ORAL
  Administered 2015-03-20 – 2015-03-21 (×2): 5 mg via ORAL
  Administered 2015-03-21 – 2015-03-22 (×4): 10 mg via ORAL
  Administered 2015-03-22: 5 mg via ORAL
  Administered 2015-03-22 – 2015-03-23 (×4): 10 mg via ORAL
  Filled 2015-03-19 (×4): qty 2
  Filled 2015-03-19: qty 1
  Filled 2015-03-19 (×5): qty 2
  Filled 2015-03-19: qty 1
  Filled 2015-03-19 (×4): qty 2
  Filled 2015-03-19: qty 1
  Filled 2015-03-19: qty 2

## 2015-03-19 MED ORDER — PHENYLEPHRINE HCL 10 MG/ML IJ SOLN
10.0000 mg | INTRAVENOUS | Status: DC | PRN
  Administered 2015-03-19: 25 ug/min via INTRAVENOUS

## 2015-03-19 MED ORDER — ATENOLOL 50 MG PO TABS
50.0000 mg | ORAL_TABLET | Freq: Two times a day (BID) | ORAL | Status: DC
  Administered 2015-03-20: 50 mg via ORAL
  Filled 2015-03-19: qty 1

## 2015-03-19 MED ORDER — ALBUMIN HUMAN 5 % IV SOLN
INTRAVENOUS | Status: DC | PRN
  Administered 2015-03-19 (×2): via INTRAVENOUS

## 2015-03-19 MED ORDER — ONDANSETRON HCL 4 MG/2ML IJ SOLN
4.0000 mg | Freq: Four times a day (QID) | INTRAMUSCULAR | Status: DC | PRN
  Administered 2015-03-19 – 2015-03-20 (×2): 4 mg via INTRAVENOUS
  Filled 2015-03-19 (×3): qty 2

## 2015-03-19 MED ORDER — ALBUMIN HUMAN 5 % IV SOLN
INTRAVENOUS | Status: AC
  Filled 2015-03-19: qty 250

## 2015-03-19 MED ORDER — 0.9 % SODIUM CHLORIDE (POUR BTL) OPTIME
TOPICAL | Status: DC | PRN
  Administered 2015-03-19: 7000 mL

## 2015-03-19 MED ORDER — GLYCOPYRROLATE 0.2 MG/ML IJ SOLN
INTRAMUSCULAR | Status: AC
  Filled 2015-03-19: qty 3

## 2015-03-19 MED ORDER — VANCOMYCIN HCL 1000 MG IV SOLR
INTRAVENOUS | Status: AC
  Filled 2015-03-19: qty 6000

## 2015-03-19 MED ORDER — ESCITALOPRAM OXALATE 10 MG PO TABS
20.0000 mg | ORAL_TABLET | Freq: Every day | ORAL | Status: DC
  Administered 2015-03-20 – 2015-03-23 (×4): 20 mg via ORAL
  Filled 2015-03-19 (×4): qty 2

## 2015-03-19 MED ORDER — METOCLOPRAMIDE HCL 5 MG/ML IJ SOLN
5.0000 mg | Freq: Three times a day (TID) | INTRAMUSCULAR | Status: DC | PRN
  Administered 2015-03-20: 10 mg via INTRAVENOUS
  Filled 2015-03-19: qty 2

## 2015-03-19 MED ORDER — ALPRAZOLAM 0.5 MG PO TABS
1.0000 mg | ORAL_TABLET | Freq: Three times a day (TID) | ORAL | Status: DC | PRN
Start: 2015-03-19 — End: 2015-03-23
  Administered 2015-03-20 (×3): 1 mg via ORAL
  Filled 2015-03-19 (×3): qty 2

## 2015-03-19 MED ORDER — PHENYLEPHRINE HCL 10 MG/ML IJ SOLN
INTRAMUSCULAR | Status: DC | PRN
  Administered 2015-03-19 (×3): 120 ug via INTRAVENOUS

## 2015-03-19 MED ORDER — MIDAZOLAM HCL 2 MG/2ML IJ SOLN: 0.5000 mg | Freq: Once | INTRAMUSCULAR | Status: DC | PRN

## 2015-03-19 MED ORDER — GLYCOPYRROLATE 0.2 MG/ML IJ SOLN
INTRAMUSCULAR | Status: DC | PRN
  Administered 2015-03-19: 0.1 mg via INTRAVENOUS
  Administered 2015-03-19: .2 mg via INTRAVENOUS
  Administered 2015-03-19: .6 mg via INTRAVENOUS

## 2015-03-19 MED ORDER — ONDANSETRON HCL 4 MG/2ML IJ SOLN
INTRAMUSCULAR | Status: DC | PRN
  Administered 2015-03-19: 4 mg via INTRAVENOUS

## 2015-03-19 MED ORDER — ACETAMINOPHEN 325 MG PO TABS
650.0000 mg | ORAL_TABLET | Freq: Four times a day (QID) | ORAL | Status: DC | PRN
  Administered 2015-03-20 – 2015-03-21 (×2): 650 mg via ORAL
  Filled 2015-03-19 (×2): qty 2

## 2015-03-19 MED ORDER — NEOSTIGMINE METHYLSULFATE 10 MG/10ML IV SOLN
INTRAVENOUS | Status: AC
  Filled 2015-03-19: qty 1

## 2015-03-19 MED ORDER — VANCOMYCIN HCL 1000 MG IV SOLR
INTRAVENOUS | Status: AC
  Filled 2015-03-19: qty 1000

## 2015-03-19 MED ORDER — ONDANSETRON HCL 4 MG PO TABS
8.0000 mg | ORAL_TABLET | Freq: Two times a day (BID) | ORAL | Status: DC | PRN
Start: 2015-03-19 — End: 2015-03-19

## 2015-03-19 MED ORDER — TOBRAMYCIN SULFATE 1.2 G IJ SOLR
INTRAMUSCULAR | Status: AC
  Filled 2015-03-19: qty 2.4

## 2015-03-19 MED ORDER — CEFAZOLIN SODIUM-DEXTROSE 2-3 GM-% IV SOLR
2.0000 g | Freq: Four times a day (QID) | INTRAVENOUS | Status: AC
  Administered 2015-03-19 – 2015-03-20 (×3): 2 g via INTRAVENOUS
  Filled 2015-03-19 (×3): qty 50

## 2015-03-19 MED ORDER — NEOSTIGMINE METHYLSULFATE 10 MG/10ML IV SOLN
INTRAVENOUS | Status: DC | PRN
  Administered 2015-03-19: 3 mg via INTRAVENOUS

## 2015-03-19 MED ORDER — PROPOFOL 10 MG/ML IV BOLUS
INTRAVENOUS | Status: AC
  Filled 2015-03-19: qty 20

## 2015-03-19 MED ORDER — VANCOMYCIN HCL 1000 MG IV SOLR
INTRAVENOUS | Status: DC | PRN
  Administered 2015-03-19: 1 g

## 2015-03-19 MED ORDER — POTASSIUM CHLORIDE IN NACL 20-0.9 MEQ/L-% IV SOLN
INTRAVENOUS | Status: DC
  Administered 2015-03-19: 23:00:00 via INTRAVENOUS
  Filled 2015-03-19: qty 1000

## 2015-03-19 MED ORDER — HYDROMORPHONE HCL 1 MG/ML IJ SOLN
INTRAMUSCULAR | Status: AC
  Filled 2015-03-19: qty 1

## 2015-03-19 MED ORDER — ROCURONIUM BROMIDE 50 MG/5ML IV SOLN
INTRAVENOUS | Status: AC
  Filled 2015-03-19: qty 1

## 2015-03-19 MED ORDER — EPHEDRINE SULFATE 50 MG/ML IJ SOLN
INTRAMUSCULAR | Status: AC
  Filled 2015-03-19: qty 1

## 2015-03-19 MED ORDER — METHOCARBAMOL 500 MG PO TABS
500.0000 mg | ORAL_TABLET | Freq: Four times a day (QID) | ORAL | Status: DC | PRN
  Administered 2015-03-19 – 2015-03-23 (×6): 500 mg via ORAL
  Filled 2015-03-19 (×6): qty 1

## 2015-03-19 MED ORDER — MIRTAZAPINE 15 MG PO TABS
30.0000 mg | ORAL_TABLET | Freq: Every day | ORAL | Status: DC
Start: 2015-03-20 — End: 2015-03-23
  Administered 2015-03-20 – 2015-03-22 (×3): 30 mg via ORAL
  Filled 2015-03-19 (×3): qty 2

## 2015-03-19 MED ORDER — PROPOFOL 10 MG/ML IV BOLUS
INTRAVENOUS | Status: DC | PRN
  Administered 2015-03-19: 150 mg via INTRAVENOUS

## 2015-03-19 MED ORDER — FENTANYL CITRATE (PF) 250 MCG/5ML IJ SOLN
INTRAMUSCULAR | Status: AC
  Filled 2015-03-19: qty 5

## 2015-03-19 MED ORDER — METHOCARBAMOL 1000 MG/10ML IJ SOLN
500.0000 mg | Freq: Four times a day (QID) | INTRAVENOUS | Status: DC | PRN
  Filled 2015-03-19: qty 5

## 2015-03-19 MED ORDER — ONDANSETRON HCL 4 MG/2ML IJ SOLN
INTRAMUSCULAR | Status: AC
  Filled 2015-03-19: qty 2

## 2015-03-19 MED ORDER — METOCLOPRAMIDE HCL 5 MG PO TABS: 5.0000 mg | ORAL_TABLET | Freq: Three times a day (TID) | ORAL | Status: DC | PRN

## 2015-03-19 MED ORDER — SODIUM CHLORIDE 0.9 % IJ SOLN
INTRAMUSCULAR | Status: AC
  Filled 2015-03-19: qty 10

## 2015-03-19 MED ORDER — STERILE WATER FOR INJECTION IJ SOLN
INTRAMUSCULAR | Status: AC
  Filled 2015-03-19: qty 10

## 2015-03-19 MED ORDER — MIDAZOLAM HCL 5 MG/5ML IJ SOLN
INTRAMUSCULAR | Status: DC | PRN
  Administered 2015-03-19: 2 mg via INTRAVENOUS

## 2015-03-19 MED ORDER — SUCCINYLCHOLINE CHLORIDE 20 MG/ML IJ SOLN
INTRAMUSCULAR | Status: AC
  Filled 2015-03-19: qty 1

## 2015-03-19 MED ORDER — CARBAMAZEPINE 200 MG PO TABS
300.0000 mg | ORAL_TABLET | Freq: Two times a day (BID) | ORAL | Status: DC
  Administered 2015-03-19 – 2015-03-23 (×8): 300 mg via ORAL
  Filled 2015-03-19 (×8): qty 2

## 2015-03-19 MED ORDER — PROMETHAZINE HCL 25 MG/ML IJ SOLN
INTRAMUSCULAR | Status: AC
  Filled 2015-03-19: qty 1

## 2015-03-19 MED ORDER — GENTAMICIN SULFATE 40 MG/ML IJ SOLN
INTRAMUSCULAR | Status: DC | PRN
  Administered 2015-03-19: 160 mg

## 2015-03-19 MED ORDER — ARTIFICIAL TEARS OP OINT
TOPICAL_OINTMENT | OPHTHALMIC | Status: AC
  Filled 2015-03-19: qty 3.5

## 2015-03-19 MED ORDER — VANCOMYCIN HCL 1000 MG IV SOLR
INTRAVENOUS | Status: DC | PRN
  Administered 2015-03-19: 6 g

## 2015-03-19 MED ORDER — ONDANSETRON HCL 4 MG PO TABS: 4.0000 mg | ORAL_TABLET | Freq: Four times a day (QID) | ORAL | Status: DC | PRN

## 2015-03-19 MED ORDER — SODIUM CHLORIDE 0.9 % IV SOLN
Freq: Once | INTRAVENOUS | Status: AC
  Administered 2015-03-19: 18:00:00 via INTRAVENOUS

## 2015-03-19 MED ORDER — LIDOCAINE HCL (CARDIAC) 20 MG/ML IV SOLN
INTRAVENOUS | Status: AC
  Filled 2015-03-19: qty 5

## 2015-03-19 SURGICAL SUPPLY — 89 items
BLADE SAW RECIP 87.9 MT (BLADE) ×3 IMPLANT
BLADE SURG 10 STRL SS (BLADE) ×2 IMPLANT
BLADE SURG ROTATE 9660 (MISCELLANEOUS) IMPLANT
BONE CEMENT PALACOSE (Orthopedic Implant) ×6 IMPLANT
BRUSH FEMORAL CANAL (MISCELLANEOUS) ×1 IMPLANT
CABLE (Orthopedic Implant) ×4 IMPLANT
CABLE CERLAGE W/CRIMP 1.8 (Cable) ×2 IMPLANT
CABLE CERLAGE W/CRIMP 1.8MM (Cable) ×2 IMPLANT
CEMENT BONE PALACOSE (Orthopedic Implant) IMPLANT
CONT SPEC 4OZ CLIKSEAL STRL BL (MISCELLANEOUS) ×4 IMPLANT
COVER BACK TABLE 24X17X13 BIG (DRAPES) IMPLANT
DRAPE C-ARM 42X72 X-RAY (DRAPES) ×2 IMPLANT
DRAPE C-ARMOR (DRAPES) ×2 IMPLANT
DRAPE IMP U-DRAPE 54X76 (DRAPES) ×3 IMPLANT
DRAPE INCISE IOBAN 66X45 STRL (DRAPES) ×2 IMPLANT
DRAPE ORTHO SPLIT 77X108 STRL (DRAPES) ×6
DRAPE PROXIMA HALF (DRAPES) IMPLANT
DRAPE SURG 17X23 STRL (DRAPES) ×2 IMPLANT
DRAPE SURG ORHT 6 SPLT 77X108 (DRAPES) ×2 IMPLANT
DRAPE U-SHAPE 47X51 STRL (DRAPES) ×3 IMPLANT
DRILL BIT 7/64X5 (BIT) ×3 IMPLANT
DRSG MEPILEX BORDER 4X12 (GAUZE/BANDAGES/DRESSINGS) ×3 IMPLANT
DURAPREP 26ML APPLICATOR (WOUND CARE) ×3 IMPLANT
ELECT BLADE 6.5 EXT (BLADE) IMPLANT
ELECT CAUTERY BLADE 6.4 (BLADE) ×3 IMPLANT
ELECT REM PT RETURN 9FT ADLT (ELECTROSURGICAL) ×3
ELECTRODE REM PT RTRN 9FT ADLT (ELECTROSURGICAL) ×1 IMPLANT
EVACUATOR 1/8 PVC DRAIN (DRAIN) IMPLANT
FACESHIELD WRAPAROUND (MASK) ×3 IMPLANT
FACESHIELD WRAPAROUND OR TEAM (MASK) ×1 IMPLANT
GAUZE XEROFORM 5X9 LF (GAUZE/BANDAGES/DRESSINGS) ×2 IMPLANT
GLOVE BIO SURGEON ST LM GN SZ9 (GLOVE) ×3 IMPLANT
GLOVE BIOGEL PI IND STRL 8 (GLOVE) ×1 IMPLANT
GLOVE BIOGEL PI INDICATOR 8 (GLOVE) ×4
GLOVE ECLIPSE 6.5 STRL STRAW (GLOVE) ×2 IMPLANT
GLOVE SURG ORTHO 8.0 STRL STRW (GLOVE) ×5 IMPLANT
GOWN STRL REUS W/ TWL LRG LVL3 (GOWN DISPOSABLE) ×1 IMPLANT
GOWN STRL REUS W/ TWL XL LVL3 (GOWN DISPOSABLE) ×1 IMPLANT
GOWN STRL REUS W/TWL LRG LVL3 (GOWN DISPOSABLE) ×3
GOWN STRL REUS W/TWL XL LVL3 (GOWN DISPOSABLE) ×3
GUIDEWIRE BALL NOSE 80CM (WIRE) ×2 IMPLANT
HANDPIECE INTERPULSE COAX TIP (DISPOSABLE) ×3
HEAD FEM STD 32X+13 STRL (Hips) ×2 IMPLANT
HOOD PEEL AWAY FACE SHEILD DIS (HOOD) ×7 IMPLANT
IMMOBILIZER KNEE 20 (SOFTGOODS) IMPLANT
IMMOBILIZER KNEE 22 UNIV (SOFTGOODS) ×2 IMPLANT
IMMOBILIZER KNEE 24 THIGH 36 (MISCELLANEOUS) IMPLANT
IMMOBILIZER KNEE 24 UNIV (MISCELLANEOUS)
KIT BASIN OR (CUSTOM PROCEDURE TRAY) ×3 IMPLANT
KIT ROOM TURNOVER OR (KITS) ×3 IMPLANT
KIT STIMULAN RAPID CURE  10CC (Orthopedic Implant) ×2 IMPLANT
KIT STIMULAN RAPID CURE 10CC (Orthopedic Implant) IMPLANT
LINER ACET CUP 42MMX32MM (Hips) ×2 IMPLANT
MANIFOLD NEPTUNE II (INSTRUMENTS) ×3 IMPLANT
NDL HYPO 25GX1X1/2 BEV (NEEDLE) IMPLANT
NDL SUT .5 MAYO 1.404X.05X (NEEDLE) ×1 IMPLANT
NEEDLE HYPO 25GX1X1/2 BEV (NEEDLE) IMPLANT
NEEDLE MAYO TAPER (NEEDLE) ×3
NS IRRIG 1000ML POUR BTL (IV SOLUTION) ×15 IMPLANT
PACK TOTAL JOINT (CUSTOM PROCEDURE TRAY) ×3 IMPLANT
PACK UNIVERSAL I (CUSTOM PROCEDURE TRAY) ×3 IMPLANT
PAD ARMBOARD 7.5X6 YLW CONV (MISCELLANEOUS) ×6 IMPLANT
PASSER SUT SWANSON 36MM LOOP (INSTRUMENTS) ×3 IMPLANT
PILLOW ABDUCTION HIP (SOFTGOODS) IMPLANT
PIN STEINMAN 3/16 (PIN) IMPLANT
PRESSURIZER FEMORAL UNIV (MISCELLANEOUS) IMPLANT
SEALER BIPOLAR AQUA 6.0 (INSTRUMENTS) ×2 IMPLANT
SET HNDPC FAN SPRY TIP SCT (DISPOSABLE) ×1 IMPLANT
SPONGE LAP 18X18 X RAY DECT (DISPOSABLE) ×6 IMPLANT
SPONGE LAP 4X18 X RAY DECT (DISPOSABLE) IMPLANT
STAPLER VISISTAT 35W (STAPLE) ×3 IMPLANT
STEM CEMENTED SUMMIT SZ2 (Stem) ×2 IMPLANT
SUCTION FRAZIER TIP 10 FR DISP (SUCTIONS) ×3 IMPLANT
SUT ETHIBOND NAB CT1 #1 30IN (SUTURE) ×12 IMPLANT
SUT VIC AB 0 CT1 27 (SUTURE) ×18
SUT VIC AB 0 CT1 27XBRD ANBCTR (SUTURE) ×4 IMPLANT
SUT VIC AB 1 CT1 27 (SUTURE) ×15
SUT VIC AB 1 CT1 27XBRD ANBCTR (SUTURE) ×6 IMPLANT
SUT VIC AB 2-0 CT1 27 (SUTURE) ×12
SUT VIC AB 2-0 CT1 TAPERPNT 27 (SUTURE) ×4 IMPLANT
SWAB COLLECTION DEVICE MRSA (MISCELLANEOUS) ×6 IMPLANT
SWAB CULTURE ESWAB REG 1ML (MISCELLANEOUS) ×6 IMPLANT
SYR CONTROL 10ML LL (SYRINGE) IMPLANT
TOWEL OR 17X24 6PK STRL BLUE (TOWEL DISPOSABLE) ×3 IMPLANT
TOWEL OR 17X26 10 PK STRL BLUE (TOWEL DISPOSABLE) ×3 IMPLANT
TOWER CARTRIDGE SMART MIX (DISPOSABLE) ×4 IMPLANT
TRAY FOLEY CATH 16FRSI W/METER (SET/KITS/TRAYS/PACK) ×2 IMPLANT
WATER STERILE IRR 1000ML POUR (IV SOLUTION) ×4 IMPLANT
YANKAUER SUCT BULB TIP NO VENT (SUCTIONS) ×2 IMPLANT

## 2015-03-19 NOTE — Anesthesia Postprocedure Evaluation (Signed)
Anesthesia Post Note  Patient: Christina French  Procedure(s) Performed: Procedure(s) (LRB): RIGHT TOTAL HIP ARTHROPLASTY REVISION (Right)  Patient location during evaluation: PACU Anesthesia Type: General Level of consciousness: sedated Pain management: pain level controlled Vital Signs Assessment: post-procedure vital signs reviewed and stable Respiratory status: spontaneous breathing and respiratory function stable Cardiovascular status: stable Anesthetic complications: no    Last Vitals:  Filed Vitals:   03/19/15 2030 03/19/15 2045  BP: 89/55 93/56  Pulse: 70 71  Temp:    Resp: 18 15    Last Pain:  Filed Vitals:   03/19/15 2050  PainSc: [place]

## 2015-03-19 NOTE — Brief Op Note (Signed)
03/19/2015  8:02 PM  PATIENT:  Christina French  59 y.o. female  PRE-OPERATIVE DIAGNOSIS:  RIGHT TOTAL HIP ARHTROPLASTY PSEUDO TUMOR  POST-OPERATIVE DIAGNOSIS:  RIGHT TOTAL HIP ARHTROPLASTY PSEUDO tumor and infection  PROCEDURE:  Procedure(s): RIGHT TOTAL HIP ARTHROPLASTY removal and placement of abx spacer  SURGEON:  Surgeon(s): Meredith Pel, MD Georga Kaufmann Ephriam Jenkins, MD  ASSISTANT: Laure Kidney rnfa  ANESTHESIA:   general  EBL: 500 ml    Total I/O In: 550 [I.V.:300; IV Piggyback:250] Out: 50 [Urine:50]  BLOOD ADMINISTERED: none  DRAINS: none   LOCAL MEDICATIONS USED:  none  SPECIMEN:  fliud and tissue to path and micro - inter op positive for acute inflammation  COUNTS:  YES  TOURNIQUET:  * No tourniquets in log *  DICTATION: .Other Dictation: Dictation Number [id number]  PLAN OF CARE: Admit to inpatient   PATIENT DISPOSITION:  PACU - hemodynamically stable

## 2015-03-19 NOTE — Anesthesia Preprocedure Evaluation (Addendum)
Anesthesia Evaluation  Patient identified by MRN, date of birth, ID band Patient awake    Reviewed: Allergy & Precautions, NPO status , Patient's Chart, lab work & pertinent test results  History of Anesthesia Complications Negative for: history of anesthetic complications  Airway Mallampati: II  TM Distance: >3 FB Neck ROM: Full    Dental  (+) Dental Advisory Given   Pulmonary neg pulmonary ROS,    breath sounds clear to auscultation       Cardiovascular hypertension, Pt. on medications and Pt. on home beta blockers (-) angina Rhythm:Regular Rate:Normal  8/16 ECHO: EF 60-65%, valves OK   Neuro/Psych Seizures -, Well Controlled,  Anxiety Depression    GI/Hepatic Neg liver ROS, GERD  Medicated and Poorly Controlled,  Endo/Other  Morbid obesity  Renal/GU Renal InsufficiencyRenal disease     Musculoskeletal  (+) Arthritis , Osteoarthritis,    Abdominal (+) + obese,   Peds  Hematology negative hematology ROS (+)   Anesthesia Other Findings   Reproductive/Obstetrics                            Anesthesia Physical Anesthesia Plan  ASA: III  Anesthesia Plan: General   Post-op Pain Management:    Induction: Intravenous and Rapid sequence  Airway Management Planned: Oral ETT  Additional Equipment:   Intra-op Plan:   Post-operative Plan: Extubation in OR  Informed Consent: I have reviewed the patients History and Physical, chart, labs and discussed the procedure including the risks, benefits and alternatives for the proposed anesthesia with the patient or authorized representative who has indicated his/her understanding and acceptance.   Dental advisory given  Plan Discussed with: CRNA and Surgeon  Anesthesia Plan Comments: (Plan routine monitors, GETA)        Anesthesia Quick Evaluation

## 2015-03-19 NOTE — Transfer of Care (Signed)
Immediate Anesthesia Transfer of Care Note  Patient: Christina French  Procedure(s) Performed: Procedure(s): RIGHT TOTAL HIP ARTHROPLASTY REVISION (Right)  Patient Location: PACU  Anesthesia Type:General  Level of Consciousness: awake, alert  and oriented  Airway & Oxygen Therapy: Patient Spontanous Breathing and Patient connected to face mask oxygen  Post-op Assessment: Report given to RN and Post -op Vital signs reviewed and stable  Post vital signs: Reviewed and stable  Last Vitals:  Filed Vitals:   03/19/15 1201 03/19/15 1949  BP: 116/71 112/80  Pulse: 76   Temp: 36.9 C 36.6 C  Resp: 18 22    Complications: No apparent anesthesia complications

## 2015-03-19 NOTE — Interval H&P Note (Signed)
History and Physical Interval Note:  03/19/2015 2:08 PM  Christina French  has presented today for surgery, with the diagnosis of RIGHT TOTAL HIP ARHTROPLASTY PSEUDO TUMOR  The various methods of treatment have been discussed with the patient and family. After consideration of risks, benefits and other options for treatment, the patient has consented to  Procedure(s): TOTAL HIP REVISION (Right) as a surgical intervention .  The patient's history has been reviewed, patient examined, no change in status, stable for surgery.  I have reviewed the patient's chart and labs.  Questions were answered to the patient's satisfaction.    Christina French presents today for right hip surgery. She had an infected pseudotumor back in July. This was washed out on 2 occasions by Dr. Ninfa Linden. She's been on antibiotics since that time. Her infection lab #been trending down with sedimentation rates 45 C-reactive protein 4.3 less than a month ago. She's having no fevers and chills. She has retained her original components up to this point. Plan today is for right hip revision with removal of monobloc metal shell acetabulum. Also plan to obtain intraoperative cultures and white count to look for cells per high-powered field to determine whether or not to proceed with femoral implant removal (if possible) and placement of antibiotic spacer versus revision to a new cup with placement of antibiotic beads. A lot will depend upon how the operative field appears at the time of surgery. All questions answered.   DEAN,GREGORY SCOTT

## 2015-03-19 NOTE — Op Note (Signed)
NAMEALEAYA, TRACHTMAN NO.:  [id number]  MEDICAL RECORD NO.:  [id number]  LOCATION:  5N12C                        FACILITY:  [facility]  PHYSICIAN:  Anderson Malta, M.D.    DATE OF BIRTH:  Mar 06, 1956  DATE OF PROCEDURE: DATE OF DISCHARGE:                              OPERATIVE REPORT   PREOPERATIVE DIAGNOSIS:  Right hip infection.  POSTOPERATIVE DIAGNOSIS:  Right hip infection with failed metal-on-metal total hip replacement.  PROCEDURES:  Right hip removal of prosthesis, extensive irrigation, excisional debridement, placement of antibiotic spacer, Prostalac and placement of antibiotic beads with intraoperative cultures confirming acute infection.  SURGEON:  Anderson Malta, M.D.  ASSISTANT:  Laure Kidney, RNFA and Dimas Alexandria, MD  ESTIMATED BLOOD LOSS:  500.  PACKED RED BLOOD CELLS:  X1 given in surgery.  SPECIMENS:  Specimens sent as well, which did show acute inflammation.  PROCEDURE IN DETAIL:  The patient was brought to the operating room where general anesthetic was induced.  Preoperative IV antibiotics were maintained.  The patient was placed in lateral decubitus position with left axilla and left peroneal nerve well padded.  Skin area was prescrubbed with alcohol and Betadine, allowed to air dry, prepped with DuraPrep solution and draped in a sterile manner.  Charlie Pitter was used to cover the operative field.  Posterior approach to the hip was made. Skin and subcutaneous tissue were sharply divided.  Significant scar tissue was present throughout the dissection.  Iliotibial band was identified and incised.  Plane was developed between the trochanter and the gluteus maximus muscle.  Incision extended proximally and distally. Charnley retractor placed.  There was basically a pocket of fluid that was present in and around the lesser trochanter.  This fluid appeared infected and was sent for pathologic analysis and did show acute inflammation.  Decision at  that time was made to remove all components as opposed to doing one-stage revision.  At this time, muscle was split proximally, significant scarring was present, but the sciatic nerve was palpated proximally up around the sciatic notch region.  It did delving the significant scar tissue distal to that area.  For that reason, great care was taken to avoid resection of scar tissue, but enough tissue was resected in order to needed to be visualized.  Capsulotomy was performed.  The patient did have evidence of bony erosion around the sleeve itself.  Following capsulotomy around the posterior face of the acetabulum, anterior dissection was performed, not much way of metal-on- metal wear was present, but there was acutely inflamed-appearing tissue. The only frank purulence that was encountered within that initial specimen, which had a pseudosheath-type encapsulation around it.  No dark fluid was encountered around the prosthesis.  At this time, the hip was dislocated.  The stem was actually removed.  The sleeve was somewhat proud.  Flexible osteotomes were then used to disrupt the interface between the spleen and the bone.  There was significant bony erosion and some bone loss proximally in the femur.  In the process of removing the stem, the greater trochanter did crack in an extended transtrochanteric osteotomy-type fashion.  There was a pedestal bone distally, which required reaming  up to 10 mm in order to allow for placement of a cement- covered antibiotic laden prosthesis.  At this time, the attention was then directed towards the acetabular side.  The explant was utilized and the socket was removed.  The socket was not overly loose, but did not have much bony ingrowth.  There was essentially no bone loss with the acetabular extraction.  At this time, thorough irrigation and excisional debridement was performed around the acetabulum both around the rim as well as inside.  A thorough  irrigation was then performed after excisional debridement using about 9 liters of irrigating solution.  At this time, the femur had been reamed, past the pedestal using guidepin to 10 mm in order to facilitate placement of a Prostalac [place] stem covered with antibiotic impregnated cement.  Cement was mixed in one batch, two packages, 6 g of vancomycin and 4 g of tobra.  We used about half of that cement for both the acetabulum and the femur.  The cement ball was placed within the socket.  This was allowed to harden with a polyethylene liner in position.  The stem was then placed into the femoral canal.  The trochanter was then reattached using two cables. Reduction was performed with a +13 ball.  This gave excellent restoration of leg lengths and good tension and stability in both flexion and internal and external rotation with minimal-to-no shock.  At this time, cables were used to reapproximate the greater trochanter back to the femur.  Antibiotic Stimulan beads 10 mL were then placed because the antibiotic cement Prostalac was essentially within the acetabulum itself as well as within the femur itself, but not much within the space of the joint.  This was filled with 10 mL of Stimulan beads.  The sciatic nerve was again palpated proximally.  Incision was then closed using #1 Vicryl suture, 0 Vicryl suture, and skin staples.  Leg lengths approximately equal at the conclusion of the case.  Toe dorsiflexion was observed in the OR upon extubation.  The patient tolerated the procedure well without immediate complication.  Dr. Woody Seller assistance required during the case for retraction and mobilization of tissues, dislocation, relocation as well as preparation of cement prosthesis.     Anderson Malta, M.D.     GSD/MEDQ  D:  03/19/2015  T:  03/19/2015  Job:  [id number]

## 2015-03-19 NOTE — Progress Notes (Signed)
Called Dr Tobias Alexander regarding low BP ranging from systolic of [id number] with diastolic of [id number]. Will administer 1 bottle of 5% albumin.

## 2015-03-19 NOTE — Progress Notes (Signed)
Pt requested that admission be done in the morning when she is more awake.

## 2015-03-19 NOTE — Anesthesia Procedure Notes (Signed)
Procedure Name: Intubation Date/Time: 03/19/2015 2:31 PM Performed by: Shirlyn Goltz Pre-anesthesia Checklist: Patient identified, Emergency Drugs available, Suction available and Patient being monitored Patient Re-evaluated:Patient Re-evaluated prior to inductionOxygen Delivery Method: Circle system utilized Preoxygenation: Pre-oxygenation with 100% oxygen Intubation Type: IV induction Ventilation: Mask ventilation without difficulty Laryngoscope Size: Mac and 3 Grade View: Grade I Tube type: Oral Tube size: 7.5 mm Number of attempts: 1 Airway Equipment and Method: Stylet Placement Confirmation: ETT inserted through vocal cords under direct vision,  positive ETCO2 and breath sounds checked- equal and bilateral Secured at: 22 cm Tube secured with: Tape Dental Injury: Teeth and Oropharynx as per pre-operative assessment

## 2015-03-20 DIAGNOSIS — R7309 Other abnormal glucose: Secondary | ICD-10-CM

## 2015-03-20 DIAGNOSIS — E860 Dehydration: Secondary | ICD-10-CM | POA: Diagnosis present

## 2015-03-20 DIAGNOSIS — R739 Hyperglycemia, unspecified: Secondary | ICD-10-CM | POA: Diagnosis present

## 2015-03-20 DIAGNOSIS — T8459XA Infection and inflammatory reaction due to other internal joint prosthesis, initial encounter: Secondary | ICD-10-CM

## 2015-03-20 DIAGNOSIS — Z8719 Personal history of other diseases of the digestive system: Secondary | ICD-10-CM

## 2015-03-20 DIAGNOSIS — R0989 Other specified symptoms and signs involving the circulatory and respiratory systems: Secondary | ICD-10-CM | POA: Diagnosis present

## 2015-03-20 DIAGNOSIS — M25551 Pain in right hip: Secondary | ICD-10-CM

## 2015-03-20 DIAGNOSIS — F419 Anxiety disorder, unspecified: Secondary | ICD-10-CM | POA: Diagnosis present

## 2015-03-20 DIAGNOSIS — Z96649 Presence of unspecified artificial hip joint: Secondary | ICD-10-CM

## 2015-03-20 LAB — CBC WITH DIFFERENTIAL/PLATELET
Basophils Absolute: 0 10*3/uL (ref 0.0–0.1)
Basophils Relative: 0 %
EOS PCT: 0 %
Eosinophils Absolute: 0 10*3/uL (ref 0.0–0.7)
HCT: 20.5 % — ABNORMAL LOW (ref 36.0–46.0)
Hemoglobin: 7 g/dL — ABNORMAL LOW (ref 12.0–15.0)
LYMPHS ABS: 0.7 10*3/uL (ref 0.7–4.0)
LYMPHS PCT: 6 %
MCH: 26.8 pg (ref 26.0–34.0)
MCHC: 34.1 g/dL (ref 30.0–36.0)
MCV: 78.5 fL (ref 78.0–100.0)
MONO ABS: 0.8 10*3/uL (ref 0.1–1.0)
Monocytes Relative: 8 %
Neutro Abs: 8.9 10*3/uL — ABNORMAL HIGH (ref 1.7–7.7)
Neutrophils Relative %: 86 %
PLATELETS: 170 10*3/uL (ref 150–400)
RBC: 2.61 MIL/uL — ABNORMAL LOW (ref 3.87–5.11)
RDW: 13.5 % (ref 11.5–15.5)
WBC: 10.3 10*3/uL (ref 4.0–10.5)

## 2015-03-20 LAB — POCT I-STAT EG7
Acid-base deficit: 1 mmol/L (ref 0.0–2.0)
Bicarbonate: 23.1 mEq/L (ref 20.0–24.0)
CALCIUM ION: 1.17 mmol/L (ref 1.12–1.23)
HEMATOCRIT: 24 % — AB (ref 36.0–46.0)
HEMOGLOBIN: 8.2 g/dL — AB (ref 12.0–15.0)
O2 Saturation: 96 %
POTASSIUM: 4.3 mmol/L (ref 3.5–5.1)
SODIUM: 124 mmol/L — AB (ref 135–145)
TCO2: 24 mmol/L (ref 0–100)
pCO2, Ven: 34.4 mmHg — ABNORMAL LOW (ref 45.0–50.0)
pH, Ven: 7.436 — ABNORMAL HIGH (ref 7.250–7.300)
pO2, Ven: 79 mmHg — ABNORMAL HIGH (ref 30.0–45.0)

## 2015-03-20 LAB — BASIC METABOLIC PANEL
Anion gap: 8 (ref 5–15)
BUN: 9 mg/dL (ref 6–20)
CHLORIDE: 98 mmol/L — AB (ref 101–111)
CO2: 24 mmol/L (ref 22–32)
CREATININE: 0.88 mg/dL (ref 0.44–1.00)
Calcium: 8.5 mg/dL — ABNORMAL LOW (ref 8.9–10.3)
GFR calc Af Amer: >60 mL/min (ref >60)
GFR calc non Af Amer: >60 mL/min (ref >60)
GLUCOSE: 175 mg/dL — AB (ref 65–99)
POTASSIUM: 4.7 mmol/L (ref 3.5–5.1)
Sodium: 130 mmol/L — ABNORMAL LOW (ref 135–145)

## 2015-03-20 LAB — POCT I-STAT 4, (NA,K, GLUC, HGB,HCT)
Glucose, Bld: 181 mg/dL — ABNORMAL HIGH (ref 65–99)
Glucose, Bld: 243 mg/dL — ABNORMAL HIGH (ref 65–99)
HCT: 25 % — ABNORMAL LOW (ref 36.0–46.0)
HEMATOCRIT: 27 % — AB (ref 36.0–46.0)
Hemoglobin: 9.2 g/dL — ABNORMAL LOW (ref 12.0–15.0)
Hemoglobin: 8.5 g/dL — ABNORMAL LOW (ref 12.0–15.0)
Potassium: 4.4 mmol/L (ref 3.5–5.1)
Potassium: 4.3 mmol/L (ref 3.5–5.1)
Sodium: 126 mmol/L — ABNORMAL LOW (ref 135–145)
Sodium: 125 mmol/L — ABNORMAL LOW (ref 135–145)

## 2015-03-20 LAB — OSMOLALITY: OSMOLALITY: 270 mosm/kg — AB (ref 275–295)

## 2015-03-20 LAB — PREPARE RBC (CROSSMATCH)

## 2015-03-20 LAB — TSH: TSH: 3.595 u[IU]/mL (ref 0.350–4.500)

## 2015-03-20 MED ORDER — ENSURE ENLIVE PO LIQD
237.0000 mL | Freq: Three times a day (TID) | ORAL | Status: DC
  Administered 2015-03-21 – 2015-03-23 (×5): 237 mL via ORAL

## 2015-03-20 MED ORDER — SODIUM CHLORIDE 0.9 % IV SOLN
Freq: Once | INTRAVENOUS | Status: AC
  Administered 2015-03-20: 17:00:00 via INTRAVENOUS

## 2015-03-20 MED ORDER — SODIUM CHLORIDE 0.9 % IV SOLN: INTRAVENOUS | Status: DC

## 2015-03-20 MED ORDER — BOOST / RESOURCE BREEZE PO LIQD: 1.0000 | Freq: Three times a day (TID) | ORAL | Status: DC

## 2015-03-20 NOTE — Evaluation (Signed)
Physical Therapy Evaluation Patient Details Name: Christina French MRN: [id number] DOB: 1955-12-09 Today's Date: 03/20/2015   History of Present Illness  59 y.o. 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 PMH: hypertension, anemia, anxiety, depression, seizures, vertigo.  Clinical Impression  Patient is s/p above surgery resulting in functional limitations due to the deficits listed below (see PT Problem List). Patient will benefit from skilled PT to increase their independence and safety with mobility to allow discharge to the venue listed below. Patient able to tolerate sitting edge of bed but unable to attempt standing at this time due to patient feeling weak and unsteady (awaiting blood). Will continue to follow and progress as tolerated. At this time, recommending D/C to SNF for further rehabilitation prior to returning home alone.        Follow Up Recommendations SNF;Supervision for mobility/OOB    Equipment Recommendations  None recommended by PT (patient reports having rw at home)    Recommendations for Other Services       Precautions / Restrictions Restrictions Weight Bearing Restrictions: Yes RLE Weight Bearing: Touchdown weight bearing Other Position/Activity Restrictions: for transfers only      Mobility  Bed Mobility Overal bed mobility: Needs Assistance Bed Mobility: Supine to Sit;Sit to Supine     Supine to sit: Mod assist;HOB elevated Sit to supine: Mod assist;HOB elevated   General bed mobility comments: Assist with Rt LE supine to sitting and both LEs with sitting to supine. Patient using bed rail to assist to sitting and HOB elevated.   Transfers                 General transfer comment: not attempted at this time. Patient reports feeling unsteady and weak with sitting edge of bed.   Ambulation/Gait                Stairs            Wheelchair Mobility    Modified Rankin (Stroke  Patients Only)       Balance Overall balance assessment: Needs assistance Sitting-balance support: Bilateral upper extremity supported Sitting balance-Leahy Scale: Poor                                       Pertinent Vitals/Pain Pain Assessment: 0-10 Pain Score: 8  Pain Location: Rt hip Pain Descriptors / Indicators: Sharp;Aching Pain Intervention(s): Limited activity within patient's tolerance;Monitored during session    Christina French expects to be discharged to:: Private residence Living Arrangements: Alone Available Help at Discharge: Other (Comment) (uncertain) Type of Home: Apartment Home Access: Ramped entrance     Home Layout: One level Home Equipment: Walker - 2 wheels;Kasandra Knudsen - single point Additional Comments: May be able to stay with daughter but would still be alone during the day. No assistance available at home. States that she did go to SNF following the initial surgery.    Prior Function Level of Independence: Independent with assistive device(s)         Comments: using cane prior to admint     Hand Dominance        Extremity/Trunk Assessment               Lower Extremity Assessment: RLE deficits/detail RLE Deficits / Details: assist needed for moving LE in bed.        Communication  Communication: No difficulties  Cognition Arousal/Alertness: Awake/alert Behavior During Therapy: WFL for tasks assessed/performed Overall Cognitive Status: Within Functional Limits for tasks assessed                      General Comments      Exercises        Assessment/Plan    PT Assessment Patient needs continued PT services  PT Diagnosis Difficulty walking;Generalized weakness;Acute pain   PT Problem List Decreased strength;Decreased range of motion;Decreased activity tolerance;Decreased balance;Decreased mobility;Pain  PT Treatment Interventions DME instruction;Gait training;Functional mobility  training;Therapeutic activities;Therapeutic exercise;Balance training;Patient/family education   PT Goals (Current goals can be found in the Care Plan section) Acute Rehab PT Goals Patient Stated Goal: eventually get back home PT Goal Formulation: With patient Time For Goal Achievement: 04/03/15 Potential to Achieve Goals: Good    Frequency Min 5X/week   Barriers to discharge Decreased caregiver support      Co-evaluation               End of Session Equipment Utilized During Treatment: Right knee immobilizer Activity Tolerance: Patient limited by fatigue;Patient limited by pain Patient left: in bed;with call bell/phone within reach;with SCD's reapplied Nurse Communication: Mobility status         Time: [id number] PT Time Calculation (min) (ACUTE ONLY): 27 min   Charges:   PT Evaluation $Initial PT Evaluation Tier I: 1 Procedure PT Treatments $Therapeutic Activity: 8-22 mins   PT G Codes:        Cassell Clement, PT, CSCS Pager [phone] Office [phone]  03/20/2015, 4:04 PM

## 2015-03-20 NOTE — Progress Notes (Signed)
Initial Nutrition Assessment  DOCUMENTATION CODES:   Obesity unspecified  INTERVENTION:  Discontinue Boost Breeze.  Provide Ensure Enlive po TID, each supplement provides 350 kcal and 20 grams of protein.  Encourage adequate PO intake.   NUTRITION DIAGNOSIS:   Inadequate oral intake related to poor appetite as evidenced by meal completion < 25%.  GOAL:   Patient will meet greater than or equal to 90% of their needs  MONITOR:   PO intake, Supplement acceptance, Weight trends, Labs, I & O's  REASON FOR ASSESSMENT:   Consult Assessment of nutrition requirement/status  ASSESSMENT:   59 year old female patient with past medical history of hypertension, chronic hyponatremia, seizure disorder, dyslipidemia. Patient was admitted to undergo removal of right hip prosthesis and placement of antibiotic spacer/antibiotic beads after failing nonsurgical conservative treatments for greater than 12 weeks.   PROCEDURE: (11/22): RIGHT TOTAL HIP ARTHROPLASTY removal and placement of abx spacer  Pt reports having a decreased appetite which has been ongoing since August of this year. Pt reports she did not eat her breakfast this AM. She reports PTA trying to consume at least 3 meals a day, however is experiencing financial problems which has been affecting her meal intake. Diet recall includes a carnation instant breakfast or eggs at breakfast, a [place] sandwich at lunch, and a peanut butter sandwich or another carnation instant breakfast at dinner time. Pt reports weight loss with usual body weight of 200 lbs. Pt with a 6.5% weight loss in 4 months (not found significant for time frame). Pt is agreeable to nutritional supplements (Ensure). RD to order. Pt was encouraged to eat her food at meals and to drink her supplements. Pt additionally encouraged adequate protein intake at home.   Pt with no observed significant fat or muscle mass loss.   Labs and medications reviewed.   Diet Order:  Diet  full liquid Room service appropriate?: Yes; Fluid consistency:: Thin  Skin:   (Incision on R hip)  Last BM:  11/22  Height:   Ht Readings from Last 1 Encounters:  03/19/15 5\' 1"  (1.549 m)    Weight:   Wt Readings from Last 1 Encounters:  03/19/15 187 lb 4.8 oz (84.959 kg)    Ideal Body Weight:  47 kg  BMI:  Body mass index is 35.41 kg/(m^2).  Estimated Nutritional Needs:   Kcal:  [id number]  Protein:  85-95 grams  Fluid:  1.8-2 L/day  EDUCATION NEEDS:   Education needs addressed  Corrin Parker, MS, RD, LDN Pager # [phone] After hours/ weekend pager # [phone]

## 2015-03-20 NOTE — Discharge Instructions (Signed)

## 2015-03-20 NOTE — Progress Notes (Signed)
PT Cancellation Note  Patient Details Name: Christina French MRN: [id number] DOB: [birth year]   Cancelled Treatment:    Reason Eval/Treat Not Completed: Patient at procedure or test/unavailable, will check back as time allows.    Cassell Clement, PT, CSCS Pager [phone] Office [phone]  03/20/2015, 1:16 PM

## 2015-03-20 NOTE — Progress Notes (Signed)
Subjective: Pt stable - in pain   Objective: Vital signs in last 24 hours: Temp:  [97 F (36.1 C)-98.5 F (36.9 C)] 97.9 F (36.6 C) (11/23 0430) Pulse Rate:  [59-89] 80 (11/23 0430) Resp:  [15-22] 17 (11/23 0430) BP: (89-116)/(41-80) 97/64 mmHg (11/23 0430) SpO2:  [97 %-100 %] 100 % (11/23 0430) Weight:  [82.419 kg (181 lb 11.2 oz)-84.959 kg (187 lb 4.8 oz)] 84.959 kg (187 lb 4.8 oz) (11/22 2140)  Intake/Output from previous day: 11/22 0701 - 11/23 0700 In: 3635 [I.V.:2800; Blood:335; IV Piggyback:500] Out: 2500 [Urine:1350; Blood:1150] Intake/Output this shift: Total I/O In: 550 [I.V.:300; IV Piggyback:250] Out: 550 [Urine:550]  Exam:  Intact pulses distally Dorsiflexion/Plantar flexion intact  Labs:  Recent Labs  03/19/15 1735 03/19/15 1744 03/20/15 0545  HGB 8.5* 8.2* 7.0*    Recent Labs  03/19/15 1744 03/20/15 0545  WBC  --  10.3  RBC  --  2.61*  HCT 24.0* 20.5*  PLT  --  170    Recent Labs  03/19/15 1240 03/19/15 1735 03/19/15 1744  NA 128* 125* 124*  K 4.2 4.3 4.3  CL 92*  --   --   CO2 28  --   --   BUN 11  --   --   CREATININE 0.83  --   --   GLUCOSE 131* 243*  --   CALCIUM 9.3  --   --    No results for input(s): LABPT, INR in the last 72 hours.  Assessment/Plan: Plan transfuse 2 u prbc and med consult for hyponatremia   Christina French 03/20/2015, 6:57 AM

## 2015-03-20 NOTE — Consult Note (Signed)
[facility] Consultation Note                                                                                    Christina French, is a 59 y.o. female  MRN: [id number]   DOB - 01-06-1956  Admit Date - 03/19/2015  Outpatient Primary MD for the patient is Humphrey Rolls, Timoteo Gaul, MD  Requesting MD: Marlou Sa / Orthopedics  Consulting MD: [facility] / Internal Medicine  Reason for Consult: Hyponatremia  With History of -  Past Medical History  Diagnosis Date  . Hypertension   . Anemia   . Anxiety   . Depression   . Seizures ([place])     rare - last one 07/30/14 - was very anemic  . GERD (gastroesophageal reflux disease)   . IBS (irritable bowel syndrome)   . Barrett esophagus   . Vertigo     thinks related to sinus issues  . Cancer ([place])   . Renal insufficiency     reports acute kidney injury  . History of pneumonia   . History of bronchitis   . Arthritis       Past Surgical History  Procedure Laterality Date  . Abdominal hysterectomy    . Hemorrhoid surgery    . Joint replacement Right 2007    hip  . Cyst excision  2011    from throat  . Cesarean section    . Colonoscopy N/A 08/31/2014    Procedure: COLONOSCOPY;  Surgeon: Lucilla Lame, MD;  Location: [place];  Service: Gastroenterology;  Laterality: N/A;  . Esophagogastroduodenoscopy N/A 08/31/2014    Procedure: ESOPHAGOGASTRODUODENOSCOPY (EGD);  Surgeon: Lucilla Lame, MD;  Location: [place];  Service: Gastroenterology;  Laterality: N/A;  . Incision and drainage hip Right 11/25/2014    Procedure: IRRIGATION  AND DRAINAGEOF RIGHT HIP WITH PLACEMENT OF ANTIBIOUTIC BEADS;  Surgeon: Mcarthur Rossetti, MD;  Location: [place];  Service: Orthopedics;  Laterality: Right;  . Incision and drainage hip Right 11/28/2014    Procedure: Repeat I&D Right Hip;  Surgeon: Mcarthur Rossetti, MD;  Location: [place];  Service: Orthopedics;  Laterality: Right;      HPI This is a 59 year old female patient with past medical  history of hypertension, chronic hyponatremia, seizure disorder, dyslipidemia. Patient was admitted to undergo removal of right hip prosthesis and placement of antibiotic spacer/antibiotic beads after failing nonsurgical conservative treatments for greater than 12 weeks. Patient previously evaluated by [facility] in August 2016 after an admission for sepsis related to infected right hip prosthesis and C. difficile colitis. During that hospitalization she was also treated for acute postoperative blood loss anemia and was determined to have chronic hyponatremia with baseline sodium between 128 and 130. On date of admission this hospitalization patient's sodium was 128 and dipped to a low of 124 and is subsequently increased to 130 noting patient currently on IV fluids. Consultation has been requested to manage hyponatremia.   Review of Systems   In addition to the HPI above,  No Fever-chills, myalgias or other constitutional symptoms No Headache, changes with Vision or hearing, new weakness, tingling, numbness in any extremity, No problems swallowing food or Liquids,  indigestion/reflux; does report significant issues with anorexia and poor intake of both solids and fluids No Chest pain, Cough or Shortness of Breath; she has been experiencing intermittent palpitations, no orthopnea or DOE No Abdominal pain, N/V; no melena or hematochezia, no dark tarry stools, Bowel movements are regular, No dysuria, hematuria or flank pain No new skin rashes, lesions, masses or bruises, No new joints pains-aches No recent weight gain or loss No polyuria, polydypsia or polyphagia,  *A full 10 point Review of Systems was done, except as stated above, all other Review of Systems were negative.  Social History Social History  Substance Use Topics  . Smoking status: Never Smoker   . Smokeless tobacco: Not on file  . Alcohol Use: No    Resides at: Private residence  Lives with: Alone  Ambulatory status:  Rolling walker   Family History Family History  Problem Relation Age of Onset  . Alcoholism Father   . Throat cancer Mother      Prior to Admission medications   Medication Sig Start Date End Date Taking? Authorizing Provider  ALPRAZolam Duanne Moron) 1 MG tablet Take 1 mg by mouth 3 (three) times daily as needed for anxiety.    Yes Historical Provider, MD  atenolol (TENORMIN) 50 MG tablet Take 50 mg by mouth 2 (two) times daily.   Yes Historical Provider, MD  carbamazepine (TEGRETOL) 200 MG tablet Take 300 mg by mouth 2 (two) times daily.    Yes Historical Provider, MD  dexlansoprazole (DEXILANT) 60 MG capsule Take 60 mg by mouth daily.    Yes Historical Provider, MD  escitalopram (LEXAPRO) 20 MG tablet Take 20 mg by mouth daily.    Yes Historical Provider, MD  hyoscyamine (LEVSIN, ANASPAZ) 0.125 MG tablet Take 1 tablet (0.125 mg total) by mouth 4 (four) times daily as needed for cramping. 02/06/15  Yes Lucilla Lame, MD  ibuprofen (ADVIL,MOTRIN) 200 MG tablet Take 200 mg by mouth every 4 (four) hours as needed for headache or mild pain.    Yes Historical Provider, MD  mirtazapine (REMERON) 15 MG tablet Take 30 mg by mouth at bedtime.    Yes Historical Provider, MD  ondansetron (ZOFRAN) 8 MG tablet Take 8 mg by mouth 2 (two) times daily as needed for nausea or vomiting.    Yes Historical Provider, MD  traZODone (DESYREL) 50 MG tablet Take 50 mg by mouth at bedtime.   Yes Historical Provider, MD    No Known Allergies  Physical Exam  Vitals  Blood pressure 104/69, pulse 76, temperature 97.9 F (36.6 C), temperature source Oral, resp. rate 17, height 5\' 1"  (1.549 m), weight 187 lb 4.8 oz (84.959 kg), SpO2 100 %.   General:  In mild acute distress as evidenced by significant anxiety, appears healthy and well nourished; reporting difficulty in managing postoperative pain  Psych:  Anxious affect,  Awake Alert, Oriented X 3. Speech and thought patterns are clear and appropriate, no apparent  short term memory deficits  Neuro:   No focal neurological deficits, CN II through XII intact, Strength 5/5 all 4 extremities but limited in right lower extremity secondary to postoperative pain and immobilizer in place, Sensation intact all 4 extremities.  ENT:  Ears and Eyes appear Normal, Conjunctivae clear, PER. Moist oral mucosa without erythema or exudates.  Neck:  Supple, No lymphadenopathy appreciated  Respiratory:  Symmetrical chest wall movement, Good air movement bilaterally, CTAB. Room Air  Cardiac: Irregularly irregular, subtle grade 1/6 systolic murmur, no LE edema noted,  no JVD, No carotid bruits, peripheral pulses palpable at 2+  Abdomen:  Positive bowel sounds, Soft, Non tender, Non distended,  No masses appreciated, no obvious hepatosplenomegaly  Skin:  No Cyanosis, Normal Skin Turgor, No Skin Rash or Bruise.  Extremities: Symmetrical without obvious trauma or injury,  no effusions.  Data Review  CBC  Recent Labs Lab 03/19/15 1735 03/19/15 1744 03/20/15 0545  WBC  --   --  10.3  HGB 8.5* 8.2* 7.0*  HCT 25.0* 24.0* 20.5*  PLT  --   --  170  MCV  --   --  78.5  MCH  --   --  26.8  MCHC  --   --  34.1  RDW  --   --  13.5  LYMPHSABS  --   --  0.7  MONOABS  --   --  0.8  EOSABS  --   --  0.0  BASOSABS  --   --  0.0    Chemistries   Recent Labs Lab 03/19/15 1240 03/19/15 1735 03/19/15 1744 03/20/15 0545  NA 128* 125* 124* 130*  K 4.2 4.3 4.3 4.7  CL 92*  --   --  98*  CO2 28  --   --  24  GLUCOSE 131* 243*  --  175*  BUN 11  --   --  9  CREATININE 0.83  --   --  0.88  CALCIUM 9.3  --   --  8.5*    estimated creatinine clearance is 69.8 mL/min (by C-G formula based on Cr of 0.88).  No results for input(s): TSH, T4TOTAL, T3FREE, THYROIDAB in the last 72 hours.  Invalid input(s): FREET3  Coagulation profile No results for input(s): INR, PROTIME in the last 168 hours.  No results for input(s): DDIMER in the last 72 hours.  Cardiac  Enzymes No results for input(s): CKMB, TROPONINI, MYOGLOBIN in the last 168 hours.  Invalid input(s): CK  Invalid input(s): POCBNP  Urinalysis    Component Value Date/Time   COLORURINE YELLOW 03/08/2015 1349   COLORURINE Yellow 09/25/2011 1003   APPEARANCEUR CLEAR 03/08/2015 1349   APPEARANCEUR Clear 09/25/2011 1003   LABSPEC 1.022 03/08/2015 1349   LABSPEC 1.009 09/25/2011 1003   PHURINE 5.5 03/08/2015 1349   PHURINE 7.0 09/25/2011 1003   GLUCOSEU NEGATIVE 03/08/2015 1349   GLUCOSEU Negative 09/25/2011 1003   HGBUR TRACE* 03/08/2015 1349   HGBUR Negative 09/25/2011 1003   BILIRUBINUR NEGATIVE 03/08/2015 1349   BILIRUBINUR Negative 09/25/2011 1003   KETONESUR NEGATIVE 03/08/2015 1349   KETONESUR Negative 09/25/2011 1003   PROTEINUR NEGATIVE 03/08/2015 1349   PROTEINUR Negative 09/25/2011 1003   UROBILINOGEN 0.2 03/08/2015 1349   NITRITE NEGATIVE 03/08/2015 1349   NITRITE Negative 09/25/2011 1003   LEUKOCYTESUR SMALL* 03/08/2015 1349   LEUKOCYTESUR Negative 09/25/2011 1003    Imaging results:   Dg Pelvis Portable  03/19/2015  CLINICAL DATA:  Prosthetic hip infection, postoperative revision. EXAM: PORTABLE PELVIS 1-2 VIEWS COMPARISON:  11/25/2014 and fluoroscopic spot images from 11/20 2/16 FINDINGS: A new right hip prosthesis is in place with some cerclage wires holding the proximal femoral fragment in place, and with the stem extending into the distal femoral shaft fragment. Scattered nodular densities compatible with antibiotic beads in the region of the operative bed and implant. Along the acetabulum there is a bony density shell spacer in place aligned with the axis of the prosthetic femoral head. Rectal staple line noted. Mild degenerative arthropathy of the left hip. IMPRESSION:  1. New right hip implant in place, suspected separate greater trochanteric fragment and shaft fragment, with cerclage wires noted in the vicinity of the trochanteric fragment. Antibiotic beads in  place. Bone density shell spacer along the acetabulum. No significant malalignment. Electronically Signed   By: Van Clines M.D.   On: 03/19/2015 21:20   Dg Hip Operative Unilat With Pelvis Right  03/19/2015  CLINICAL DATA:  Right total hip arthroplasty revision EXAM: OPERATIVE RIGHT HIP (WITH PELVIS IF PERFORMED) 1 VIEWS TECHNIQUE: Fluoroscopic spot image(s) were submitted for interpretation post-operatively. COMPARISON:  11/23/2014 FINDINGS: Limited spot fluoroscopic intraoperative view during the total hip revision. Hardware has been revised. Cerclage wire about the intertrochanteric region. Alignment with the acetabulum is difficult to assess because of the projection and only one view. IMPRESSION: Limited operative imaging during right hip arthroplasty revision. Electronically Signed   By: Jerilynn Mages.  Shick M.D.   On: 03/19/2015 19:04     EKG: (Independently reviewed) ordered and is pending   Assessment & Plan  Principal Problem:   Chronic hyponatremia/Dehydration -Baseline sodium reported to be between 128 and 130 which is consistent with pre-operative sodium -Suspect a degree of mild dehydration before admission also contributing -Suspect chronicity related to needed psychiatric medications primarily her SSRI which cannot be discontinued -Agree with continuing IV fluids but with potassium trending upward will change to normal saline at 100 mL per hour -Patient did have hypotension overnight with systolic blood pressure dipping to between 80 and 89 and was given 1 bottle 5% albumin with improvement in blood pressure -Check urine sodium, urine osmolality and serum osmolality -Patient endorses poor oral intake prior to admission so we'll obtain nutrition consultation  Active Problems:   Acute hyperglycemia -Since admission has had glucose ranging from 132 a peak of 243 with lowest sodium correlating with peak and glucose -No known prior history personal diabetes or in family -Check  hemoglobin A1c    Pulse irregularity -Check EKG -Preadmission beta blocker on hold secondary to suboptimal blood pressure so monitor for rebound tachycardia -Check TSH    Seizure disorder (HCC) -Continue preadmission Tegretol    HTN (hypertension) -Blood pressure soft and suboptimal -Hold Tenormin    Acute blood loss anemia related to surgical procedure -History of similar type anemia with prior orthopedic surgery -Agree with transfusion packed red blood cells as ordered by orthopedic service    Anxiety disorder -Patient became quite tearful when discussing her current illness and plan of care; very anxious regarding need for repeat surgery in the next few weeks-she is also uncertain if she can go home or will need to go to rehabilitation facility after discharge this admission -Continue preadmission Xanax, Lexapro, Remeron and Desyrel    HLD (hyperlipidemia) -Not on medications prior to admission    Prosthetic hip infection ([place]) -Per orthopedic team -On Xarelto for DVT prophylaxis; will ask case manager to clarify co-pay    DVT Prophylaxis: Xarelto  Family Communication:   No family at bedside  Code Status:  Full code  Condition: Stable   Discharge disposition: At discretion of primary team  Time spent in minutes : 60      Rebeccah Ivins L. ANP on 03/20/2015 at 9:48 AM  Between 7am to 7pm - Pager - [phone]  After 7pm go to [email or web address] - password [email or web address]  And look for the night coverage person covering me after hours  [facility]

## 2015-03-20 NOTE — Progress Notes (Signed)
[place] for Infectious Disease  Total days of antibiotics 2        Day 2 cefazolin               Reason for Consult: prosthetic hip infection s/p abtx spacer    Referring Physician: dean  Principal Problem:   Chronic hyponatremia Active Problems:   Seizure disorder ([place])   HTN (hypertension)   HLD (hyperlipidemia)   Prosthetic hip infection ([place])   Anxiety disorder   Acute hyperglycemia   Pulse irregularity   Dehydration    HPI: Christina French is a 59 y.o. female with history of R total hip arthroplasty in 2007. In Summer of 2016, she had excision of melanoma from R thigh,  Complicated by wound healing. She was admitted in July 2016 for right hip pain, found to have fever, luekocytosis and Ri hip aspirate cx growing MSSA. She underwent I x D with abtx spacer on 8/3 and placed on 8 wks of cefazolin-rifampin.this course complicated by C.difficile. She was followed in the ID clinic with Dr.Hatcher who transitioned her to cephalexin and rifampin til her surgery on 11/22 for new THR. Dr. Marybelle Killings took her to the [facility] on 11/22. Aspirate of joint showed many WBC and noticed tissue still had evidence of inflammation and concerned with ongoing infection. He removed the prosthesis, did extensive Irrigation and debridement and placed antibiotic spacer, for a 2 staged revision. Maintained on cefazolin. OR cultures pending. Patient remains afebrile. No leukocytosis. The aspirate fluid cx from 11/22 showing a few staph aureus   Past Medical History  Diagnosis Date  . Hypertension   . Anemia   . Anxiety   . Depression   . Seizures ([place])     rare - last one 07/30/14 - was very anemic  . GERD (gastroesophageal reflux disease)   . IBS (irritable bowel syndrome)   . Barrett esophagus   . Vertigo     thinks related to sinus issues  . Cancer ([place])   . Renal insufficiency     reports acute kidney injury  . History of pneumonia   . History of bronchitis   . Arthritis     Allergies: No Known  Allergies  MEDICATIONS: . sodium chloride   Intravenous Once  . atenolol  50 mg Oral BID  . carbamazepine  300 mg Oral BID  .  ceFAZolin (ANCEF) IV  2 g Intravenous Q6H  . escitalopram  20 mg Oral Daily  . feeding supplement  1 Container Oral TID BM  . mirtazapine  30 mg Oral QHS  . pantoprazole  40 mg Oral Daily  . rivaroxaban  10 mg Oral Daily  . traZODone  50 mg Oral QHS    Social History  Substance Use Topics  . Smoking status: Never Smoker   . Smokeless tobacco: None  . Alcohol Use: No    Family History  Problem Relation Age of Onset  . Alcoholism Father   . Throat cancer Mother      Review of Systems  Constitutional: Negative for fever, chills, diaphoresis, activity change, appetite change, fatigue and unexpected weight change.  HENT: Negative for congestion, sore throat, rhinorrhea, sneezing, trouble swallowing and sinus pressure.  Eyes: Negative for photophobia and visual disturbance.  Respiratory: Negative for cough, chest tightness, shortness of breath, wheezing and stridor.  Cardiovascular: Negative for chest pain, palpitations and leg swelling.  Gastrointestinal: Negative for nausea, vomiting, abdominal pain, diarrhea, constipation, blood in stool, abdominal distention and anal  bleeding.  Genitourinary: Negative for dysuria, hematuria, flank pain and difficulty urinating.  Musculoskeletal: Negative for myalgias, back pain, joint swelling, arthralgias and gait problem.  Skin: Negative for color change, pallor, rash and wound.  Neurological: right leg pain Hematological: Negative for adenopathy. Does not bruise/bleed easily.  Psychiatric/Behavioral: Negative for behavioral problems, confusion, sleep disturbance, dysphoric mood, decreased concentration and agitation.     OBJECTIVE: Temp:  [97 F (36.1 C)-98.5 F (36.9 C)] 97.9 F (36.6 C) (11/23 0430) Pulse Rate:  [59-89] 76 (11/23 0843) Resp:  [15-22] 17 (11/23 0430) BP: (89-116)/(41-80) 104/69 mmHg  (11/23 0843) SpO2:  [97 %-100 %] 100 % (11/23 0430) Weight:  [181 lb 11.2 oz (82.419 kg)-187 lb 4.8 oz (84.959 kg)] 187 lb 4.8 oz (84.959 kg) (11/22 2140) Physical Exam  Constitutional:  oriented to person, place, and time. appears well-developed and well-nourished. No distress.  HENT: [place]/AT, PERRLA, no scleral icterus Mouth/Throat: Oropharynx is clear and moist. No oropharyngeal exudate.  Cardiovascular: Normal rate, regular rhythm and normal heart sounds. Exam reveals no gallop and no friction rub.  No murmur heard.  Pulmonary/Chest: Effort normal and breath sounds normal. No respiratory distress.  has no wheezes.  Neck = supple, no nuchal rigidity Abdominal: Soft. Bowel sounds are normal.  exhibits no distension. There is no tenderness.  Lymphadenopathy: no cervical adenopathy. No axillary adenopathy Neurological: alert and oriented to person, place, and time.  Skin: right hip small bandage at incision site no erythema or drainage Ext = right leg in soft brace  Psychiatric: a normal mood and affect.  behavior is normal.      LABS: Results for orders placed or performed during the hospital encounter of 03/19/15 (from the past 48 hour(s))  Basic metabolic panel     Status: Abnormal   Collection Time: 03/19/15 12:40 PM  Result Value Ref Range   Sodium 128 (L) 135 - 145 mmol/L   Potassium 4.2 3.5 - 5.1 mmol/L   Chloride 92 (L) 101 - 111 mmol/L   CO2 28 22 - 32 mmol/L   Glucose, Bld 131 (H) 65 - 99 mg/dL   BUN 11 6 - 20 mg/dL   Creatinine, Ser 0.83 0.44 - 1.00 mg/dL   Calcium 9.3 8.9 - 10.3 mg/dL   GFR calc non Af Amer >60 >60 mL/min   GFR calc Af Amer >60 >60 mL/min    Comment: (NOTE) The eGFR has been calculated using the CKD EPI equation. This calculation has not been validated in all clinical situations. eGFR's persistently <60 mL/min signify possible Chronic Kidney Disease.    Anion gap 8 5 - 15  Type and screen [place]     Status: None (Preliminary  result)   Collection Time: 03/19/15  1:55 PM  Result Value Ref Range   ABO/RH(D) O NEG    Antibody Screen NEG    Sample Expiration 03/22/2015    Unit Number [id number]    Blood Component Type RBC LR PHER2    Unit division 00    Status of Unit ALLOCATED    Donor AG Type NEGATIVE FOR C ANTIGEN NEGATIVE FOR KELL ANTIGEN    Transfusion Status OK TO TRANSFUSE    Crossmatch Result COMPATIBLE    Unit Number [id number]    Blood Component Type RED CELLS,LR    Unit division 00    Status of Unit ALLOCATED    Donor AG Type NEGATIVE FOR C ANTIGEN NEGATIVE FOR KELL ANTIGEN    Transfusion Status OK TO TRANSFUSE  Crossmatch Result COMPATIBLE   Gram stain     Status: None   Collection Time: 03/19/15  2:02 PM  Result Value Ref Range   Specimen Description FLUID SYNOVIAL RIGHT HIP    Special Requests FLUID ON SWAB    Gram Stain      ABUNDANT WBC PRESENT,BOTH PMN AND MONONUCLEAR NO ORGANISMS SEEN    Report Status 03/19/2015 FINAL   Tissue culture     Status: None (Preliminary result)   Collection Time: 03/19/15  3:51 PM  Result Value Ref Range   Specimen Description TISSUE RIGHT HIP    Special Requests NONE    Gram Stain      NO WBC SEEN NO ORGANISMS SEEN Performed at [facility]    Culture NO GROWTH Performed at [facility]     Report Status PENDING   I-STAT 4, (NA,K, GLUC, HGB,HCT)     Status: Abnormal   Collection Time: 03/19/15  5:35 PM  Result Value Ref Range   Sodium 125 (L) 135 - 145 mmol/L   Potassium 4.3 3.5 - 5.1 mmol/L   Glucose, Bld 243 (H) 65 - 99 mg/dL   HCT 25.0 (L) 36.0 - 46.0 %   Hemoglobin 8.5 (L) 12.0 - 15.0 g/dL  Prepare RBC     Status: None   Collection Time: 03/19/15  5:41 PM  Result Value Ref Range   Order Confirmation ORDER PROCESSED BY BLOOD BANK   POCT I-Stat EG7     Status: Abnormal   Collection Time: 03/19/15  5:44 PM  Result Value Ref Range   pH, Ven 7.436 (H) 7.250 - 7.300   pCO2, Ven 34.4 (L) 45.0 - 50.0 mmHg   pO2, Ven  79.0 (H) 30.0 - 45.0 mmHg   Bicarbonate 23.1 20.0 - 24.0 mEq/L   TCO2 24 0 - 100 mmol/L   O2 Saturation 96.0 %   Acid-base deficit 1.0 0.0 - 2.0 mmol/L   Sodium 124 (L) 135 - 145 mmol/L   Potassium 4.3 3.5 - 5.1 mmol/L   Calcium, Ion 1.17 1.12 - 1.23 mmol/L   HCT 24.0 (L) 36.0 - 46.0 %   Hemoglobin 8.2 (L) 12.0 - 15.0 g/dL   Patient temperature 37.3 C    Sample type VENOUS   Basic metabolic panel     Status: Abnormal   Collection Time: 03/20/15  5:45 AM  Result Value Ref Range   Sodium 130 (L) 135 - 145 mmol/L   Potassium 4.7 3.5 - 5.1 mmol/L   Chloride 98 (L) 101 - 111 mmol/L   CO2 24 22 - 32 mmol/L   Glucose, Bld 175 (H) 65 - 99 mg/dL   BUN 9 6 - 20 mg/dL   Creatinine, Ser 0.88 0.44 - 1.00 mg/dL   Calcium 8.5 (L) 8.9 - 10.3 mg/dL   GFR calc non Af Amer >60 >60 mL/min   GFR calc Af Amer >60 >60 mL/min    Comment: (NOTE) The eGFR has been calculated using the CKD EPI equation. This calculation has not been validated in all clinical situations. eGFR's persistently <60 mL/min signify possible Chronic Kidney Disease.    Anion gap 8 5 - 15  CBC with Differential/Platelet     Status: Abnormal   Collection Time: 03/20/15  5:45 AM  Result Value Ref Range   WBC 10.3 4.0 - 10.5 K/uL   RBC 2.61 (L) 3.87 - 5.11 MIL/uL   Hemoglobin 7.0 (L) 12.0 - 15.0 g/dL   HCT 20.5 (L) 36.0 -  46.0 %   MCV 78.5 78.0 - 100.0 fL   MCH 26.8 26.0 - 34.0 pg   MCHC 34.1 30.0 - 36.0 g/dL   RDW 13.5 11.5 - 15.5 %   Platelets 170 150 - 400 K/uL   Neutrophils Relative % 86 %   Neutro Abs 8.9 (H) 1.7 - 7.7 K/uL   Lymphocytes Relative 6 %   Lymphs Abs 0.7 0.7 - 4.0 K/uL   Monocytes Relative 8 %   Monocytes Absolute 0.8 0.1 - 1.0 K/uL   Eosinophils Relative 0 %   Eosinophils Absolute 0.0 0.0 - 0.7 K/uL   Basophils Relative 0 %   Basophils Absolute 0.0 0.0 - 0.1 K/uL  Prepare RBC     Status: None   Collection Time: 03/20/15  7:04 AM  Result Value Ref Range   Order Confirmation      ORDER PROCESSED BY  BLOOD BANK BB SAMPLE OR UNITS ALREADY AVAILABLE    MICRO: 11/22 or cx pending 11/22 synovial fluid cx - few staph aureus IMAGING: Dg Pelvis Portable  03/19/2015  CLINICAL DATA:  Prosthetic hip infection, postoperative revision. EXAM: PORTABLE PELVIS 1-2 VIEWS COMPARISON:  11/25/2014 and fluoroscopic spot images from 11/20 2/16 FINDINGS: A new right hip prosthesis is in place with some cerclage wires holding the proximal femoral fragment in place, and with the stem extending into the distal femoral shaft fragment. Scattered nodular densities compatible with antibiotic beads in the region of the operative bed and implant. Along the acetabulum there is a bony density shell spacer in place aligned with the axis of the prosthetic femoral head. Rectal staple line noted. Mild degenerative arthropathy of the left hip. IMPRESSION: 1. New right hip implant in place, suspected separate greater trochanteric fragment and shaft fragment, with cerclage wires noted in the vicinity of the trochanteric fragment. Antibiotic beads in place. Bone density shell spacer along the acetabulum. No significant malalignment. Electronically Signed   By: Van Clines M.D.   On: 03/19/2015 21:20   Dg Hip Operative Unilat With Pelvis Right  03/19/2015  CLINICAL DATA:  Right total hip arthroplasty revision EXAM: OPERATIVE RIGHT HIP (WITH PELVIS IF PERFORMED) 1 VIEWS TECHNIQUE: Fluoroscopic spot image(s) were submitted for interpretation post-operatively. COMPARISON:  11/23/2014 FINDINGS: Limited spot fluoroscopic intraoperative view during the total hip revision. Hardware has been revised. Cerclage wire about the intertrochanteric region. Alignment with the acetabulum is difficult to assess because of the projection and only one view. IMPRESSION: Limited operative imaging during right hip arthroplasty revision. Electronically Signed   By: Jerilynn Mages.  Shick M.D.   On: 03/19/2015 19:04      Assessment/Plan:  59yo F with prolonged MSSA  prosthetic hip infection, initial attempt to treat with retained prosthesis plus 8 wk of IV therapy, however on 11/22 still found evidence of ongoing infection. She is POD#1 from prosthesis removal, I  XD, and abtx spacer for anticipated 2 staged revision. Now on cefazolin. cx pending  - recommend to continue with cefazolin 2gm iv Q 8hr. Will follow culture results to ensure same pathogen. No need for rifampin since original prosthesis is removed  Pain management = defer to primary team.  Hx of c.diffi = If she has 3 + watery stools/diarrhea in 24hr,  would have low threshold to test for cdifficile  Christina French B. [facility] for Infectious Diseases [phone]

## 2015-03-21 DIAGNOSIS — T8459XS Infection and inflammatory reaction due to other internal joint prosthesis, sequela: Secondary | ICD-10-CM

## 2015-03-21 DIAGNOSIS — I10 Essential (primary) hypertension: Secondary | ICD-10-CM

## 2015-03-21 DIAGNOSIS — E871 Hypo-osmolality and hyponatremia: Secondary | ICD-10-CM

## 2015-03-21 DIAGNOSIS — G40909 Epilepsy, unspecified, not intractable, without status epilepticus: Secondary | ICD-10-CM

## 2015-03-21 DIAGNOSIS — A4901 Methicillin susceptible Staphylococcus aureus infection, unspecified site: Secondary | ICD-10-CM

## 2015-03-21 LAB — BASIC METABOLIC PANEL
ANION GAP: 5 (ref 5–15)
BUN: 6 mg/dL (ref 6–20)
CHLORIDE: 99 mmol/L — AB (ref 101–111)
CO2: 28 mmol/L (ref 22–32)
Calcium: 9.2 mg/dL (ref 8.9–10.3)
Creatinine, Ser: 0.68 mg/dL (ref 0.44–1.00)
GFR calc Af Amer: >60 mL/min (ref >60)
Glucose, Bld: 123 mg/dL — ABNORMAL HIGH (ref 65–99)
POTASSIUM: 3.8 mmol/L (ref 3.5–5.1)
SODIUM: 132 mmol/L — AB (ref 135–145)

## 2015-03-21 LAB — TYPE AND SCREEN
ABO/RH(D): O NEG
Antibody Screen: POSITIVE
DAT, IgG: NEGATIVE
DONOR AG TYPE: NEGATIVE
Donor AG Type: NEGATIVE
Unit division: 0
Unit division: 0

## 2015-03-21 LAB — BODY FLUID CULTURE

## 2015-03-21 LAB — HEMOGLOBIN A1C
Hgb A1c MFr Bld: 6.1 % — ABNORMAL HIGH (ref 4.8–5.6)
Mean Plasma Glucose: 128 mg/dL

## 2015-03-21 LAB — OSMOLALITY, URINE: Osmolality, Ur: 538 mOsm/kg (ref 300–900)

## 2015-03-21 LAB — HEMOGLOBIN AND HEMATOCRIT, BLOOD
HEMATOCRIT: 26 % — AB (ref 36.0–46.0)
HEMOGLOBIN: 8.8 g/dL — AB (ref 12.0–15.0)

## 2015-03-21 LAB — SODIUM, URINE, RANDOM: Sodium, Ur: 130 mmol/L

## 2015-03-21 MED ORDER — CEFAZOLIN SODIUM-DEXTROSE 2-3 GM-% IV SOLR
2.0000 g | Freq: Three times a day (TID) | INTRAVENOUS | Status: DC
  Administered 2015-03-21 – 2015-03-23 (×7): 2 g via INTRAVENOUS
  Filled 2015-03-21 (×10): qty 50

## 2015-03-21 MED ORDER — DIPHENHYDRAMINE HCL 25 MG PO CAPS
25.0000 mg | ORAL_CAPSULE | Freq: Three times a day (TID) | ORAL | Status: DC | PRN
  Administered 2015-03-21 – 2015-03-22 (×4): 25 mg via ORAL
  Filled 2015-03-21 (×4): qty 1

## 2015-03-21 MED ORDER — CEFAZOLIN SODIUM-DEXTROSE 2-3 GM-% IV SOLR: 2.0000 g | Freq: Three times a day (TID) | INTRAVENOUS | Status: DC

## 2015-03-21 MED ORDER — OXYCODONE HCL 5 MG PO TABS: 5.0000 mg | ORAL_TABLET | ORAL | Status: DC | PRN

## 2015-03-21 MED ORDER — RIVAROXABAN 10 MG PO TABS: 10.0000 mg | ORAL_TABLET | Freq: Every day | ORAL | Status: DC

## 2015-03-21 NOTE — Progress Notes (Signed)
Subjective: Patient feeling better today pain is better controlled   Objective: Vital signs in last 24 hours: Temp:  [98.1 F (36.7 C)-99.4 F (37.4 C)] 98.4 F (36.9 C) (11/24 0445) Pulse Rate:  [68-98] 72 (11/24 0445) Resp:  [16-18] 18 (11/24 0445) BP: (102-136)/(46-72) 118/64 mmHg (11/24 0445) SpO2:  [98 %-100 %] 98 % (11/24 0445)  Intake/Output from previous day: 11/23 0701 - 11/24 0700 In: 1581.7 [P.O.:530; Blood:1051.7] Out: 150 [Urine:150] Intake/Output this shift:    Exam:  Intact pulses distally Dorsiflexion/Plantar flexion intact Leg lengths approximately equal  Labs:  Recent Labs  03/19/15 1641 03/19/15 1735 03/19/15 1744 03/20/15 0545 03/21/15 0346  HGB 9.2* 8.5* 8.2* 7.0* 8.8*    Recent Labs  03/20/15 0545 03/21/15 0346  WBC 10.3  --   RBC 2.61*  --   HCT 20.5* 26.0*  PLT 170  --     Recent Labs  03/20/15 0545 03/21/15 0327  NA 130* 132*  K 4.7 3.8  CL 98* 99*  CO2 24 28  BUN 9 6  CREATININE 0.88 0.68  GLUCOSE 175* 123*  CALCIUM 8.5* 9.2   No results for input(s): LABPT, INR in the last 72 hours.  Assessment/Plan: Plan at this time is for skilled nursing placement. Hematocrit has increased to 26. We'll work with physical therapy today for mobilization touchdown weightbearing only right lower extremity for transfers -  intraoperative cultures positive for staph  appreciate infectious disease recommendations for post operative IV antibiotics - sodium level has increased   Christina French 03/21/2015, 10:16 AM

## 2015-03-21 NOTE — Progress Notes (Signed)
Medical consultation follow-up progress note       PATIENT DETAILS Name: Christina French Age: 59 y.o. 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 Euvolemic on exam-stop IV fluids-place on fluid restriction, resume Lasix when diet further advanced. Monitor sodium.  Active Problems: Seizure disorder: Cautiously continue with Tegretol-if sodium worsens any further-may need to change to an alternate agent.  Hypertension: BP currently stable without the use of any antihypertensives-resume atenolol when able  Acute blood loss anemia related to surgical procedure: Hemoglobin stable-status post PRBC transfusion. Follow  Rest of the issues per primary service and ID  Disposition: Per primary service  Antimicrobial agents  See below  Anti-infectives    Start     Dose/Rate Route Frequency Ordered Stop   03/20/15 0030  ceFAZolin (ANCEF) IVPB 2 g/50 mL premix     2 g 100 mL/hr over 30 Minutes Intravenous Every 6 hours 03/19/15 2138 03/20/15 1546   03/19/15 1842  vancomycin (VANCOCIN) powder  Status:  Discontinued       As needed 03/19/15 1842 03/19/15 1944   03/19/15 1842  gentamicin (GARAMYCIN) injection  Status:  Discontinued       As needed 03/19/15 1843 03/19/15 1944   03/19/15 1730  tobramycin (NEBCIN) powder  Status:  Discontinued       As needed 03/19/15 1755 03/19/15 1944   03/19/15 1730  vancomycin (VANCOCIN) powder  Status:  Discontinued       As needed 03/19/15 1758 03/19/15 1944   03/19/15 1545  tobramycin (NEBCIN) powder 2.4 g  Status:  Discontinued     2.4 g Topical To Surgery 03/19/15 1537 03/19/15 2138   03/19/15 1315  ceFAZolin (ANCEF) IVPB 2 g/50 mL premix     2 g 100 mL/hr over 30 Minutes Intravenous To [facility] 03/18/15 1356  03/19/15 1824      Time spent 20 minutes-Greater than 50% of this time was spent in counseling, explanation of diagnosis, planning of further management, and coordination of care.  MEDICATIONS: Scheduled Meds: . carbamazepine  300 mg Oral BID  . escitalopram  20 mg Oral Daily  . feeding supplement (ENSURE ENLIVE)  237 mL Oral TID BM  . mirtazapine  30 mg Oral QHS  . pantoprazole  40 mg Oral Daily  . rivaroxaban  10 mg Oral Daily  . traZODone  50 mg Oral QHS   Continuous Infusions: . sodium chloride     PRN Meds:.acetaminophen **OR** acetaminophen, ALPRAZolam, diphenhydrAMINE, methocarbamol **OR** methocarbamol (ROBAXIN)  IV, metoCLOPramide **OR** metoCLOPramide (REGLAN) injection, morphine injection, ondansetron **OR** ondansetron (ZOFRAN) IV, oxyCODONE    PHYSICAL EXAM: Vital signs in last 24 hours: Filed Vitals:   03/20/15 2122 03/20/15 2137 03/21/15 0031 03/21/15 0445  BP: 133/70 115/63 110/46 118/64  Pulse: 90 92 68 72  Temp: 98.9 F (37.2 C) 98.6 F (37 C) 98.7 F (37.1 C) 98.4 F (36.9 C)  TempSrc: Oral Oral Oral Oral  Resp: 16 18 18 18   Height:      Weight:      SpO2: 100% 100% 99% 98%    Weight change:  Filed Weights   03/19/15 1201 03/19/15 2140  Weight: 82.419 kg (181 lb 11.2 oz) 84.959 kg (187 lb 4.8 oz)   Body mass index is  35.41 kg/(m^2).   Gen Exam: Awake and alert with clear speech.   Neck: Supple, No JVD.   Chest: B/L Clear.   CVS: S1 S2 Regular, no murmurs.  Abdomen: soft, BS +, non tender, non distended.  Extremities: no edema, lower extremities warm to touch. Neurologic: Non Focal.   Skin: No Rash.   Wounds: N/A.   Intake/Output from previous day:  Intake/Output Summary (Last 24 hours) at 03/21/15 1212 Last data filed at 03/21/15 1017  Gross per 24 hour  Intake 1611.67 ml  Output    350 ml  Net 1261.67 ml     LAB RESULTS: CBC  Recent Labs Lab 03/19/15 1641 03/19/15 1735 03/19/15 1744 03/20/15 0545 03/21/15 0346  WBC   --   --   --  10.3  --   HGB 9.2* 8.5* 8.2* 7.0* 8.8*  HCT 27.0* 25.0* 24.0* 20.5* 26.0*  PLT  --   --   --  170  --   MCV  --   --   --  78.5  --   MCH  --   --   --  26.8  --   MCHC  --   --   --  34.1  --   RDW  --   --   --  13.5  --   LYMPHSABS  --   --   --  0.7  --   MONOABS  --   --   --  0.8  --   EOSABS  --   --   --  0.0  --   BASOSABS  --   --   --  0.0  --     Chemistries   Recent Labs Lab 03/19/15 1240 03/19/15 1641 03/19/15 1735 03/19/15 1744 03/20/15 0545 03/21/15 0327  NA 128* 126* 125* 124* 130* 132*  K 4.2 4.4 4.3 4.3 4.7 3.8  CL 92*  --   --   --  98* 99*  CO2 28  --   --   --  24 28  GLUCOSE 131* 181* 243*  --  175* 123*  BUN 11  --   --   --  9 6  CREATININE 0.83  --   --   --  0.88 0.68  CALCIUM 9.3  --   --   --  8.5* 9.2    CBG: No results for input(s): GLUCAP in the last 168 hours.  GFR Estimated Creatinine Clearance: 76.8 mL/min (by C-G formula based on Cr of 0.68).  Coagulation profile No results for input(s): INR, PROTIME in the last 168 hours.  Cardiac Enzymes No results for input(s): CKMB, TROPONINI, MYOGLOBIN in the last 168 hours.  Invalid input(s): CK  Invalid input(s): POCBNP No results for input(s): DDIMER in the last 72 hours.  Recent Labs  03/20/15 1044  HGBA1C 6.1*   No results for input(s): CHOL, HDL, LDLCALC, TRIG, CHOLHDL, LDLDIRECT in the last 72 hours.  Recent Labs  03/20/15 1044  TSH 3.595   No results for input(s): VITAMINB12, FOLATE, FERRITIN, TIBC, IRON, RETICCTPCT in the last 72 hours. No results for input(s): LIPASE, AMYLASE in the last 72 hours.  Urine Studies No results for input(s): UHGB, CRYS in the last 72 hours.  Invalid input(s): UACOL, UAPR, USPG, UPH, UTP, UGL, UKET, UBIL, UNIT, UROB, ULEU, UEPI, UWBC, URBC, UBAC, CAST, UCOM, BILUA  MICROBIOLOGY: Recent Results (from the past 240 hour(s))  Body fluid culture     Status: None  Collection Time: 03/19/15  2:02 PM  Result Value Ref Range  Status   Specimen Description FLUID SYNOVIAL RIGHT HIP  Final   Special Requests FLUID ON SWAB  Final   Gram Stain   Final    ABUNDANT WBC PRESENT,BOTH PMN AND MONONUCLEAR NO ORGANISMS SEEN    Culture   Final    FEW STAPHYLOCOCCUS AUREUS CRITICAL RESULT CALLED TO, READ BACK BY AND VERIFIED WITH: B SCOTT 03/20/15 @ M VESTAL    Report Status 03/21/2015 FINAL  Final   Organism ID, Bacteria STAPHYLOCOCCUS AUREUS  Final      Susceptibility   Staphylococcus aureus - MIC*    CIPROFLOXACIN <=0.5 SENSITIVE Sensitive     ERYTHROMYCIN <=0.25 SENSITIVE Sensitive     GENTAMICIN <=0.5 SENSITIVE Sensitive     OXACILLIN 0.5 SENSITIVE Sensitive     TETRACYCLINE <=1 SENSITIVE Sensitive     VANCOMYCIN <=0.5 SENSITIVE Sensitive     TRIMETH/SULFA <=10 SENSITIVE Sensitive     CLINDAMYCIN <=0.25 SENSITIVE Sensitive     RIFAMPIN <=0.5 SENSITIVE Sensitive     Inducible Clindamycin NEGATIVE Sensitive     * FEW STAPHYLOCOCCUS AUREUS  Gram stain     Status: None   Collection Time: 03/19/15  2:02 PM  Result Value Ref Range Status   Specimen Description FLUID SYNOVIAL RIGHT HIP  Final   Special Requests FLUID ON SWAB  Final   Gram Stain   Final    ABUNDANT WBC PRESENT,BOTH PMN AND MONONUCLEAR NO ORGANISMS SEEN    Report Status 03/19/2015 FINAL  Final  Anaerobic culture     Status: None (Preliminary result)   Collection Time: 03/19/15  2:02 PM  Result Value Ref Range Status   Specimen Description FLUID SYNOVIAL RIGHT HIP  Final   Special Requests FLUID ON SWAB  Final   Gram Stain   Final    ABUNDANT WBC PRESENT,BOTH PMN AND MONONUCLEAR NO ORGANISMS SEEN    Culture   Final    NO ANAEROBES ISOLATED; CULTURE IN PROGRESS FOR 5 DAYS   Report Status PENDING  Incomplete  Tissue culture     Status: None (Preliminary result)   Collection Time: 03/19/15  3:51 PM  Result Value Ref Range Status   Specimen Description TISSUE RIGHT HIP  Final   Special Requests NONE  Final   Gram Stain   Final    NO WBC  SEEN NO ORGANISMS SEEN Performed at [facility]    Culture   Final    NO GROWTH 1 DAY Performed at [facility]    Report Status PENDING  Incomplete    RADIOLOGY STUDIES/RESULTS: Dg Pelvis Portable  03/19/2015  CLINICAL DATA:  Prosthetic hip infection, postoperative revision. EXAM: PORTABLE PELVIS 1-2 VIEWS COMPARISON:  11/25/2014 and fluoroscopic spot images from 11/20 2/16 FINDINGS: A new right hip prosthesis is in place with some cerclage wires holding the proximal femoral fragment in place, and with the stem extending into the distal femoral shaft fragment. Scattered nodular densities compatible with antibiotic beads in the region of the operative bed and implant. Along the acetabulum there is a bony density shell spacer in place aligned with the axis of the prosthetic femoral head. Rectal staple line noted. Mild degenerative arthropathy of the left hip. IMPRESSION: 1. New right hip implant in place, suspected separate greater trochanteric fragment and shaft fragment, with cerclage wires noted in the vicinity of the trochanteric fragment. Antibiotic beads in place. Bone density shell spacer along the acetabulum. No significant malalignment.  Electronically Signed   By: Van Clines M.D.   On: 03/19/2015 21:20   Dg Hip Operative Unilat With Pelvis Right  03/19/2015  CLINICAL DATA:  Right total hip arthroplasty revision EXAM: OPERATIVE RIGHT HIP (WITH PELVIS IF PERFORMED) 1 VIEWS TECHNIQUE: Fluoroscopic spot image(s) were submitted for interpretation post-operatively. COMPARISON:  11/23/2014 FINDINGS: Limited spot fluoroscopic intraoperative view during the total hip revision. Hardware has been revised. Cerclage wire about the intertrochanteric region. Alignment with the acetabulum is difficult to assess because of the projection and only one view. IMPRESSION: Limited operative imaging during right hip arthroplasty revision. Electronically Signed   By: Jerilynn Mages.  Shick M.D.   On:  03/19/2015 19:04    Oren Binet, MD  [facility] Pager:[phone]  If 7PM-7AM, please contact night-coverage [email or web address] Password [email or web address] 03/21/2015, 12:12 PM   LOS: 2 days

## 2015-03-21 NOTE — Progress Notes (Signed)
[facility]    Date of Admission:  03/19/2015   Total days of antibiotics 3        Day 3 cefazolin          ID: Christina French is a 59 y.o. female with  MSSA prosthetic hip infection POD#2 s/p explant now with new abtx spacer  Principal Problem:   Chronic hyponatremia Active Problems:   Seizure disorder (HCC)   HTN (hypertension)   HLD (hyperlipidemia)   Prosthetic hip infection (HCC)   Anxiety disorder   Acute hyperglycemia   Pulse irregularity   Dehydration    Subjective: Having improved control of pain from right hip  ROS: nausea associated with pain meds, and low back pain from being supine  Medications:  . carbamazepine  300 mg Oral BID  . escitalopram  20 mg Oral Daily  . feeding supplement (ENSURE ENLIVE)  237 mL Oral TID BM  . mirtazapine  30 mg Oral QHS  . pantoprazole  40 mg Oral Daily  . rivaroxaban  10 mg Oral Daily  . traZODone  50 mg Oral QHS    Objective: Vital signs in last 24 hours: Temp:  [98.1 F (36.7 C)-99.4 F (37.4 C)] 98.4 F (36.9 C) (11/24 0445) Pulse Rate:  [68-98] 72 (11/24 0445) Resp:  [16-18] 18 (11/24 0445) BP: (110-136)/(46-72) 118/64 mmHg (11/24 0445) SpO2:  [98 %-100 %] 98 % (11/24 0445)  Physical Exam  Constitutional:  oriented to person, place, and time. appears well-developed and well-nourished. No distress. Sleeping on left side HENT: [place]/AT, PERRLA, no scleral icterus Mouth/Throat: Oropharynx is clear and moist. No oropharyngeal exudate.  Cardiovascular: Normal rate, regular rhythm and normal heart sounds. Exam reveals no gallop and no friction rub.  No murmur heard.  Pulmonary/Chest: Effort normal and breath sounds normal. No respiratory distress.  has no wheezes.  Skin: Skin is warm and dry.right hip dressing, no drainage Psychiatric: a normal mood and affect.  behavior is normal.    Lab Results  Recent Labs  03/20/15 0545 03/21/15 0327 03/21/15 0346  WBC 10.3  --   --   HGB 7.0*  --   8.8*  HCT 20.5*  --  26.0*  NA 130* 132*  --   K 4.7 3.8  --   CL 98* 99*  --   CO2 24 28  --   BUN 9 6  --   CREATININE 0.88 0.68  --     Microbiology: 11/22 synovial fluid MSSA 11/22 tissue cx pending  Studies/Results: Dg Pelvis Portable  03/19/2015  CLINICAL DATA:  Prosthetic hip infection, postoperative revision. EXAM: PORTABLE PELVIS 1-2 VIEWS COMPARISON:  11/25/2014 and fluoroscopic spot images from 11/20 2/16 FINDINGS: A new right hip prosthesis is in place with some cerclage wires holding the proximal femoral fragment in place, and with the stem extending into the distal femoral shaft fragment. Scattered nodular densities compatible with antibiotic beads in the region of the operative bed and implant. Along the acetabulum there is a bony density shell spacer in place aligned with the axis of the prosthetic femoral head. Rectal staple line noted. Mild degenerative arthropathy of the left hip. IMPRESSION: 1. New right hip implant in place, suspected separate greater trochanteric fragment and shaft fragment, with cerclage wires noted in the vicinity of the trochanteric fragment. Antibiotic beads in place. Bone density shell spacer along the acetabulum. No significant malalignment. Electronically Signed   By: Van Clines M.D.   On: 03/19/2015  21:20   Dg Hip Operative Unilat With Pelvis Right  03/19/2015  CLINICAL DATA:  Right total hip arthroplasty revision EXAM: OPERATIVE RIGHT HIP (WITH PELVIS IF PERFORMED) 1 VIEWS TECHNIQUE: Fluoroscopic spot image(s) were submitted for interpretation post-operatively. COMPARISON:  11/23/2014 FINDINGS: Limited spot fluoroscopic intraoperative view during the total hip revision. Hardware has been revised. Cerclage wire about the intertrochanteric region. Alignment with the acetabulum is difficult to assess because of the projection and only one view. IMPRESSION: Limited operative imaging during right hip arthroplasty revision. Electronically Signed    By: Jerilynn Mages.  Shick M.D.   On: 03/19/2015 19:04     Assessment/Plan: MSSA prosthetic hip infection = will need IV cefazolin 2gm iv q 8hr for next 6 wk using 11/23 as day 1 of 42  Nausea = can consider pretreatment with zofran before getting pain meds  Chronic hyponatremia = appears improving  ------------------home health IV abtx orders-------------------- Diagnosis: Prosthetic joint infection of right hip  Culture Result: MSSA  No Known Allergies  Discharge antibiotics: cefazolin Per pharmacy protocol  Duration: 6 wk End Date: January 6th   [place] Per Protocol: Labs weekly while on IV antibiotics: x_ CBC with differential x_ CMP x_ CRP -- on last week of abtx, early jan x_ ESR-- on last week of abtx, early jan   Fax weekly labs to [phone]  Clinic Follow Up Appt:  in ID clinic in 5-6 wk with Dr. Illa Level, [facility] Cell: [phone] Pager: [phone]  03/21/2015, 11:52 AM

## 2015-03-21 NOTE — Progress Notes (Signed)
ANTIBIOTIC CONSULT NOTE - INITIAL  Pharmacy Consult for Cefazolin  Indication: Prosthetic right hip infection   No Known Allergies  Patient Measurements: Height: 5\' 1"  (154.9 cm) Weight: 187 lb 4.8 oz (84.959 kg) IBW/kg (Calculated) : 47.8  Vital Signs: Temp: 98.4 F (36.9 C) (11/24 0445) Temp Source: Oral (11/24 0445) BP: 118/64 mmHg (11/24 0445) Pulse Rate: 72 (11/24 0445) Intake/Output from previous day: 11/23 0701 - 11/24 0700 In: 1581.7 [P.O.:530; Blood:1051.7] Out: 150 [Urine:150] Intake/Output from this shift: Total I/O In: 240 [P.O.:240] Out: 350 [Urine:350]  Labs:  Recent Labs  03/19/15 1240  03/19/15 1744 03/20/15 0545 03/21/15 0327 03/21/15 0346  WBC  --   --   --  10.3  --   --   HGB  --   < > 8.2* 7.0*  --  8.8*  PLT  --   --   --  170  --   --   CREATININE 0.83  --   --  0.88 0.68  --   < > = values in this interval not displayed. Estimated Creatinine Clearance: 76.8 mL/min (by C-G formula based on Cr of 0.68). No results for input(s): VANCOTROUGH, VANCOPEAK, VANCORANDOM, GENTTROUGH, GENTPEAK, GENTRANDOM, TOBRATROUGH, TOBRAPEAK, TOBRARND, AMIKACINPEAK, AMIKACINTROU, AMIKACIN in the last 72 hours.   Microbiology: Recent Results (from the past 720 hour(s))  Surgical pcr screen     Status: None   Collection Time: 03/08/15  1:46 PM  Result Value Ref Range Status   MRSA, PCR NEGATIVE NEGATIVE Final   Staphylococcus aureus NEGATIVE NEGATIVE Final    Comment:        The Xpert SA Assay (FDA approved for NASAL specimens in patients over 58 years of age), is one component of a comprehensive surveillance program.  Test performance has been validated by [facility] for patients greater than or equal to 58 year old. It is not intended to diagnose infection nor to guide or monitor treatment.   Urine culture     Status: None   Collection Time: 03/08/15  2:02 PM  Result Value Ref Range Status   Specimen Description URINE, CLEAN CATCH  Final   Special  Requests NONE  Final   Culture 2,000 COLONIES/mL INSIGNIFICANT GROWTH  Final   Report Status 03/09/2015 FINAL  Final  Body fluid culture     Status: None   Collection Time: 03/19/15  2:02 PM  Result Value Ref Range Status   Specimen Description FLUID SYNOVIAL RIGHT HIP  Final   Special Requests FLUID ON SWAB  Final   Gram Stain   Final    ABUNDANT WBC PRESENT,BOTH PMN AND MONONUCLEAR NO ORGANISMS SEEN    Culture   Final    FEW STAPHYLOCOCCUS AUREUS CRITICAL RESULT CALLED TO, READ BACK BY AND VERIFIED WITH: B SCOTT 03/20/15 @ M VESTAL    Report Status 03/21/2015 FINAL  Final   Organism ID, Bacteria STAPHYLOCOCCUS AUREUS  Final      Susceptibility   Staphylococcus aureus - MIC*    CIPROFLOXACIN <=0.5 SENSITIVE Sensitive     ERYTHROMYCIN <=0.25 SENSITIVE Sensitive     GENTAMICIN <=0.5 SENSITIVE Sensitive     OXACILLIN 0.5 SENSITIVE Sensitive     TETRACYCLINE <=1 SENSITIVE Sensitive     VANCOMYCIN <=0.5 SENSITIVE Sensitive     TRIMETH/SULFA <=10 SENSITIVE Sensitive     CLINDAMYCIN <=0.25 SENSITIVE Sensitive     RIFAMPIN <=0.5 SENSITIVE Sensitive     Inducible Clindamycin NEGATIVE Sensitive     * FEW STAPHYLOCOCCUS AUREUS  Gram stain     Status: None   Collection Time: 03/19/15  2:02 PM  Result Value Ref Range Status   Specimen Description FLUID SYNOVIAL RIGHT HIP  Final   Special Requests FLUID ON SWAB  Final   Gram Stain   Final    ABUNDANT WBC PRESENT,BOTH PMN AND MONONUCLEAR NO ORGANISMS SEEN    Report Status 03/19/2015 FINAL  Final  Anaerobic culture     Status: None (Preliminary result)   Collection Time: 03/19/15  2:02 PM  Result Value Ref Range Status   Specimen Description FLUID SYNOVIAL RIGHT HIP  Final   Special Requests FLUID ON SWAB  Final   Gram Stain   Final    ABUNDANT WBC PRESENT,BOTH PMN AND MONONUCLEAR NO ORGANISMS SEEN    Culture   Final    NO ANAEROBES ISOLATED; CULTURE IN PROGRESS FOR 5 DAYS   Report Status PENDING  Incomplete  Tissue culture      Status: None (Preliminary result)   Collection Time: 03/19/15  3:51 PM  Result Value Ref Range Status   Specimen Description TISSUE RIGHT HIP  Final   Special Requests NONE  Final   Gram Stain   Final    NO WBC SEEN NO ORGANISMS SEEN Performed at [facility]    Culture   Final    NO GROWTH 1 DAY Performed at [facility]    Report Status PENDING  Incomplete    Medical History: Past Medical History  Diagnosis Date  . Hypertension   . Anemia   . Anxiety   . Depression   . Seizures ([place])     rare - last one 07/30/14 - was very anemic  . GERD (gastroesophageal reflux disease)   . IBS (irritable bowel syndrome)   . Barrett esophagus   . Vertigo     thinks related to sinus issues  . Cancer ([place])   . Renal insufficiency     reports acute kidney injury  . History of pneumonia   . History of bronchitis   . Arthritis     Assessment: 59 YOF with MSSA prosthetic right hip infection.  Cefazolin day# 2/42 per ID. WBC 10.3, afebrile. sCr stable   11/22 ancef>>   11/22 R Hip Fluid Cx>>MSSA   Plan:  - Cefazolin 2g IV Q8h  - Monitor renal fxn, LOT    Jaidan Prevette C. Lennox Grumbles, PharmD Pharmacy Resident  Pager: [phone] 03/21/2015 2:11 PM

## 2015-03-22 LAB — BASIC METABOLIC PANEL
ANION GAP: 10 (ref 5–15)
BUN: 7 mg/dL (ref 6–20)
CHLORIDE: 91 mmol/L — AB (ref 101–111)
CO2: 27 mmol/L (ref 22–32)
CREATININE: 0.76 mg/dL (ref 0.44–1.00)
Calcium: 9.4 mg/dL (ref 8.9–10.3)
GFR calc non Af Amer: >60 mL/min (ref >60)
Glucose, Bld: 131 mg/dL — ABNORMAL HIGH (ref 65–99)
POTASSIUM: 3.3 mmol/L — AB (ref 3.5–5.1)
Sodium: 128 mmol/L — ABNORMAL LOW (ref 135–145)

## 2015-03-22 LAB — CBC
HEMATOCRIT: 27.2 % — AB (ref 36.0–46.0)
HEMOGLOBIN: 9.1 g/dL — AB (ref 12.0–15.0)
MCH: 27.2 pg (ref 26.0–34.0)
MCHC: 33.5 g/dL (ref 30.0–36.0)
MCV: 81.2 fL (ref 78.0–100.0)
Platelets: 163 10*3/uL (ref 150–400)
RBC: 3.35 MIL/uL — AB (ref 3.87–5.11)
RDW: 14.1 % (ref 11.5–15.5)
WBC: 7.1 10*3/uL (ref 4.0–10.5)

## 2015-03-22 LAB — PROTIME-INR
INR: 1.47 (ref 0.00–1.49)
Prothrombin Time: 17.9 seconds — ABNORMAL HIGH (ref 11.6–15.2)

## 2015-03-22 MED ORDER — POTASSIUM CHLORIDE CRYS ER 20 MEQ PO TBCR
40.0000 meq | EXTENDED_RELEASE_TABLET | Freq: Once | ORAL | Status: AC
  Administered 2015-03-22: 40 meq via ORAL
  Filled 2015-03-22: qty 2

## 2015-03-22 MED ORDER — WARFARIN SODIUM 5 MG PO TABS: 5.0000 mg | ORAL_TABLET | Freq: Once | ORAL | Status: DC

## 2015-03-22 MED ORDER — WARFARIN - PHARMACIST DOSING INPATIENT: Freq: Every day | Status: DC

## 2015-03-22 MED ORDER — PATIENT'S GUIDE TO USING COUMADIN BOOK
Freq: Once | Status: AC
  Administered 2015-03-22: 19:00:00
  Filled 2015-03-22: qty 1

## 2015-03-22 MED ORDER — FUROSEMIDE 20 MG PO TABS
20.0000 mg | ORAL_TABLET | Freq: Every day | ORAL | Status: DC
  Filled 2015-03-22: qty 1

## 2015-03-22 MED ORDER — SODIUM CHLORIDE 0.9 % IJ SOLN
10.0000 mL | INTRAMUSCULAR | Status: DC | PRN
  Administered 2015-03-23: 10 mL
  Filled 2015-03-22: qty 40

## 2015-03-22 MED ORDER — WARFARIN VIDEO
Freq: Once | Status: DC
Start: 2015-03-22 — End: 2015-03-23

## 2015-03-22 NOTE — NC FL2 (Signed)
[place] LEVEL OF CARE SCREENING TOOL     IDENTIFICATION  Patient Name: Christina French Birthdate: 02/01/1956 Sex: female Admission Date (Current Location): 03/19/2015  [facility] and [place] Number:     Facility and Address:  [facility] [place], [place] [address] [place],  [address]      Provider Number: [id number]  Attending Physician Name and Address:  Meredith Pel, MD  Relative Name and Phone Number:       Current Level of Care: Hospital Recommended Level of Care: [place] Prior Approval Number:    Date Approved/Denied:   PASRR Number: [id number]  Discharge Plan: SNF    Current Diagnoses: Patient Active Problem List   Diagnosis Date Noted  . Anxiety disorder 03/20/2015  . Acute hyperglycemia 03/20/2015  . Pulse irregularity 03/20/2015  . Dehydration 03/20/2015  . Septic arthritis ([place])   . Prosthetic hip infection ([place])   . Staphylococcus aureus infection   . Enteritis due to Clostridium difficile   . Screen for STD (sexually transmitted disease)   . Acute pain of right hip; hip infection 11/24/2014  . Right hip pain 11/24/2014  . Chronic hyponatremia 11/24/2014  . Seizure disorder ([place]) 08/17/2013  . Digestive disorder 08/17/2013  . HTN (hypertension) 08/17/2013  . HLD (hyperlipidemia) 08/17/2013    Orientation ACTIVITIES/SOCIAL BLADDER RESPIRATION    Self, Time, Situation, Place  Family supportive Continent Normal  BEHAVIORAL SYMPTOMS/MOOD NEUROLOGICAL BOWEL NUTRITION STATUS  Other (Comment) (n/a)  (n/a) Continent Diet (Please see discharge summary.)  PHYSICIAN VISITS COMMUNICATION OF NEEDS Height & Weight Skin    Verbally 5\' 1"  (154.9 cm) 187 lbs. Surgical wounds          AMBULATORY STATUS RESPIRATION    Assist extensive Normal      Personal Care Assistance Level of Assistance  Bathing, Feeding, Dressing Bathing Assistance: Limited assistance Feeding assistance: Limited  assistance Dressing Assistance: Limited assistance      Functional Limitations Info   (n/a)             [place]  PT (By licensed PT), OT (By licensed OT)     PT Frequency: 5 OT Frequency: 5           Additional Factors Info  Code Status, Allergies Code Status Info: FULL Allergies Info: No known allergies.           Current Medications (03/22/2015): Current Facility-Administered Medications  Medication Dose Route Frequency Provider Last Rate Last Dose  . acetaminophen (TYLENOL) tablet 650 mg  650 mg Oral Q6H PRN Meredith Pel, MD   650 mg at 03/21/15 1734   Or  . acetaminophen (TYLENOL) suppository 650 mg  650 mg Rectal Q6H PRN Meredith Pel, MD      . ALPRAZolam Duanne Moron) tablet 1 mg  1 mg Oral TID PRN Meredith Pel, MD   1 mg at 03/20/15 2333  . carbamazepine (TEGRETOL) tablet 300 mg  300 mg Oral BID Meredith Pel, MD   300 mg at 03/22/15 1107  . ceFAZolin (ANCEF) IVPB 2 g/50 mL premix  2 g Intravenous 3 times per day Carlyle Basques, MD   2 g at 03/22/15 0420  . diphenhydrAMINE (BENADRYL) capsule 25 mg  25 mg Oral Q8H PRN Jonetta Osgood, MD   25 mg at 03/22/15 0420  . escitalopram (LEXAPRO) tablet 20 mg  20 mg Oral Daily Meredith Pel, MD   20 mg at 03/22/15 1107  .  feeding supplement (ENSURE ENLIVE) (ENSURE ENLIVE) liquid 237 mL  237 mL Oral TID BM Dale Stamford, RD   237 mL at 03/22/15 1108  . methocarbamol (ROBAXIN) tablet 500 mg  500 mg Oral Q6H PRN Meredith Pel, MD   500 mg at 03/22/15 [id number]   Or  . methocarbamol (ROBAXIN) 500 mg in dextrose 5 % 50 mL IVPB  500 mg Intravenous Q6H PRN Meredith Pel, MD      . metoCLOPramide (REGLAN) tablet 5-10 mg  5-10 mg Oral Q8H PRN Meredith Pel, MD       Or  . metoCLOPramide (REGLAN) injection 5-10 mg  5-10 mg Intravenous Q8H PRN Meredith Pel, MD   10 mg at 03/20/15 0254  . mirtazapine (REMERON) tablet 30 mg  30 mg Oral QHS Meredith Pel, MD   30  mg at 03/21/15 2128  . morphine 4 MG/ML injection 4 mg  4 mg Intravenous Q2H PRN Meredith Pel, MD   4 mg at 03/20/15 1648  . ondansetron (ZOFRAN) tablet 4 mg  4 mg Oral Q6H PRN Meredith Pel, MD       Or  . ondansetron [facility]) injection 4 mg  4 mg Intravenous Q6H PRN Meredith Pel, MD   4 mg at 03/20/15 0855  . oxyCODONE (Oxy IR/ROXICODONE) immediate release tablet 5-10 mg  5-10 mg Oral Q3H PRN Meredith Pel, MD   10 mg at 03/22/15 0902  . pantoprazole (PROTONIX) EC tablet 40 mg  40 mg Oral Daily Meredith Pel, MD   40 mg at 03/22/15 1107  . rivaroxaban (XARELTO) tablet 10 mg  10 mg Oral Daily Meredith Pel, MD   10 mg at 03/22/15 1107  . traZODone (DESYREL) tablet 50 mg  50 mg Oral QHS Meredith Pel, MD   50 mg at 03/21/15 2128   Do not use this list as official medication orders. Please verify with discharge summary.  Discharge Medications:   Medication List    TAKE these medications        ALPRAZolam 1 MG tablet  Commonly known as:  XANAX  Take 1 mg by mouth 3 (three) times daily as needed for anxiety.     atenolol 50 MG tablet  Commonly known as:  TENORMIN  Take 50 mg by mouth 2 (two) times daily.     carbamazepine 200 MG tablet  Commonly known as:  TEGRETOL  Take 300 mg by mouth 2 (two) times daily.     ceFAZolin 2-3 GM-% Solr  Commonly known as:  ANCEF  Inject 50 mLs (2 g total) into the vein every 8 (eight) hours.     dexlansoprazole 60 MG capsule  Commonly known as:  DEXILANT  Take 60 mg by mouth daily.     escitalopram 20 MG tablet  Commonly known as:  LEXAPRO  Take 20 mg by mouth daily.     hyoscyamine 0.125 MG tablet  Commonly known as:  LEVSIN, ANASPAZ  Take 1 tablet (0.125 mg total) by mouth 4 (four) times daily as needed for cramping.     ibuprofen 200 MG tablet  Commonly known as:  ADVIL,MOTRIN  Take 200 mg by mouth every 4 (four) hours as needed for headache or mild pain.     mirtazapine 15 MG tablet  Commonly  known as:  REMERON  Take 30 mg by mouth at bedtime.     ondansetron 8 MG tablet  Commonly known as:  ZOFRAN  Take 8 mg by mouth 2 (two) times daily as needed for nausea or vomiting.     oxyCODONE 5 MG immediate release tablet  Commonly known as:  Oxy IR/ROXICODONE  Take 1-2 tablets (5-10 mg total) by mouth every 3 (three) hours as needed for breakthrough pain.     rivaroxaban 10 MG Tabs tablet  Commonly known as:  XARELTO  Take 1 tablet (10 mg total) by mouth daily.     traZODone 50 MG tablet  Commonly known as:  DESYREL  Take 50 mg by mouth at bedtime.        Relevant Imaging Results:  Relevant Lab Results:  Recent Labs    Additional Information Social Security #: [id number]  Luna Kitchens [phone]

## 2015-03-22 NOTE — Progress Notes (Signed)
ANTICOAGULATION CONSULT NOTE - Initial Consult  Pharmacy Consult for Coumadin (asked to change from Xarelto due to drug interaction with Tegretol) Indication: VTE prophylaxis  No Known Allergies  Patient Measurements: Height: 5\' 1"  (154.9 cm) Weight: 187 lb 4.8 oz (84.959 kg) IBW/kg (Calculated) : 47.8 Heparin Dosing Weight: n/a  Vital Signs: Temp: 99.1 F (37.3 C) (11/25 1630) Temp Source: Oral (11/25 1630) BP: 123/61 mmHg (11/25 1630) Pulse Rate: 68 (11/25 1630)  Labs:  Recent Labs  03/20/15 0545 03/21/15 0327 03/21/15 0346 03/22/15 0402 03/22/15 1827  HGB 7.0*  --  8.8* 9.1*  --   HCT 20.5*  --  26.0* 27.2*  --   PLT 170  --   --  163  --   LABPROT  --   --   --   --  17.9*  INR  --   --   --   --  1.47  CREATININE 0.88 0.68  --  0.76  --     Estimated Creatinine Clearance: 76.8 mL/min (by C-G formula based on Cr of 0.76).   Medical History: Past Medical History  Diagnosis Date  . Hypertension   . Anemia   . Anxiety   . Depression   . Seizures ([place])     rare - last one 07/30/14 - was very anemic  . GERD (gastroesophageal reflux disease)   . IBS (irritable bowel syndrome)   . Barrett esophagus   . Vertigo     thinks related to sinus issues  . Cancer ([place])   . Renal insufficiency     reports acute kidney injury  . History of pneumonia   . History of bronchitis   . Arthritis     Medications:  Scheduled:  . carbamazepine  300 mg Oral BID  .  ceFAZolin (ANCEF) IV  2 g Intravenous 3 times per day  . escitalopram  20 mg Oral Daily  . feeding supplement (ENSURE ENLIVE)  237 mL Oral TID BM  . furosemide  20 mg Oral Daily  . mirtazapine  30 mg Oral QHS  . pantoprazole  40 mg Oral Daily  . traZODone  50 mg Oral QHS  . Warfarin - Pharmacist Dosing Inpatient   Does not apply q1800    Assessment: 59 yo female s/p R THA revision.  Was initially started on Xarelto for DVT px, but due to drug interaction with Tegretol, now switching to Coumadin.  Tegretol  can reduce the AUC of Xarelto by up to 50%.  Baseline INR 1.47, but Xarelto can artificially influence INR, so difficult to interpret.  Has received Xarelto 10 mg x 3 doses.  Goal of Therapy:  INR 2-3 Monitor platelets by anticoagulation protocol: Yes   Plan:  1. Coumadin 5 mg x 1 now. 2. Daily PT/INR. 3. Needs Coumadin education prior to discharge. 4. Consider adding Lovenox tomorrow for bridging.  Uvaldo Rising, BCPS  Clinical Pharmacist Pager [phone]  03/22/2015 7:12 PM

## 2015-03-22 NOTE — Discharge Summary (Signed)
Physician Discharge Summary  Patient ID: Christina French MRN: [id number] DOB/AGE: 1956/04/24 59 y.o.  Admit date: 03/19/2015 Discharge date: 03/23/2015  Admission Diagnoses:  Principal Problem:   Chronic hyponatremia Active Problems:   Seizure disorder (HCC)   HTN (hypertension)   HLD (hyperlipidemia)   Prosthetic hip infection (HCC)   Anxiety disorder   Acute hyperglycemia   Pulse irregularity   Dehydration   Discharge Diagnoses:  Same  Surgeries: Procedure(s): RIGHT TOTAL HIP ARTHROPLASTY REVISION on 03/19/2015   Consultants: Treatment Team:  Minerva Ends, MD  Discharged Condition: Stable  Hospital Course: Christina French is an 59 y.o. female who was admitted 03/19/2015 with a chief complaint of right hip infection, and found to have a diagnosis of right hip infection.  They were brought to the operating room on 03/19/2015 and underwent the above named procedures. Interop cxs positive for staph. Extraction performed. Prostaloc performed with additional abx beads.Plan reimplant 6 weeks.dced to snf tdwb for transfers only  Rle. Home on iv abx  Antibiotics given:  Anti-infectives    Start     Dose/Rate Route Frequency Ordered Stop   03/21/15 1415  ceFAZolin (ANCEF) IVPB 2 g/50 mL premix     2 g 100 mL/hr over 30 Minutes Intravenous 3 times per day 03/21/15 1406     03/21/15 0000  ceFAZolin (ANCEF) 2-3 GM-% SOLR     2 g 100 mL/hr over 30 Minutes Intravenous Every 8 hours 03/21/15 2251     03/20/15 0030  ceFAZolin (ANCEF) IVPB 2 g/50 mL premix     2 g 100 mL/hr over 30 Minutes Intravenous Every 6 hours 03/19/15 2138 03/20/15 1546   03/19/15 1842  vancomycin (VANCOCIN) powder  Status:  Discontinued       As needed 03/19/15 1842 03/19/15 1944   03/19/15 1842  gentamicin (GARAMYCIN) injection  Status:  Discontinued       As needed 03/19/15 1843 03/19/15 1944   03/19/15 1730  tobramycin (NEBCIN) powder  Status:  Discontinued       As needed 03/19/15 1755 03/19/15 1944    03/19/15 1730  vancomycin (VANCOCIN) powder  Status:  Discontinued       As needed 03/19/15 1758 03/19/15 1944   03/19/15 1545  tobramycin (NEBCIN) powder 2.4 g  Status:  Discontinued     2.4 g Topical To Surgery 03/19/15 1537 03/19/15 2138   03/19/15 1315  ceFAZolin (ANCEF) IVPB 2 g/50 mL premix     2 g 100 mL/hr over 30 Minutes Intravenous To ShortStay Surgical 03/18/15 1356 03/19/15 1824    .  Recent vital signs:  Filed Vitals:   03/21/15 2027 03/22/15 0610  BP: 128/51 119/42  Pulse: 71 75  Temp: 98.2 F (36.8 C) 98.5 F (36.9 C)  Resp: 17 16    Recent laboratory studies:  Results for orders placed or performed during the hospital encounter of 03/19/15  Body fluid culture  Result Value Ref Range   Specimen Description FLUID SYNOVIAL RIGHT HIP    Special Requests FLUID ON SWAB    Gram Stain      ABUNDANT WBC PRESENT,BOTH PMN AND MONONUCLEAR NO ORGANISMS SEEN    Culture      FEW STAPHYLOCOCCUS AUREUS CRITICAL RESULT CALLED TO, READ BACK BY AND VERIFIED WITH: B SCOTT 03/20/15 @ M VESTAL    Report Status 03/21/2015 FINAL    Organism ID, Bacteria STAPHYLOCOCCUS AUREUS       Susceptibility   Staphylococcus aureus - MIC*  CIPROFLOXACIN <=0.5 SENSITIVE Sensitive     ERYTHROMYCIN <=0.25 SENSITIVE Sensitive     GENTAMICIN <=0.5 SENSITIVE Sensitive     OXACILLIN 0.5 SENSITIVE Sensitive     TETRACYCLINE <=1 SENSITIVE Sensitive     VANCOMYCIN <=0.5 SENSITIVE Sensitive     TRIMETH/SULFA <=10 SENSITIVE Sensitive     CLINDAMYCIN <=0.25 SENSITIVE Sensitive     RIFAMPIN <=0.5 SENSITIVE Sensitive     Inducible Clindamycin NEGATIVE Sensitive     * FEW STAPHYLOCOCCUS AUREUS  Tissue culture  Result Value Ref Range   Specimen Description TISSUE RIGHT HIP    Special Requests NONE    Gram Stain      NO WBC SEEN NO ORGANISMS SEEN Performed at [facility]    Culture      NO GROWTH 2 DAYS Performed at [facility]    Report Status PENDING   Gram stain   Result Value Ref Range   Specimen Description FLUID SYNOVIAL RIGHT HIP    Special Requests FLUID ON SWAB    Gram Stain      ABUNDANT WBC PRESENT,BOTH PMN AND MONONUCLEAR NO ORGANISMS SEEN    Report Status 03/19/2015 FINAL   Anaerobic culture  Result Value Ref Range   Specimen Description FLUID SYNOVIAL RIGHT HIP    Special Requests FLUID ON SWAB    Gram Stain      ABUNDANT WBC PRESENT,BOTH PMN AND MONONUCLEAR NO ORGANISMS SEEN    Culture      NO ANAEROBES ISOLATED; CULTURE IN PROGRESS FOR 5 DAYS   Report Status PENDING   Basic metabolic panel  Result Value Ref Range   Sodium 128 (L) 135 - 145 mmol/L   Potassium 4.2 3.5 - 5.1 mmol/L   Chloride 92 (L) 101 - 111 mmol/L   CO2 28 22 - 32 mmol/L   Glucose, Bld 131 (H) 65 - 99 mg/dL   BUN 11 6 - 20 mg/dL   Creatinine, Ser 0.83 0.44 - 1.00 mg/dL   Calcium 9.3 8.9 - 10.3 mg/dL   GFR calc non Af Amer >60 >60 mL/min   GFR calc Af Amer >60 >60 mL/min   Anion gap 8 5 - 15  Basic metabolic panel  Result Value Ref Range   Sodium 130 (L) 135 - 145 mmol/L   Potassium 4.7 3.5 - 5.1 mmol/L   Chloride 98 (L) 101 - 111 mmol/L   CO2 24 22 - 32 mmol/L   Glucose, Bld 175 (H) 65 - 99 mg/dL   BUN 9 6 - 20 mg/dL   Creatinine, Ser 0.88 0.44 - 1.00 mg/dL   Calcium 8.5 (L) 8.9 - 10.3 mg/dL   GFR calc non Af Amer >60 >60 mL/min   GFR calc Af Amer >60 >60 mL/min   Anion gap 8 5 - 15  CBC with Differential/Platelet  Result Value Ref Range   WBC 10.3 4.0 - 10.5 K/uL   RBC 2.61 (L) 3.87 - 5.11 MIL/uL   Hemoglobin 7.0 (L) 12.0 - 15.0 g/dL   HCT 20.5 (L) 36.0 - 46.0 %   MCV 78.5 78.0 - 100.0 fL   MCH 26.8 26.0 - 34.0 pg   MCHC 34.1 30.0 - 36.0 g/dL   RDW 13.5 11.5 - 15.5 %   Platelets 170 150 - 400 K/uL   Neutrophils Relative % 86 %   Neutro Abs 8.9 (H) 1.7 - 7.7 K/uL   Lymphocytes Relative 6 %   Lymphs Abs 0.7 0.7 - 4.0 K/uL   Monocytes  Relative 8 %   Monocytes Absolute 0.8 0.1 - 1.0 K/uL   Eosinophils Relative 0 %   Eosinophils Absolute  0.0 0.0 - 0.7 K/uL   Basophils Relative 0 %   Basophils Absolute 0.0 0.0 - 0.1 K/uL  Sodium, urine, random  Result Value Ref Range   Sodium, Ur 130 mmol/L  Osmolality, urine  Result Value Ref Range   Osmolality, Ur 538 300 - 900 mOsm/kg  Osmolality  Result Value Ref Range   Osmolality 270 (L) 275 - 295 mOsm/kg  Hemoglobin A1c  Result Value Ref Range   Hgb A1c MFr Bld 6.1 (H) 4.8 - 5.6 %   Mean Plasma Glucose 128 mg/dL  TSH  Result Value Ref Range   TSH 3.595 0.350 - 4.500 uIU/mL  Basic metabolic panel  Result Value Ref Range   Sodium 132 (L) 135 - 145 mmol/L   Potassium 3.8 3.5 - 5.1 mmol/L   Chloride 99 (L) 101 - 111 mmol/L   CO2 28 22 - 32 mmol/L   Glucose, Bld 123 (H) 65 - 99 mg/dL   BUN 6 6 - 20 mg/dL   Creatinine, Ser 0.68 0.44 - 1.00 mg/dL   Calcium 9.2 8.9 - 10.3 mg/dL   GFR calc non Af Amer >60 >60 mL/min   GFR calc Af Amer >60 >60 mL/min   Anion gap 5 5 - 15  Hemoglobin and hematocrit, blood  Result Value Ref Range   Hemoglobin 8.8 (L) 12.0 - 15.0 g/dL   HCT 26.0 (L) 36.0 - [id number] %  Basic metabolic panel  Result Value Ref Range   Sodium 128 (L) 135 - 145 mmol/L   Potassium 3.3 (L) 3.5 - 5.1 mmol/L   Chloride 91 (L) 101 - 111 mmol/L   CO2 27 22 - 32 mmol/L   Glucose, Bld 131 (H) 65 - 99 mg/dL   BUN 7 6 - 20 mg/dL   Creatinine, Ser 0.76 0.44 - 1.00 mg/dL   Calcium 9.4 8.9 - 10.3 mg/dL   GFR calc non Af Amer >60 >60 mL/min   GFR calc Af Amer >60 >60 mL/min   Anion gap 10 5 - 15  CBC  Result Value Ref Range   WBC 7.1 4.0 - 10.5 K/uL   RBC 3.35 (L) 3.87 - 5.11 MIL/uL   Hemoglobin 9.1 (L) 12.0 - 15.0 g/dL   HCT 27.2 (L) 36.0 - 46.0 %   MCV 81.2 78.0 - 100.0 fL   MCH 27.2 26.0 - 34.0 pg   MCHC 33.5 30.0 - 36.0 g/dL   RDW 14.1 11.5 - 15.5 %   Platelets 163 150 - 400 K/uL  I-STAT 4, (NA,K, GLUC, HGB,HCT)  Result Value Ref Range   Sodium 125 (L) 135 - 145 mmol/L   Potassium 4.3 3.5 - 5.1 mmol/L   Glucose, Bld 243 (H) 65 - 99 mg/dL   HCT 25.0 (L) 36.0 -  46.0 %   Hemoglobin 8.5 (L) 12.0 - 15.0 g/dL  POCT I-Stat EG7  Result Value Ref Range   pH, Ven 7.436 (H) 7.250 - 7.300   pCO2, Ven 34.4 (L) 45.0 - 50.0 mmHg   pO2, Ven 79.0 (H) 30.0 - 45.0 mmHg   Bicarbonate 23.1 20.0 - 24.0 mEq/L   TCO2 24 0 - 100 mmol/L   O2 Saturation 96.0 %   Acid-base deficit 1.0 0.0 - 2.0 mmol/L   Sodium 124 (L) 135 - 145 mmol/L   Potassium 4.3 3.5 - 5.1 mmol/L   Calcium,  Ion 1.17 1.12 - 1.23 mmol/L   HCT 24.0 (L) 36.0 - 46.0 %   Hemoglobin 8.2 (L) 12.0 - 15.0 g/dL   Patient temperature 37.3 C    Sample type VENOUS   I-STAT 4, (NA,K, GLUC, HGB,HCT)  Result Value Ref Range   Sodium 126 (L) 135 - 145 mmol/L   Potassium 4.4 3.5 - 5.1 mmol/L   Glucose, Bld 181 (H) 65 - 99 mg/dL   HCT 27.0 (L) 36.0 - 46.0 %   Hemoglobin 9.2 (L) 12.0 - 15.0 g/dL  Type and screen [place]  Result Value Ref Range   ABO/RH(D) O NEG    Antibody Screen NEG    Sample Expiration 03/22/2015    Unit Number [id number]    Blood Component Type RBC LR PHER2    Unit division 00    Status of Unit ALLOCATED    Donor AG Type NEGATIVE FOR C ANTIGEN NEGATIVE FOR KELL ANTIGEN    Transfusion Status OK TO TRANSFUSE    Crossmatch Result COMPATIBLE    Unit Number [id number]    Blood Component Type RED CELLS,LR    Unit division 00    Status of Unit ISSUED,FINAL    Donor AG Type NEGATIVE FOR C ANTIGEN NEGATIVE FOR KELL ANTIGEN    Transfusion Status OK TO TRANSFUSE    Crossmatch Result COMPATIBLE   Prepare RBC  Result Value Ref Range   Order Confirmation ORDER PROCESSED BY BLOOD BANK   Prepare RBC  Result Value Ref Range   Order Confirmation      ORDER PROCESSED BY BLOOD BANK BB SAMPLE OR UNITS ALREADY AVAILABLE    Discharge Medications:     Medication List    TAKE these medications        ALPRAZolam 1 MG tablet  Commonly known as:  XANAX  Take 1 mg by mouth 3 (three) times daily as needed for anxiety.     atenolol 50 MG tablet  Commonly known as:   TENORMIN  Take 50 mg by mouth 2 (two) times daily.     carbamazepine 200 MG tablet  Commonly known as:  TEGRETOL  Take 300 mg by mouth 2 (two) times daily.     ceFAZolin 2-3 GM-% Solr  Commonly known as:  ANCEF  Inject 50 mLs (2 g total) into the vein every 8 (eight) hours.     dexlansoprazole 60 MG capsule  Commonly known as:  DEXILANT  Take 60 mg by mouth daily.     escitalopram 20 MG tablet  Commonly known as:  LEXAPRO  Take 20 mg by mouth daily.     hyoscyamine 0.125 MG tablet  Commonly known as:  LEVSIN, ANASPAZ  Take 1 tablet (0.125 mg total) by mouth 4 (four) times daily as needed for cramping.     ibuprofen 200 MG tablet  Commonly known as:  ADVIL,MOTRIN  Take 200 mg by mouth every 4 (four) hours as needed for headache or mild pain.     mirtazapine 15 MG tablet  Commonly known as:  REMERON  Take 30 mg by mouth at bedtime.     ondansetron 8 MG tablet  Commonly known as:  ZOFRAN  Take 8 mg by mouth 2 (two) times daily as needed for nausea or vomiting.     oxyCODONE 5 MG immediate release tablet  Commonly known as:  Oxy IR/ROXICODONE  Take 1-2 tablets (5-10 mg total) by mouth every 3 (three) hours as needed for breakthrough pain.  rivaroxaban 10 MG Tabs tablet  Commonly known as:  XARELTO  Take 1 tablet (10 mg total) by mouth daily.     traZODone 50 MG tablet  Commonly known as:  DESYREL  Take 50 mg by mouth at bedtime.        Diagnostic Studies: Dg Pelvis Portable  03/19/2015  CLINICAL DATA:  Prosthetic hip infection, postoperative revision. EXAM: PORTABLE PELVIS 1-2 VIEWS COMPARISON:  11/25/2014 and fluoroscopic spot images from 11/20 2/16 FINDINGS: A new right hip prosthesis is in place with some cerclage wires holding the proximal femoral fragment in place, and with the stem extending into the distal femoral shaft fragment. Scattered nodular densities compatible with antibiotic beads in the region of the operative bed and implant. Along the acetabulum  there is a bony density shell spacer in place aligned with the axis of the prosthetic femoral head. Rectal staple line noted. Mild degenerative arthropathy of the left hip. IMPRESSION: 1. New right hip implant in place, suspected separate greater trochanteric fragment and shaft fragment, with cerclage wires noted in the vicinity of the trochanteric fragment. Antibiotic beads in place. Bone density shell spacer along the acetabulum. No significant malalignment. Electronically Signed   By: Van Clines M.D.   On: 03/19/2015 21:20   Dg Hip Operative Unilat With Pelvis Right  03/19/2015  CLINICAL DATA:  Right total hip arthroplasty revision EXAM: OPERATIVE RIGHT HIP (WITH PELVIS IF PERFORMED) 1 VIEWS TECHNIQUE: Fluoroscopic spot image(s) were submitted for interpretation post-operatively. COMPARISON:  11/23/2014 FINDINGS: Limited spot fluoroscopic intraoperative view during the total hip revision. Hardware has been revised. Cerclage wire about the intertrochanteric region. Alignment with the acetabulum is difficult to assess because of the projection and only one view. IMPRESSION: Limited operative imaging during right hip arthroplasty revision. Electronically Signed   By: Jerilynn Mages.  Shick M.D.   On: 03/19/2015 19:04    Disposition: 03-Skilled Nursing Facility        Follow-up Information    Follow up with Bobby Rumpf, MD In 6 weeks.   Specialty:  Infectious Diseases   Why:  Infectious disease follow up   Contact information:   [address] [place] [address] [phone]        Signed: Meredith Pel 03/22/2015, 8:51 AM

## 2015-03-22 NOTE — Progress Notes (Signed)
Medical consultation follow-up progress note       PATIENT DETAILS Name: Christina French Age: 59 y.o. 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 Follow lytes.   Active Problems: Seizure disorder: Cautiously continue with Tegretol-if sodium worsens any further-may need to change to an alternate agent-however patient is very reluctant to change seizure medications as she feels like Tegretol is in the only thing that has controlled her seizures well-therefore continue to monitor-if there is a dramatic drop in Na we can revisit this issue.  Hypertension: BP currently stable without the use of any antihypertensives-resume atenolol when able  Acute blood loss anemia related to surgical procedure: Hemoglobin stable-status post PRBC transfusion. Follow  Rest of the issues per primary service and ID  Disposition: Per primary service  Antimicrobial agents  See below  Anti-infectives    Start     Dose/Rate Route Frequency Ordered Stop   03/21/15 1415  ceFAZolin (ANCEF) IVPB 2 g/50 mL premix     2 g 100 mL/hr over 30 Minutes Intravenous 3 times per day 03/21/15 1406     03/21/15 0000  ceFAZolin (ANCEF) 2-3 GM-% SOLR     2 g 100 mL/hr over 30 Minutes Intravenous Every 8 hours 03/21/15 2251     03/20/15 0030  ceFAZolin (ANCEF) IVPB 2 g/50 mL premix     2 g 100 mL/hr over 30 Minutes Intravenous Every 6 hours 03/19/15 2138 03/20/15 1546   03/19/15 1842  vancomycin (VANCOCIN) powder  Status:  Discontinued       As needed 03/19/15 1842 03/19/15 1944   03/19/15 1842  gentamicin (GARAMYCIN) injection  Status:  Discontinued       As needed 03/19/15 1843 03/19/15 1944   03/19/15 1730  tobramycin (NEBCIN)  powder  Status:  Discontinued       As needed 03/19/15 1755 03/19/15 1944   03/19/15 1730  vancomycin (VANCOCIN) powder  Status:  Discontinued       As needed 03/19/15 1758 03/19/15 1944   03/19/15 1545  tobramycin (NEBCIN) powder 2.4 g  Status:  Discontinued     2.4 g Topical To Surgery 03/19/15 1537 03/19/15 2138   03/19/15 1315  ceFAZolin (ANCEF) IVPB 2 g/50 mL premix     2 g 100 mL/hr over 30 Minutes Intravenous To ShortStay Surgical 03/18/15 1356 03/19/15 1824      Time spent 20 minutes-Greater than 50% of this time was spent in counseling, explanation of diagnosis, planning of further management, and coordination of care.  MEDICATIONS: Scheduled Meds: . carbamazepine  300 mg Oral BID  .  ceFAZolin (ANCEF) IV  2 g Intravenous 3 times per day  . escitalopram  20 mg Oral Daily  . feeding supplement (ENSURE ENLIVE)  237 mL Oral TID BM  . mirtazapine  30 mg Oral QHS  . pantoprazole  40 mg Oral Daily  . rivaroxaban  10 mg Oral Daily  . traZODone  50 mg Oral QHS   Continuous Infusions:   PRN Meds:.acetaminophen **OR** acetaminophen, ALPRAZolam, diphenhydrAMINE, methocarbamol **OR** methocarbamol (ROBAXIN)  IV, metoCLOPramide **OR** metoCLOPramide (REGLAN) injection, morphine injection, ondansetron **OR** ondansetron (ZOFRAN) IV, oxyCODONE    PHYSICAL EXAM: Vital signs in last 24 hours: Filed Vitals:   03/21/15  [id number] 03/21/15 1500 03/21/15 2027 03/22/15 0610  BP: 118/64 127/58 128/51 119/42  Pulse: 72 101 71 75  Temp: 98.4 F (36.9 C) 99.6 F (37.6 C) 98.2 F (36.8 C) 98.5 F (36.9 C)  TempSrc: Oral Oral Oral   Resp: 18 18 17 16   Height:      Weight:      SpO2: 98% 98% 98% 99%    Weight change:  Filed Weights   03/19/15 1201 03/19/15 2140  Weight: 82.419 kg (181 lb 11.2 oz) 84.959 kg (187 lb 4.8 oz)   Body mass index is 35.41 kg/(m^2).   Gen Exam: Awake and alert with clear speech.   Neck: Supple, No JVD.   Chest: B/L Clear.   CVS: S1 S2 Regular, no murmurs.    Abdomen: soft, BS +, non tender, non distended.  Extremities: no edema, lower extremities warm to touch. Neurologic: Non Focal.   Skin: No Rash.   Wounds: N/A.   Intake/Output from previous day:  Intake/Output Summary (Last 24 hours) at 03/22/15 1439 Last data filed at 03/21/15 1500  Gross per 24 hour  Intake      0 ml  Output    300 ml  Net   -300 ml     LAB RESULTS: CBC  Recent Labs Lab 03/19/15 1735 03/19/15 1744 03/20/15 0545 03/21/15 0346 03/22/15 0402  WBC  --   --  10.3  --  7.1  HGB 8.5* 8.2* 7.0* 8.8* 9.1*  HCT 25.0* 24.0* 20.5* 26.0* 27.2*  PLT  --   --  170  --  163  MCV  --   --  78.5  --  81.2  MCH  --   --  26.8  --  27.2  MCHC  --   --  34.1  --  33.5  RDW  --   --  13.5  --  14.1  LYMPHSABS  --   --  0.7  --   --   MONOABS  --   --  0.8  --   --   EOSABS  --   --  0.0  --   --   BASOSABS  --   --  0.0  --   --     Chemistries   Recent Labs Lab 03/19/15 1240 03/19/15 1641 03/19/15 1735 03/19/15 1744 03/20/15 0545 03/21/15 0327 03/22/15 0402  NA 128* 126* 125* 124* 130* 132* 128*  K 4.2 4.4 4.3 4.3 4.7 3.8 3.3*  CL 92*  --   --   --  98* 99* 91*  CO2 28  --   --   --  24 28 27   GLUCOSE 131* 181* 243*  --  175* 123* 131*  BUN 11  --   --   --  9 6 7   CREATININE 0.83  --   --   --  0.88 0.68 0.76  CALCIUM 9.3  --   --   --  8.5* 9.2 9.4    CBG: No results for input(s): GLUCAP in the last 168 hours.  GFR Estimated Creatinine Clearance: 76.8 mL/min (by C-G formula based on Cr of 0.76).  Coagulation profile No results for input(s): INR, PROTIME in the last 168 hours.  Cardiac Enzymes No results for input(s): CKMB, TROPONINI, MYOGLOBIN in the last 168 hours.  Invalid input(s): CK  Invalid input(s): POCBNP No results for input(s): DDIMER in the last 72 hours.  Recent Labs  03/20/15 1044  HGBA1C 6.1*   No  results for input(s): CHOL, HDL, LDLCALC, TRIG, CHOLHDL, LDLDIRECT in the last 72 hours.  Recent Labs  03/20/15 1044   TSH 3.595   No results for input(s): VITAMINB12, FOLATE, FERRITIN, TIBC, IRON, RETICCTPCT in the last 72 hours. No results for input(s): LIPASE, AMYLASE in the last 72 hours.  Urine Studies No results for input(s): UHGB, CRYS in the last 72 hours.  Invalid input(s): UACOL, UAPR, USPG, UPH, UTP, UGL, UKET, UBIL, UNIT, UROB, ULEU, UEPI, UWBC, URBC, UBAC, CAST, UCOM, BILUA  MICROBIOLOGY: Recent Results (from the past 240 hour(s))  Body fluid culture     Status: None   Collection Time: 03/19/15  2:02 PM  Result Value Ref Range Status   Specimen Description FLUID SYNOVIAL RIGHT HIP  Final   Special Requests FLUID ON SWAB  Final   Gram Stain   Final    ABUNDANT WBC PRESENT,BOTH PMN AND MONONUCLEAR NO ORGANISMS SEEN    Culture   Final    FEW STAPHYLOCOCCUS AUREUS CRITICAL RESULT CALLED TO, READ BACK BY AND VERIFIED WITH: B SCOTT 03/20/15 @ M VESTAL    Report Status 03/21/2015 FINAL  Final   Organism ID, Bacteria STAPHYLOCOCCUS AUREUS  Final      Susceptibility   Staphylococcus aureus - MIC*    CIPROFLOXACIN <=0.5 SENSITIVE Sensitive     ERYTHROMYCIN <=0.25 SENSITIVE Sensitive     GENTAMICIN <=0.5 SENSITIVE Sensitive     OXACILLIN 0.5 SENSITIVE Sensitive     TETRACYCLINE <=1 SENSITIVE Sensitive     VANCOMYCIN <=0.5 SENSITIVE Sensitive     TRIMETH/SULFA <=10 SENSITIVE Sensitive     CLINDAMYCIN <=0.25 SENSITIVE Sensitive     RIFAMPIN <=0.5 SENSITIVE Sensitive     Inducible Clindamycin NEGATIVE Sensitive     * FEW STAPHYLOCOCCUS AUREUS  Gram stain     Status: None   Collection Time: 03/19/15  2:02 PM  Result Value Ref Range Status   Specimen Description FLUID SYNOVIAL RIGHT HIP  Final   Special Requests FLUID ON SWAB  Final   Gram Stain   Final    ABUNDANT WBC PRESENT,BOTH PMN AND MONONUCLEAR NO ORGANISMS SEEN    Report Status 03/19/2015 FINAL  Final  Anaerobic culture     Status: None (Preliminary result)   Collection Time: 03/19/15  2:02 PM  Result Value Ref Range Status    Specimen Description FLUID SYNOVIAL RIGHT HIP  Final   Special Requests FLUID ON SWAB  Final   Gram Stain   Final    ABUNDANT WBC PRESENT,BOTH PMN AND MONONUCLEAR NO ORGANISMS SEEN    Culture   Final    NO ANAEROBES ISOLATED; CULTURE IN PROGRESS FOR 5 DAYS   Report Status PENDING  Incomplete  Tissue culture     Status: None (Preliminary result)   Collection Time: 03/19/15  3:51 PM  Result Value Ref Range Status   Specimen Description TISSUE RIGHT HIP  Final   Special Requests NONE  Final   Gram Stain   Final    NO WBC SEEN NO ORGANISMS SEEN Performed at [facility]    Culture   Final    NO GROWTH 2 DAYS Performed at [facility]    Report Status PENDING  Incomplete    RADIOLOGY STUDIES/RESULTS: Dg Pelvis Portable  03/19/2015  CLINICAL DATA:  Prosthetic hip infection, postoperative revision. EXAM: PORTABLE PELVIS 1-2 VIEWS COMPARISON:  11/25/2014 and fluoroscopic spot images from 11/20 2/16 FINDINGS: A new right hip prosthesis is in place with some cerclage wires  holding the proximal femoral fragment in place, and with the stem extending into the distal femoral shaft fragment. Scattered nodular densities compatible with antibiotic beads in the region of the operative bed and implant. Along the acetabulum there is a bony density shell spacer in place aligned with the axis of the prosthetic femoral head. Rectal staple line noted. Mild degenerative arthropathy of the left hip. IMPRESSION: 1. New right hip implant in place, suspected separate greater trochanteric fragment and shaft fragment, with cerclage wires noted in the vicinity of the trochanteric fragment. Antibiotic beads in place. Bone density shell spacer along the acetabulum. No significant malalignment. Electronically Signed   By: Van Clines M.D.   On: 03/19/2015 21:20   Dg Hip Operative Unilat With Pelvis Right  03/19/2015  CLINICAL DATA:  Right total hip arthroplasty revision EXAM: OPERATIVE RIGHT  HIP (WITH PELVIS IF PERFORMED) 1 VIEWS TECHNIQUE: Fluoroscopic spot image(s) were submitted for interpretation post-operatively. COMPARISON:  11/23/2014 FINDINGS: Limited spot fluoroscopic intraoperative view during the total hip revision. Hardware has been revised. Cerclage wire about the intertrochanteric region. Alignment with the acetabulum is difficult to assess because of the projection and only one view. IMPRESSION: Limited operative imaging during right hip arthroplasty revision. Electronically Signed   By: Jerilynn Mages.  Shick M.D.   On: 03/19/2015 19:04    Oren Binet, MD  [facility] Pager:[phone]  If 7PM-7AM, please contact night-coverage [email or web address] Password [email or web address] 03/22/2015, 2:39 PM   LOS: 3 days

## 2015-03-22 NOTE — Progress Notes (Signed)
Peripherally Inserted Central Catheter/Midline Placement  The IV Nurse has discussed with the patient and/or persons authorized to consent for the patient, the purpose of this procedure and the potential benefits and risks involved with this procedure.  The benefits include less needle sticks, lab draws from the catheter and patient may be discharged home with the catheter.  Risks include, but not limited to, infection, bleeding, blood clot (thrombus formation), and puncture of an artery; nerve damage and irregular heat beat.  Alternatives to this procedure were also discussed.  PICC/Midline Placement Documentation  PICC / Midline Single Lumen [id number] PICC Right Basilic 39 cm 0 cm (Active)       Holley Bouche Renee 03/22/2015, 5:14 PM

## 2015-03-22 NOTE — Progress Notes (Signed)
Physical Therapy Treatment Patient Details Name: Christina French MRN: [id number] DOB: 05-Mar-1956 Today's Date: 03/22/2015    History of Present Illness 59 y.o. 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 PMH: hypertension, anemia, anxiety, depression, seizures, vertigo.    PT Comments    Patient making gradual progress with mobility during PT sessions. Patient able to stand at edge of bed for approximately 5 minutes with rw, TDWB only. Assistance required with mobility at this time but decreasing in amount. Patient declining pivot turn to chair at this time. Will continue to follow to progress mobility. Recommending SNF at D/C.   Follow Up Recommendations  SNF;Supervision for mobility/OOB     Equipment Recommendations  None recommended by PT    Recommendations for Other Services       Precautions / Restrictions Restrictions Weight Bearing Restrictions: Yes RLE Weight Bearing: Touchdown weight bearing    Mobility  Bed Mobility Overal bed mobility: Needs Assistance Bed Mobility: Supine to Sit;Sit to Supine     Supine to sit: HOB elevated;Min guard (using rail, HOB approx 30 degrees) Sit to supine: Mod assist (assist with bilateral LEs)      Transfers Overall transfer level: Needs assistance Equipment used: Rolling walker (2 wheeled) Transfers: Sit to/from Stand Sit to Stand: Min assist         General transfer comment: reminder for TDWB only on Rt. LE. Declining pivot transfers to chair. Patient standing edge of bed approx. 5 minutes.   Ambulation/Gait                 Stairs            Wheelchair Mobility    Modified Rankin (Stroke Patients Only)       Balance Overall balance assessment: Needs assistance Sitting-balance support: No upper extremity supported Sitting balance-Leahy Scale: Good     Standing balance support: Bilateral upper extremity supported Standing balance-Leahy Scale:  Poor Standing balance comment: using rw for support                    Cognition Arousal/Alertness: Awake/alert Behavior During Therapy: WFL for tasks assessed/performed Overall Cognitive Status: Within Functional Limits for tasks assessed                      Exercises      General Comments        Pertinent Vitals/Pain Pain Assessment: 0-10 Pain Score: 2  Pain Location: Rt hip Pain Descriptors / Indicators: Sore Pain Intervention(s): Limited activity within patient's tolerance;Monitored during session    Home Living                      Prior Function            PT Goals (current goals can now be found in the care plan section) Acute Rehab PT Goals Patient Stated Goal: Go to rehab after the hospital PT Goal Formulation: With patient Time For Goal Achievement: 04/03/15 Potential to Achieve Goals: Good Progress towards PT goals: Progressing toward goals    Frequency  Min 5X/week    PT Plan Current plan remains appropriate    Co-evaluation             End of Session Equipment Utilized During Treatment: Gait belt Activity Tolerance: Patient limited by fatigue Patient left: in bed;with call bell/phone within reach;with SCD's reapplied     Time: 1251-1306 PT  Time Calculation (min) (ACUTE ONLY): 15 min  Charges:  $Therapeutic Activity: 8-22 mins                    G Codes:      Cassell Clement, PT, CSCS Pager [phone] Office [phone]  03/22/2015, 2:05 PM

## 2015-03-22 NOTE — Progress Notes (Signed)
Subjective: Pt stable - vss   Objective: Vital signs in last 24 hours: Temp:  [98.2 F (36.8 C)-99.6 F (37.6 C)] 98.5 F (36.9 C) (11/25 0610) Pulse Rate:  [71-101] 75 (11/25 0610) Resp:  [16-18] 16 (11/25 0610) BP: (119-128)/(42-58) 119/42 mmHg (11/25 0610) SpO2:  [98 %-99 %] 99 % (11/25 0610)  Intake/Output from previous day: 11/24 0701 - 11/25 0700 In: 240 [P.O.:240] Out: 650 [Urine:650] Intake/Output this shift:    Exam:  Sensation intact distally Intact pulses distally Dorsiflexion/Plantar flexion intact  Labs:  Recent Labs  03/19/15 1735 03/19/15 1744 03/20/15 0545 03/21/15 0346 03/22/15 0402  HGB 8.5* 8.2* 7.0* 8.8* 9.1*    Recent Labs  03/20/15 0545 03/21/15 0346 03/22/15 0402  WBC 10.3  --  7.1  RBC 2.61*  --  3.35*  HCT 20.5* 26.0* 27.2*  PLT 170  --  163    Recent Labs  03/21/15 0327 03/22/15 0402  NA 132* 128*  K 3.8 3.3*  CL 99* 91*  CO2 28 27  BUN 6 7  CREATININE 0.68 0.76  GLUCOSE 123* 131*  CALCIUM 9.2 9.4   No results for input(s): LABPT, INR in the last 72 hours.  Assessment/Plan: Plan for tx to snf when ready Dc summary done   [facility] SCOTT 03/22/2015, 8:46 AM

## 2015-03-22 NOTE — Progress Notes (Signed)
   Subjective:  Patient reports pain as mild.    Objective:   VITALS:   Filed Vitals:   03/21/15 0445 03/21/15 1500 03/21/15 2027 03/22/15 0610  BP: 118/64 127/58 128/51 119/42  Pulse: 72 101 71 75  Temp: 98.4 F (36.9 C) 99.6 F (37.6 C) 98.2 F (36.8 C) 98.5 F (36.9 C)  TempSrc: Oral Oral Oral   Resp: 18 18 17 16   Height:      Weight:      SpO2: 98% 98% 98% 99%    Neurologically intact Neurovascular intact Sensation intact distally Intact pulses distally Dorsiflexion/Plantar flexion intact Incision: dressing C/D/I and no drainage No cellulitis present Compartment soft   Lab Results  Component Value Date   WBC 7.1 03/22/2015   HGB 9.1* 03/22/2015   HCT 27.2* 03/22/2015   MCV 81.2 03/22/2015   PLT 163 03/22/2015     Assessment/Plan:  3 Days Post-Op   - Expected postop acute blood loss anemia - will monitor for symptoms - Up with PT/OT - SNF pending - DVT ppx - SCDs, ambulation, xarelto - NWB operative extremity, TDWB for transfers only - Pain controlled - hyponatremic and hypokalemic - appreciate hospitalist input - possible SNF today - Rx in chart  Marianna Payment 03/22/2015, 8:08 AM [phone]

## 2015-03-22 NOTE — Clinical Social Work Note (Signed)
Clinical Social Work Assessment  Patient Details  Name: Christina French MRN: [id number] Date of Birth: [birth year]  Date of referral:  03/22/15               Reason for consult:  Facility Placement, Discharge Planning                Permission sought to share information with:  Chartered certified accountant granted to share information::  Yes, Verbal Permission Granted  Name::        Agency::  [facility] (preference for [facility] or [place] area)  Relationship::     Contact Information:     Housing/Transportation Living arrangements for the past 2 months:  Single Family Home Source of Information:  Patient Patient Interpreter Needed:  None Criminal Activity/Legal Involvement Pertinent to Current Situation/Hospitalization:  No - Comment as needed Significant Relationships:  Adult Children (Daughter: Christina French) Lives with:  Self Do you feel safe going back to the place where you live?  No (High fall risk.) Need for family participation in patient care:  No (Coment) (Patient able to make own decisions.)  Care giving concerns:  Patient expressed no concerns at this time.   Social Worker assessment / plan:  CSW received referral for possible SNF placement at time of discharge. CSW met with patient at bedside to discuss discharge planning needs. Patient expressed understanding of PT recommendation for SNF placement at time of discharge. Patient stated preference for [facility] in [place]. CSW to continue to follow and assist with discharge planning needs.  Employment status:  Other (Comment) (Did not disclose.) Insurance information:  Programmer, applications Retail buyer) PT Recommendations:  [place] / Referral to community resources:  [place]  Patient/Family's Response to care:  Patient understanding and agreeable to CSW plan of care.  Patient/Family's Understanding of and Emotional Response to Diagnosis,  Current Treatment, and Prognosis:  Patient understanding and agreeable to CSW plan of care.  Emotional Assessment Appearance:  Appears stated age Attitude/Demeanor/Rapport:  Other (Pleasant.) Affect (typically observed):  Accepting, Appropriate, Pleasant, Quiet Orientation:  Oriented to Self, Oriented to Place, Oriented to  Time, Oriented to Situation Alcohol / Substance use:  Not Applicable Psych involvement (Current and /or in the community):  No (Comment) (Not appropriate on this admission.)  Discharge Needs  Concerns to be addressed:  No discharge needs identified Readmission within the last 30 days:  No Current discharge risk:  None Barriers to Discharge:  No Barriers Identified   [name], LCSW 03/22/2015, 11:51 AM [phone]

## 2015-03-22 NOTE — Clinical Social Work Placement (Signed)
   CLINICAL SOCIAL WORK PLACEMENT  NOTE  Date:  03/22/2015  Patient Details  Name: Christina French MRN: [id number] Date of Birth: 19-Sep-1955  Clinical Social Work is seeking post-discharge placement for this patient at the [place] level of care (*CSW will initial, date and re-position this form in  chart as items are completed):  Yes   Patient/family provided with [place] Work Department's list of facilities offering this level of care within the geographic area requested by the patient (or if unable, by the patient's family).  Yes   Patient/family informed of their freedom to choose among providers that offer the needed level of care, that participate in Medicare, Medicaid or managed care program needed by the patient, have an available bed and are willing to accept the patient.  Yes   Patient/family informed of [place]'s ownership interest in [facility] and [facility], as well as of the fact that they are under no obligation to receive care at these facilities.  PASRR submitted to EDS on       PASRR number received on       Existing PASRR number confirmed on 03/22/15     FL2 transmitted to all facilities in geographic area requested by pt/family on 03/22/15     FL2 transmitted to all facilities within larger geographic area on       Patient informed that his/her managed care company has contracts with or will negotiate with certain facilities, including the following:        Yes   Patient/family informed of bed offers received.  Patient chooses bed at [facility]     Physician recommends and patient chooses bed at      Patient to be transferred to [facility] on 03/22/15.  Patient to be transferred to facility by PTAR     Patient family notified on 03/22/15 of transfer.  Name of family member notified:  Patient at bedside.     PHYSICIAN       Additional Comment:     _______________________________________________ [name], LCSW 03/22/2015, 11:56 AM

## 2015-03-22 NOTE — Progress Notes (Signed)
Utilization review completed.  

## 2015-03-23 DIAGNOSIS — E871 Hypo-osmolality and hyponatremia: Secondary | ICD-10-CM | POA: Diagnosis not present

## 2015-03-23 DIAGNOSIS — T8459XS Infection and inflammatory reaction due to other internal joint prosthesis, sequela: Secondary | ICD-10-CM | POA: Diagnosis not present

## 2015-03-23 DIAGNOSIS — K589 Irritable bowel syndrome without diarrhea: Secondary | ICD-10-CM | POA: Diagnosis not present

## 2015-03-23 DIAGNOSIS — M6281 Muscle weakness (generalized): Secondary | ICD-10-CM | POA: Diagnosis not present

## 2015-03-23 DIAGNOSIS — A419 Sepsis, unspecified organism: Secondary | ICD-10-CM | POA: Diagnosis not present

## 2015-03-23 DIAGNOSIS — R0989 Other specified symptoms and signs involving the circulatory and respiratory systems: Secondary | ICD-10-CM | POA: Diagnosis not present

## 2015-03-23 DIAGNOSIS — G40909 Epilepsy, unspecified, not intractable, without status epilepticus: Secondary | ICD-10-CM | POA: Diagnosis not present

## 2015-03-23 DIAGNOSIS — A4901 Methicillin susceptible Staphylococcus aureus infection, unspecified site: Secondary | ICD-10-CM | POA: Diagnosis not present

## 2015-03-23 DIAGNOSIS — F445 Conversion disorder with seizures or convulsions: Secondary | ICD-10-CM | POA: Diagnosis not present

## 2015-03-23 DIAGNOSIS — M00051 Staphylococcal arthritis, right hip: Secondary | ICD-10-CM | POA: Diagnosis not present

## 2015-03-23 DIAGNOSIS — R630 Anorexia: Secondary | ICD-10-CM | POA: Diagnosis not present

## 2015-03-23 DIAGNOSIS — T8451XA Infection and inflammatory reaction due to internal right hip prosthesis, initial encounter: Secondary | ICD-10-CM | POA: Diagnosis not present

## 2015-03-23 DIAGNOSIS — F411 Generalized anxiety disorder: Secondary | ICD-10-CM | POA: Diagnosis not present

## 2015-03-23 DIAGNOSIS — T814XXA Infection following a procedure, initial encounter: Secondary | ICD-10-CM | POA: Diagnosis not present

## 2015-03-23 DIAGNOSIS — I10 Essential (primary) hypertension: Secondary | ICD-10-CM | POA: Diagnosis not present

## 2015-03-23 DIAGNOSIS — F329 Major depressive disorder, single episode, unspecified: Secondary | ICD-10-CM | POA: Diagnosis not present

## 2015-03-23 DIAGNOSIS — G4709 Other insomnia: Secondary | ICD-10-CM | POA: Diagnosis not present

## 2015-03-23 DIAGNOSIS — M25551 Pain in right hip: Secondary | ICD-10-CM | POA: Diagnosis not present

## 2015-03-23 DIAGNOSIS — R2689 Other abnormalities of gait and mobility: Secondary | ICD-10-CM | POA: Diagnosis not present

## 2015-03-23 DIAGNOSIS — F320 Major depressive disorder, single episode, mild: Secondary | ICD-10-CM | POA: Diagnosis not present

## 2015-03-23 DIAGNOSIS — D6489 Other specified anemias: Secondary | ICD-10-CM | POA: Diagnosis not present

## 2015-03-23 DIAGNOSIS — E119 Type 2 diabetes mellitus without complications: Secondary | ICD-10-CM | POA: Diagnosis not present

## 2015-03-23 DIAGNOSIS — A047 Enterocolitis due to Clostridium difficile: Secondary | ICD-10-CM | POA: Diagnosis not present

## 2015-03-23 DIAGNOSIS — M62838 Other muscle spasm: Secondary | ICD-10-CM | POA: Diagnosis not present

## 2015-03-23 DIAGNOSIS — T8451XD Infection and inflammatory reaction due to internal right hip prosthesis, subsequent encounter: Secondary | ICD-10-CM | POA: Diagnosis not present

## 2015-03-23 DIAGNOSIS — M009 Pyogenic arthritis, unspecified: Secondary | ICD-10-CM | POA: Diagnosis not present

## 2015-03-23 DIAGNOSIS — B999 Unspecified infectious disease: Secondary | ICD-10-CM | POA: Diagnosis not present

## 2015-03-23 DIAGNOSIS — D649 Anemia, unspecified: Secondary | ICD-10-CM | POA: Diagnosis not present

## 2015-03-23 DIAGNOSIS — G4700 Insomnia, unspecified: Secondary | ICD-10-CM | POA: Diagnosis not present

## 2015-03-23 DIAGNOSIS — G478 Other sleep disorders: Secondary | ICD-10-CM | POA: Diagnosis not present

## 2015-03-23 DIAGNOSIS — A048 Other specified bacterial intestinal infections: Secondary | ICD-10-CM | POA: Diagnosis not present

## 2015-03-23 LAB — TISSUE CULTURE
Culture: NO GROWTH
Gram Stain: NONE SEEN

## 2015-03-23 LAB — TYPE AND SCREEN
ABO/RH(D): O NEG
Antibody Screen: NEGATIVE
DONOR AG TYPE: NEGATIVE
Donor AG Type: NEGATIVE
UNIT DIVISION: 0
UNIT DIVISION: 0

## 2015-03-23 LAB — PROTIME-INR
INR: 1.24 (ref 0.00–1.49)
PROTHROMBIN TIME: 15.8 s — AB (ref 11.6–15.2)

## 2015-03-23 MED ORDER — WARFARIN SODIUM 5 MG PO TABS: 5.0000 mg | ORAL_TABLET | Freq: Once | ORAL | Status: DC

## 2015-03-23 MED ORDER — HEPARIN SOD (PORK) LOCK FLUSH 100 UNIT/ML IV SOLN
250.0000 [IU] | INTRAVENOUS | Status: AC | PRN
  Administered 2015-03-23: 250 [IU]

## 2015-03-23 MED ORDER — WARFARIN SODIUM 5 MG PO TABS
5.0000 mg | ORAL_TABLET | Freq: Once | ORAL | Status: AC
  Administered 2015-03-23: 5 mg via ORAL
  Filled 2015-03-23: qty 1

## 2015-03-23 NOTE — Progress Notes (Signed)
Patient discharged to [facility] via [place]. Prescriptions given to PTAR. Report called to [employer].

## 2015-03-23 NOTE — Clinical Social Work Placement (Signed)
   CLINICAL SOCIAL WORK PLACEMENT  NOTE  Date:  03/23/2015  Patient Details  Name: Christina French MRN: [id number] Date of Birth: 30-Oct-1955  Clinical Social Work is seeking post-discharge placement for this patient at the [place] level of care (*CSW will initial, date and re-position this form in  chart as items are completed):  Yes   Patient/family provided with [place] Work Department's list of facilities offering this level of care within the geographic area requested by the patient (or if unable, by the patient's family).  Yes   Patient/family informed of their freedom to choose among providers that offer the needed level of care, that participate in Medicare, Medicaid or managed care program needed by the patient, have an available bed and are willing to accept the patient.  Yes   Patient/family informed of [place]'s ownership interest in [facility] and [facility], as well as of the fact that they are under no obligation to receive care at these facilities.  PASRR submitted to EDS on       PASRR number received on       Existing PASRR number confirmed on 03/22/15     FL2 transmitted to all facilities in geographic area requested by pt/family on 03/22/15     FL2 transmitted to all facilities within larger geographic area on       Patient informed that his/her managed care company has contracts with or will negotiate with certain facilities, including the following:        Yes   Patient/family informed of bed offers received.  Patient chooses bed at [facility]     Physician recommends and patient chooses bed at      Patient to be transferred to [facility] on 03/23/15.  Patient to be transferred to facility by PTAR     Patient family notified on 03/23/15 of transfer.  Name of family member notified:  Pt was notified.  Pt to notify family     PHYSICIAN       Additional Comment:     _______________________________________________ Matilde Bash, LCSW 03/23/2015, 2:46 PM

## 2015-03-23 NOTE — Progress Notes (Signed)
Facility ready to receive Pt.  Pt made aware and stated that she will notify her family.  RN to give report.  SW arranged for transportation.  Pt to be dc'd.  Bernita Raisin, [place] Social Work [phone]

## 2015-03-23 NOTE — Progress Notes (Signed)
Subjective: 4 Days Post-Op Procedure(s) (LRB): RIGHT TOTAL HIP ARTHROPLASTY REVISION (Right) Patient reports pain as moderate.    Objective: Vital signs in last 24 hours: Temp:  [98.9 F (37.2 C)-99.1 F (37.3 C)] 98.9 F (37.2 C) (11/26 0454) Pulse Rate:  [68-100] 93 (11/26 0454) Resp:  [18] 18 (11/26 0454) BP: (100-130)/(48-69) 100/48 mmHg (11/26 0454) SpO2:  [94 %-99 %] 94 % (11/26 0454)  Intake/Output from previous day: 11/25 0701 - 11/26 0700 In: 750 [P.O.:500; IV Piggyback:250] Out: -  Intake/Output this shift:     Recent Labs  03/21/15 0346 03/22/15 0402  HGB 8.8* 9.1*    Recent Labs  03/21/15 0346 03/22/15 0402  WBC  --  7.1  RBC  --  3.35*  HCT 26.0* 27.2*  PLT  --  163    Recent Labs  03/21/15 0327 03/22/15 0402  NA 132* 128*  K 3.8 3.3*  CL 99* 91*  CO2 28 27  BUN 6 7  CREATININE 0.68 0.76  GLUCOSE 123* 131*  CALCIUM 9.2 9.4    Recent Labs  03/22/15 1827 03/23/15 0400  INR 1.47 1.24    Neurologically intact  Assessment/Plan: 4 Days Post-Op Procedure(s) (LRB): RIGHT TOTAL HIP ARTHROPLASTY REVISION (Right) Discharge to SNF  Danai Gotto C 03/23/2015, 7:28 AM

## 2015-03-23 NOTE — Progress Notes (Signed)
Medical consultation follow-up progress note       PATIENT DETAILS Name: Christina French Age: 59 y.o. Sex: female Date of Birth: 02-May-1955 Admit Date: 03/19/2015 Admitting Physician Meredith Pel, MD [id number], Timoteo Gaul, MD  Subjective: No major complaints. 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 Follow lytes closely.   Active Problems: Seizure disorder: Cautiously continue with Tegretol-if sodium worsens any further-may need to change to an alternate agent-however patient is very reluctant to change seizure medications as she feels like Tegretol is in the only thing that has controlled her seizures well-therefore continue to monitor-if there is a dramatic drop in Na we can revisit this issue.  Hypertension: BP currently stable without the use of any antihypertensives-resume atenolol when able  Acute blood loss anemia related to surgical procedure: Hemoglobin stable-status post PRBC transfusion. Follow  Rest of the issues per primary service and ID  Disposition: Per primary service  Antimicrobial agents  See below  Anti-infectives    Start     Dose/Rate Route Frequency Ordered Stop   03/21/15 1415  ceFAZolin (ANCEF) IVPB 2 g/50 mL premix     2 g 100 mL/hr over 30 Minutes Intravenous 3 times per day 03/21/15 1406     03/21/15 0000  ceFAZolin (ANCEF) 2-3 GM-% SOLR     2 g 100 mL/hr over 30 Minutes Intravenous Every 8 hours 03/21/15 2251     03/20/15 0030  ceFAZolin (ANCEF) IVPB 2 g/50 mL premix     2 g 100 mL/hr over 30 Minutes Intravenous Every 6 hours 03/19/15 2138 03/20/15 1546   03/19/15 1842  vancomycin (VANCOCIN) powder  Status:  Discontinued       As needed 03/19/15 1842 03/19/15 1944   03/19/15 1842  gentamicin (GARAMYCIN) injection  Status:  Discontinued       As needed 03/19/15 1843 03/19/15 1944   03/19/15 1730   tobramycin (NEBCIN) powder  Status:  Discontinued       As needed 03/19/15 1755 03/19/15 1944   03/19/15 1730  vancomycin (VANCOCIN) powder  Status:  Discontinued       As needed 03/19/15 1758 03/19/15 1944   03/19/15 1545  tobramycin (NEBCIN) powder 2.4 g  Status:  Discontinued     2.4 g Topical To Surgery 03/19/15 1537 03/19/15 2138   03/19/15 1315  ceFAZolin (ANCEF) IVPB 2 g/50 mL premix     2 g 100 mL/hr over 30 Minutes Intravenous To [facility] 03/18/15 1356 03/19/15 1824      Time spent 20 minutes-Greater than 50% of this time was spent in counseling, explanation of diagnosis, planning of further management, and coordination of care.  MEDICATIONS: Scheduled Meds: . carbamazepine  300 mg Oral BID  .  ceFAZolin (ANCEF) IV  2 g Intravenous 3 times per day  . escitalopram  20 mg Oral Daily  . feeding supplement (ENSURE ENLIVE)  237 mL Oral TID BM  . furosemide  20 mg Oral Daily  . mirtazapine  30 mg Oral QHS  . pantoprazole  40 mg Oral Daily  . traZODone  50 mg Oral QHS  . warfarin   Does not apply Once  . Warfarin - Pharmacist Dosing Inpatient   Does not apply q1800   Continuous Infusions:   PRN Meds:.acetaminophen **OR** acetaminophen, ALPRAZolam, diphenhydrAMINE, methocarbamol **OR** methocarbamol (ROBAXIN)  IV, metoCLOPramide **OR** metoCLOPramide (REGLAN) injection, morphine  injection, ondansetron **OR** ondansetron (ZOFRAN) IV, oxyCODONE, sodium chloride    PHYSICAL EXAM: Vital signs in last 24 hours: Filed Vitals:   03/22/15 0610 03/22/15 1630 03/22/15 2100 03/23/15 0454  BP: 119/42 123/61 130/69 100/48  Pulse: 75 68 100 93  Temp: 98.5 F (36.9 C) 99.1 F (37.3 C) 98.9 F (37.2 C) 98.9 F (37.2 C)  TempSrc:  Oral Oral Oral  Resp: 16 18 18 18   Height:      Weight:      SpO2: 99% 99% 94% 94%    Weight change:  Filed Weights   03/19/15 1201 03/19/15 2140  Weight: 82.419 kg (181 lb 11.2 oz) 84.959 kg (187 lb 4.8 oz)   Body mass index is 35.41  kg/(m^2).   Gen Exam: Awake and alert with clear speech.   Neck: Supple, No JVD.   Chest: B/L Clear.   CVS: S1 S2 Regular, no murmurs.  Abdomen: soft, BS +, non tender, non distended.  Extremities: no edema, lower extremities warm to touch. Neurologic: Non Focal.   Skin: No Rash.   Wounds: N/A.   Intake/Output from previous day:  Intake/Output Summary (Last 24 hours) at 03/23/15 1426 Last data filed at 03/23/15 0525  Gross per 24 hour  Intake    510 ml  Output      0 ml  Net    510 ml     LAB RESULTS: CBC  Recent Labs Lab 03/19/15 1735 03/19/15 1744 03/20/15 0545 03/21/15 0346 03/22/15 0402  WBC  --   --  10.3  --  7.1  HGB 8.5* 8.2* 7.0* 8.8* 9.1*  HCT 25.0* 24.0* 20.5* 26.0* 27.2*  PLT  --   --  170  --  163  MCV  --   --  78.5  --  81.2  MCH  --   --  26.8  --  27.2  MCHC  --   --  34.1  --  33.5  RDW  --   --  13.5  --  14.1  LYMPHSABS  --   --  0.7  --   --   MONOABS  --   --  0.8  --   --   EOSABS  --   --  0.0  --   --   BASOSABS  --   --  0.0  --   --     Chemistries   Recent Labs Lab 03/19/15 1240 03/19/15 1641 03/19/15 1735 03/19/15 1744 03/20/15 0545 03/21/15 0327 03/22/15 0402  NA 128* 126* 125* 124* 130* 132* 128*  K 4.2 4.4 4.3 4.3 4.7 3.8 3.3*  CL 92*  --   --   --  98* 99* 91*  CO2 28  --   --   --  24 28 27   GLUCOSE 131* 181* 243*  --  175* 123* 131*  BUN 11  --   --   --  9 6 7   CREATININE 0.83  --   --   --  0.88 0.68 0.76  CALCIUM 9.3  --   --   --  8.5* 9.2 9.4    CBG: No results for input(s): GLUCAP in the last 168 hours.  GFR Estimated Creatinine Clearance: 76.8 mL/min (by C-G formula based on Cr of 0.76).  Coagulation profile  Recent Labs Lab 03/22/15 1827 03/23/15 0400  INR 1.47 1.24    Cardiac Enzymes No results for input(s): CKMB, TROPONINI, MYOGLOBIN in the last 168 hours.  Invalid  input(s): CK  Invalid input(s): POCBNP No results for input(s): DDIMER in the last 72 hours. No results for input(s):  HGBA1C in the last 72 hours. No results for input(s): CHOL, HDL, LDLCALC, TRIG, CHOLHDL, LDLDIRECT in the last 72 hours. No results for input(s): TSH, T4TOTAL, T3FREE, THYROIDAB in the last 72 hours.  Invalid input(s): FREET3 No results for input(s): VITAMINB12, FOLATE, FERRITIN, TIBC, IRON, RETICCTPCT in the last 72 hours. No results for input(s): LIPASE, AMYLASE in the last 72 hours.  Urine Studies No results for input(s): UHGB, CRYS in the last 72 hours.  Invalid input(s): UACOL, UAPR, USPG, UPH, UTP, UGL, UKET, UBIL, UNIT, UROB, ULEU, UEPI, UWBC, URBC, UBAC, CAST, UCOM, BILUA  MICROBIOLOGY: Recent Results (from the past 240 hour(s))  Body fluid culture     Status: None   Collection Time: 03/19/15  2:02 PM  Result Value Ref Range Status   Specimen Description FLUID SYNOVIAL RIGHT HIP  Final   Special Requests FLUID ON SWAB  Final   Gram Stain   Final    ABUNDANT WBC PRESENT,BOTH PMN AND MONONUCLEAR NO ORGANISMS SEEN    Culture   Final    FEW STAPHYLOCOCCUS AUREUS CRITICAL RESULT CALLED TO, READ BACK BY AND VERIFIED WITH: B SCOTT 03/20/15 @ M VESTAL    Report Status 03/21/2015 FINAL  Final   Organism ID, Bacteria STAPHYLOCOCCUS AUREUS  Final      Susceptibility   Staphylococcus aureus - MIC*    CIPROFLOXACIN <=0.5 SENSITIVE Sensitive     ERYTHROMYCIN <=0.25 SENSITIVE Sensitive     GENTAMICIN <=0.5 SENSITIVE Sensitive     OXACILLIN 0.5 SENSITIVE Sensitive     TETRACYCLINE <=1 SENSITIVE Sensitive     VANCOMYCIN <=0.5 SENSITIVE Sensitive     TRIMETH/SULFA <=10 SENSITIVE Sensitive     CLINDAMYCIN <=0.25 SENSITIVE Sensitive     RIFAMPIN <=0.5 SENSITIVE Sensitive     Inducible Clindamycin NEGATIVE Sensitive     * FEW STAPHYLOCOCCUS AUREUS  Gram stain     Status: None   Collection Time: 03/19/15  2:02 PM  Result Value Ref Range Status   Specimen Description FLUID SYNOVIAL RIGHT HIP  Final   Special Requests FLUID ON SWAB  Final   Gram Stain   Final    ABUNDANT WBC  PRESENT,BOTH PMN AND MONONUCLEAR NO ORGANISMS SEEN    Report Status 03/19/2015 FINAL  Final  Anaerobic culture     Status: None (Preliminary result)   Collection Time: 03/19/15  2:02 PM  Result Value Ref Range Status   Specimen Description FLUID SYNOVIAL RIGHT HIP  Final   Special Requests FLUID ON SWAB  Final   Gram Stain   Final    ABUNDANT WBC PRESENT,BOTH PMN AND MONONUCLEAR NO ORGANISMS SEEN    Culture   Final    NO ANAEROBES ISOLATED; CULTURE IN PROGRESS FOR 5 DAYS   Report Status PENDING  Incomplete  Tissue culture     Status: None   Collection Time: 03/19/15  3:51 PM  Result Value Ref Range Status   Specimen Description TISSUE RIGHT HIP  Final   Special Requests NONE  Final   Gram Stain   Final    NO WBC SEEN NO ORGANISMS SEEN Performed at [facility]    Culture   Final    NO GROWTH 3 DAYS Performed at [facility]    Report Status 03/23/2015 FINAL  Final    RADIOLOGY STUDIES/RESULTS: Dg Pelvis Portable  03/19/2015  CLINICAL DATA:  Prosthetic  hip infection, postoperative revision. EXAM: PORTABLE PELVIS 1-2 VIEWS COMPARISON:  11/25/2014 and fluoroscopic spot images from 11/20 2/16 FINDINGS: A new right hip prosthesis is in place with some cerclage wires holding the proximal femoral fragment in place, and with the stem extending into the distal femoral shaft fragment. Scattered nodular densities compatible with antibiotic beads in the region of the operative bed and implant. Along the acetabulum there is a bony density shell spacer in place aligned with the axis of the prosthetic femoral head. Rectal staple line noted. Mild degenerative arthropathy of the left hip. IMPRESSION: 1. New right hip implant in place, suspected separate greater trochanteric fragment and shaft fragment, with cerclage wires noted in the vicinity of the trochanteric fragment. Antibiotic beads in place. Bone density shell spacer along the acetabulum. No significant malalignment.  Electronically Signed   By: Van Clines M.D.   On: 03/19/2015 21:20   Dg Hip Operative Unilat With Pelvis Right  03/19/2015  CLINICAL DATA:  Right total hip arthroplasty revision EXAM: OPERATIVE RIGHT HIP (WITH PELVIS IF PERFORMED) 1 VIEWS TECHNIQUE: Fluoroscopic spot image(s) were submitted for interpretation post-operatively. COMPARISON:  11/23/2014 FINDINGS: Limited spot fluoroscopic intraoperative view during the total hip revision. Hardware has been revised. Cerclage wire about the intertrochanteric region. Alignment with the acetabulum is difficult to assess because of the projection and only one view. IMPRESSION: Limited operative imaging during right hip arthroplasty revision. Electronically Signed   By: Jerilynn Mages.  Shick M.D.   On: 03/19/2015 19:04    Oren Binet, MD  [facility] Pager:[phone]  If 7PM-7AM, please contact night-coverage [email or web address] Password [email or web address] 03/23/2015, 2:26 PM   LOS: 4 days

## 2015-03-23 NOTE — Progress Notes (Signed)
ANTICOAGULATION CONSULT NOTE - Initial Consult  Pharmacy Consult for Coumadin (asked to change from Xarelto due to drug interaction with Tegretol) Indication: VTE prophylaxis  No Known Allergies  Patient Measurements: Height: 5\' 1"  (154.9 cm) Weight: 187 lb 4.8 oz (84.959 kg) IBW/kg (Calculated) : 47.8 Heparin Dosing Weight: n/a  Vital Signs: Temp: 98.9 F (37.2 C) (11/26 0454) Temp Source: Oral (11/26 0454) BP: 100/48 mmHg (11/26 0454) Pulse Rate: 93 (11/26 0454)  Labs:  Recent Labs  03/21/15 0327 03/21/15 0346 03/22/15 0402 03/22/15 1827 03/23/15 0400  HGB  --  8.8* 9.1*  --   --   HCT  --  26.0* 27.2*  --   --   PLT  --   --  163  --   --   LABPROT  --   --   --  17.9* 15.8*  INR  --   --   --  1.47 1.24  CREATININE 0.68  --  0.76  --   --     Estimated Creatinine Clearance: 76.8 mL/min (by C-G formula based on Cr of 0.76).   Medical History: Past Medical History  Diagnosis Date  . Hypertension   . Anemia   . Anxiety   . Depression   . Seizures ([place])     rare - last one 07/30/14 - was very anemic  . GERD (gastroesophageal reflux disease)   . IBS (irritable bowel syndrome)   . Barrett esophagus   . Vertigo     thinks related to sinus issues  . Cancer ([place])   . Renal insufficiency     reports acute kidney injury  . History of pneumonia   . History of bronchitis   . Arthritis     Medications:  Scheduled:  . carbamazepine  300 mg Oral BID  .  ceFAZolin (ANCEF) IV  2 g Intravenous 3 times per day  . escitalopram  20 mg Oral Daily  . feeding supplement (ENSURE ENLIVE)  237 mL Oral TID BM  . furosemide  20 mg Oral Daily  . mirtazapine  30 mg Oral QHS  . pantoprazole  40 mg Oral Daily  . traZODone  50 mg Oral QHS  . warfarin  5 mg Oral ONCE-1800  . warfarin   Does not apply Once  . Warfarin - Pharmacist Dosing Inpatient   Does not apply q1800    Assessment: 59 yo female s/p R THA revision.  Was initially started on Xarelto for DVT px, but due to  drug interaction with Tegretol, now switching to Coumadin.  Tegretol can reduce the AUC of Xarelto by up to 50%.  Baseline INR 1.47, but Xarelto can artificially influence INR, so difficult to interpret. INR today 1.24. Pt did not receive dose of warfarin on 11/26. Will give dose this am @1100  and continue 1800 dosing on 11/27. Per Ortho MD, hold on on lovenox bridging. H/H stable   Has received Xarelto 10 mg x 3 doses.  Goal of Therapy:  INR 2-3 Monitor platelets by anticoagulation protocol: Yes   Plan:  1. Coumadin 5 mg x1 @1100  2. Daily PT/INR 3. Needs Coumadin education prior to discharge.  Caidyn Blossom C. Lennox Grumbles, PharmD Pharmacy Resident  Pager: [phone] 03/23/2015 8:51 AM

## 2015-03-24 LAB — ANAEROBIC CULTURE

## 2015-03-25 ENCOUNTER — Encounter (HOSPITAL_COMMUNITY): Payer: Self-pay | Admitting: Orthopedic Surgery

## 2015-03-25 DIAGNOSIS — M25551 Pain in right hip: Secondary | ICD-10-CM | POA: Diagnosis not present

## 2015-03-25 DIAGNOSIS — R630 Anorexia: Secondary | ICD-10-CM | POA: Diagnosis not present

## 2015-03-27 DIAGNOSIS — I10 Essential (primary) hypertension: Secondary | ICD-10-CM | POA: Diagnosis not present

## 2015-03-27 DIAGNOSIS — E871 Hypo-osmolality and hyponatremia: Secondary | ICD-10-CM | POA: Diagnosis not present

## 2015-03-27 DIAGNOSIS — R0989 Other specified symptoms and signs involving the circulatory and respiratory systems: Secondary | ICD-10-CM | POA: Diagnosis not present

## 2015-04-01 DIAGNOSIS — M25551 Pain in right hip: Secondary | ICD-10-CM | POA: Diagnosis not present

## 2015-04-01 DIAGNOSIS — D6489 Other specified anemias: Secondary | ICD-10-CM | POA: Diagnosis not present

## 2015-04-01 DIAGNOSIS — G4709 Other insomnia: Secondary | ICD-10-CM | POA: Diagnosis not present

## 2015-04-03 DIAGNOSIS — M25551 Pain in right hip: Secondary | ICD-10-CM | POA: Diagnosis not present

## 2015-04-03 DIAGNOSIS — B999 Unspecified infectious disease: Secondary | ICD-10-CM | POA: Diagnosis not present

## 2015-04-03 DIAGNOSIS — A419 Sepsis, unspecified organism: Secondary | ICD-10-CM | POA: Diagnosis not present

## 2015-04-03 DIAGNOSIS — M009 Pyogenic arthritis, unspecified: Secondary | ICD-10-CM | POA: Diagnosis not present

## 2015-04-10 DIAGNOSIS — G478 Other sleep disorders: Secondary | ICD-10-CM | POA: Diagnosis not present

## 2015-04-10 DIAGNOSIS — M62838 Other muscle spasm: Secondary | ICD-10-CM | POA: Diagnosis not present

## 2015-04-10 DIAGNOSIS — T8451XA Infection and inflammatory reaction due to internal right hip prosthesis, initial encounter: Secondary | ICD-10-CM | POA: Diagnosis not present

## 2015-04-15 DIAGNOSIS — F320 Major depressive disorder, single episode, mild: Secondary | ICD-10-CM | POA: Diagnosis not present

## 2015-04-15 DIAGNOSIS — M25551 Pain in right hip: Secondary | ICD-10-CM | POA: Diagnosis not present

## 2015-04-16 ENCOUNTER — Ambulatory Visit (INDEPENDENT_AMBULATORY_CARE_PROVIDER_SITE_OTHER): Payer: Medicare Other | Admitting: Infectious Diseases

## 2015-04-16 ENCOUNTER — Encounter: Payer: Self-pay | Admitting: Infectious Diseases

## 2015-04-16 VITALS — BP 112/74 | HR 71 | Temp 98.5°F | Wt 182.0 lb

## 2015-04-16 DIAGNOSIS — A047 Enterocolitis due to Clostridium difficile: Secondary | ICD-10-CM

## 2015-04-16 DIAGNOSIS — R7309 Other abnormal glucose: Secondary | ICD-10-CM

## 2015-04-16 DIAGNOSIS — R739 Hyperglycemia, unspecified: Secondary | ICD-10-CM

## 2015-04-16 DIAGNOSIS — T8459XS Infection and inflammatory reaction due to other internal joint prosthesis, sequela: Secondary | ICD-10-CM

## 2015-04-16 DIAGNOSIS — M00051 Staphylococcal arthritis, right hip: Secondary | ICD-10-CM

## 2015-04-16 DIAGNOSIS — A4901 Methicillin susceptible Staphylococcus aureus infection, unspecified site: Secondary | ICD-10-CM | POA: Diagnosis not present

## 2015-04-16 DIAGNOSIS — D649 Anemia, unspecified: Secondary | ICD-10-CM

## 2015-04-16 DIAGNOSIS — Z96649 Presence of unspecified artificial hip joint: Secondary | ICD-10-CM

## 2015-04-16 DIAGNOSIS — A0472 Enterocolitis due to Clostridium difficile, not specified as recurrent: Secondary | ICD-10-CM

## 2015-04-16 NOTE — Assessment & Plan Note (Signed)
She has elevated Glc at SNF. Will ask her SNF to check her A1C and f/u.

## 2015-04-16 NOTE — Assessment & Plan Note (Signed)
She needs repeat labs.  Will defer to her SNF, PCP.

## 2015-04-16 NOTE — Assessment & Plan Note (Signed)
resected 03-19-15

## 2015-04-16 NOTE — Assessment & Plan Note (Signed)
See above

## 2015-04-16 NOTE — Assessment & Plan Note (Signed)
Will aim fo rher to get at least 8 weeks of IV ancef.  Need repeat labs.  Await her repeat joint aspirate from Dr Marlou Sa (per pt).  Will see her back in early January (2nd week)

## 2015-04-16 NOTE — Assessment & Plan Note (Signed)
Has  Been quiescent so far, continue to monitor

## 2015-04-16 NOTE — Progress Notes (Signed)
   Subjective:    Patient ID: Christina French, female    DOB: 05-28-1955, 59 y.o.   MRN: [id number]  HPI 59 yo F with hx of R THR 2007.  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  She then felt the acute onset of R hip pain and 1 month later came to [facility] after developing fever to 103, WBC 22k. She was adm on 11-24-14, had aspirate of a fluid collection at her R hip which grew MSSA.  She was taken to OR on 8-3 and had I & D and placement of anbx beads. She was placed on ancef-rifampin with the plan for her to complete 8 weeks of therapy. Her course was also complicated by C diff.  After completing IV anbx, she was maintained on rifampin and keflex. She returned to OR on 11-22 and removal of her previous prosthesis. Her Cx again grew MSSA. She was d/c to SNF on ancef.  Her labs are notable for increased ESR as well as increased Glc (121-141).  12-21-2909-30 ESR 13533    32 CRP37.215.5    54.8 HgB        8.8  Wound is healing "great".  No fevers or chills. No problems with PIC line.  Day 29 of IV ancef   Review of Systems  Constitutional: Negative for fever and chills.  Respiratory: Negative for cough and shortness of breath.   Gastrointestinal: Negative for diarrhea and constipation.  Genitourinary: Negative for difficulty urinating.       Objective:   Physical Exam  Constitutional: She appears well-developed and well-nourished.  HENT:  Mouth/Throat: No oropharyngeal exudate.  Eyes: EOM are normal. Pupils are equal, round, and reactive to light.  Neck: Neck supple.  Cardiovascular: Normal rate, regular rhythm and normal heart sounds.   Pulmonary/Chest: Effort normal and breath sounds normal.  Abdominal: Soft. Bowel sounds are normal. There is no tenderness. There is no rebound.  Musculoskeletal: She exhibits no edema.       Arms:      Legs: Lymphadenopathy:      She has no cervical adenopathy.       Assessment & Plan:

## 2015-04-29 DIAGNOSIS — E871 Hypo-osmolality and hyponatremia: Secondary | ICD-10-CM | POA: Diagnosis not present

## 2015-04-29 DIAGNOSIS — I10 Essential (primary) hypertension: Secondary | ICD-10-CM | POA: Diagnosis not present

## 2015-04-29 DIAGNOSIS — T8451XA Infection and inflammatory reaction due to internal right hip prosthesis, initial encounter: Secondary | ICD-10-CM | POA: Diagnosis not present

## 2015-04-30 ENCOUNTER — Telehealth: Payer: Self-pay | Admitting: *Deleted

## 2015-04-30 NOTE — Telephone Encounter (Signed)
Patient is to be discharged from [facility], Raechel Chute, RN needed to clarify stop date for IV antibiotics. Per last office note, patient is supposed to complete at least 8 weeks of IV invanz. Patient is to follow up 1/17 for assessment. Patient is being set up with [facility] by SNF upon discharge. RN asked that they continue to fax lab results to [place] weekly. Landis Gandy, RN

## 2015-05-01 DIAGNOSIS — T814XXA Infection following a procedure, initial encounter: Secondary | ICD-10-CM | POA: Diagnosis not present

## 2015-05-02 DIAGNOSIS — T8451XD Infection and inflammatory reaction due to internal right hip prosthesis, subsequent encounter: Secondary | ICD-10-CM | POA: Diagnosis not present

## 2015-05-06 DIAGNOSIS — T8451XD Infection and inflammatory reaction due to internal right hip prosthesis, subsequent encounter: Secondary | ICD-10-CM | POA: Diagnosis not present

## 2015-05-06 DIAGNOSIS — A4901 Methicillin susceptible Staphylococcus aureus infection, unspecified site: Secondary | ICD-10-CM | POA: Diagnosis not present

## 2015-05-13 ENCOUNTER — Telehealth: Payer: Self-pay | Admitting: *Deleted

## 2015-05-13 DIAGNOSIS — B9562 Methicillin resistant Staphylococcus aureus infection as the cause of diseases classified elsewhere: Secondary | ICD-10-CM | POA: Diagnosis not present

## 2015-05-13 DIAGNOSIS — T8451XD Infection and inflammatory reaction due to internal right hip prosthesis, subsequent encounter: Secondary | ICD-10-CM | POA: Diagnosis not present

## 2015-05-13 NOTE — Telephone Encounter (Signed)
Nurse with [facility] called to advise that the patient is supposed to be done with antibiotic but still has doses in the home until Saturday 05/18/15. She was also supposed to follow up here tomorrow 05/14/15 but canceled the appt. We need to know should she D/C the PICC or try and get the patient to reschedule the follow up and wait until after the appt. Advised her will ask the doctor and get back to her. Called the patient and she is not interested in coming for a visit, will let the doctor know.

## 2015-05-14 ENCOUNTER — Ambulatory Visit: Payer: Self-pay | Admitting: Infectious Diseases

## 2015-05-14 NOTE — Telephone Encounter (Signed)
Advised nurse to D/C the PICC 05/18/15, per Dr Johnnye Sima and will try to get the patient to come in for a visit.  Called the patient and had to leave a message for her to call and get an appointment asap.

## 2015-05-14 NOTE — Telephone Encounter (Signed)
Reschedule appt.  Complete anbx and pull pic on 1-21 thanks

## 2015-05-20 DIAGNOSIS — T8451XD Infection and inflammatory reaction due to internal right hip prosthesis, subsequent encounter: Secondary | ICD-10-CM | POA: Diagnosis not present

## 2015-05-21 DIAGNOSIS — M25551 Pain in right hip: Secondary | ICD-10-CM | POA: Diagnosis not present

## 2015-05-27 DIAGNOSIS — T8451XD Infection and inflammatory reaction due to internal right hip prosthesis, subsequent encounter: Secondary | ICD-10-CM | POA: Diagnosis not present

## 2015-05-27 DIAGNOSIS — B9562 Methicillin resistant Staphylococcus aureus infection as the cause of diseases classified elsewhere: Secondary | ICD-10-CM | POA: Diagnosis not present

## 2015-05-27 DIAGNOSIS — Z5181 Encounter for therapeutic drug level monitoring: Secondary | ICD-10-CM | POA: Diagnosis not present

## 2015-05-27 DIAGNOSIS — I10 Essential (primary) hypertension: Secondary | ICD-10-CM | POA: Diagnosis not present

## 2015-05-27 DIAGNOSIS — Z452 Encounter for adjustment and management of vascular access device: Secondary | ICD-10-CM | POA: Diagnosis not present

## 2015-05-29 ENCOUNTER — Encounter: Payer: Self-pay | Admitting: Infectious Diseases

## 2015-06-01 DIAGNOSIS — M25551 Pain in right hip: Secondary | ICD-10-CM | POA: Diagnosis not present

## 2015-06-07 ENCOUNTER — Other Ambulatory Visit
Admission: RE | Admit: 2015-06-07 | Discharge: 2015-06-07 | Disposition: A | Discharge status: Home / Self Care | Payer: Medicare Other | Source: Ambulatory Visit | Attending: Orthopedic Surgery | Admitting: Orthopedic Surgery

## 2015-06-07 DIAGNOSIS — M25551 Pain in right hip: Secondary | ICD-10-CM | POA: Insufficient documentation

## 2015-06-07 LAB — CBC
HCT: 36 % (ref 35.0–47.0)
HEMOGLOBIN: 12.1 g/dL (ref 12.0–16.0)
MCH: 26.6 pg (ref 26.0–34.0)
MCHC: 33.6 g/dL (ref 32.0–36.0)
MCV: 79.2 fL — ABNORMAL LOW (ref 80.0–100.0)
PLATELETS: 171 10*3/uL (ref 150–440)
RBC: 4.54 MIL/uL (ref 3.80–5.20)
RDW: 16.4 % — ABNORMAL HIGH (ref 11.5–14.5)
WBC: 4.8 10*3/uL (ref 3.6–11.0)

## 2015-06-07 LAB — C-REACTIVE PROTEIN: CRP: 1.1 mg/dL — AB (ref <1.0)

## 2015-06-07 LAB — SEDIMENTATION RATE: SED RATE: 13 mm/h (ref 0–30)

## 2015-06-10 DIAGNOSIS — Z0001 Encounter for general adult medical examination with abnormal findings: Secondary | ICD-10-CM | POA: Diagnosis not present

## 2015-06-10 DIAGNOSIS — D509 Iron deficiency anemia, unspecified: Secondary | ICD-10-CM | POA: Diagnosis not present

## 2015-06-10 DIAGNOSIS — K219 Gastro-esophageal reflux disease without esophagitis: Secondary | ICD-10-CM | POA: Diagnosis not present

## 2015-06-10 DIAGNOSIS — I10 Essential (primary) hypertension: Secondary | ICD-10-CM | POA: Diagnosis not present

## 2015-06-10 DIAGNOSIS — E039 Hypothyroidism, unspecified: Secondary | ICD-10-CM | POA: Diagnosis not present

## 2015-06-10 DIAGNOSIS — K648 Other hemorrhoids: Secondary | ICD-10-CM | POA: Diagnosis not present

## 2015-07-04 DIAGNOSIS — M25551 Pain in right hip: Secondary | ICD-10-CM | POA: Diagnosis not present

## 2015-07-15 ENCOUNTER — Other Ambulatory Visit: Payer: Self-pay | Admitting: Orthopedic Surgery

## 2015-07-15 DIAGNOSIS — M25551 Pain in right hip: Secondary | ICD-10-CM

## 2015-08-06 ENCOUNTER — Ambulatory Visit
Admission: RE | Admit: 2015-08-06 | Discharge: 2015-08-06 | Disposition: A | Discharge status: Home / Self Care | Payer: Medicare Other | Source: Ambulatory Visit | Attending: Orthopedic Surgery | Admitting: Orthopedic Surgery

## 2015-08-06 DIAGNOSIS — M25551 Pain in right hip: Secondary | ICD-10-CM

## 2015-08-22 DIAGNOSIS — M25551 Pain in right hip: Secondary | ICD-10-CM | POA: Diagnosis not present

## 2015-08-23 ENCOUNTER — Other Ambulatory Visit: Payer: Self-pay | Admitting: Orthopedic Surgery

## 2015-08-30 DIAGNOSIS — Z79899 Other long term (current) drug therapy: Secondary | ICD-10-CM | POA: Diagnosis not present

## 2015-09-05 ENCOUNTER — Encounter (HOSPITAL_COMMUNITY)
Admission: RE | Admit: 2015-09-05 | Discharge: 2015-09-05 | Disposition: A | Discharge status: Home / Self Care | Payer: Medicare Other | Source: Ambulatory Visit | Attending: Orthopedic Surgery | Admitting: Orthopedic Surgery

## 2015-09-05 DIAGNOSIS — Z01812 Encounter for preprocedural laboratory examination: Secondary | ICD-10-CM | POA: Diagnosis not present

## 2015-09-05 DIAGNOSIS — K219 Gastro-esophageal reflux disease without esophagitis: Secondary | ICD-10-CM | POA: Insufficient documentation

## 2015-09-05 DIAGNOSIS — Z0183 Encounter for blood typing: Secondary | ICD-10-CM | POA: Diagnosis not present

## 2015-09-05 DIAGNOSIS — N289 Disorder of kidney and ureter, unspecified: Secondary | ICD-10-CM | POA: Diagnosis not present

## 2015-09-05 DIAGNOSIS — G40209 Localization-related (focal) (partial) symptomatic epilepsy and epileptic syndromes with complex partial seizures, not intractable, without status epilepticus: Secondary | ICD-10-CM | POA: Insufficient documentation

## 2015-09-05 DIAGNOSIS — Z01818 Encounter for other preprocedural examination: Secondary | ICD-10-CM | POA: Diagnosis not present

## 2015-09-05 DIAGNOSIS — I10 Essential (primary) hypertension: Secondary | ICD-10-CM | POA: Diagnosis not present

## 2015-09-05 DIAGNOSIS — Z79899 Other long term (current) drug therapy: Secondary | ICD-10-CM | POA: Insufficient documentation

## 2015-09-05 DIAGNOSIS — R918 Other nonspecific abnormal finding of lung field: Secondary | ICD-10-CM | POA: Insufficient documentation

## 2015-09-05 LAB — BASIC METABOLIC PANEL
Anion gap: 13 (ref 5–15)
BUN: 14 mg/dL (ref 6–20)
CALCIUM: 9.8 mg/dL (ref 8.9–10.3)
CHLORIDE: 94 mmol/L — AB (ref 101–111)
CO2: 25 mmol/L (ref 22–32)
CREATININE: 0.96 mg/dL (ref 0.44–1.00)
Glucose, Bld: 113 mg/dL — ABNORMAL HIGH (ref 65–99)
Potassium: 4.1 mmol/L (ref 3.5–5.1)
Sodium: 132 mmol/L — ABNORMAL LOW (ref 135–145)

## 2015-09-05 LAB — CBC WITH DIFFERENTIAL/PLATELET
BASOS ABS: 0 10*3/uL (ref 0.0–0.1)
BASOS PCT: 0 %
EOS ABS: 0.1 10*3/uL (ref 0.0–0.7)
EOS PCT: 2 %
HCT: 33.1 % — ABNORMAL LOW (ref 36.0–46.0)
HEMOGLOBIN: 11 g/dL — AB (ref 12.0–15.0)
Lymphocytes Relative: 25 %
Lymphs Abs: 1.3 10*3/uL (ref 0.7–4.0)
MCH: 26.6 pg (ref 26.0–34.0)
MCHC: 33.2 g/dL (ref 30.0–36.0)
MCV: 80.1 fL (ref 78.0–100.0)
Monocytes Absolute: 0.5 10*3/uL (ref 0.1–1.0)
Monocytes Relative: 10 %
NEUTROS PCT: 63 %
Neutro Abs: 3.4 10*3/uL (ref 1.7–7.7)
Platelets: 136 10*3/uL — ABNORMAL LOW (ref 150–400)
RBC: 4.13 MIL/uL (ref 3.87–5.11)
RDW: 13.2 % (ref 11.5–15.5)
WBC: 5.3 10*3/uL (ref 4.0–10.5)

## 2015-09-05 LAB — PREPARE RBC (CROSSMATCH)

## 2015-09-05 LAB — PROTIME-INR
INR: 1.11 (ref 0.00–1.49)
PROTHROMBIN TIME: 14.5 s (ref 11.6–15.2)

## 2015-09-05 LAB — SURGICAL PCR SCREEN
MRSA, PCR: NEGATIVE
STAPHYLOCOCCUS AUREUS: NEGATIVE

## 2015-09-05 LAB — APTT: aPTT: 27 seconds (ref 24–37)

## 2015-09-05 MED ORDER — CHLORHEXIDINE GLUCONATE 4 % EX LIQD: 60.0000 mL | Freq: Once | CUTANEOUS | Status: DC

## 2015-09-05 NOTE — Pre-Procedure Instructions (Signed)
    SHENEKA DILLAHUNTY  09/05/2015      [facility] - [address], [place] [address] [place] [address] Phone: [phone] Fax: [phone]    Your procedure is scheduled on Sep 16, 2015.  Report to [facility] Admitting at 8:35 A.M.  Call this number if you have problems the morning of surgery:  [phone]   Remember:  Do not eat food or drink liquids after midnight.  Take these medicines the morning of surgery with A SIP OF WATER : atenolol (TENORMIN), escitalopram (LEXAPRO), pantoprazole (PROTONIX), carbamazepine (TEGRETOL)   IF NEEDED- ALPRAZolam (XANAX), ondansetron (ZOFRAN)   Do not wear jewelry, make-up or nail polish.  Do not wear lotions, powders, or perfumes.  You may wear deodorant.  Do not shave 48 hours prior to surgery.    Do not bring valuables to the hospital.  [facility] is not responsible for any belongings or valuables.  Contacts, dentures or bridgework may not be worn into surgery.  Leave your suitcase in the car.  After surgery it may be brought to your room.  For patients admitted to the hospital, discharge time will be determined by your treatment team.  Patients discharged the day of surgery will not be allowed to drive home.   Name and phone number of your driver:    Special instructions:  Preparing for surgery  Please read over the following fact sheets that you were given. Pain Booklet, Coughing and Deep Breathing, Blood Transfusion Information and Surgical Site Infection Prevention

## 2015-09-05 NOTE — Progress Notes (Signed)
Pt unable to void at PAT.  Will bring sample day of surgery.

## 2015-09-06 ENCOUNTER — Encounter (HOSPITAL_COMMUNITY): Payer: Self-pay | Admitting: Emergency Medicine

## 2015-09-06 NOTE — Progress Notes (Signed)
Anesthesia Chart Review:  Pt is a 60 year old female scheduled for R total hip revision on 09/16/2015 with Dr. Mayer Camel.   PCP is Dr. Clayborn Bigness. Neurologist is Dr. 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 Never smoker. BMI 33.5. S/p R total hip revision 03/19/15  Medications include: atenolol, tegretol, protonix.   Preoperative labs reviewed.    Chest x-ray 09/05/15 reviewed.  - Focal rounded consolidative opacity within the left lower hemithorax, potentially representing pneumonia in the appropriate clinical setting.  - Followup PA and lateral chest X-ray is recommended in 3-4 weeks following trial of antibiotic therapy to ensure resolution and exclude underlying malignancy. - Dr. Damita Dunnings office was notified of abnormal CXR and is sending pt to be evaluated by PCP.   EKG 09/05/15: NSR.   Echo 11/27/14:  - Left ventricle: The cavity size was normal. Wall thickness was normal. Systolic function was normal. The estimated ejection fraction was in the range of 60% to 65%. Wall motion was normal; there were no regional wall motion abnormalities. - Aortic valve: Mildly calcified annulus.  Will revisit chart after pt has been evaluated by PCP.   Willeen Cass, FNP-BC [facility]/Anesthesiology Phone: [phone] 09/06/2015 3:16 PM

## 2015-09-06 NOTE — Progress Notes (Signed)
I received a call from Joanell Rising who stated that they will have patient see PCP for ? Pneumonia that was seen on Chest X- Ray   09/05/15.

## 2015-09-06 NOTE — Progress Notes (Signed)
I called Dr Damita Dunnings office an d spoke with Lattie Haw, she  Will send a message to r Mayer Camel to review Chest X- Ray that was done 5/11 in PAT.

## 2015-09-09 NOTE — Progress Notes (Signed)
Requested copy of OV from office left message

## 2015-09-11 NOTE — Progress Notes (Signed)
Called Dr. Laurelyn Sickle office and they do not have patient schedule for an appointment until June. Notified Royann Shivers  In Short Stay.

## 2015-09-13 NOTE — H&P (Signed)
TOTAL HIP REVISION ADMISSION H&P  Patient is admitted for right revision total hip arthroplasty.  Subjective:  Chief Complaint: right hip pain  HPI: Christina French, 60 y.o. female, has a history of pain and functional disability in the right hip due to arthritis and patient has failed non-surgical conservative treatments for greater than 12 weeks to include NSAID's and/or analgesics, flexibility and strengthening excercises, use of assistive devices, weight reduction as appropriate and activity modification. The indications for the revision total hip arthroplasty are history of total hip infection and bearing surface wear leading to  symptomatic synovitis and infection.  Onset of symptoms was abrupt starting 1 years ago with rapidlly worsening course since that time.  Prior procedures on the right hip include arthroplasty and placement of prostalac prosthesis.  Patient currently rates pain in the right hip at 10 out of 10 with activity.  There is night pain, worsening of pain with activity and weight bearing, trendelenberg gait, pain that interfers with activities of daily living and pain with passive range of motion. Patient has evidence of AP and lateral of the right hip show the cables in place a Prostalac and placed and some exuberant heterotopic bone along the intertrochanteric area. by imaging studies.  This condition presents safety issues increasing the risk of falls.    There is no current active infection.  Patient Active Problem List   Diagnosis Date Noted  . Anemia 04/16/2015  . Anxiety disorder 03/20/2015  . Acute hyperglycemia 03/20/2015  . Pulse irregularity 03/20/2015  . Dehydration 03/20/2015  . Septic arthritis ([place])   . Prosthetic hip infection ([place])   . Staphylococcus aureus infection   . Enteritis due to Clostridium difficile   . Screen for STD (sexually transmitted disease)   . Acute pain of right hip; hip infection 11/24/2014  . Right hip pain 11/24/2014  . Chronic  hyponatremia 11/24/2014  . Seizure disorder ([place]) 08/17/2013  . Digestive disorder 08/17/2013  . HTN (hypertension) 08/17/2013  . HLD (hyperlipidemia) 08/17/2013   Past Medical History  Diagnosis Date  . Hypertension   . Anemia   . Anxiety   . Depression   . Seizures ([place])     rare - last one 07/30/14 - was very anemic  . GERD (gastroesophageal reflux disease)   . IBS (irritable bowel syndrome)   . Barrett esophagus   . Vertigo     thinks related to sinus issues  . Cancer ([place])   . Renal insufficiency     reports acute kidney injury  . History of pneumonia   . History of bronchitis   . Arthritis     Past Surgical History  Procedure Laterality Date  . Abdominal hysterectomy    . Hemorrhoid surgery    . Joint replacement Right 2007    hip  . Cyst excision  2011    from throat  . Cesarean section    . Colonoscopy N/A 08/31/2014    Procedure: COLONOSCOPY;  Surgeon: Lucilla Lame, MD;  Location: [facility];  Service: Gastroenterology;  Laterality: N/A;  . Esophagogastroduodenoscopy N/A 08/31/2014    Procedure: ESOPHAGOGASTRODUODENOSCOPY (EGD);  Surgeon: Lucilla Lame, MD;  Location: [facility];  Service: Gastroenterology;  Laterality: N/A;  . Incision and drainage hip Right 11/25/2014    Procedure: IRRIGATION  AND DRAINAGEOF RIGHT HIP WITH PLACEMENT OF ANTIBIOUTIC BEADS;  Surgeon: Mcarthur Rossetti, MD;  Location: [facility];  Service: Orthopedics;  Laterality: Right;  . Incision and drainage hip Right 11/28/2014  Procedure: Repeat I&D Right Hip;  Surgeon: Mcarthur Rossetti, MD;  Location: [facility];  Service: Orthopedics;  Laterality: Right;  . Total hip revision Right 03/19/2015    Procedure: RIGHT TOTAL HIP ARTHROPLASTY REVISION;  Surgeon: Meredith Pel, MD;  Location: [facility];  Service: Orthopedics;  Laterality: Right;    No prescriptions prior to admission   No Known Allergies  Social History  Substance Use Topics  . Smoking status: Never Smoker   .  Smokeless tobacco: Never Used  . Alcohol Use: No    Family History  Problem Relation Age of Onset  . Alcoholism Father   . Throat cancer Mother       Review of Systems  Constitutional: Positive for malaise/fatigue.  HENT:       Sinus problems  Eyes: Negative.   Respiratory: Negative.   Cardiovascular:       HTN  Gastrointestinal: Positive for abdominal pain.       Poor appetite, rectal bleeding  Genitourinary: Negative.   Musculoskeletal: Positive for myalgias and joint pain.  Skin: Negative.   Neurological: Positive for seizures.  Endo/Heme/Allergies: Bruises/bleeds easily.  Psychiatric/Behavioral: Positive for depression. The patient is nervous/anxious.        Panic attacks    Objective:  Physical Exam  Constitutional: She is oriented to person, place, and time. She appears well-developed and well-nourished.  HENT:  Head: Normocephalic and atraumatic.  Eyes: Pupils are equal, round, and reactive to light.  Neck: Normal range of motion. Neck supple.  Cardiovascular: Intact distal pulses.   Respiratory: Effort normal.  Musculoskeletal: She exhibits tenderness.  Surgical scar is well-healed.  She has a few degrees of internal and external rotation.    Neurological: She is alert and oriented to person, place, and time.  Skin: Skin is warm and dry.  Psychiatric: She has a normal mood and affect. Her behavior is normal. Judgment and thought content normal.    Vital signs in last 24 hours:     Labs:   Estimated body mass index is 34.41 kg/(m^2) as calculated from the following:   Height as of 03/19/15: 5\' 1"  (1.549 m).   Weight as of 04/16/15: 82.555 kg (182 lb).  Imaging Review:  Plain radiographs demonstrate AP and lateral of the right hip show the cables in place a Prostalac and placed and some exuberant heterotopic bone along the intertrochanteric area.  CT scan was accomplished showing adequate bone stock for revision, although we'll be a large amount of  scar tissue and heterotopic bone around the joint itself.  She is neurovascularly intact distally.  Although the limb is about 3 cm short.  Assessment/Plan:  End stage arthritis, right hip(s) with failed previous arthroplasty. No evidence of recurrent infection.  After removal of implants and placement of a Prostalac prosthesis November 2016 for an MSSA infection that occurred in her ASR hip in July 2016  The patient history, physical examination, clinical judgement of the provider and imaging studies are consistent with end stage degenerative joint disease of the right hip(s), previous total hip arthroplasty. Revision total hip arthroplasty is deemed medically necessary. The treatment options including medical management, injection therapy, arthroscopy and arthroplasty were discussed at length. The risks and benefits of total hip arthroplasty were presented and reviewed. The risks due to aseptic loosening, infection, stiffness, dislocation/subluxation,  thromboembolic complications and other imponderables were discussed.  The patient acknowledged the explanation, agreed to proceed with the plan and consent was signed. Patient is being admitted for inpatient  treatment for surgery, pain control, PT, OT, prophylactic antibiotics, VTE prophylaxis, progressive ambulation and ADL's and discharge planning. The patient is planning to be discharged home with home health services

## 2015-09-16 ENCOUNTER — Encounter (HOSPITAL_COMMUNITY): Admission: RE | Payer: Self-pay | Source: Ambulatory Visit

## 2015-09-16 ENCOUNTER — Inpatient Hospital Stay (HOSPITAL_COMMUNITY): Admission: RE | Admit: 2015-09-16 | Payer: Medicare Other | Source: Ambulatory Visit | Admitting: Orthopedic Surgery

## 2015-09-16 DIAGNOSIS — J158 Pneumonia due to other specified bacteria: Secondary | ICD-10-CM | POA: Diagnosis not present

## 2015-09-16 DIAGNOSIS — M25551 Pain in right hip: Secondary | ICD-10-CM | POA: Diagnosis not present

## 2015-09-16 SURGERY — TOTAL HIP REVISION
Anesthesia: Spinal | Laterality: Right

## 2015-09-20 LAB — TYPE AND SCREEN
ABO/RH(D): O NEG
Antibody Screen: NEGATIVE
DONOR AG TYPE: NEGATIVE
DONOR AG TYPE: NEGATIVE
Donor AG Type: NEGATIVE
Donor AG Type: NEGATIVE
UNIT DIVISION: 0
UNIT DIVISION: 0
UNIT DIVISION: 0
UNIT DIVISION: 0

## 2015-09-27 ENCOUNTER — Other Ambulatory Visit: Payer: Self-pay | Admitting: Nurse Practitioner

## 2015-09-27 ENCOUNTER — Ambulatory Visit
Admission: RE | Admit: 2015-09-27 | Discharge: 2015-09-27 | Disposition: A | Discharge status: Home / Self Care | Payer: Medicare Other | Source: Ambulatory Visit | Attending: Nurse Practitioner | Admitting: Nurse Practitioner

## 2015-09-27 DIAGNOSIS — J189 Pneumonia, unspecified organism: Secondary | ICD-10-CM

## 2015-09-27 HISTORY — DX: Pneumonia, unspecified organism: J18.9

## 2015-10-14 ENCOUNTER — Other Ambulatory Visit: Payer: Self-pay | Admitting: Orthopedic Surgery

## 2015-11-07 MED ORDER — CEFAZOLIN SODIUM-DEXTROSE 2-4 GM/100ML-% IV SOLN: 2.0000 g | INTRAVENOUS | Status: DC

## 2015-11-07 MED ORDER — DEXTROSE-NACL 5-0.45 % IV SOLN: INTRAVENOUS | Status: DC

## 2015-11-07 NOTE — Pre-Procedure Instructions (Signed)
    JANNET PUERTO  11/07/2015      Your procedure is scheduled on Monday, July 24  Report to [facility] Admitting at 7:50 A.M.              Your surgery or procedure is scheduled for 9:50 AM   Call this number if you have problems the morning of [phone]              For any other questions, please call [phone], Monday - Friday 8 AM - 4 PM.   Remember:  Do not eat food or drink liquids after midnight Sunday, July 23.  Take these medicines the morning of surgery with A SIP OF WATER:atenolol (TENORMIN), baclofen (LIORESAL), carbamazepine (TEGRETOL), pantoprazole (PROTONIX), escitalopram (LEXAPRO).                   May take Acetaminophen and Alprazolam (Xanax) if needed.   Do not wear jewelry, make-up or nail polish.  Do not wear lotions, powders, or perfumes.    Do not shave 48 hours prior to surgery.    Do not bring valuables to the hospital.  [facility] is not responsible for any belongings or valuables.  Contacts, dentures or bridgework may not be worn into surgery.  Leave your suitcase in the car.  After surgery it may be brought to your room.  For patients admitted to the hospital, discharge time will be determined by your treatment team.  Special instructions:  Review  [facility] - Preparing For Surgery.  Please read over the following fact sheets that you were given. [facility]- Preparing For Surgery and Patient Instructions for Mupirocin Application, Incentive Spirometry.

## 2015-11-08 ENCOUNTER — Encounter (HOSPITAL_COMMUNITY)
Admission: RE | Admit: 2015-11-08 | Discharge: 2015-11-08 | Disposition: A | Discharge status: Home / Self Care | Payer: Medicare Other | Source: Ambulatory Visit | Attending: Orthopedic Surgery | Admitting: Orthopedic Surgery

## 2015-11-08 ENCOUNTER — Encounter (HOSPITAL_COMMUNITY): Payer: Self-pay

## 2015-11-08 DIAGNOSIS — Z01812 Encounter for preprocedural laboratory examination: Secondary | ICD-10-CM | POA: Diagnosis not present

## 2015-11-08 HISTORY — DX: Pneumonia, unspecified organism: J18.9

## 2015-11-08 HISTORY — DX: Personal history of other medical treatment: Z92.89

## 2015-11-08 HISTORY — DX: Post-traumatic stress disorder, unspecified: F43.10

## 2015-11-08 LAB — BASIC METABOLIC PANEL
Anion gap: 8 (ref 5–15)
BUN: 12 mg/dL (ref 6–20)
CHLORIDE: 94 mmol/L — AB (ref 101–111)
CO2: 26 mmol/L (ref 22–32)
CREATININE: 0.78 mg/dL (ref 0.44–1.00)
Calcium: 9.3 mg/dL (ref 8.9–10.3)
GFR calc Af Amer: >60 mL/min (ref >60)
GFR calc non Af Amer: >60 mL/min (ref >60)
GLUCOSE: 102 mg/dL — AB (ref 65–99)
Potassium: 3.9 mmol/L (ref 3.5–5.1)
SODIUM: 128 mmol/L — AB (ref 135–145)

## 2015-11-08 LAB — PROTIME-INR
INR: 1.16 (ref 0.00–1.49)
Prothrombin Time: 15 seconds (ref 11.6–15.2)

## 2015-11-08 LAB — CBC WITH DIFFERENTIAL/PLATELET
Basophils Absolute: 0 10*3/uL (ref 0.0–0.1)
Basophils Relative: 0 %
EOS ABS: 0.1 10*3/uL (ref 0.0–0.7)
EOS PCT: 2 %
HCT: 29.4 % — ABNORMAL LOW (ref 36.0–46.0)
Hemoglobin: 9.6 g/dL — ABNORMAL LOW (ref 12.0–15.0)
LYMPHS ABS: 1.6 10*3/uL (ref 0.7–4.0)
LYMPHS PCT: 26 %
MCH: 27.3 pg (ref 26.0–34.0)
MCHC: 32.7 g/dL (ref 30.0–36.0)
MCV: 83.5 fL (ref 78.0–100.0)
MONO ABS: 0.4 10*3/uL (ref 0.1–1.0)
MONOS PCT: 7 %
Neutro Abs: 3.9 10*3/uL (ref 1.7–7.7)
Neutrophils Relative %: 65 %
PLATELETS: 165 10*3/uL (ref 150–400)
RBC: 3.52 MIL/uL — AB (ref 3.87–5.11)
RDW: 14.4 % (ref 11.5–15.5)
WBC: 6 10*3/uL (ref 4.0–10.5)

## 2015-11-08 LAB — URINALYSIS, ROUTINE W REFLEX MICROSCOPIC
BILIRUBIN URINE: NEGATIVE
Glucose, UA: NEGATIVE mg/dL
HGB URINE DIPSTICK: NEGATIVE
Ketones, ur: NEGATIVE mg/dL
Nitrite: NEGATIVE
PH: 6 (ref 5.0–8.0)
Protein, ur: NEGATIVE mg/dL
SPECIFIC GRAVITY, URINE: 1.02 (ref 1.005–1.030)

## 2015-11-08 LAB — PREPARE RBC (CROSSMATCH)

## 2015-11-08 LAB — URINE MICROSCOPIC-ADD ON: RBC / HPF: NONE SEEN RBC/hpf (ref 0–5)

## 2015-11-08 LAB — SURGICAL PCR SCREEN
MRSA, PCR: NEGATIVE
Staphylococcus aureus: NEGATIVE

## 2015-11-08 LAB — APTT: aPTT: 29 seconds (ref 24–37)

## 2015-11-08 NOTE — H&P (Signed)
TOTAL HIP REVISION ADMISSION H&P  Patient is admitted for right revision total hip arthroplasty.  Subjective:  Chief Complaint: right hip pain  HPI: Christina French, 60 y.o. female, has a history of pain and functional disability in the right hip due to arthritis and patient has failed non-surgical conservative treatments for greater than 12 weeks to include NSAID's and/or analgesics, use of assistive devices, weight reduction as appropriate and activity modification. The indications for the revision total hip arthroplasty are history of total hip infection and bearing surface wear leading to  symptomatic synovitis and hip arthrofibrosis.  Onset of symptoms was gradual starting 2 years ago with gradually worsening course since that time.  Prior procedures on the right hip include arthroplasty and prostalac prosthesis.  Patient currently rates pain in the right hip at 10 out of 10 with activity.  There is night pain, worsening of pain with activity and weight bearing, trendelenberg gait, pain that interfers with activities of daily living and pain with passive range of motion. Patient has evidence of prostalac prosthesis by imaging studies.  This condition presents safety issues increasing the risk of falls.   There is no current active infection.  Patient Active Problem List   Diagnosis Date Noted  . Anemia 04/16/2015  . Anxiety disorder 03/20/2015  . Acute hyperglycemia 03/20/2015  . Pulse irregularity 03/20/2015  . Dehydration 03/20/2015  . Septic arthritis ([place])   . Prosthetic hip infection ([place])   . Staphylococcus aureus infection   . Enteritis due to Clostridium difficile   . Screen for STD (sexually transmitted disease)   . Acute pain of right hip; hip infection 11/24/2014  . Right hip pain 11/24/2014  . Chronic hyponatremia 11/24/2014  . Seizure disorder ([place]) 08/17/2013  . Digestive disorder 08/17/2013  . HTN (hypertension) 08/17/2013  . HLD (hyperlipidemia) 08/17/2013   Past  Medical History  Diagnosis Date  . Hypertension   . Anemia   . Anxiety   . Depression   . GERD (gastroesophageal reflux disease)   . IBS (irritable bowel syndrome)   . Barrett esophagus   . Vertigo     thinks related to sinus issues  . Renal insufficiency     reports acute kidney injury  . History of pneumonia   . History of bronchitis   . Arthritis   . Seizures ([place])     rare - last one 07/30/14 - was very anemic.  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 Pneumonia 09/27/15  . PTSD (post-traumatic stress disorder)   . Cancer ([place]) 2016     Skin cancer right leg mylenoma  . History of blood transfusion     with a surgery    Past Surgical History  Procedure Laterality Date  . Abdominal hysterectomy    . Hemorrhoid surgery    . Joint replacement Right 2007    hip  . Cyst excision  2011    from throat  . Cesarean section    . Colonoscopy N/A 08/31/2014    Procedure: COLONOSCOPY;  Surgeon: Lucilla Lame, MD;  Location: [facility];  Service: Gastroenterology;  Laterality: N/A;  . Esophagogastroduodenoscopy N/A 08/31/2014    Procedure: ESOPHAGOGASTRODUODENOSCOPY (EGD);  Surgeon: Lucilla Lame, MD;  Location: [facility];  Service: Gastroenterology;  Laterality: N/A;  . Incision and drainage hip Right 11/25/2014    Procedure: IRRIGATION  AND DRAINAGEOF RIGHT HIP WITH PLACEMENT OF ANTIBIOUTIC BEADS;  Surgeon: Mcarthur Rossetti, MD;  Location: [facility];  Service: Orthopedics;  Laterality: Right;  .  Incision and drainage hip Right 11/28/2014    Procedure: Repeat I&D Right Hip;  Surgeon: Mcarthur Rossetti, MD;  Location: [facility];  Service: Orthopedics;  Laterality: Right;  . Total hip revision Right 03/19/2015    Procedure: RIGHT TOTAL HIP ARTHROPLASTY REVISION;  Surgeon: Meredith Pel, MD;  Location: [facility];  Service: Orthopedics;  Laterality: Right;    No prescriptions prior to admission   No Known Allergies  Social History  Substance Use Topics  . Smoking status: Never  Smoker   . Smokeless tobacco: Never Used  . Alcohol Use: No    Family History  Problem Relation Age of Onset  . Alcoholism Father   . Throat cancer Mother       Review of Systems  Constitutional: Positive for malaise/fatigue.  HENT:       Sinus problems  Eyes: Negative.   Respiratory: Negative.   Cardiovascular:       HTN  Gastrointestinal: Positive for abdominal pain and blood in stool.       Poor appetite  Genitourinary: Negative.   Musculoskeletal: Positive for myalgias and joint pain.  Skin: Negative.   Neurological: Negative.   Endo/Heme/Allergies: Bruises/bleeds easily.  Psychiatric/Behavioral: Positive for depression. The patient is nervous/anxious.     Objective:  Physical Exam  Constitutional: She is oriented to person, place, and time. She appears well-developed and well-nourished.  HENT:  Head: Normocephalic and atraumatic.  Eyes: Pupils are equal, round, and reactive to light.  Neck: Normal range of motion. Neck supple.  Cardiovascular: Intact distal pulses.   Respiratory: Effort normal.  Musculoskeletal: She exhibits tenderness.  Wound is soft and nontender.  Foot tap is negative.  There is no swelling or erythema to the right lower extremity.    Neurological: She is alert and oriented to person, place, and time.  Skin: Skin is warm and dry.  Psychiatric: She has a normal mood and affect. Her behavior is normal. Judgment and thought content normal.    Vital signs in last 24 hours: Temp:  [98.8 F (37.1 C)] 98.8 F (37.1 C) (07/14 1309) Pulse Rate:  [81] 81 (07/14 1309) Resp:  [18] 18 (07/14 1309) BP: (157)/(68) 157/68 mmHg (07/14 1309) SpO2:  [100 %] 100 % (07/14 1309) Weight:  [87.119 kg (192 lb 1 oz)] 87.119 kg (192 lb 1 oz) (07/14 1309)   Labs:   Estimated body mass index is 33.59 kg/(m^2) as calculated from the following:   Height as of 09/05/15: 5\' 2"  (1.575 m).   Weight as of 09/05/15: 83.326 kg (183 lb 11.2 oz).  Imaging Review:  CT  scan was accomplished showing adequate bone stock for revision, although we'll be a large amount of scar tissue and heterotopic bone around the joint itself.     Assessment/Plan:  End stage arthritis, right hip(s) with failed previous arthroplasty.  The patient history, physical examination, clinical judgement of the provider and imaging studies are consistent with end stage degenerative joint disease of the right hip(s), previous total hip arthroplasty. Revision total hip arthroplasty is deemed medically necessary. The treatment options including medical management, injection therapy, arthroscopy and arthroplasty were discussed at length. The risks and benefits of total hip arthroplasty were presented and reviewed. The risks due to aseptic loosening, infection, stiffness, dislocation/subluxation,  thromboembolic complications and other imponderables were discussed.  The patient acknowledged the explanation, agreed to proceed with the plan and consent was signed. Patient is being admitted for inpatient treatment for surgery, pain control, PT, OT, prophylactic  antibiotics, VTE prophylaxis, progressive ambulation and ADL's and discharge planning. The patient is planning to be discharged home with home health services

## 2015-11-08 NOTE — Progress Notes (Signed)
   11/08/15 1332  OBSTRUCTIVE SLEEP APNEA  Have you ever been diagnosed with sleep apnea through a sleep study? No  Do you snore loudly (loud enough to be heard through closed doors)?  1  Do you often feel tired, fatigued, or sleepy during the daytime (such as falling asleep during driving or talking to someone)? 0  Has anyone observed you stop breathing during your sleep? 1  Do you have, or are you being treated for high blood pressure? 1  BMI more than 35 kg/m2? 1  Age > 50 (1-yes) 1  Neck circumference greater than:Female 16 inches or larger, Female 17inches or larger? 1 (16.5)  Female Gender (Yes=1) 0  Obstructive Sleep Apnea Score 6  Score 5 or greater  Results sent to PCP

## 2015-11-08 NOTE — Progress Notes (Addendum)
Christina French OR was cancelled in May due to pneumonia.  Christina French reported that her PCP, Dr Clayborn Bigness, has repeated chest x-ray since treatment.  I request chest x-ray, labs and office notes. Christina French has a history of antibiodies in blood, will need another T/S on day of surgery.

## 2015-11-12 NOTE — Progress Notes (Signed)
Anesthesia Chart Review:  Pt is a 60 year old female scheduled for R total hip revision on 11/18/2015 with Frederik Pear, MD.  Surgery was originally scheduled in May but was cancelled due to pneumonia.   PCP is Dr. Clayborn Bigness. Neurologist is Dr. Jennings Books (care everywhere)  PMH includes: HTN, renal insufficiency, seizures (partial symptomatic epilepsy with complex partial seizures), anemia, GERD. Never smoker. BMI 33.5. S/p R total hip revision 03/19/15  Medications include: atenolol, tegretol, protonix.   Preoperative labs reviewed.  - Na 128, Cl 94, consistent with prior results (Na has ranged 124-133 over past year). Will recheck BMET DOS.  - H/H 9.6/29.4 - I notified Juliann Pulse in Dr. Damita Dunnings office of abnormal labs  Chest x-ray 09/27/15: Interval clearing of lingular infiltrate.  EKG 09/05/15: NSR.   Echo 11/27/14:  - Left ventricle: The cavity size was normal. Wall thickness was normal. Systolic function was normal. The estimated ejection fraction was in the range of 60% to 65%. Wall motion was normal; there were no regional wall motion abnormalities. - Aortic valve: Mildly calcified annulus.  If labs acceptable DOS, I anticipate pt can proceed as scheduled.   Willeen Cass, FNP-BC [facility] Short Stay Surgical Center/Anesthesiology Phone: [phone] 11/12/2015 4:38 PM

## 2015-11-18 ENCOUNTER — Inpatient Hospital Stay (HOSPITAL_COMMUNITY): Payer: Medicare Other | Admitting: Emergency Medicine

## 2015-11-18 ENCOUNTER — Encounter (HOSPITAL_COMMUNITY)
Admission: RE | Disposition: A | Discharge status: Skilled Nursing Facility | Payer: Self-pay | Source: Ambulatory Visit | Attending: Orthopedic Surgery

## 2015-11-18 ENCOUNTER — Inpatient Hospital Stay (HOSPITAL_COMMUNITY): Payer: Medicare Other

## 2015-11-18 ENCOUNTER — Encounter (HOSPITAL_COMMUNITY): Payer: Self-pay | Admitting: General Practice

## 2015-11-18 ENCOUNTER — Inpatient Hospital Stay (HOSPITAL_COMMUNITY)
Admission: RE | Admit: 2015-11-18 | Discharge: 2015-11-22 | DRG: 467 | Disposition: A | Discharge status: Skilled Nursing Facility | Payer: Medicare Other | Source: Ambulatory Visit | Attending: Orthopedic Surgery | Admitting: Orthopedic Surgery

## 2015-11-18 DIAGNOSIS — F329 Major depressive disorder, single episode, unspecified: Secondary | ICD-10-CM | POA: Diagnosis present

## 2015-11-18 DIAGNOSIS — K589 Irritable bowel syndrome without diarrhea: Secondary | ICD-10-CM | POA: Diagnosis not present

## 2015-11-18 DIAGNOSIS — M1990 Unspecified osteoarthritis, unspecified site: Secondary | ICD-10-CM | POA: Diagnosis not present

## 2015-11-18 DIAGNOSIS — M6281 Muscle weakness (generalized): Secondary | ICD-10-CM | POA: Diagnosis not present

## 2015-11-18 DIAGNOSIS — F4310 Post-traumatic stress disorder, unspecified: Secondary | ICD-10-CM | POA: Diagnosis present

## 2015-11-18 DIAGNOSIS — Z472 Encounter for removal of internal fixation device: Secondary | ICD-10-CM | POA: Diagnosis not present

## 2015-11-18 DIAGNOSIS — Z4789 Encounter for other orthopedic aftercare: Secondary | ICD-10-CM | POA: Diagnosis not present

## 2015-11-18 DIAGNOSIS — K219 Gastro-esophageal reflux disease without esophagitis: Secondary | ICD-10-CM | POA: Diagnosis present

## 2015-11-18 DIAGNOSIS — M25559 Pain in unspecified hip: Secondary | ICD-10-CM | POA: Diagnosis not present

## 2015-11-18 DIAGNOSIS — T84090A Other mechanical complication of internal right hip prosthesis, initial encounter: Principal | ICD-10-CM | POA: Diagnosis present

## 2015-11-18 DIAGNOSIS — E785 Hyperlipidemia, unspecified: Secondary | ICD-10-CM | POA: Diagnosis not present

## 2015-11-18 DIAGNOSIS — Z419 Encounter for procedure for purposes other than remedying health state, unspecified: Secondary | ICD-10-CM

## 2015-11-18 DIAGNOSIS — Z6838 Body mass index (BMI) 38.0-38.9, adult: Secondary | ICD-10-CM

## 2015-11-18 DIAGNOSIS — Z79899 Other long term (current) drug therapy: Secondary | ICD-10-CM

## 2015-11-18 DIAGNOSIS — T8489XA Other specified complication of internal orthopedic prosthetic devices, implants and grafts, initial encounter: Secondary | ICD-10-CM | POA: Diagnosis not present

## 2015-11-18 DIAGNOSIS — E871 Hypo-osmolality and hyponatremia: Secondary | ICD-10-CM | POA: Diagnosis not present

## 2015-11-18 DIAGNOSIS — I10 Essential (primary) hypertension: Secondary | ICD-10-CM | POA: Diagnosis present

## 2015-11-18 DIAGNOSIS — D649 Anemia, unspecified: Secondary | ICD-10-CM | POA: Diagnosis not present

## 2015-11-18 DIAGNOSIS — S72451A Displaced supracondylar fracture without intracondylar extension of lower end of right femur, initial encounter for closed fracture: Secondary | ICD-10-CM | POA: Diagnosis not present

## 2015-11-18 DIAGNOSIS — G40909 Epilepsy, unspecified, not intractable, without status epilepticus: Secondary | ICD-10-CM | POA: Diagnosis present

## 2015-11-18 DIAGNOSIS — M96661 Fracture of femur following insertion of orthopedic implant, joint prosthesis, or bone plate, right leg: Secondary | ICD-10-CM | POA: Diagnosis not present

## 2015-11-18 DIAGNOSIS — Z6833 Body mass index (BMI) 33.0-33.9, adult: Secondary | ICD-10-CM | POA: Diagnosis not present

## 2015-11-18 DIAGNOSIS — Z96649 Presence of unspecified artificial hip joint: Secondary | ICD-10-CM

## 2015-11-18 DIAGNOSIS — M1611 Unilateral primary osteoarthritis, right hip: Secondary | ICD-10-CM | POA: Diagnosis present

## 2015-11-18 DIAGNOSIS — K2270 Barrett's esophagus without dysplasia: Secondary | ICD-10-CM | POA: Diagnosis not present

## 2015-11-18 DIAGNOSIS — D62 Acute posthemorrhagic anemia: Secondary | ICD-10-CM | POA: Diagnosis not present

## 2015-11-18 DIAGNOSIS — T8451XA Infection and inflammatory reaction due to internal right hip prosthesis, initial encounter: Secondary | ICD-10-CM | POA: Diagnosis not present

## 2015-11-18 DIAGNOSIS — S7291XA Unspecified fracture of right femur, initial encounter for closed fracture: Secondary | ICD-10-CM | POA: Diagnosis not present

## 2015-11-18 DIAGNOSIS — R569 Unspecified convulsions: Secondary | ICD-10-CM | POA: Diagnosis not present

## 2015-11-18 DIAGNOSIS — R2689 Other abnormalities of gait and mobility: Secondary | ICD-10-CM | POA: Diagnosis not present

## 2015-11-18 HISTORY — PX: TOTAL HIP REVISION: SHX763

## 2015-11-18 LAB — BASIC METABOLIC PANEL
ANION GAP: 9 (ref 5–15)
BUN: 10 mg/dL (ref 6–20)
CALCIUM: 9.2 mg/dL (ref 8.9–10.3)
CO2: 23 mmol/L (ref 22–32)
Chloride: 94 mmol/L — ABNORMAL LOW (ref 101–111)
Creatinine, Ser: 0.79 mg/dL (ref 0.44–1.00)
Glucose, Bld: 105 mg/dL — ABNORMAL HIGH (ref 65–99)
Potassium: 4.2 mmol/L (ref 3.5–5.1)
SODIUM: 126 mmol/L — AB (ref 135–145)

## 2015-11-18 LAB — HEMOGLOBIN AND HEMATOCRIT, BLOOD
HCT: 31.4 % — ABNORMAL LOW (ref 36.0–46.0)
Hemoglobin: 10.3 g/dL — ABNORMAL LOW (ref 12.0–15.0)

## 2015-11-18 LAB — PREPARE RBC (CROSSMATCH)

## 2015-11-18 LAB — CBC
HCT: 29.6 % — ABNORMAL LOW (ref 36.0–46.0)
HEMOGLOBIN: 9.8 g/dL — AB (ref 12.0–15.0)
MCH: 26.8 pg (ref 26.0–34.0)
MCHC: 33.1 g/dL (ref 30.0–36.0)
MCV: 80.9 fL (ref 78.0–100.0)
PLATELETS: 146 10*3/uL — AB (ref 150–400)
RBC: 3.66 MIL/uL — AB (ref 3.87–5.11)
RDW: 13.8 % (ref 11.5–15.5)
WBC: 4.4 10*3/uL (ref 4.0–10.5)

## 2015-11-18 SURGERY — TOTAL HIP REVISION
Anesthesia: Spinal | Site: Hip | Laterality: Right

## 2015-11-18 MED ORDER — SUCCINYLCHOLINE CHLORIDE 200 MG/10ML IV SOSY
PREFILLED_SYRINGE | INTRAVENOUS | Status: AC
  Filled 2015-11-18: qty 10

## 2015-11-18 MED ORDER — PROMETHAZINE HCL 25 MG/ML IJ SOLN
INTRAMUSCULAR | Status: AC
  Administered 2015-11-18: 6.25 mg via INTRAVENOUS
  Filled 2015-11-18: qty 1

## 2015-11-18 MED ORDER — ATENOLOL 50 MG PO TABS
50.0000 mg | ORAL_TABLET | Freq: Two times a day (BID) | ORAL | Status: DC
  Administered 2015-11-19 – 2015-11-22 (×7): 50 mg via ORAL
  Filled 2015-11-18 (×7): qty 1

## 2015-11-18 MED ORDER — BISACODYL 5 MG PO TBEC
5.0000 mg | DELAYED_RELEASE_TABLET | Freq: Every day | ORAL | Status: DC | PRN
  Administered 2015-11-19: 5 mg via ORAL
  Filled 2015-11-18: qty 1

## 2015-11-18 MED ORDER — PANTOPRAZOLE SODIUM 40 MG PO TBEC
40.0000 mg | DELAYED_RELEASE_TABLET | Freq: Every day | ORAL | Status: DC
  Administered 2015-11-19 – 2015-11-22 (×4): 40 mg via ORAL
  Filled 2015-11-18 (×4): qty 1

## 2015-11-18 MED ORDER — FLEET ENEMA 7-19 GM/118ML RE ENEM: 1.0000 | ENEMA | Freq: Once | RECTAL | Status: DC | PRN

## 2015-11-18 MED ORDER — DIPHENHYDRAMINE HCL 12.5 MG/5ML PO ELIX: 12.5000 mg | ORAL_SOLUTION | ORAL | Status: DC | PRN

## 2015-11-18 MED ORDER — PROPOFOL 10 MG/ML IV BOLUS
INTRAVENOUS | Status: DC | PRN
Start: 2015-11-18 — End: 2015-11-18
  Administered 2015-11-18: 40 mg via INTRAVENOUS

## 2015-11-18 MED ORDER — ROCURONIUM BROMIDE 100 MG/10ML IV SOLN
INTRAVENOUS | Status: DC | PRN
  Administered 2015-11-18: 50 mg via INTRAVENOUS

## 2015-11-18 MED ORDER — PHENYLEPHRINE 40 MCG/ML (10ML) SYRINGE FOR IV PUSH (FOR BLOOD PRESSURE SUPPORT)
PREFILLED_SYRINGE | INTRAVENOUS | Status: DC | PRN
  Administered 2015-11-18: 40 ug via INTRAVENOUS
  Administered 2015-11-18: 80 ug via INTRAVENOUS
  Administered 2015-11-18: 40 ug via INTRAVENOUS

## 2015-11-18 MED ORDER — SUGAMMADEX SODIUM 200 MG/2ML IV SOLN
INTRAVENOUS | Status: DC | PRN
  Administered 2015-11-18: 200 mg via INTRAVENOUS

## 2015-11-18 MED ORDER — PROPOFOL 500 MG/50ML IV EMUL
INTRAVENOUS | Status: DC | PRN
  Administered 2015-11-18: 75 ug/kg/min via INTRAVENOUS

## 2015-11-18 MED ORDER — METOCLOPRAMIDE HCL 5 MG PO TABS
5.0000 mg | ORAL_TABLET | Freq: Three times a day (TID) | ORAL | Status: DC | PRN
  Administered 2015-11-19: 10 mg via ORAL
  Filled 2015-11-18: qty 2

## 2015-11-18 MED ORDER — SENNOSIDES-DOCUSATE SODIUM 8.6-50 MG PO TABS: 1.0000 | ORAL_TABLET | Freq: Every evening | ORAL | Status: DC | PRN

## 2015-11-18 MED ORDER — BUPIVACAINE-EPINEPHRINE (PF) 0.25% -1:200000 IJ SOLN
INTRAMUSCULAR | Status: AC
  Filled 2015-11-18: qty 60

## 2015-11-18 MED ORDER — METHOCARBAMOL 500 MG PO TABS
500.0000 mg | ORAL_TABLET | Freq: Four times a day (QID) | ORAL | Status: DC | PRN
  Administered 2015-11-19 – 2015-11-21 (×2): 500 mg via ORAL
  Filled 2015-11-18 (×2): qty 1

## 2015-11-18 MED ORDER — PHENYLEPHRINE HCL 10 MG/ML IJ SOLN
INTRAVENOUS | Status: DC | PRN
  Administered 2015-11-18: 80 ug/min via INTRAVENOUS

## 2015-11-18 MED ORDER — SODIUM CHLORIDE 0.9 % IV SOLN
Freq: Once | INTRAVENOUS | Status: AC
  Administered 2015-11-18 (×2): via INTRAVENOUS

## 2015-11-18 MED ORDER — DEXTROSE-NACL 5-0.45 % IV SOLN: INTRAVENOUS | Status: DC

## 2015-11-18 MED ORDER — LIDOCAINE 2% (20 MG/ML) 5 ML SYRINGE
INTRAMUSCULAR | Status: AC
  Filled 2015-11-18: qty 5

## 2015-11-18 MED ORDER — PROPOFOL 10 MG/ML IV BOLUS: INTRAVENOUS | Status: DC | PRN

## 2015-11-18 MED ORDER — OXYCODONE HCL 5 MG PO TABS
5.0000 mg | ORAL_TABLET | Freq: Once | ORAL | Status: AC | PRN
  Administered 2015-11-18: 5 mg via ORAL

## 2015-11-18 MED ORDER — METOCLOPRAMIDE HCL 5 MG/ML IJ SOLN: 5.0000 mg | Freq: Three times a day (TID) | INTRAMUSCULAR | Status: DC | PRN

## 2015-11-18 MED ORDER — BUPIVACAINE HCL (PF) 0.5 % IJ SOLN
INTRAMUSCULAR | Status: DC | PRN
  Administered 2015-11-18: 3.5 mL

## 2015-11-18 MED ORDER — PROPOFOL 10 MG/ML IV BOLUS
INTRAVENOUS | Status: AC
  Filled 2015-11-18: qty 20

## 2015-11-18 MED ORDER — ONDANSETRON HCL 4 MG/2ML IJ SOLN
INTRAMUSCULAR | Status: AC
  Filled 2015-11-18: qty 2

## 2015-11-18 MED ORDER — ALBUMIN HUMAN 5 % IV SOLN
INTRAVENOUS | Status: DC | PRN
  Administered 2015-11-18 (×4): via INTRAVENOUS

## 2015-11-18 MED ORDER — ASPIRIN EC 325 MG PO TBEC
325.0000 mg | DELAYED_RELEASE_TABLET | Freq: Every day | ORAL | Status: DC
  Administered 2015-11-19 – 2015-11-22 (×4): 325 mg via ORAL
  Filled 2015-11-18 (×4): qty 1

## 2015-11-18 MED ORDER — LACTATED RINGERS IV SOLN
INTRAVENOUS | Status: DC
  Administered 2015-11-18: 11:00:00 via INTRAVENOUS

## 2015-11-18 MED ORDER — ONDANSETRON HCL 4 MG PO TABS: 8.0000 mg | ORAL_TABLET | Freq: Two times a day (BID) | ORAL | Status: DC | PRN

## 2015-11-18 MED ORDER — DOCUSATE SODIUM 100 MG PO CAPS
100.0000 mg | ORAL_CAPSULE | Freq: Two times a day (BID) | ORAL | Status: DC
  Administered 2015-11-18 – 2015-11-22 (×8): 100 mg via ORAL
  Filled 2015-11-18 (×8): qty 1

## 2015-11-18 MED ORDER — ALUM & MAG HYDROXIDE-SIMETH 200-200-20 MG/5ML PO SUSP: 30.0000 mL | ORAL | Status: DC | PRN

## 2015-11-18 MED ORDER — PROMETHAZINE HCL 25 MG/ML IJ SOLN
6.2500 mg | INTRAMUSCULAR | Status: DC | PRN
  Administered 2015-11-18: 6.25 mg via INTRAVENOUS

## 2015-11-18 MED ORDER — ONDANSETRON HCL 4 MG/2ML IJ SOLN
4.0000 mg | Freq: Four times a day (QID) | INTRAMUSCULAR | Status: DC | PRN
  Administered 2015-11-18 – 2015-11-20 (×3): 4 mg via INTRAVENOUS
  Filled 2015-11-18 (×3): qty 2

## 2015-11-18 MED ORDER — ALPRAZOLAM 0.5 MG PO TABS
1.0000 mg | ORAL_TABLET | Freq: Three times a day (TID) | ORAL | Status: DC | PRN
  Administered 2015-11-18 – 2015-11-20 (×3): 1 mg via ORAL
  Filled 2015-11-18 (×2): qty 2

## 2015-11-18 MED ORDER — ESCITALOPRAM OXALATE 10 MG PO TABS
20.0000 mg | ORAL_TABLET | Freq: Every day | ORAL | Status: DC
  Administered 2015-11-19 – 2015-11-22 (×4): 20 mg via ORAL
  Filled 2015-11-18 (×4): qty 2

## 2015-11-18 MED ORDER — HYDROMORPHONE HCL 1 MG/ML IJ SOLN
1.0000 mg | INTRAMUSCULAR | Status: DC | PRN
  Administered 2015-11-19 – 2015-11-22 (×5): 1 mg via INTRAVENOUS
  Filled 2015-11-18 (×5): qty 1

## 2015-11-18 MED ORDER — OXYCODONE HCL 5 MG PO TABS
ORAL_TABLET | ORAL | Status: AC
  Administered 2015-11-18: 5 mg via ORAL
  Filled 2015-11-18: qty 1

## 2015-11-18 MED ORDER — OXYCODONE HCL 5 MG PO TABS
5.0000 mg | ORAL_TABLET | ORAL | Status: DC | PRN
  Administered 2015-11-19 (×2): 5 mg via ORAL
  Administered 2015-11-19 – 2015-11-22 (×7): 10 mg via ORAL
  Filled 2015-11-18: qty 2
  Filled 2015-11-18: qty 1
  Filled 2015-11-18 (×6): qty 2
  Filled 2015-11-18: qty 1
  Filled 2015-11-18: qty 2

## 2015-11-18 MED ORDER — PHENOL 1.4 % MT LIQD: 1.0000 | OROMUCOSAL | Status: DC | PRN

## 2015-11-18 MED ORDER — EPHEDRINE SULFATE-NACL 50-0.9 MG/10ML-% IV SOSY
PREFILLED_SYRINGE | INTRAVENOUS | Status: DC | PRN
  Administered 2015-11-18 (×2): 15 mg via INTRAVENOUS
  Administered 2015-11-18: 10 mg via INTRAVENOUS

## 2015-11-18 MED ORDER — CARBAMAZEPINE 200 MG PO TABS
300.0000 mg | ORAL_TABLET | Freq: Two times a day (BID) | ORAL | Status: DC
  Administered 2015-11-18 – 2015-11-22 (×8): 300 mg via ORAL
  Filled 2015-11-18 (×8): qty 2

## 2015-11-18 MED ORDER — ONDANSETRON HCL 4 MG PO TABS
4.0000 mg | ORAL_TABLET | Freq: Four times a day (QID) | ORAL | Status: DC | PRN
  Administered 2015-11-20 (×2): 4 mg via ORAL
  Filled 2015-11-18 (×2): qty 1

## 2015-11-18 MED ORDER — HYOSCYAMINE SULFATE 0.125 MG PO TBDP
0.1250 mg | ORAL_TABLET | Freq: Four times a day (QID) | ORAL | Status: DC | PRN
  Filled 2015-11-18: qty 1

## 2015-11-18 MED ORDER — CEFAZOLIN SODIUM-DEXTROSE 2-4 GM/100ML-% IV SOLN
INTRAVENOUS | Status: AC
  Filled 2015-11-18: qty 100

## 2015-11-18 MED ORDER — ACETAMINOPHEN 325 MG PO TABS
650.0000 mg | ORAL_TABLET | Freq: Four times a day (QID) | ORAL | Status: DC | PRN
  Administered 2015-11-19: 650 mg via ORAL
  Filled 2015-11-18 (×2): qty 2

## 2015-11-18 MED ORDER — MIDAZOLAM HCL 5 MG/5ML IJ SOLN
INTRAMUSCULAR | Status: DC | PRN
  Administered 2015-11-18: 2 mg via INTRAVENOUS

## 2015-11-18 MED ORDER — ACETAMINOPHEN 650 MG RE SUPP: 650.0000 mg | Freq: Four times a day (QID) | RECTAL | Status: DC | PRN

## 2015-11-18 MED ORDER — CEFAZOLIN SODIUM-DEXTROSE 2-4 GM/100ML-% IV SOLN
2.0000 g | INTRAVENOUS | Status: AC
  Administered 2015-11-18 (×2): 2 g via INTRAVENOUS

## 2015-11-18 MED ORDER — TRAZODONE HCL 50 MG PO TABS
50.0000 mg | ORAL_TABLET | Freq: Every day | ORAL | Status: DC
  Administered 2015-11-18 – 2015-11-21 (×4): 50 mg via ORAL
  Filled 2015-11-18 (×4): qty 1

## 2015-11-18 MED ORDER — MENTHOL 3 MG MT LOZG: 1.0000 | LOZENGE | OROMUCOSAL | Status: DC | PRN

## 2015-11-18 MED ORDER — HYDROMORPHONE HCL 1 MG/ML IJ SOLN: 0.2500 mg | INTRAMUSCULAR | Status: DC | PRN

## 2015-11-18 MED ORDER — MIDAZOLAM HCL 2 MG/2ML IJ SOLN
INTRAMUSCULAR | Status: AC
  Filled 2015-11-18: qty 2

## 2015-11-18 MED ORDER — SUCCINYLCHOLINE CHLORIDE 20 MG/ML IJ SOLN
INTRAMUSCULAR | Status: DC | PRN
  Administered 2015-11-18: 120 mg via INTRAVENOUS

## 2015-11-18 MED ORDER — OXYCODONE HCL 5 MG/5ML PO SOLN: 5.0000 mg | Freq: Once | ORAL | Status: AC | PRN

## 2015-11-18 MED ORDER — PHENYLEPHRINE HCL 10 MG/ML IJ SOLN: INTRAMUSCULAR | Status: DC | PRN

## 2015-11-18 MED ORDER — SODIUM CHLORIDE 0.9 % IR SOLN
Status: DC | PRN
  Administered 2015-11-18: 1000 mL

## 2015-11-18 MED ORDER — DEXTROSE 5 % IV SOLN
500.0000 mg | Freq: Four times a day (QID) | INTRAVENOUS | Status: DC | PRN
  Filled 2015-11-18: qty 5

## 2015-11-18 MED ORDER — MIRTAZAPINE 15 MG PO TABS
15.0000 mg | ORAL_TABLET | Freq: Every day | ORAL | Status: DC
  Administered 2015-11-18 – 2015-11-21 (×4): 15 mg via ORAL
  Filled 2015-11-18 (×4): qty 1

## 2015-11-18 MED ORDER — BUPIVACAINE HCL 0.25 % IJ SOLN
INTRAMUSCULAR | Status: DC | PRN
  Administered 2015-11-18: 50 mL

## 2015-11-18 MED ORDER — KCL IN DEXTROSE-NACL 20-5-0.45 MEQ/L-%-% IV SOLN
INTRAVENOUS | Status: DC
  Administered 2015-11-18 – 2015-11-20 (×4): via INTRAVENOUS
  Filled 2015-11-18 (×4): qty 1000

## 2015-11-18 MED ORDER — FENTANYL CITRATE (PF) 250 MCG/5ML IJ SOLN
INTRAMUSCULAR | Status: AC
  Filled 2015-11-18: qty 5

## 2015-11-18 MED ORDER — ALPRAZOLAM 0.25 MG PO TABS
ORAL_TABLET | ORAL | Status: AC
  Administered 2015-11-18: 1 mg via ORAL
  Filled 2015-11-18: qty 4

## 2015-11-18 MED ORDER — BUPIVACAINE LIPOSOME 1.3 % IJ SUSP
20.0000 mL | INTRAMUSCULAR | Status: AC
  Administered 2015-11-18: 20 mL
  Filled 2015-11-18: qty 20

## 2015-11-18 MED ORDER — TRANEXAMIC ACID 1000 MG/10ML IV SOLN
2000.0000 mg | INTRAVENOUS | Status: AC
  Administered 2015-11-18: 2000 mg via TOPICAL
  Filled 2015-11-18 (×2): qty 20

## 2015-11-18 MED ORDER — DEXAMETHASONE SODIUM PHOSPHATE 10 MG/ML IJ SOLN
10.0000 mg | Freq: Once | INTRAMUSCULAR | Status: AC
  Administered 2015-11-19: 10 mg via INTRAVENOUS
  Filled 2015-11-18: qty 1

## 2015-11-18 MED ORDER — ONDANSETRON HCL 4 MG/2ML IJ SOLN
INTRAMUSCULAR | Status: DC | PRN
  Administered 2015-11-18: 4 mg via INTRAVENOUS

## 2015-11-18 MED ORDER — SODIUM CHLORIDE 0.9 % IV SOLN
1000.0000 mg | INTRAVENOUS | Status: AC
  Administered 2015-11-18: 1000 mg via INTRAVENOUS
  Filled 2015-11-18 (×2): qty 10

## 2015-11-18 MED ORDER — LACTATED RINGERS IV SOLN
INTRAVENOUS | Status: DC | PRN
  Administered 2015-11-18 (×3): via INTRAVENOUS

## 2015-11-18 MED ORDER — CHLORHEXIDINE GLUCONATE 4 % EX LIQD: 60.0000 mL | Freq: Once | CUTANEOUS | Status: DC

## 2015-11-18 MED ORDER — FENTANYL CITRATE (PF) 100 MCG/2ML IJ SOLN
INTRAMUSCULAR | Status: DC | PRN
  Administered 2015-11-18: 100 ug via INTRAVENOUS
  Administered 2015-11-18: 50 ug via INTRAVENOUS

## 2015-11-18 SURGICAL SUPPLY — 94 items
BIT DRILL 200X4XGRY 5XPIN (BIT) IMPLANT
BIT DRILL 4.3X289 (BIT) IMPLANT
BIT DRILL 6.4 STEP 90 LEG (DRILL) ×1 IMPLANT
BIT DRILL 6.4MM STEP 90MM LEG (DRILL) ×1
BIT DRILL AO MATTA 3.2MX230M (BIT) IMPLANT
BIT DRL 200X4XGRY 5XPIN (BIT) ×1
BLADE SAW SAG 73X25 THK (BLADE)
BLADE SAW SGTL 73X25 THK (BLADE) IMPLANT
BODY FEM RESTORATION 19 +30 (Hips) ×1 IMPLANT
BRUSH FEMORAL CANAL (MISCELLANEOUS) IMPLANT
COVER SURGICAL LIGHT HANDLE (MISCELLANEOUS) ×3 IMPLANT
DRAPE C-ARM 42X72 X-RAY (DRAPES) IMPLANT
DRAPE C-ARMOR (DRAPES) ×2 IMPLANT
DRAPE ORTHO SPLIT 77X108 STRL (DRAPES) ×3
DRAPE PROXIMA HALF (DRAPES) ×3 IMPLANT
DRAPE SURG ORHT 6 SPLT 77X108 (DRAPES) ×1 IMPLANT
DRAPE U-SHAPE 47X51 STRL (DRAPES) ×3 IMPLANT
DRILL BIT 4.0MM (BIT) ×3
DRILL BIT 4.3X289 (BIT) ×3
DRILL BIT 7/64X5 (BIT) ×3 IMPLANT
DRILL BIT AO MATTA 3.2MX230M (BIT) ×3
DRSG AQUACEL AG ADV 3.5X10 (GAUZE/BANDAGES/DRESSINGS) ×3 IMPLANT
DRSG AQUACEL AG ADV 3.5X14 (GAUZE/BANDAGES/DRESSINGS) ×2 IMPLANT
DURAPREP 26ML APPLICATOR (WOUND CARE) ×3 IMPLANT
ELECT BLADE 4.0 EZ CLEAN MEGAD (MISCELLANEOUS)
ELECT BLADE 6.5 EXT (BLADE) IMPLANT
ELECT REM PT RETURN 9FT ADLT (ELECTROSURGICAL) ×3
ELECTRODE BLDE 4.0 EZ CLN MEGD (MISCELLANEOUS) IMPLANT
ELECTRODE REM PT RTRN 9FT ADLT (ELECTROSURGICAL) ×1 IMPLANT
EVACUATOR 1/8 PVC DRAIN (DRAIN) IMPLANT
GAUZE SPONGE 4X4 12PLY STRL (GAUZE/BANDAGES/DRESSINGS) ×3 IMPLANT
GAUZE XEROFORM 5X9 LF (GAUZE/BANDAGES/DRESSINGS) ×3 IMPLANT
GLOVE BIO SURGEON STRL SZ7.5 (GLOVE) ×3 IMPLANT
GLOVE BIO SURGEON STRL SZ8.5 (GLOVE) ×6 IMPLANT
GLOVE BIOGEL PI IND STRL 8 (GLOVE) ×2 IMPLANT
GLOVE BIOGEL PI IND STRL 9 (GLOVE) ×1 IMPLANT
GLOVE BIOGEL PI INDICATOR 8 (GLOVE) ×4
GLOVE BIOGEL PI INDICATOR 9 (GLOVE) ×2
GOWN STRL REUS W/ TWL LRG LVL3 (GOWN DISPOSABLE) ×1 IMPLANT
GOWN STRL REUS W/ TWL XL LVL3 (GOWN DISPOSABLE) ×2 IMPLANT
GOWN STRL REUS W/TWL LRG LVL3 (GOWN DISPOSABLE) ×3
GOWN STRL REUS W/TWL XL LVL3 (GOWN DISPOSABLE) ×6
GUIDEWIRE GAMMA 800 (WIRE) ×2 IMPLANT
HANDPIECE INTERPULSE COAX TIP (DISPOSABLE)
HEAD FEM LFIT V40 28MM +8 (Head) ×1 IMPLANT
HOOD PEEL AWAY FACE SHEILD DIS (HOOD) ×6 IMPLANT
HOOD PEEL AWAY FLYTE STAYCOOL (MISCELLANEOUS) ×2 IMPLANT
INSERT REST ADM X3 28 28/52 (Orthopedic Implant) ×2 IMPLANT
KIT BASIN OR (CUSTOM PROCEDURE TRAY) ×3 IMPLANT
KIT ROOM TURNOVER OR (KITS) ×3 IMPLANT
LINER MDM 46MM (Orthopedic Implant) ×2 IMPLANT
MANIFOLD NEPTUNE II (INSTRUMENTS) ×3 IMPLANT
NEEDLE 22X1 1/2 (OR ONLY) (NEEDLE) ×3 IMPLANT
NS IRRIG 1000ML POUR BTL (IV SOLUTION) ×6 IMPLANT
PACK TOTAL JOINT (CUSTOM PROCEDURE TRAY) ×3 IMPLANT
PAD ARMBOARD 7.5X6 YLW CONV (MISCELLANEOUS) ×6 IMPLANT
PASSER SUT SWANSON 36MM LOOP (INSTRUMENTS) IMPLANT
PLATE LOCK AXSOS 238MM 10/5H (Plate) ×2 IMPLANT
PLUG CABLE 5.0MM (Cable) ×2 IMPLANT
PRESSURIZER FEMORAL UNIV (MISCELLANEOUS) IMPLANT
REAMER SHAFT BIXCUT (INSTRUMENTS) ×2 IMPLANT
SCREW CORTEX ST MATTA 4.5X70MM (Screw) ×2 IMPLANT
SCREW GAP PLATE REST 6.5X35MM (Screw) ×2 IMPLANT
SCREW LOCK 5.0X10 (Screw) ×2 IMPLANT
SCREW LOCK 5.0X65 (Screw) ×4 IMPLANT
SET HNDPC FAN SPRY TIP SCT (DISPOSABLE) IMPLANT
SHELL HEMI TRID TRITANIUM 58 (Shell) ×2 IMPLANT
SLEEVE BEADED CABLE (Orthopedic Implant) ×4 IMPLANT
SLEEVE CABLE 2MM VT (Orthopedic Implant) ×6 IMPLANT
SLEEVE SURGEON STRL (DRAPES) IMPLANT
SPONGE LAP 18X18 X RAY DECT (DISPOSABLE) ×8 IMPLANT
STAPLER SKIN 35 WIDE (STAPLE) ×4 IMPLANT
STAPLER VISISTAT 35W (STAPLE) ×3 IMPLANT
STEM FEM RESTORATION 15X155 (Stem) ×1 IMPLANT
STEM FEM RESTORATION 15X217 (Stem) ×1 IMPLANT
SUT ETHIBOND 2 V 37 (SUTURE) ×3 IMPLANT
SUT VIC AB 0 CTX 36 (SUTURE) ×3
SUT VIC AB 0 CTX36XBRD ANTBCTR (SUTURE) ×1 IMPLANT
SUT VIC AB 1 CTX 36 (SUTURE) ×3
SUT VIC AB 1 CTX36XBRD ANBCTR (SUTURE) ×1 IMPLANT
SUT VIC AB 2-0 CT1 27 (SUTURE) ×3
SUT VIC AB 2-0 CT1 TAPERPNT 27 (SUTURE) ×1 IMPLANT
SUT VIC AB 3-0 CT1 27 (SUTURE) ×3
SUT VIC AB 3-0 CT1 TAPERPNT 27 (SUTURE) ×1 IMPLANT
SWAB COLLECTION DEVICE MRSA (MISCELLANEOUS) ×3 IMPLANT
SWAB CULTURE ESWAB REG 1ML (MISCELLANEOUS) ×2 IMPLANT
SYR 20ML ECCENTRIC (SYRINGE) ×3 IMPLANT
SYR CONTROL 10ML LL (SYRINGE) ×3 IMPLANT
TOWEL OR 17X24 6PK STRL BLUE (TOWEL DISPOSABLE) ×3 IMPLANT
TOWEL OR 17X26 10 PK STRL BLUE (TOWEL DISPOSABLE) ×3 IMPLANT
TOWER CARTRIDGE SMART MIX (DISPOSABLE) IMPLANT
TRAY FOLEY CATH 14FR (SET/KITS/TRAYS/PACK) IMPLANT
TUBE ANAEROBIC SPECIMEN COL (MISCELLANEOUS) ×3 IMPLANT
WATER STERILE IRR 1000ML POUR (IV SOLUTION) ×9 IMPLANT

## 2015-11-18 NOTE — Anesthesia Procedure Notes (Signed)
Spinal  Patient location during procedure: OR Staffing Anesthesiologist: Benson Porcaro Performed: anesthesiologist  Preanesthetic Checklist Completed: patient identified, site marked, surgical consent, pre-op evaluation, timeout performed, IV checked, risks and benefits discussed and monitors and equipment checked Spinal Block Patient position: sitting Prep: ChloraPrep Patient monitoring: heart rate, continuous pulse ox and blood pressure Location: L3-4 Injection technique: single-shot Needle Needle type: Quincke  Needle gauge: 22 G Needle length: 9 cm Assessment Sensory level: T6 Additional Notes Expiration date of kit checked and confirmed. Patient tolerated procedure well, without complications.

## 2015-11-18 NOTE — Op Note (Signed)
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   Postoperative diagnosis: Same  Procedure: Revision right total hip arthroplasty with removal of Prostalac prosthesis and cement and placement of a Stryker restoration plasma coated stem 15 mm x 2 17 with a 70 mm x 19 mm body and a +828 mm head going into a 46 M.D. and polyethylene bearing the shell was a 56 mm titanium shell with multiple holes single dome screw was placed and an MDM metal liner. For the intraoperative supracondylar fracture we placed a Stryker lateral distal femoral locking plate with 9 holes using 4 Dall-Miles cables 1 unicortical screw and distally 2 locking screws and one nonlocking screw as well as another cable at the fracture site  Surgeon: Kathalene Frames. Mayer Camel M.D.  Assistant: Kerry Hough. Barton Dubois  (present throughout entire procedure and necessary for timely completion of the procedure)  Estimated blood loss: 2 L cc  Fluid replacement: 2500 cc of crystalloid, 4 units of packed red blood cells  Urine output 1 L  Complications: Intraoperative distal femur fracture starting 1 cm below the original prosthesis and going all the way to the supracondylar region of the right knee.  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 After completing IV antibiotics and oral antibiotics are inflammatory markers had gone to normal repeat aspiration yielded no fluid and there is no evidence of active infection. She's been off antibiotics for more than 3 months. Her original surgery 2 months ago was canceled secondary to pneumonia.  Coming up on this operation her sodium which is never been over 130 was 126 and her chronic anemia was at 9.6. This is a high-risk revision and the patient knows this well. Risks and benefits have been discussed and questions answered.  Procedure: Patient was identified by arm band and receive preoperative IV antibiotics in the holding area. She was taken to operating room for the appropriate anesthetic monitors were attached and spinal anesthesia was induced. She was then placed on a flat Jackson table rolled into the left lateral decubitus position and fixed there with a Stulberg Mark 2 pelvic clamp. She received a preoperative IV trans-Amick acid which was repeated 3 hours into the procedure. The right lower extremity was prepped and draped in usual sterile fashion from the ankle to the hemipelvis and a timeout procedure was performed. We began the operation by making a standard posterior lateral approach re-creating the old incision which was 20 cm in length through the skin and subcutaneous tissue down to the level of the IT band. We then peeled scar tissue off of the intertrochanteric crest exposing the Prostalac prosthesis. Scar tissue was removed from around the femoral and acetabular components and the femoral component was noted be loose and easily removed by hand. The acetabular component had a layer of cement which was removed with a half inch wide osteotome and then the polyethylene component came out without difficulty the cement was further fragmented and removed without difficulty there was an intact clear bone circumferentially and was felt should be able to be reconstructed with a hemispherical cup. We sequentially reamed up to 55 mm  obtaining good coverage in all quadrants. We discussed selected a 56 mm titanium cup with multiple holes and hammered into place in 45 of abduction and about 20 of anteversion. For supplemental fixation we did place a single dome screw that had good bite. We then  placed a temporary metal liner for the MDM trial. We then directed our attention to the femur which is heavily scarred in and had little if any internal rotation we spent an extensive amount of time removing heterotopic bone from around the proximal femur as well as proximal cement. Under C-arm imaging control we then drilled through the bone that was just distal to the Prostalac prosthesis, pedestal, with a 6 mm drill followed by 8 and 10 mm straight reamers. We then reamed up to a 15 mm x 1 55 conical stem and under C-arm image control confirmed that the drill was inside the femur. We then hammered into place a 15 mm x 1 55 stem and overreamed up to a 19 mm x 40 mm trial body. This is placed on the stem and while we were preparing to do our trial with a simple internal rotation to about 60 the patient sustained a distal femur fracture by C-arm imaging. At this point a 155 mm stem was removed and we prepared for a long bowed 217 mm plasma stem. A guidewire was placed in the femur ball-tipped pot. We then overreamed up to 16 mm full depth and 16.5 mm partial depth. We then selected a 15 mm x 217 mm bowed plasma coated Stryker restoration stem and this was hammered past the fracture site into the distal femur although there is still no rotational stability the femur itself was stable to axial loading. The stem had been inserted to the depth requiring a 70 mm x 19 mm body. And a trial body was placed on the femoral stem. Leg length was noted to be about 2 cm longer with a trial body in place which made up for her 2 cm of shortening from the Prostalac prosthesis. The trial body was removed and a real 70 mm x 19 mm body was hammered into place in 15 of anteversion and locked onto the stem. We then selected a metal liner for the 56 mm shell which was then hammered into place followed by a 46 mm MDM ball with a +8 28 mm MDM metal head that was placed on the stem and the hip was reduced. We then direct our attention to  the distal femur fracture and extended our original incision all the way down to the lateral femoral condyle of the knee cutting through the skin subcutaneous tissue and IT band. The vastus lateralis was split exposing the fracture site which was reduced and held with a single Dall-Miles cable and the femoral stem was easily seen going into the supracondylar region prior to the reduction. We then selected a 9 hole Stryker lateral distal femoral locking plate applied it to the lateral femur centered in under C-arm and placed a single center hole lag screw. Proximally we then set about placing 4 Dall-Miles cables with good tension and a single unicortical screw. We then placed 2 more distal locking screws in the femoral condyles completing the reconstruction and C-arm images were taken confirming good position. At this point the knee was irrigated out normal saline solution the patient was redosed the trans-Amick acid and a Trans Am echo acid soaked sponge was used to continually throughout the procedure. The vastus lateralis and  lateral capsule of the knee were closed with running #1 Vicryl suture the IT band with running #1 Vicryl suture all the way up to the IT band over the hip. Subcutaneous tissues closed with 0 and 2-0 undyed Vicryl suture followed by staples and 2 long cardiac Aquacel dressings. Each layer was irrigated with normal saline solution prior to closure and there was no active bleeding. At this point the patient was unclamped rolled supine awakened extubated and taken to the recovery without difficulty.    We then closed the IT band with running #1 Vicryl suture, the subcutaneous tissue with 0 and 2-0 undyed Vicryl suture, the skin was closed with running interlocking 3-0 nylon suture. A dressing of Mepilex was then applied, the patient was unclamped a rolled supine awakened extubated and taken to the recovery without difficulty.

## 2015-11-18 NOTE — Transfer of Care (Signed)
Immediate Anesthesia Transfer of Care Note  Patient: Christina French  Procedure(s) Performed: Procedure(s): TOTAL HIP REVISION (Right)  Patient Location: PACU  Anesthesia Type:GA combined with regional for post-op pain  Level of Consciousness: awake and alert   Airway & Oxygen Therapy: Patient Spontanous Breathing and Patient connected to nasal cannula oxygen  Post-op Assessment: Report given to RN and Post -op Vital signs reviewed and stable  Post vital signs: Reviewed and stable  Last Vitals:  Vitals:   11/18/15 1104 11/18/15 1900  BP: 124/79 113/74  Pulse: 61 (!) 101  Resp: 18 10  Temp: 36.9 C 36.7 C    Last Pain:  Vitals:   11/18/15 1104  TempSrc: Oral      Patients Stated Pain Goal: 2 ([id number])  Complications: No apparent anesthesia complications

## 2015-11-18 NOTE — OR Nursing (Signed)
Ms. Polidoro delivered to [phone].  Denies pain, easily arousable, still intermittently c/o nausea but improved from earlier.  Can bend her left knee off the bed.  Scant bloody drainage on her right hip dressing with palpable 2+ pulse distally in her right foot.  VSS on arrival to 5N24.  Pt resting comfortably.

## 2015-11-18 NOTE — Progress Notes (Signed)
Dr. Jillyn Hidden made aware of patient's sodium and abnormal labs.

## 2015-11-18 NOTE — Anesthesia Procedure Notes (Signed)
Procedure Name: Intubation Date/Time: 11/18/2015 4:25 PM Performed by: Lance Coon Pre-anesthesia Checklist: Patient identified, Emergency Drugs available, Suction available, Timeout performed and Patient being monitored Patient Re-evaluated:Patient Re-evaluated prior to inductionOxygen Delivery Method: Circle system utilized Preoxygenation: Pre-oxygenation with 100% oxygen Intubation Type: IV induction Ventilation: Mask ventilation without difficulty Laryngoscope Size: Glidescope and 3 Grade View: Grade I Tube type: Oral Tube size: 7.5 mm Number of attempts: 1 Airway Equipment and Method: Stylet Secured at: 21 cm Tube secured with: Tape Dental Injury: Teeth and Oropharynx as per pre-operative assessment

## 2015-11-18 NOTE — Anesthesia Preprocedure Evaluation (Addendum)
Anesthesia Evaluation  Patient identified by MRN, date of birth, ID band Patient awake    Reviewed: Allergy & Precautions, NPO status , Patient's Chart, lab work & pertinent test results  History of Anesthesia Complications Negative for: history of anesthetic complications  Airway Mallampati: II  TM Distance: >3 FB Neck ROM: Full    Dental  (+) Dental Advisory Given   Pulmonary neg pulmonary ROS,    breath sounds clear to auscultation       Cardiovascular hypertension, Pt. on medications and Pt. on home beta blockers (-) angina Rhythm:Regular Rate:Normal  8/16 ECHO: EF 60-65%, valves OK   Neuro/Psych Seizures -, Well Controlled,  Anxiety Depression    GI/Hepatic Neg liver ROS, GERD  Medicated and Poorly Controlled,  Endo/Other  Morbid obesity  Renal/GU Renal InsufficiencyRenal disease     Musculoskeletal  (+) Arthritis , Osteoarthritis,    Abdominal (+) + obese,   Peds  Hematology negative hematology ROS (+) anemia ,   Anesthesia Other Findings Chronic hyponatremia, Na today is 126, usual is 124-130 and I have not seen a number higher than 131 for the last 5 years.. Totally asymptomatic  Reproductive/Obstetrics                            Anesthesia Physical  Anesthesia Plan  ASA: III  Anesthesia Plan: Spinal   Post-op Pain Management:    Induction: Intravenous  Airway Management Planned: Simple Face Mask  Additional Equipment:   Intra-op Plan:   Post-operative Plan: Extubation in OR  Informed Consent: I have reviewed the patients History and Physical, chart, labs and discussed the procedure including the risks, benefits and alternatives for the proposed anesthesia with the patient or authorized representative who has indicated his/her understanding and acceptance.   Dental advisory given  Plan Discussed with: CRNA, Surgeon and Anesthesiologist  Anesthesia Plan Comments:         Anesthesia Quick Evaluation

## 2015-11-18 NOTE — Anesthesia Postprocedure Evaluation (Signed)
Anesthesia Post Note  Patient: Christina French  Procedure(s) Performed: Procedure(s) (LRB): TOTAL HIP REVISION (Right)  Patient location during evaluation: PACU Anesthesia Type: General Level of consciousness: awake Pain management: pain level controlled Vital Signs Assessment: post-procedure vital signs reviewed and stable Respiratory status: spontaneous breathing Anesthetic complications: no    Last Vitals:  Vitals:   11/18/15 1104 11/18/15 1900  BP: 124/79 113/74  Pulse: 61 (!) 101  Resp: 18 10  Temp: 36.9 C 36.7 C    Last Pain:  Vitals:   11/18/15 1104  TempSrc: Oral                 Pam Vanalstine,JAMES TERRILL

## 2015-11-19 ENCOUNTER — Encounter (HOSPITAL_COMMUNITY): Payer: Self-pay | Admitting: Orthopedic Surgery

## 2015-11-19 LAB — POCT I-STAT 7, (LYTES, BLD GAS, ICA,H+H)
ACID-BASE DEFICIT: 7 mmol/L — AB (ref 0.0–2.0)
BICARBONATE: 19 meq/L — AB (ref 20.0–24.0)
CALCIUM ION: 1.09 mmol/L — AB (ref 1.13–1.30)
HCT: 23 % — ABNORMAL LOW (ref 36.0–46.0)
HEMOGLOBIN: 7.8 g/dL — AB (ref 12.0–15.0)
O2 SAT: 100 %
PH ART: 7.324 — AB (ref 7.350–7.450)
Potassium: 4.1 mmol/L (ref 3.5–5.1)
SODIUM: 133 mmol/L — AB (ref 135–145)
TCO2: 20 mmol/L (ref 0–100)
pCO2 arterial: 36.1 mmHg (ref 35.0–45.0)
pO2, Arterial: 220 mmHg — ABNORMAL HIGH (ref 80.0–100.0)

## 2015-11-19 LAB — TYPE AND SCREEN
ABO/RH(D): O NEG
ANTIBODY SCREEN: NEGATIVE
DONOR AG TYPE: NEGATIVE
DONOR AG TYPE: NEGATIVE
DONOR AG TYPE: NEGATIVE
Donor AG Type: NEGATIVE
Donor AG Type: NEGATIVE
UNIT DIVISION: 0
UNIT DIVISION: 0
Unit division: 0
Unit division: 0
Unit division: 0

## 2015-11-19 LAB — CBC
HCT: 27.7 % — ABNORMAL LOW (ref 36.0–46.0)
HEMOGLOBIN: 9.5 g/dL — AB (ref 12.0–15.0)
MCH: 28.3 pg (ref 26.0–34.0)
MCHC: 34.3 g/dL (ref 30.0–36.0)
MCV: 82.4 fL (ref 78.0–100.0)
PLATELETS: 95 10*3/uL — AB (ref 150–400)
RBC: 3.36 MIL/uL — AB (ref 3.87–5.11)
RDW: 14.2 % (ref 11.5–15.5)
WBC: 10.3 10*3/uL (ref 4.0–10.5)

## 2015-11-19 NOTE — Evaluation (Signed)
Physical Therapy Evaluation Patient Details Name: Christina French MRN: [id number] DOB: [birth year] Today's Date: 11/19/2015   History of Present Illness  Pt is a 60 y/o female s/p R total hip revision secondary to infection; posterior hip precautions and PWB 50%. Original THA was performed in 2007. PMH including but not limited to anemia, HTN, vertigo, seizures, and PTSD.   Clinical Impression  Pt presented supine in bed with HOB elevated, awake and willing to participate in therapy session. Pt reported significant pain in R hip and R thigh throughout session, which limited her ability to participate in gait training or to transfer into the recliner. Pt would continue to benefit from skilled physical therapy services at this time while admitted and after d/c to address her below listed limitations in order to improve safety and independence with functional mobility.      Follow Up Recommendations SNF    Equipment Recommendations  None recommended by PT    Recommendations for Other Services       Precautions / Restrictions Precautions Precautions: Posterior Hip;Fall Precaution Booklet Issued: Yes (comment) Precaution Comments: PT reviewed posterior hip precautions and WB status with pt. Restrictions Weight Bearing Restrictions: Yes RLE Weight Bearing: Partial weight bearing RLE Partial Weight Bearing Percentage or Pounds: 50%      Mobility  Bed Mobility Overal bed mobility: Needs Assistance Bed Mobility: Supine to Sit;Sit to Supine     Supine to sit: Mod assist;HOB elevated (at bilateral LEs) Sit to supine: Mod assist (at bilateral LEs)   General bed mobility comments: Pt required increased time and VC'ing for technique as well as use of bedrails  Transfers Overall transfer level: Needs assistance Equipment used: Rolling walker (2 wheeled) Transfers: Sit to/from Stand Sit to Stand: Min guard (for safety and stabilization of RW)         General transfer comment: Pt  required increased time and VC'ing for bilateral hand positioning and technique  Ambulation/Gait                Stairs            Wheelchair Mobility    Modified Rankin (Stroke Patients Only)       Balance Overall balance assessment: Needs assistance Sitting-balance support: Feet supported;Bilateral upper extremity supported Sitting balance-Leahy Scale: Poor     Standing balance support: Bilateral upper extremity supported Standing balance-Leahy Scale: Poor                               Pertinent Vitals/Pain Pain Assessment: 0-10 Pain Score: 8  Pain Location: R hip and R thing Pain Descriptors / Indicators: Discomfort;Operative site guarding Pain Intervention(s): Limited activity within patient's tolerance;Monitored during session;Repositioned    Home Living Family/patient expects to be discharged to:: Skilled nursing facility                      Prior Function Level of Independence: Independent with assistive device(s)         Comments: Pt stated that she was using a w/c for functional mobility prior to admission for approximately one year.     Hand Dominance        Extremity/Trunk Assessment   Upper Extremity Assessment: Defer to OT evaluation           Lower Extremity Assessment: RLE deficits/detail RLE Deficits / Details: Pt with decreased strength and ROM limitations secondary to post-op. Pt's sensation is grossly intact.  Communication   Communication: No difficulties  Cognition Arousal/Alertness: Awake/alert Behavior During Therapy: WFL for tasks assessed/performed Overall Cognitive Status: Within Functional Limits for tasks assessed                      General Comments      Exercises Total Joint Exercises Ankle Circles/Pumps: AROM;Strengthening;Right;10 reps;Seated Long Arc Quad: AAROM;Strengthening;Right;10 reps      Assessment/Plan    PT Assessment Patient needs continued PT  services  PT Diagnosis Difficulty walking   PT Problem List Decreased strength;Decreased range of motion;Decreased activity tolerance;Decreased balance;Decreased mobility;Decreased coordination;Pain  PT Treatment Interventions DME instruction;Gait training;Stair training;Therapeutic activities;Functional mobility training;Therapeutic exercise;Balance training;Neuromuscular re-education;Patient/family education   PT Goals (Current goals can be found in the Care Plan section) Acute Rehab PT Goals Patient Stated Goal: return home following stay at SNF PT Goal Formulation: With patient Time For Goal Achievement: 11/26/15 Potential to Achieve Goals: Good    Frequency 7X/week   Barriers to discharge        Co-evaluation               End of Session Equipment Utilized During Treatment: Gait belt Activity Tolerance: Patient limited by pain Patient left: in bed;with call bell/phone within reach;with nursing/sitter in room Nurse Communication: Mobility status         Time: [id number] PT Time Calculation (min) (ACUTE ONLY): 28 min   Charges:   PT Evaluation $PT Eval Moderate Complexity: 1 Procedure PT Treatments $Therapeutic Activity: 8-22 mins   PT G CodesClearnce Sorrel Collins [place] 11/19/2015, 11:01 AM Sherie Don, PT, DPT [phone]

## 2015-11-19 NOTE — Care Management Important Message (Signed)
Important Message  Patient Details  Name: Christina French MRN: [id number] Date of Birth: Dec 07, 1955   Medicare Important Message Given:  Yes    Loann Quill 11/19/2015, 9:47 AM

## 2015-11-19 NOTE — NC FL2 (Signed)
[place] LEVEL OF CARE SCREENING TOOL     IDENTIFICATION  Patient Name: Christina French Birthdate: 1955/11/04 Sex: female Admission Date (Current Location): 11/18/2015  [facility] and [place] Number:  Herbalist and Address:  [facility] [place], [place] [address] [place], [place] [address]      Provider Number: [id number]  Attending Physician Name and Address:  Frederik Pear, MD  Relative Name and Phone Number:       Current Level of Care: Hospital Recommended Level of Care: [place] Prior Approval Number:    Date Approved/Denied:   PASRR Number: [id number]  Discharge Plan: SNF    Current Diagnoses: Patient Active Problem List   Diagnosis Date Noted  . 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 Anemia 04/16/2015  . Anxiety disorder 03/20/2015  . Acute hyperglycemia 03/20/2015  . Pulse irregularity 03/20/2015  . Dehydration 03/20/2015  . Septic arthritis ([place])   . Prosthetic hip infection ([place])   . Staphylococcus aureus infection   . Enteritis due to Clostridium difficile   . Screen for STD (sexually transmitted disease)   . Acute pain of right hip; hip infection 11/24/2014  . Right hip pain 11/24/2014  . Chronic hyponatremia 11/24/2014  . Seizure disorder ([place]) 08/17/2013  . Digestive disorder 08/17/2013  . HTN (hypertension) 08/17/2013  . HLD (hyperlipidemia) 08/17/2013    Orientation RESPIRATION BLADDER Height & Weight     Self, Time, Situation, Place    Continent Weight: 203 lb 11.3 oz (92.4 kg) Height:  5\' 1"  (154.9 cm)  BEHAVIORAL SYMPTOMS/MOOD NEUROLOGICAL BOWEL NUTRITION STATUS      Continent    AMBULATORY STATUS COMMUNICATION OF NEEDS Skin   Extensive Assist                           Personal Care Assistance Level of Assistance  Dressing, Bathing     Dressing Assistance: Limited assistance     Functional Limitations Info             SPECIAL CARE FACTORS FREQUENCY  OT (By licensed  OT), PT (By licensed PT)     PT Frequency: daily OT Frequency: daily            Contractures Contractures Info: Not present    Additional Factors Info                  Current Medications (11/19/2015):  This is the current hospital active medication list Current Facility-Administered Medications  Medication Dose Route Frequency Provider Last Rate Last Dose  . acetaminophen (TYLENOL) tablet 650 mg  650 mg Oral Q6H PRN Leighton Parody, PA-C   650 mg at 11/19/15 0831   Or  . acetaminophen (TYLENOL) suppository 650 mg  650 mg Rectal Q6H PRN Leighton Parody, PA-C      . ALPRAZolam Duanne Moron) tablet 1 mg  1 mg Oral TID PRN Leighton Parody, PA-C   1 mg at 11/19/15 0843  . alum & mag hydroxide-simeth (MAALOX/MYLANTA) 200-200-20 MG/5ML suspension 30 mL  30 mL Oral Q4H PRN Leighton Parody, PA-C      . aspirin EC tablet 325 mg  325 mg Oral Q breakfast Leighton Parody, PA-C   325 mg at 11/19/15 0831  . atenolol (TENORMIN) tablet 50 mg  50 mg Oral BID Leighton Parody, PA-C   50 mg at 11/19/15 0830  . bisacodyl (DULCOLAX) EC tablet 5 mg  5 mg Oral Daily PRN Leighton Parody, PA-C   5 mg at 11/19/15 [id number]  . carbamazepine (TEGRETOL) tablet 300 mg  300 mg Oral BID Leighton Parody, PA-C   300 mg at 11/19/15 0830  . dextrose 5 % and 0.45 % NaCl with KCl 20 mEq/L infusion   Intravenous Continuous Leighton Parody, PA-C 125 mL/hr at 11/18/15 2350    . diphenhydrAMINE (BENADRYL) 12.5 MG/5ML elixir 12.5-25 mg  12.5-25 mg Oral Q4H PRN Leighton Parody, PA-C      . docusate sodium (COLACE) capsule 100 mg  100 mg Oral BID Leighton Parody, PA-C   100 mg at 11/19/15 [id number]  . escitalopram (LEXAPRO) tablet 20 mg  20 mg Oral Daily Leighton Parody, PA-C   20 mg at 11/19/15 [id number]  . HYDROmorphone (DILAUDID) injection 1 mg  1 mg Intravenous Q2H PRN Leighton Parody, PA-C   1 mg at 11/19/15 0446  . hyoscyamine (ANASPAZ) disintergrating tablet 0.125 mg  0.125 mg Oral QID PRN Leighton Parody, PA-C      . lactated ringers  infusion   Intravenous Continuous Jillyn Hidden, MD 10 mL/hr at 11/18/15 1103    . menthol-cetylpyridinium (CEPACOL) lozenge 3 mg  1 lozenge Oral PRN Leighton Parody, PA-C       Or  . phenol (CHLORASEPTIC) mouth spray 1 spray  1 spray Mouth/Throat PRN Leighton Parody, PA-C      . methocarbamol (ROBAXIN) tablet 500 mg  500 mg Oral Q6H PRN Leighton Parody, PA-C   500 mg at 11/19/15 0831   Or  . methocarbamol (ROBAXIN) 500 mg in dextrose 5 % 50 mL IVPB  500 mg Intravenous Q6H PRN Leighton Parody, PA-C      . metoCLOPramide (REGLAN) tablet 5-10 mg  5-10 mg Oral Q8H PRN Leighton Parody, PA-C   10 mg at 11/19/15 0831   Or  . metoCLOPramide (REGLAN) injection 5-10 mg  5-10 mg Intravenous Q8H PRN Leighton Parody, PA-C      . mirtazapine (REMERON) tablet 15 mg  15 mg Oral QHS Leighton Parody, PA-C   15 mg at 11/18/15 2352  . ondansetron (ZOFRAN) tablet 4 mg  4 mg Oral Q6H PRN Leighton Parody, PA-C       Or  . ondansetron [facility]) injection 4 mg  4 mg Intravenous Q6H PRN Leighton Parody, PA-C   4 mg at 11/19/15 0445  . oxyCODONE (Oxy IR/ROXICODONE) immediate release tablet 5-10 mg  5-10 mg Oral Q3H PRN Leighton Parody, PA-C   5 mg at 11/19/15 0843  . pantoprazole (PROTONIX) EC tablet 40 mg  40 mg Oral Daily Leighton Parody, PA-C   40 mg at 11/19/15 0830  . senna-docusate (Senokot-S) tablet 1 tablet  1 tablet Oral QHS PRN Leighton Parody, PA-C      . sodium phosphate (FLEET) 7-19 GM/118ML enema 1 enema  1 enema Rectal Once PRN Leighton Parody, PA-C      . traZODone (DESYREL) tablet 50 mg  50 mg Oral QHS Leighton Parody, PA-C   50 mg at 11/18/15 2348     Discharge Medications: Please see discharge summary for a list of discharge medications.  Relevant Imaging Results:  Relevant Lab Results:   Additional Information [id number]  Dulcy Fanny, LCSW

## 2015-11-19 NOTE — Progress Notes (Signed)
Foley catheter removed, patient is due to void, she is educated to call for assistance to urinate if needed with verbalization of understanding

## 2015-11-19 NOTE — Progress Notes (Signed)
Patient ID: Christina French, female   DOB: Oct 03, 1955, 60 y.o.   MRN: [id number] PATIENT ID: Christina French  MRN: [id number]  DOB/AGE:  1955-11-07 / 60 y.o.  1 Day Post-Op Procedure(s) (LRB): TOTAL HIP REVISION (Right)    PROGRESS NOTE Subjective: Patient is alert, oriented, x1 Nausea, no Vomiting, yes passing gas, . Taking PO well. Denies SOB, Chest or Calf Pain. Using Incentive Spirometer, PAS in place. Ambulate pwb 50% Patient reports pain as  5/10  .    Objective: Vital signs in last 24 hours: Vitals:   11/18/15 2208 11/19/15 0005 11/19/15 0130 11/19/15 0425  BP: (!) 109/59 (!) 88/52 116/60 (!) 106/54  Pulse: 87 80 71 91  Resp: 18 16  18   Temp: 98 F (36.7 C) 98 F (36.7 C)  98.1 F (36.7 C)  TempSrc: Oral Oral  Oral  SpO2: 100% 100% 99% 99%  Weight: 92.4 kg (203 lb 11.3 oz)     Height:          Intake/Output from previous day: I/O last 3 completed shifts: In: 7038 [I.V.:4550; Blood:1738; IV Piggyback:750] Out: [id number] [Urine:1500; Blood:2450]   Intake/Output this shift: No intake/output data recorded.   LABORATORY DATA:  Recent Labs  11/18/15 1052 11/18/15 2221  WBC 4.4  --   HGB 9.8* 10.3*  HCT 29.6* 31.4*  PLT 146*  --   NA 126*  --   K 4.2  --   CL 94*  --   CO2 23  --   BUN 10  --   CREATININE 0.79  --   GLUCOSE 105*  --   CALCIUM 9.2  --     Examination: Neurologically intact ABD soft Neurovascular intact Sensation intact distally Intact pulses distally Dorsiflexion/Plantar flexion intact Incision: scant drainage No cellulitis present Compartment soft} XR AP&Lat of hip shows well placed\fixed THA  Assessment:   1 Day Post-Op Procedure(s) (LRB): TOTAL HIP REVISION (Right) ADDITIONAL DIAGNOSIS:  Expected Acute Blood Loss Anemia, Hypertension, anxiety,anemia, chronic hyponatremia  Plan: PT/OT WBAT, THA  DVT Prophylaxis: SCDx72 hrs, ASA 325 mg BID x 2 weeks  DISCHARGE PLAN: Skilled Nursing Facility/Rehab, [facility] in [place]  DISCHARGE  NEEDS: HHPT, Walker and 3-in-1 comode seat

## 2015-11-19 NOTE — Plan of Care (Signed)
Problem: Pain Managment: Goal: General experience of comfort will improve Outcome: Progressing Was medicated once for pain with full relief

## 2015-11-19 NOTE — Plan of Care (Signed)
Problem: Physical Regulation: Goal: Ability to maintain clinical measurements within normal limits will improve Outcome: Progressing No acute distress noted  Problem: Skin Integrity: Goal: Risk for impaired skin integrity will decrease Outcome: Progressing Right hip incisions with acquacel clean and intact with small old serosanguinous drainage  Problem: Tissue Perfusion: Goal: Risk factors for ineffective tissue perfusion will decrease Outcome: Progressing Patient BP was low earlier this shift, BP rechecked with improvement, see chart

## 2015-11-20 LAB — CBC
HCT: 23.2 % — ABNORMAL LOW (ref 36.0–46.0)
Hemoglobin: 7.8 g/dL — ABNORMAL LOW (ref 12.0–15.0)
MCH: 27.7 pg (ref 26.0–34.0)
MCHC: 33.6 g/dL (ref 30.0–36.0)
MCV: 82.3 fL (ref 78.0–100.0)
PLATELETS: 94 10*3/uL — AB (ref 150–400)
RBC: 2.82 MIL/uL — ABNORMAL LOW (ref 3.87–5.11)
RDW: 13.9 % (ref 11.5–15.5)
WBC: 8.9 10*3/uL (ref 4.0–10.5)

## 2015-11-20 MED ORDER — ASPIRIN EC 325 MG PO TBEC: 325.0000 mg | DELAYED_RELEASE_TABLET | Freq: Two times a day (BID) | ORAL | 0 refills | Status: DC

## 2015-11-20 MED ORDER — OXYCODONE-ACETAMINOPHEN 5-325 MG PO TABS: 1.0000 | ORAL_TABLET | ORAL | 0 refills | Status: DC | PRN

## 2015-11-20 NOTE — Progress Notes (Signed)
PATIENT ID: Christina French  MRN: [id number]  DOB/AGE:  [birth year] / 60 y.o.  2 Days Post-Op Procedure(s) (LRB): TOTAL HIP REVISION (Right)    PROGRESS NOTE Subjective: Patient is alert, oriented, no Nausea, no Vomiting, yes passing gas, . Taking PO with small bites and sips. Denies SOB, Chest or Calf Pain. Using Incentive Spirometer, PAS in place. Ambulate 50  % weight bearing to right leg with pt working on transfers yesterday. Patient reports pain as  5/10  .  Pt states that she is feeling better today and is not currently feeling weak or dizzy.  Objective: Vital signs in last 24 hours: Vitals:   11/19/15 0828 11/19/15 1224 11/19/15 2055 11/20/15 0430  BP: (!) 113/45 (!) 122/55 126/60 (!) 145/69  Pulse: 88 68 (!) 103 99  Resp: 18 18  17   Temp: 98.8 F (37.1 C) 100.3 F (37.9 C) 99 F (37.2 C) 98.9 F (37.2 C)  TempSrc: Oral Oral Oral Oral  SpO2: 100% 95% 96% 100%  Weight:      Height:          Intake/Output from previous day: I/O last 3 completed shifts: In: 2295 [P.O.:120; I.V.:2175] Out: 850 [Urine:850]   Intake/Output this shift: No intake/output data recorded.   LABORATORY DATA:  Recent Labs  11/18/15 1052 11/18/15 1751  11/19/15 0905 11/20/15 0634  WBC 4.4  --   --  10.3 8.9  HGB 9.8* 7.8*  < > 9.5* 7.8*  HCT 29.6* 23.0*  < > 27.7* 23.2*  PLT 146*  --   --  95* 94*  NA 126* 133*  --   --   --   K 4.2 4.1  --   --   --   CL 94*  --   --   --   --   CO2 23  --   --   --   --   BUN 10  --   --   --   --   CREATININE 0.79  --   --   --   --   GLUCOSE 105*  --   --   --   --   CALCIUM 9.2  --   --   --   --   < > = values in this interval not displayed.  Examination: Neurologically intact Neurovascular intact Sensation intact distally Intact pulses distally Dorsiflexion/Plantar flexion intact Incision: scant drainage No cellulitis present Compartment soft} XR AP&Lat of hip shows well placed\fixed THA  Assessment:   2 Days Post-Op Procedure(s)  (LRB): TOTAL HIP REVISION (Right) ADDITIONAL DIAGNOSIS:  Expected Acute Blood Loss Anemia, Hypertension, anxiety,anemia, chronic hyponatremia   Plan: PT/OT WBAT, THA  DVT Prophylaxis: SCDx72 hrs, ASA 325 mg BID x 2 weeks  DISCHARGE PLAN: Skilled Nursing Facility/Rehab, [facility] in [place]: HHPT, Walker and 3-in-1 comode seat

## 2015-11-20 NOTE — Plan of Care (Signed)
Problem: Education: Goal: Knowledge of [facility] Education information/materials will improve Outcome: Progressing No fall or injury noted this shift, safety precautions and fall preventions maintained  Problem: Skin Integrity: Goal: Risk for impaired skin integrity will decrease Outcome: Progressing acquacels remain dry and intact to rt. Hip surgical sites  Problem: Activity: Goal: Risk for activity intolerance will decrease Outcome: Progressing Has difficulty turning from one side to another, unable to get out of bed this shift  Problem: Nutrition: Goal: Adequate nutrition will be maintained Outcome: Progressing Denies nausea and vomiting this shift,   Problem: Bowel/Gastric: Goal: Will not experience complications related to bowel motility Outcome: Progressing Denies constipation and bowel discomfort  Problem: Pain Managment: Goal: General experience of comfort will improve Outcome: Progressing Pain improved, medicated once for pain this shift

## 2015-11-20 NOTE — Progress Notes (Signed)
Spoke with Joanell Rising, PA regarding most recent H & H at 0630 this am. PA aware. No new orders at this time.

## 2015-11-20 NOTE — Progress Notes (Signed)
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 Original THA was performed in 2007. PMH including but not limited to anemia, HTN, vertigo, seizures, and PTSD.     PT Comments    Pt limited today by fatigue. Her hgb is 7.8.  She did agree to get OOB to recliner.  Recalled 2/3 of precautions and 50% PWB status. Con't to recommend SNF.  Follow Up Recommendations  SNF     Equipment Recommendations  None recommended by PT    Recommendations for Other Services       Precautions / Restrictions Precautions Precautions: Posterior Hip;Fall Precaution Comments: Pt recalled 2/3 (forgetting no hip flex ) and recalled PWB status Restrictions Weight Bearing Restrictions: Yes RLE Weight Bearing: Partial weight bearing RLE Partial Weight Bearing Percentage or Pounds: 50    Mobility  Bed Mobility Overal bed mobility: Needs Assistance Bed Mobility: Supine to Sit     Supine to sit: Mod assist;HOB elevated     General bed mobility comments: A for R LE and use of bed pad to get hips turned and to EOB. Pt able to manage trunk. cues for technique.  Transfers Overall transfer level: Needs assistance Equipment used: Rolling walker (2 wheeled) Transfers: Sit to/from [facility] Sit to Stand: Min assist Stand pivot transfers: Min assist       General transfer comment: Pt performed SPT with MIN A with more of scooting of feet across floor with no clearance  Ambulation/Gait                 Stairs            Wheelchair Mobility    Modified Rankin (Stroke Patients Only)       Balance             Standing balance-Leahy Scale: Poor                      Cognition Arousal/Alertness: Awake/alert Behavior During Therapy: WFL for tasks  assessed/performed Overall Cognitive Status: Within Functional Limits for tasks assessed       Memory: Decreased recall of precautions              Exercises Total Joint Exercises Ankle Circles/Pumps: AROM;Both;10 reps Quad Sets: Strengthening;Right;5 reps;Supine Heel Slides: AAROM;Right;10 reps Hip ABduction/ADduction: AAROM;Right;10 reps;Supine    General Comments General comments (skin integrity, edema, etc.): Pt with Hgb 7.8 and reports feeling weak.  Appears tired.      Pertinent Vitals/Pain Pain Assessment: 0-10 Pain Score: 3  Pain Location: R hip Pain Descriptors / Indicators: Sore;Operative site guarding Pain Intervention(s): Limited activity within patient's tolerance;Monitored during session;Repositioned;Other (comment) (declined ice)    Home Living                      Prior Function            PT Goals (current goals can now be found in the care plan section) Acute Rehab PT Goals Patient Stated Goal: Return home after Rehab at SNF PT Goal Formulation: With patient Time For Goal Achievement: 11/26/15 Potential to Achieve Goals: Good Progress towards PT goals: Not progressing toward goals - comment (too fatigued to ambulate)    Frequency  7X/week    PT Plan Current plan remains  appropriate    Co-evaluation             End of Session Equipment Utilized During Treatment: Gait belt Activity Tolerance: Patient limited by fatigue Patient left: in chair;with call bell/phone within reach     Time: 1316-1334 PT Time Calculation (min) (ACUTE ONLY): 18 min  Charges:  $Therapeutic Exercise: 8-22 mins                    G Codes:      Elray Dains LUBECK 11/20/2015, 1:48 PM

## 2015-11-20 NOTE — Evaluation (Signed)
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 Original THA was performed in 2007. PMH including but not limited to anemia, HTN, vertigo, seizures, and PTSD.    Clinical Impression   Pt is a pleasant 60 y/o female admitted as above, s/p R total hip revision. She presents with deficits in her ability to perform ADL's and functional transfers at this time (see problem list below) and should benefit from acute OT services to assist in maximizing independence for daily activities followed by SNF Rehab prior to going home.     Follow Up Recommendations  SNF;Supervision/Assistance - 24 hour    Equipment Recommendations  None recommended by OT;Other (comment) (Defer to next venue)    Recommendations for Other Services       Precautions / Restrictions Precautions Precautions: Posterior Hip;Fall Restrictions Weight Bearing Restrictions: Yes RLE Weight Bearing: Partial weight bearing RLE Partial Weight Bearing Percentage or Pounds: 50      Mobility Bed Mobility Overal bed mobility: Needs Assistance Bed Mobility: Supine to Sit;Sit to Supine     Supine to sit: Mod assist;HOB elevated Sit to supine: Mod assist   General bed mobility comments: Pt required increased time, assistance w/ LE's, VC's for technique, using bedrails/  Transfers Overall transfer level: Needs assistance Equipment used: Rolling walker (2 wheeled) Transfers: Sit to/from [facility] Sit to Stand: Min assist;+2 physical assistance Stand pivot transfers: Min guard;+2 safety/equipment            Balance Overall balance assessment: Needs assistance Sitting-balance support: Bilateral upper extremity supported;Feet supported Sitting balance-Leahy Scale: Fair     Standing  balance support: Bilateral upper extremity supported Standing balance-Leahy Scale: Fair                              ADL Overall ADL's : Needs assistance/impaired Eating/Feeding: Modified independent;Bed level   Grooming: Wash/dry hands;Wash/dry face;Oral care;Set up;Sitting   Upper Body Bathing: Set up;Min guard;Sitting   Lower Body Bathing: Maximal assistance;Bed level   Upper Body Dressing : Min guard;Sitting   Lower Body Dressing: Maximal assistance;Bed level Lower Body Dressing Details (indicate cue type and reason): To don socks  Toilet Transfer: Minimal assistance;+2 for physical assistance;+2 for safety/equipment;RW;BSC;Stand-pivot Toilet Transfer Details (indicate cue type and reason): Pt transferred to 3:1 beside bed taking 2-3 small steps and SPT Toileting- Clothing Manipulation and Hygiene: Minimal assistance;Sit to/from stand       Functional mobility during ADLs: Minimal assistance;Rolling walker;+2 for physical assistance;+2 for safety/equipment General ADL Comments: Pt is currently requiring increased assistance for ADL's and functional activities following recent posterior right hip revision. Her HGB was 7.8 this morning & pt stated that she had to use commode. Treatment session was focused use of 3:1 at bedside using RW & SPT as pt requested this. She was then assisted back to bed secondary to low HGB so she could rest. Pt was educated in role of OT and SNF rehab was discussed as well. Pt plans to d/c to SNF Rehab whn medically able, will follow acutely for ADL retraining w/ focus on increasing independence.     Vision  Wears glasses for reading only; No change from baseline.   Perception     Praxis      Pertinent  Vitals/Pain Pain Assessment: No/denies pain Pain Score: 0-No pain     Hand Dominance Right   Extremity/Trunk Assessment Upper Extremity Assessment Upper Extremity Assessment: Generalized weakness   Lower Extremity Assessment Lower  Extremity Assessment: Defer to PT evaluation       Communication Communication Communication: No difficulties   Cognition Arousal/Alertness: Awake/alert Behavior During Therapy: WFL for tasks assessed/performed Overall Cognitive Status: Within Functional Limits for tasks assessed                     General Comments       Exercises       Shoulder Instructions      Home Living Family/patient expects to be discharged to:: Skilled nursing facility Living Arrangements: Children                               Additional Comments: May be able to stay with daughter but would still be alone during the day. No assistance available at home. States that she did go to SNF following the initial surgery.      Prior Functioning/Environment Level of Independence: Independent with assistive device(s)        Comments: Pt stated that she was using a w/c for functional mobility prior to admission for approximately one year.    OT Diagnosis: Generalized weakness;Acute pain   OT Problem List: Decreased strength;Decreased knowledge of use of DME or AE;Decreased knowledge of precautions;Decreased activity tolerance;Pain;Impaired balance (sitting and/or standing)   OT Treatment/Interventions: Self-care/ADL training;Balance training;Therapeutic activities;DME and/or AE instruction;Patient/family education    OT Goals(Current goals can be found in the care plan section) Acute Rehab OT Goals Patient Stated Goal: Return home after Rehab at SNF OT Goal Formulation: With patient Time For Goal Achievement: 12/04/15 Potential to Achieve Goals: Good  OT Frequency: Min 2X/week   Barriers to D/C:            Co-evaluation              End of Session Equipment Utilized During Treatment: Gait belt;Rolling walker Nurse Communication: Mobility status;Other (comment) (lethergy/fatigue limiting session, RN to monitor HGB)  Activity Tolerance: Patient limited by  fatigue;Patient limited by lethargy (HGB 7.8 however, pt requesting to use 3:1 bedside today.) Patient left: in bed;with call bell/phone within reach;with nursing/sitter in room   Time: [id number] OT Time Calculation (min): 41 min Charges:  OT General Charges $OT Visit: 1 Procedure OT Evaluation $OT Eval Moderate Complexity: 1 Procedure OT Treatments $Self Care/Home Management : 8-22 mins G-Codes:    Josephine Igo Dixon, OTR/L 11/20/2015, 9:50 AM

## 2015-11-20 NOTE — Clinical Social Work Note (Signed)
Clinical Social Work Assessment  Patient Details  Name: Christina French MRN: [id number] Date of Birth: 09-30-1955  Date of referral:  11/20/15               Reason for consult:  Facility Placement                Permission sought to share information with:  Chartered certified accountant granted to share information::  Yes, Verbal Permission Granted  Name::        Agency::   ([facility] is preference)  Relationship::     Contact Information:     Housing/Transportation Living arrangements for the past 2 months:  [place] of Information:  Patient Patient Interpreter Needed:  None Criminal Activity/Legal Involvement Pertinent to Current Situation/Hospitalization:  No - Comment as needed Significant Relationships:  Adult Children Lives with:  Self Do you feel safe going back to the place where you live?  No Need for family participation in patient care:  No (Coment)  Care giving concerns:  No caregivers present during assessment.   Social Worker assessment / plan:  CSW met with patient at bedside to review disposition.  Patient states she lives two doors down from [facility] and wishes to be discharged to that facility once medially stable.  Patient will need PTAR transportation.  Patient states that during her hip revision, her femur "snapped" and has had to have plates and screws inserted for support.  CSW provided support and comfort.  Employment status:  Disabled (Comment on whether or not currently receiving Disability) Insurance information:  Medicare, Medicaid In [place] PT Recommendations:  [place] / Referral to community resources:  [place]  Patient/Family's Response to care:  Patient is agreeable to SNF  Patient/Family's Understanding of and Emotional Response to Diagnosis, Current Treatment, and Prognosis:  Patient reports having a difficult time with this revision.  States she is in the worst  pain she has ever felt.  Patient states she wishes she could just go home and not have to "deal with this".  CSW offered comfort and support  Emotional Assessment Appearance:  Appears older than stated age Attitude/Demeanor/Rapport:    Affect (typically observed):  Accepting Orientation:  Oriented to Self, Oriented to Place, Oriented to  Time, Oriented to Situation Alcohol / Substance use:  Not Applicable Psych involvement (Current and /or in the community):  No (Comment)  Discharge Needs  Concerns to be addressed:  No discharge needs identified Readmission within the last 30 days:  No Current discharge risk:  None Barriers to Discharge:  Continued Medical Work up   Christina Fanny, LCSW 11/20/2015, 3:34 PM

## 2015-11-20 NOTE — Clinical Social Work Placement (Signed)
   CLINICAL SOCIAL WORK PLACEMENT  NOTE  Date:  11/20/2015  Patient Details  Name: FAVIANA SANDVICK MRN: [id number] Date of Birth: 1955-05-28  Clinical Social Work is seeking post-discharge placement for this patient at the [place] level of care (*CSW will initial, date and re-position this form in  chart as items are completed):  Yes   Patient/family provided with [facility] Work Department's list of facilities offering this level of care within the geographic area requested by the patient (or if unable, by the patient's family).  Yes   Patient/family informed of their freedom to choose among providers that offer the needed level of care, that participate in Medicare, Medicaid or managed care program needed by the patient, have an available bed and are willing to accept the patient.  Yes   Patient/family informed of [facility]'s ownership interest in [facility] and [facility], as well as of the fact that they are under no obligation to receive care at these facilities.  PASRR submitted to EDS on 11/20/15     PASRR number received on 11/20/15     Existing PASRR number confirmed on       FL2 transmitted to all facilities in geographic area requested by pt/family on 11/20/15     FL2 transmitted to all facilities within larger geographic area on       Patient informed that his/her managed care company has contracts with or will negotiate with certain facilities, including the following:        Yes   Patient/family informed of bed offers received.  Patient chooses bed at [facility]     Physician recommends and patient chooses bed at      Patient to be transferred to [facility] on  .  Patient to be transferred to facility by PTAR     Patient family notified on   of transfer.  Name of family member notified:        PHYSICIAN Please sign FL2     Additional Comment:     _______________________________________________ Dulcy Fanny, LCSW 11/20/2015, 3:34 PM

## 2015-11-21 LAB — CBC
HCT: 20.8 % — ABNORMAL LOW (ref 36.0–46.0)
HEMOGLOBIN: 6.9 g/dL — AB (ref 12.0–15.0)
MCH: 27.7 pg (ref 26.0–34.0)
MCHC: 33.2 g/dL (ref 30.0–36.0)
MCV: 83.5 fL (ref 78.0–100.0)
Platelets: 91 10*3/uL — ABNORMAL LOW (ref 150–400)
RBC: 2.49 MIL/uL — ABNORMAL LOW (ref 3.87–5.11)
RDW: 13.8 % (ref 11.5–15.5)
WBC: 6.1 10*3/uL (ref 4.0–10.5)

## 2015-11-21 LAB — PREPARE RBC (CROSSMATCH)

## 2015-11-21 MED ORDER — SODIUM CHLORIDE 0.9 % IV SOLN
Freq: Once | INTRAVENOUS | Status: AC
  Administered 2015-11-21: 08:00:00 via INTRAVENOUS

## 2015-11-21 NOTE — Progress Notes (Signed)
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 Original THA was performed in 2007. PMH including but not limited to anemia, HTN, vertigo, seizures, and PTSD.     PT Comments    Pt performed increased mobility this pm.  Pt HGb 6.9 but already received first unit of blood and reports no symptoms.  Pt performed therapeutic exercises in supine.  Will f/u in am to progress patient per POC.    Follow Up Recommendations  SNF     Equipment Recommendations  None recommended by PT    Recommendations for Other Services       Precautions / Restrictions Precautions Precautions: Posterior Hip;Fall Precaution Booklet Issued: Yes (comment) Precaution Comments: Pt able to recall 3/3 precautions but required reviewed to verbalize definition of PWB status.   Restrictions Weight Bearing Restrictions: Yes RLE Weight Bearing: Partial weight bearing RLE Partial Weight Bearing Percentage or Pounds: 50    Mobility  Bed Mobility Overal bed mobility: Needs Assistance Bed Mobility: Supine to Sit     Supine to sit: Mod assist;HOB elevated Sit to supine: Mod assist   General bed mobility comments: A for R LE and use of bed pad to get hips turned and to EOB. Pt able to manage trunk. cues for technique.  Transfers Overall transfer level: Needs assistance Equipment used: Rolling walker (2 wheeled) Transfers: Sit to/from Stand Sit to Stand: Min assist         General transfer comment: Cues for hand placement and pushing from seated surface.    Ambulation/Gait Ambulation/Gait assistance: Min assist Ambulation Distance (Feet): 60 Feet Assistive device: Rolling walker (2 wheeled) Gait Pattern/deviations: Step-to pattern;Decreased step length - right;Decreased stance time -  left;Decreased weight shift to left;Trunk flexed   Gait velocity interpretation: Below normal speed for age/gender General Gait Details: Cues for UE use to maintain PWB status.  Pt required cues for sequencing and upper trunk control.  Fatigues with mobility.     Stairs            Wheelchair Mobility    Modified Rankin (Stroke Patients Only)       Balance Overall balance assessment: Needs assistance   Sitting balance-Leahy Scale: Fair       Standing balance-Leahy Scale: Poor                      Cognition Arousal/Alertness: Awake/alert Behavior During Therapy: WFL for tasks assessed/performed Overall Cognitive Status: Within Functional Limits for tasks assessed       Memory: Decreased recall of precautions              Exercises Total Joint Exercises Ankle Circles/Pumps: AROM;Both;10 reps;Supine Quad Sets: AROM;Right;10 reps;Supine Short Arc Quad: AAROM;Right;10 reps;Supine Heel Slides: AAROM;Right;10 reps;Supine Hip ABduction/ADduction: AROM;AAROM;Right;10 reps;Supine    General Comments        Pertinent Vitals/Pain Pain Assessment: 0-10 Pain Score: 3  Pain Location: R hip Pain Descriptors / Indicators: Sore;Operative site guarding Pain Intervention(s): Monitored during session;Relaxation;Repositioned    Home Living                      Prior Function            PT Goals (current goals can now be found in the care plan section) Acute Rehab  PT Goals Patient Stated Goal: Return home after Rehab at SNF Potential to Achieve Goals: Good Progress towards PT goals: Progressing toward goals    Frequency  7X/week    PT Plan Current plan remains appropriate    Co-evaluation             End of Session Equipment Utilized During Treatment: Gait belt Activity Tolerance: Patient limited by fatigue Patient left: in chair;with call bell/phone within reach     Time: 1546-1616 PT Time Calculation (min) (ACUTE ONLY): 30  min  Charges:  $Gait Training: 8-22 mins $Therapeutic Exercise: 8-22 mins                    G Codes:      Cristela Blue 12-Dec-2015, 5:31 PM  Governor Rooks, PTA pager [phone]

## 2015-11-21 NOTE — Progress Notes (Signed)
Patient ID: Christina French, female   DOB: Aug 14, 1955, 60 y.o.   MRN: [id number] PATIENT ID: Christina French  MRN: [id number]  DOB/AGE:  1956/04/10 / 60 y.o.  3 Days Post-Op Procedure(s) (LRB): TOTAL HIP REVISION (Right)    PROGRESS NOTE Subjective: Patient is alert, oriented, 1 Nausea, no Vomiting, yes passing gas, . Taking PO well. Denies SOB, Chest or Calf Pain. Using Incentive Spirometer, PAS in place. Ambulate bed to chair  pwb Patient reports pain as  7/10  .    Objective: Vital signs in last 24 hours: Vitals:   11/20/15 2150 11/20/15 2200 11/20/15 2217 11/21/15 0448  BP: 138/82  138/85 135/75  Pulse: (!) 105  (!) 105 (!) 107  Resp: 16     Temp: (!) 101 F (38.3 C) 99.3 F (37.4 C)  99.3 F (37.4 C)  TempSrc: Oral   Oral  SpO2: 100%   100%  Weight:      Height:          Intake/Output from previous day: I/O last 3 completed shifts: In: 3548.8 [P.O.:1080; I.V.:2468.8] Out: -    Intake/Output this shift: No intake/output data recorded.   LABORATORY DATA:  Recent Labs  11/18/15 1052 11/18/15 1751  11/20/15 0634 11/21/15 0520  WBC 4.4  --   < > 8.9 6.1  HGB 9.8* 7.8*  < > 7.8* 6.9*  HCT 29.6* 23.0*  < > 23.2* 20.8*  PLT 146*  --   < > 94* 91*  NA 126* 133*  --   --   --   K 4.2 4.1  --   --   --   CL 94*  --   --   --   --   CO2 23  --   --   --   --   BUN 10  --   --   --   --   CREATININE 0.79  --   --   --   --   GLUCOSE 105*  --   --   --   --   CALCIUM 9.2  --   --   --   --   < > = values in this interval not displayed.  Examination: Neurologically intact ABD soft Neurovascular intact Sensation intact distally Intact pulses distally Dorsiflexion/Plantar flexion intact Incision: scant drainage No cellulitis present Compartment soft} Right eye is 1-2+ swollen XR AP&Lat of hip shows well placed\fixed THA, And distal femoral locking plate  Assessment:   3 Days Post-Op Procedure(s) (LRB): TOTAL HIP REVISION (Right), Intraoperative supracondylar  fracture of the  Right knee, fixed with a locking plate ADDITIONAL DIAGNOSIS:  Expected Acute Blood Loss Anemia, Hypertension  Plan:  Transfuse 2 units packed red blood cells for postoperative anemia  PT/OT 50% weightbearing, Posterior hip precautions  DVT Prophylaxis: SCDx72 hrs, ASA 325 mg BID x 2 weeks  DISCHARGE PLAN: Skilled Nursing Facility/Rehab  DISCHARGE NEEDS: HHPT, Walker and 3-in-1 comode seat

## 2015-11-21 NOTE — Progress Notes (Signed)
OT Cancellation Note  Patient Details Name: Christina French MRN: [id number] DOB: 12-Apr-1956   Cancelled Treatment:    Reason Eval/Treat Not Completed: Medical issues which prohibited therapy (Pt with hgb of 6.9. Will follow.)  [name] 11/21/2015, 8:20 AM  [phone]

## 2015-11-21 NOTE — Progress Notes (Signed)
Called placed to on-call MD regarding lab value of 6.9 hemoglobin.

## 2015-11-21 NOTE — Progress Notes (Signed)
First unit of blood initiated with verification provided. Remained with pt during the first 30 min as IV pump wasn't functioning correctly. Retreived new pump. Blood infusing without difficulty. Pt tolerating well in recliner.

## 2015-11-21 NOTE — Progress Notes (Signed)
No return call from MD regarding 6.9 hemoglobin on pt.   Call placed to Joanell Rising, PA to inform of lab value. PA states that Dr. Mayer Camel will be rounding within the hour and will address.

## 2015-11-22 ENCOUNTER — Encounter
Admission: RE | Admit: 2015-11-22 | Discharge: 2015-11-22 | Disposition: A | Discharge status: Home / Self Care | Payer: Medicare Other | Source: Ambulatory Visit | Attending: Internal Medicine | Admitting: Internal Medicine

## 2015-11-22 DIAGNOSIS — K219 Gastro-esophageal reflux disease without esophagitis: Secondary | ICD-10-CM | POA: Diagnosis not present

## 2015-11-22 DIAGNOSIS — Z4789 Encounter for other orthopedic aftercare: Secondary | ICD-10-CM | POA: Diagnosis not present

## 2015-11-22 DIAGNOSIS — E871 Hypo-osmolality and hyponatremia: Secondary | ICD-10-CM | POA: Diagnosis not present

## 2015-11-22 DIAGNOSIS — M25559 Pain in unspecified hip: Secondary | ICD-10-CM | POA: Diagnosis not present

## 2015-11-22 DIAGNOSIS — K589 Irritable bowel syndrome without diarrhea: Secondary | ICD-10-CM | POA: Diagnosis not present

## 2015-11-22 DIAGNOSIS — Z9660 Presence of unspecified orthopedic joint implant: Secondary | ICD-10-CM | POA: Diagnosis not present

## 2015-11-22 DIAGNOSIS — Z96641 Presence of right artificial hip joint: Secondary | ICD-10-CM | POA: Diagnosis not present

## 2015-11-22 DIAGNOSIS — M6281 Muscle weakness (generalized): Secondary | ICD-10-CM | POA: Diagnosis not present

## 2015-11-22 DIAGNOSIS — R2689 Other abnormalities of gait and mobility: Secondary | ICD-10-CM | POA: Diagnosis not present

## 2015-11-22 DIAGNOSIS — M159 Polyosteoarthritis, unspecified: Secondary | ICD-10-CM | POA: Diagnosis not present

## 2015-11-22 DIAGNOSIS — R569 Unspecified convulsions: Secondary | ICD-10-CM | POA: Diagnosis not present

## 2015-11-22 DIAGNOSIS — D649 Anemia, unspecified: Secondary | ICD-10-CM | POA: Diagnosis present

## 2015-11-22 DIAGNOSIS — I10 Essential (primary) hypertension: Secondary | ICD-10-CM | POA: Diagnosis not present

## 2015-11-22 DIAGNOSIS — K2270 Barrett's esophagus without dysplasia: Secondary | ICD-10-CM | POA: Diagnosis not present

## 2015-11-22 DIAGNOSIS — S72451D Displaced supracondylar fracture without intracondylar extension of lower end of right femur, subsequent encounter for closed fracture with routine healing: Secondary | ICD-10-CM | POA: Diagnosis not present

## 2015-11-22 LAB — TYPE AND SCREEN
ABO/RH(D): O NEG
Antibody Screen: NEGATIVE
DONOR AG TYPE: NEGATIVE
DONOR AG TYPE: NEGATIVE
Donor AG Type: NEGATIVE
UNIT DIVISION: 0
Unit division: 0
Unit division: 0

## 2015-11-22 LAB — HEMOGLOBIN AND HEMATOCRIT, BLOOD
HCT: 27 % — ABNORMAL LOW (ref 36.0–46.0)
Hemoglobin: 9.2 g/dL — ABNORMAL LOW (ref 12.0–15.0)

## 2015-11-22 NOTE — Progress Notes (Signed)
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 Original THA was performed in 2007. PMH including but not limited to anemia, HTN, vertigo, seizures, and PTSD.    OT comments  Pt needs reinforcement with THPS (posterior).  Limited endurance  Follow Up Recommendations  SNF    Equipment Recommendations  None recommended by OT    Recommendations for Other Services      Precautions / Restrictions Precautions Precautions: Posterior Hip;Fall Restrictions Weight Bearing Restrictions: Yes RLE Weight Bearing: Partial weight bearing RLE Partial Weight Bearing Percentage or Pounds: 50       Mobility Bed Mobility               General bed mobility comments: oob  Transfers   Equipment used: Rolling walker (2 wheeled)   Sit to Stand: Min guard Stand pivot transfers: Min assist       General transfer comment: cues for UE/LE placement and light assistance to push up from recliner.  Cues for precautions during transfer    Balance                                   ADL                           Toilet Transfer: Minimal assistance;Stand-pivot;BSC;RW   Toileting- Clothing Manipulation and Hygiene: Min guard;Sit to/from stand         General ADL Comments: pt tended to swivel feet to get to commode. Cued to push through walker to move feet and avoid IR. Reviewed precautions.  Pt did not want to work with AE this visit.  Reviewed use of sock aide, tossing at diagonal to avoid IR.        Vision                     Perception     Praxis      Cognition   Behavior During Therapy: WFL for tasks assessed/performed Overall Cognitive Status: Within Functional Limits for tasks assessed                        Extremity/Trunk Assessment               Exercises     Shoulder Instructions       General Comments      Pertinent Vitals/ Pain       Pain Assessment: 0-10 Pain Score: 2  Pain Location: R hip Pain Descriptors / Indicators: Sore Pain Intervention(s): Limited activity within patient's tolerance;Monitored during session;Repositioned;Ice applied;Premedicated before session  Home Living                                          Prior Functioning/Environment              Frequency       Progress Toward Goals  OT Goals(current goals can now be found in the care plan section)  Progress towards OT goals: Progressing toward goals     Plan Discharge plan needs to be updated  Co-evaluation                 End of Session     Activity Tolerance Patient limited by fatigue   Patient Left in chair;with call bell/phone within reach   Nurse Communication          Time: [id number] OT Time Calculation (min): 15 min  Charges: OT General Charges $OT Visit: 1 Procedure OT Treatments $Self Care/Home Management : 8-22 mins  Sukhdeep Wieting 11/22/2015, 9:56 AM Lesle Chris, OTR/L [phone] 11/22/2015

## 2015-11-22 NOTE — Progress Notes (Signed)
PATIENT ID: Christina French  MRN: [id number]  DOB/AGE:  11/26/1955 / 60 y.o.  4 Days Post-Op Procedure(s) (LRB): TOTAL HIP REVISION (Right)    PROGRESS NOTE Subjective: Patient is alert, oriented, no Nausea, no Vomiting, yes passing gas, . Taking PO well. Denies SOB, Chest or Calf Pain. Using Incentive Spirometer, PAS in place. Ambulate  50% weight bearing to right lower leg.  Pt walking 60 ft with therapy yesterday. Patient reports pain as  moderate .    Pt received 2 units of blood yesterday and states that she is less fatigued.  Objective: Vital signs in last 24 hours: Vitals:   11/21/15 1830 11/21/15 2005 11/21/15 2042 11/22/15 0510  BP: 139/64  (!) 103/59 (!) 109/49  Pulse: 96  98 83  Resp: 20     Temp: 100.1 F (37.8 C) 98.9 F (37.2 C) 98.2 F (36.8 C) 98.4 F (36.9 C)  TempSrc: Oral  Oral Oral  SpO2:   100% 96%  Weight:      Height:          Intake/Output from previous day: I/O last 3 completed shifts: In: 2183.8 [P.O.:840; I.V.:1343.8] Out: -    Intake/Output this shift: No intake/output data recorded.   LABORATORY DATA:  Recent Labs  11/20/15 0634 11/21/15 0520  WBC 8.9 6.1  HGB 7.8* 6.9*  HCT 23.2* 20.8*  PLT 94* 91*    Examination: Neurologically intact Neurovascular intact Sensation intact distally Intact pulses distally Dorsiflexion/Plantar flexion intact Incision: moderate drainage No cellulitis present Compartment soft} XR AP&Lat of hip shows well placed\fixed THA  Assessment:   4 Days Post-Op Procedure(s) (LRB): TOTAL HIP REVISION (Right) ADDITIONAL DIAGNOSIS:  Expected Acute Blood Loss Anemia, Hypertension  Plan: PT/OT WB 50% to right lower leg, THA, posterior precautions  DVT Prophylaxis: SCDx72 hrs, ASA 325 mg BID x 2 weeks  DISCHARGE PLAN: Skilled Nursing Facility/Rehab today  DISCHARGE NEEDS: HHPT, Walker and 3-in-1 comode seat

## 2015-11-22 NOTE — Discharge Summary (Signed)
Patient ID: MANIYAH GUYMON MRN: [id number] DOB/AGE: [birth year] 60 y.o.  Admit date: 11/18/2015 Discharge date: 11/22/2015  Admission Diagnoses:  Active Problems:   S/P revision of total hip   Discharge Diagnoses:  Same  Past Medical History:  Diagnosis Date  . Anemia   . Anxiety   . Arthritis   . Barrett esophagus   . Cancer ([place]) 2016    Skin cancer right leg mylenoma  . Depression   . GERD (gastroesophageal reflux disease)   . History of blood transfusion    with a surgery  . History of bronchitis   . History of pneumonia   . Hypertension   . IBS (irritable bowel syndrome)   . Pneumonia 09/27/15  . PTSD (post-traumatic stress disorder)   . Renal insufficiency    reports acute kidney injury  . Seizures ([place])    rare - last one 07/30/14 - was very anemic.  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 Vertigo    thinks related to sinus issues    Surgeries: Procedure(s): TOTAL HIP REVISION on 11/18/2015   Consultants:   Discharged Condition: Improved  Hospital Course: TIMIA MWANGI is an 60 y.o. female who was admitted 11/18/2015 for operative treatment of<principal problem not specified>. Patient has severe unremitting pain that affects sleep, daily activities, and work/hobbies. After pre-op clearance the patient was taken to the operating room on 11/18/2015 and underwent  Procedure(s): TOTAL HIP REVISION.    Patient was given perioperative antibiotics: Anti-infectives    Start     Dose/Rate Route Frequency Ordered Stop   11/18/15 1100  ceFAZolin (ANCEF) IVPB 2g/100 mL premix     2 g 200 mL/hr over 30 Minutes Intravenous To ShortStay Surgical 11/18/15 1035 11/18/15 1740   11/18/15 1043  ceFAZolin (ANCEF) 2-4 GM/100ML-% IVPB    Comments:  Forte, Lindsi   : cabinet override      11/18/15 1043 11/18/15 2259       Patient was given sequential compression devices, early ambulation, and chemoprophylaxis to prevent DVT.  Patient benefited maximally from hospital stay and there were no  complications.    Recent vital signs: Patient Vitals for the past 24 hrs:  BP Temp Temp src Pulse Resp SpO2  11/22/15 0510 (!) 109/49 98.4 F (36.9 C) Oral 83 - 96 %  11/21/15 2042 (!) 103/59 98.2 F (36.8 C) Oral 98 - 100 %  11/21/15 2005 - 98.9 F (37.2 C) - - - -  11/21/15 1830 139/64 100.1 F (37.8 C) Oral 96 20 -  11/21/15 1500 (!) 117/57 98.5 F (36.9 C) Oral 95 20 98 %  11/21/15 1444 (!) 104/50 99.1 F (37.3 C) Oral 86 20 -  11/21/15 1415 (!) 104/50 99.1 F (37.3 C) Oral 86 20 -  11/21/15 1145 96/62 99.1 F (37.3 C) Oral 81 20 99 %  11/21/15 1115 (!) 102/47 99.7 F (37.6 C) Oral 79 20 98 %     Recent laboratory studies:  Recent Labs  11/20/15 0634 11/21/15 0520  WBC 8.9 6.1  HGB 7.8* 6.9*  HCT 23.2* 20.8*  PLT 94* 91*     Discharge Medications:     Medication List    TAKE these medications   acetaminophen 500 MG tablet Commonly known as:  TYLENOL Take 1,000 mg by mouth 2 (two) times daily as needed for mild pain or headache.   ALPRAZolam 1 MG tablet Commonly known as:  XANAX Take 1 mg by mouth 3 (three) times daily as needed  for anxiety.   aspirin EC 325 MG tablet Take 1 tablet (325 mg total) by mouth 2 (two) times daily.   atenolol 50 MG tablet Commonly known as:  TENORMIN Take 50 mg by mouth 2 (two) times daily.   baclofen 10 MG tablet Commonly known as:  LIORESAL Take 10 mg by mouth 3 (three) times daily.   carbamazepine 200 MG tablet Commonly known as:  TEGRETOL Take 300 mg by mouth 2 (two) times daily.   escitalopram 20 MG tablet Commonly known as:  LEXAPRO Take 20 mg by mouth daily.   hyoscyamine 0.125 MG tablet Commonly known as:  LEVSIN, ANASPAZ Take 1 tablet (0.125 mg total) by mouth 4 (four) times daily as needed for cramping.   mirtazapine 15 MG tablet Commonly known as:  REMERON Take 15 mg by mouth at bedtime.   ondansetron 8 MG tablet Commonly known as:  ZOFRAN Take 8 mg by mouth 2 (two) times daily as needed for nausea  or vomiting.   oxyCODONE-acetaminophen 5-325 MG tablet Commonly known as:  ROXICET Take 1 tablet by mouth every 4 (four) hours as needed.   pantoprazole 40 MG tablet Commonly known as:  PROTONIX Take 40 mg by mouth daily.   traZODone 50 MG tablet Commonly known as:  DESYREL Take 50 mg by mouth at bedtime.       Diagnostic Studies: Dg C-arm Gt 120 Min  Result Date: 11/18/2015 CLINICAL DATA:  Right hip arthroplasty and femur fracture fixation. EXAM: DG C-ARM GT 120 MIN; RIGHT FEMUR 2 VIEWS COMPARISON:  CT scan 08/06/2015 FINDINGS: There is a long stem femoral prosthesis in good position without complicating features. There is also a long sideplate with cervical arch wires and distal interlocking screws. Good position and alignment without complicating features. IMPRESSION: Long stem femoral prosthesis and acetabular components in good position without complicating features. Long lateral sideplate with fixing cerclage wires and distal screws. Electronically Signed   By: Marijo Sanes M.D.   On: 11/18/2015 18:19  Dg Femur, Min 2 Views Right  Result Date: 11/18/2015 CLINICAL DATA:  Right hip arthroplasty and femur fracture fixation. EXAM: DG C-ARM GT 120 MIN; RIGHT FEMUR 2 VIEWS COMPARISON:  CT scan 08/06/2015 FINDINGS: There is a long stem femoral prosthesis in good position without complicating features. There is also a long sideplate with cervical arch wires and distal interlocking screws. Good position and alignment without complicating features. IMPRESSION: Long stem femoral prosthesis and acetabular components in good position without complicating features. Long lateral sideplate with fixing cerclage wires and distal screws. Electronically Signed   By: Marijo Sanes M.D.   On: 11/18/2015 18:19   Disposition: 03-Skilled Nursing Facility  Discharge Instructions    Call MD / Call 911    Complete by:  As directed   If you experience chest pain or shortness of breath, CALL 911 and be  transported to the hospital emergency room.  If you develope a fever above 101 F, pus (white drainage) or increased drainage or redness at the wound, or calf pain, call your surgeon's office.   Change dressing    Complete by:  As directed   You may change your dressing on day 5, then change the dressing daily with sterile 4 x 4 inch gauze dressing and paper tape.  You may clean the incision with alcohol prior to redressing   Constipation Prevention    Complete by:  As directed   Drink plenty of fluids.  Prune juice may be helpful.  You may use a stool softener, such as Colace (over the counter) 100 mg twice a day.  Use MiraLax (over the counter) for constipation as needed.   Diet - low sodium heart healthy    Complete by:  As directed   Driving restrictions    Complete by:  As directed   No driving for 2 weeks   Follow the hip precautions as taught in Physical Therapy    Complete by:  As directed   Increase activity slowly as tolerated    Complete by:  As directed   Patient may shower    Complete by:  As directed   You may shower without a dressing once there is no drainage.  Do not wash over the wound.  If drainage remains, cover wound with plastic wrap and then shower.      Follow-up Information    Kerin Salen, MD Follow up in 2 week(s).   Specialty:  Orthopedic Surgery Contact information: [place] [address] [phone]            Signed: Hardin Negus Keylie Beavers R 11/22/2015, 7:20 AM

## 2015-11-22 NOTE — Progress Notes (Signed)
Changed patient's aquacell dressing. Patient tolerated well.

## 2015-11-22 NOTE — Care Management Important Message (Signed)
Important Message  Patient Details  Name: Christina French MRN: [id number] Date of Birth: [birth year]   Medicare Important Message Given:  Yes    Loann Quill 11/22/2015, 10:46 AM

## 2015-11-22 NOTE — Clinical Social Work Note (Signed)
Patient to be discharged to [facility]. Patient updated at bedside. Patient to be transported via EMS. (11:30a pick up at [facility] request) RN report number: [phone] Room number: [place]: [id number] Surgical: [phone]

## 2015-11-22 NOTE — Clinical Social Work Placement (Signed)
   CLINICAL SOCIAL WORK PLACEMENT  NOTE  Date:  11/22/2015  Patient Details  Name: Christina French MRN: [id number] Date of Birth: Oct 29, 1955  Clinical Social Work is seeking post-discharge placement for this patient at the [place] level of care (*CSW will initial, date and re-position this form in  chart as items are completed):  Yes   Patient/family provided with [facility] Work Department's list of facilities offering this level of care within the geographic area requested by the patient (or if unable, by the patient's family).  Yes   Patient/family informed of their freedom to choose among providers that offer the needed level of care, that participate in Medicare, Medicaid or managed care program needed by the patient, have an available bed and are willing to accept the patient.  Yes   Patient/family informed of [facility]'s ownership interest in [facility] and [facility], as well as of the fact that they are under no obligation to receive care at these facilities.  PASRR submitted to EDS on 11/20/15     PASRR number received on 11/20/15     Existing PASRR number confirmed on       FL2 transmitted to all facilities in geographic area requested by pt/family on 11/20/15     FL2 transmitted to all facilities within larger geographic area on       Patient informed that his/her managed care company has contracts with or will negotiate with certain facilities, including the following:        Yes   Patient/family informed of bed offers received.  Patient chooses bed at [facility]     Physician recommends and patient chooses bed at      Patient to be transferred to [facility] on 11/22/15.  Patient to be transferred to facility by [facility]     Patient family notified on 11/22/15 of transfer.  Name of family member notified:  Patient     PHYSICIAN       Additional Comment:     _______________________________________________ [name], LCSW 11/22/2015, 10:34 AM

## 2015-11-22 NOTE — Progress Notes (Signed)
Physical Therapy Treatment Patient Details Name: Christina French MRN: [id number] DOB: 1956-01-02 Today's Date: 11/22/2015    History of Present Illness Pt is a 60 y/o female s/p R total hip revision secondary to infection; posterior hip precautions and PWB 50%. Original THA was performed in 2007. PMH including but not limited to anemia, HTN, vertigo, seizures, and PTSD.     PT Comments    Pt performed increased gait distance during intervention.  Pt fatigued post gait training.  Plans for d/c to SNF today.    Follow Up Recommendations  SNF     Equipment Recommendations  None recommended by PT    Recommendations for Other Services       Precautions / Restrictions Precautions Precautions: Posterior Hip;Fall Precaution Booklet Issued: Yes (comment) Precaution Comments: Pt able to recall 3/3 precautions but required reviewed to verbalize definition of PWB status.   Restrictions Weight Bearing Restrictions: Yes RLE Weight Bearing: Partial weight bearing RLE Partial Weight Bearing Percentage or Pounds: 50    Mobility  Bed Mobility Overal bed mobility: Needs Assistance Bed Mobility: Supine to Sit     Supine to sit: Min assist;HOB elevated Sit to supine: Mod assist   General bed mobility comments: Pt required cues for hand placement and assist for advancement of LEs into and out of bed.  Pt relies heavily of rail for assist.  PTA use chux pad to advance to edge of bed.    Transfers Overall transfer level: Needs assistance Equipment used: Rolling walker (2 wheeled) Transfers: Sit to/from Stand Sit to Stand: Supervision Stand pivot transfers: Min assist       General transfer comment: cues for UE/LE placement and light assistance to push up from recliner.  Cues for precautions during transfer  Ambulation/Gait Ambulation/Gait assistance: Min guard Ambulation Distance (Feet): 80 Feet Assistive device: Rolling walker (2 wheeled) Gait Pattern/deviations: Step-to  pattern;Decreased stride length;Trunk flexed;Antalgic   Gait velocity interpretation: Below normal speed for age/gender General Gait Details: Cues for UE use to maintain PWB status.  Pt remains to fatigue quickly and report weakness in UEs.  Pt required standing break x3.     Stairs            Wheelchair Mobility    Modified Rankin (Stroke Patients Only)       Balance     Sitting balance-Leahy Scale: Fair       Standing balance-Leahy Scale: Poor                      Cognition Arousal/Alertness: Awake/alert Behavior During Therapy: WFL for tasks assessed/performed Overall Cognitive Status: Within Functional Limits for tasks assessed                      Exercises      General Comments        Pertinent Vitals/Pain Pain Assessment: 0-10 Pain Score: 5  Pain Location: R hip Pain Descriptors / Indicators: Sore Pain Intervention(s): Monitored during session;Repositioned    Home Living                      Prior Function            PT Goals (current goals can now be found in the care plan section) Acute Rehab PT Goals Patient Stated Goal: Return home after Rehab at SNF Potential to Achieve Goals: Good Progress towards PT goals: Progressing toward goals    Frequency  7X/week  PT Plan Current plan remains appropriate    Co-evaluation             End of Session Equipment Utilized During Treatment: Gait belt Activity Tolerance: Patient limited by fatigue Patient left: in chair;with call bell/phone within reach     Time: 1152-1207 PT Time Calculation (min) (ACUTE ONLY): 15 min  Charges:  $Gait Training: 8-22 mins                    G Codes:      Cristela Blue 12-13-2015, 12:29 PM Governor Rooks, PTA pager [phone]

## 2015-11-22 NOTE — Progress Notes (Signed)
Provided discharge instructions and answered all questions. Contacted [employer] and gave report to [place].

## 2015-11-25 DIAGNOSIS — M159 Polyosteoarthritis, unspecified: Secondary | ICD-10-CM | POA: Diagnosis not present

## 2015-11-25 DIAGNOSIS — I10 Essential (primary) hypertension: Secondary | ICD-10-CM | POA: Diagnosis not present

## 2015-11-25 DIAGNOSIS — R569 Unspecified convulsions: Secondary | ICD-10-CM | POA: Diagnosis not present

## 2015-11-26 ENCOUNTER — Encounter
Admission: RE | Admit: 2015-11-26 | Discharge: 2015-11-26 | Disposition: A | Discharge status: Home / Self Care | Payer: Medicare Other | Source: Ambulatory Visit | Attending: Internal Medicine | Admitting: Internal Medicine

## 2015-11-26 DIAGNOSIS — D649 Anemia, unspecified: Secondary | ICD-10-CM | POA: Insufficient documentation

## 2015-11-26 DIAGNOSIS — E871 Hypo-osmolality and hyponatremia: Secondary | ICD-10-CM | POA: Insufficient documentation

## 2015-11-26 LAB — COMPREHENSIVE METABOLIC PANEL
ALBUMIN: 3.1 g/dL — AB (ref 3.5–5.0)
ALT: 16 U/L (ref 14–54)
ANION GAP: 10 (ref 5–15)
AST: 25 U/L (ref 15–41)
Alkaline Phosphatase: 80 U/L (ref 38–126)
BILIRUBIN TOTAL: 0.8 mg/dL (ref 0.3–1.2)
BUN: 8 mg/dL (ref 6–20)
CO2: 27 mmol/L (ref 22–32)
Calcium: 8.4 mg/dL — ABNORMAL LOW (ref 8.9–10.3)
Chloride: 98 mmol/L — ABNORMAL LOW (ref 101–111)
Creatinine, Ser: 0.8 mg/dL (ref 0.44–1.00)
GFR calc Af Amer: >60 mL/min (ref >60)
GFR calc non Af Amer: >60 mL/min (ref >60)
GLUCOSE: 121 mg/dL — AB (ref 65–99)
POTASSIUM: 3.7 mmol/L (ref 3.5–5.1)
Sodium: 135 mmol/L (ref 135–145)
TOTAL PROTEIN: 6 g/dL — AB (ref 6.5–8.1)

## 2015-11-26 LAB — CBC
HEMATOCRIT: 29.8 % — AB (ref 35.0–47.0)
Hemoglobin: 10.3 g/dL — ABNORMAL LOW (ref 12.0–16.0)
MCH: 29.1 pg (ref 26.0–34.0)
MCHC: 34.6 g/dL (ref 32.0–36.0)
MCV: 84.1 fL (ref 80.0–100.0)
PLATELETS: 181 10*3/uL (ref 150–440)
RBC: 3.54 MIL/uL — ABNORMAL LOW (ref 3.80–5.20)
RDW: 13.9 % (ref 11.5–14.5)
WBC: 4.6 10*3/uL (ref 3.6–11.0)

## 2015-12-02 DIAGNOSIS — D649 Anemia, unspecified: Secondary | ICD-10-CM | POA: Diagnosis not present

## 2015-12-02 LAB — URINALYSIS COMPLETE WITH MICROSCOPIC (ARMC ONLY)
BILIRUBIN URINE: NEGATIVE
Bacteria, UA: NONE SEEN
Glucose, UA: NEGATIVE mg/dL
Hgb urine dipstick: NEGATIVE
KETONES UR: NEGATIVE mg/dL
NITRITE: NEGATIVE
PH: 7 (ref 5.0–8.0)
Protein, ur: NEGATIVE mg/dL
Specific Gravity, Urine: 1.01 (ref 1.005–1.030)

## 2015-12-03 DIAGNOSIS — Z96641 Presence of right artificial hip joint: Secondary | ICD-10-CM | POA: Diagnosis not present

## 2015-12-03 DIAGNOSIS — S72451D Displaced supracondylar fracture without intracondylar extension of lower end of right femur, subsequent encounter for closed fracture with routine healing: Secondary | ICD-10-CM | POA: Diagnosis not present

## 2015-12-03 LAB — URINE CULTURE

## 2015-12-10 ENCOUNTER — Encounter (HOSPITAL_COMMUNITY): Payer: Self-pay | Admitting: Orthopedic Surgery

## 2015-12-11 ENCOUNTER — Non-Acute Institutional Stay (SKILLED_NURSING_FACILITY): Payer: Medicare Other | Admitting: Gerontology

## 2015-12-11 DIAGNOSIS — Z9660 Presence of unspecified orthopedic joint implant: Secondary | ICD-10-CM | POA: Diagnosis not present

## 2015-12-11 DIAGNOSIS — Z96649 Presence of unspecified artificial hip joint: Secondary | ICD-10-CM

## 2015-12-11 NOTE — Progress Notes (Signed)
Location:      Place of Service:  SNF (31)  Provider: Toni Arthurs, NP-C  PCP: [name], MD Patient Care Team: [name], MD as PCP - General (Internal Medicine)  Extended Emergency Contact Information Primary Emergency Contact: Kelton Pillar, [place] [address] [place] of [place] Phone: [phone] Relation: Daughter Secondary Emergency Contact: Rachel Moulds, [place] [address] [place] of [place] Phone: [phone] Relation: Son  Code Status: Full Goals of care:  Advanced Directive information Advanced Directives 11/18/2015  Does patient have an advance directive? No  Type of Advance Directive -  Does patient want to make changes to advanced directive? -  Copy of advanced directive(s) in chart? -  Would patient like information on creating an advanced directive? -     Allergies  Allergen Reactions  . No Known Allergies     Chief Complaint  Patient presents with  . Discharge Note    HPI:  60 y.o. female was admitted for rehab S/P right total hip revision and right femur fracture repair that was sustained during the hip surgery.Resident received PT/OT for decreased strengthening and mobility. Improvement was noted w/mobility w/walker. Stable. Pt ready for discharge    Past Medical History:  Diagnosis Date  . Anemia   . Anxiety   . Arthritis   . Barrett esophagus   . Cancer ([place]) 2016    Skin cancer right leg mylenoma  . Depression   . GERD (gastroesophageal reflux disease)   . History of blood transfusion    with a surgery  . History of bronchitis   . History of pneumonia   . Hypertension   . IBS (irritable bowel syndrome)   . Pneumonia 09/27/15  . PTSD (post-traumatic stress disorder)   . Renal insufficiency    reports acute kidney injury  . Seizures ([place])    rare - last one 07/30/14 - was very anemic.  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 Vertigo    thinks related to sinus issues    Past Surgical  History:  Procedure Laterality Date  . ABDOMINAL HYSTERECTOMY    . CESAREAN SECTION    . COLONOSCOPY N/A 08/31/2014   Procedure: COLONOSCOPY;  Surgeon: Lucilla Lame, MD;  Location: [facility];  Service: Gastroenterology;  Laterality: N/A;  . CYST EXCISION  2011   from throat  . ESOPHAGOGASTRODUODENOSCOPY N/A 08/31/2014   Procedure: ESOPHAGOGASTRODUODENOSCOPY (EGD);  Surgeon: Lucilla Lame, MD;  Location: [facility];  Service: Gastroenterology;  Laterality: N/A;  . HEMORRHOID SURGERY    . INCISION AND DRAINAGE HIP Right 11/25/2014   Procedure: IRRIGATION  AND DRAINAGEOF RIGHT HIP WITH PLACEMENT OF ANTIBIOUTIC BEADS;  Surgeon: Mcarthur Rossetti, MD;  Location: [facility];  Service: Orthopedics;  Laterality: Right;  . INCISION AND DRAINAGE HIP Right 11/28/2014   Procedure: Repeat I&D Right Hip;  Surgeon: Mcarthur Rossetti, MD;  Location: [facility];  Service: Orthopedics;  Laterality: Right;  . JOINT REPLACEMENT Right 2007   hip  . TOTAL HIP REVISION Right 03/19/2015   Procedure: RIGHT TOTAL HIP ARTHROPLASTY REVISION;  Surgeon: Meredith Pel, MD;  Location: [facility];  Service: Orthopedics;  Laterality: Right;  . TOTAL HIP REVISION Right 11/18/2015   Procedure: TOTAL HIP REVISION;  Surgeon: Frederik Pear, MD;  Location: [facility];  Service: Orthopedics;  Laterality: Right;      reports that she has never smoked.  She has never used smokeless tobacco. She reports that she does not drink alcohol or use drugs. Social History   Social History  . Marital status: Single    Spouse name: N/A  . Number of children: N/A  . Years of education: N/A   Occupational History  . Not on file.   Social History Main Topics  . Smoking status: Never Smoker  . Smokeless tobacco: Never Used  . Alcohol use No  . Drug use: No  . Sexual activity: Not on file   Other Topics Concern  . Not on file   Social History Narrative  . No narrative on file   Functional Status Survey:    Allergies    Allergen Reactions  . No Known Allergies     Pertinent  Health Maintenance Due  Topic Date Due  . PAP SMEAR  04/20/1978  . MAMMOGRAM  04/21/2007  . INFLUENZA VACCINE  01/26/2016 (Originally 11/26/2015)  . COLONOSCOPY  08/30/2024    Medications:   Medication List       Accurate as of 12/11/15  4:20 PM. Always use your most recent med list.          acetaminophen 500 MG tablet Commonly known as:  TYLENOL Take 1,000 mg by mouth 2 (two) times daily as needed for mild pain or headache.   ALPRAZolam 1 MG tablet Commonly known as:  XANAX Take 1 mg by mouth 3 (three) times daily as needed for anxiety.   aspirin EC 325 MG tablet Take 1 tablet (325 mg total) by mouth 2 (two) times daily.   atenolol 50 MG tablet Commonly known as:  TENORMIN Take 50 mg by mouth 2 (two) times daily.   baclofen 10 MG tablet Commonly known as:  LIORESAL Take 10 mg by mouth 3 (three) times daily.   carbamazepine 200 MG tablet Commonly known as:  TEGRETOL Take 300 mg by mouth 2 (two) times daily.   escitalopram 20 MG tablet Commonly known as:  LEXAPRO Take 20 mg by mouth daily.   hyoscyamine 0.125 MG tablet Commonly known as:  LEVSIN, ANASPAZ Take 1 tablet (0.125 mg total) by mouth 4 (four) times daily as needed for cramping.   mirtazapine 15 MG tablet Commonly known as:  REMERON Take 15 mg by mouth at bedtime.   ondansetron 8 MG tablet Commonly known as:  ZOFRAN Take 8 mg by mouth 2 (two) times daily as needed for nausea or vomiting.   oxyCODONE-acetaminophen 5-325 MG tablet Commonly known as:  ROXICET Take 1 tablet by mouth every 4 (four) hours as needed.   pantoprazole 40 MG tablet Commonly known as:  PROTONIX Take 40 mg by mouth daily.   traZODone 50 MG tablet Commonly known as:  DESYREL Take 50 mg by mouth at bedtime.       Review of Systems  Constitutional: Negative for activity change, appetite change, chills, diaphoresis and fever.  HENT: Negative for congestion,  sneezing, sore throat, trouble swallowing and voice change.   Respiratory: Negative for apnea, cough, choking, chest tightness, shortness of breath and wheezing.   Cardiovascular: Negative for chest pain, palpitations and leg swelling.  Gastrointestinal: Negative for abdominal distention, abdominal pain, constipation, diarrhea and nausea.  Genitourinary: Negative for difficulty urinating, dysuria, frequency and urgency.  Musculoskeletal: Positive for arthralgias (typical arthritis). Negative for back pain, gait problem and myalgias.  Skin: Positive for wound (incision). Negative for color change, pallor and rash.  Neurological: Negative for dizziness, tremors, syncope, speech difficulty, weakness, numbness  and headaches.  Psychiatric/Behavioral: Negative for agitation and behavioral problems.  All other systems reviewed and are negative.   Vitals:   12/11/15 1556  BP: (!) 142/71  Pulse: 63  Resp: 18  Temp: 97.8 F (36.6 C)  SpO2: 99%  Weight: 192 lb (87.1 kg)   Body mass index is 36.28 kg/m. Physical Exam  Constitutional: She is oriented to person, place, and time. Vital signs are normal. She appears well-developed and well-nourished. She is active and cooperative. She does not appear ill. No distress.  HENT:  Head: Normocephalic and atraumatic.  Mouth/Throat: Uvula is midline, oropharynx is clear and moist and mucous membranes are normal. Mucous membranes are not pale, not dry and not cyanotic.  Eyes: Conjunctivae, EOM and lids are normal. Pupils are equal, round, and reactive to light.  Neck: Trachea normal, normal range of motion and full passive range of motion without pain. Neck supple. No JVD present. No tracheal deviation, no edema and no erythema present. No thyromegaly present.  Cardiovascular: Normal rate, regular rhythm, normal heart sounds, intact distal pulses and normal pulses.  Exam reveals no gallop and no distant heart sounds.   Pulmonary/Chest: Effort normal and  breath sounds normal. No accessory muscle usage. No respiratory distress. She has no wheezes. She has no rales. She exhibits no tenderness.  Abdominal: Normal appearance and bowel sounds are normal. She exhibits no distension and no ascites. There is no tenderness. There is no guarding.  Musculoskeletal: She exhibits no edema.       Right hip: She exhibits decreased range of motion, decreased strength and tenderness.  Expected osteoarthritis, stiffness; calves soft, supple. Negative homan's sign  Neurological: She is alert and oriented to person, place, and time. She has normal strength.  Skin: Skin is warm and dry. Laceration (long incision right hip/femur to knee. Well approximated, ) noted. No rash noted. She is not diaphoretic. No cyanosis or erythema. No pallor. Nails show no clubbing.  Psychiatric: She has a normal mood and affect. Her speech is normal and behavior is normal. Judgment and thought content normal. Cognition and memory are normal.  Nursing note and vitals reviewed.   Labs reviewed: Basic Metabolic Panel:  Recent Labs  11/08/15 1347 11/18/15 1052 11/18/15 1751 11/26/15 1033  NA 128* 126* 133* 135  K 3.9 4.2 4.1 3.7  CL 94* 94*  --  98*  CO2 26 23  --  27  GLUCOSE 102* 105*  --  121*  BUN 12 10  --  8  CREATININE 0.78 0.79  --  0.80  CALCIUM 9.3 9.2  --  8.4*   Liver Function Tests:  Recent Labs  02/18/15 1121 11/26/15 1033  AST 16 25  ALT 15 16  ALKPHOS 98 80  BILITOT 0.4 0.8  PROT 7.3 6.0*  ALBUMIN 4.2 3.1*   No results for input(s): LIPASE, AMYLASE in the last 8760 hours. No results for input(s): AMMONIA in the last 8760 hours. CBC:  Recent Labs  03/20/15 0545  09/05/15 1547 11/08/15 1347  11/20/15 0634 11/21/15 0520 11/22/15 0805 11/26/15 1033  WBC 10.3  < > 5.3 6.0  < > 8.9 6.1  --  4.6  NEUTROABS 8.9*  --  3.4 3.9  --   --   --   --   --   HGB 7.0*  < > 11.0* 9.6*  < > 7.8* 6.9* 9.2* 10.3*  HCT 20.5*  < > 33.1* 29.4*  < > 23.2* 20.8*  27.0* 29.8*  MCV 78.5  < > 80.1 83.5  < > 82.3 83.5  --  84.1  PLT 170  < > 136* 165  < > 94* 91*  --  181  < > = values in this interval not displayed. Cardiac Enzymes: No results for input(s): CKTOTAL, CKMB, CKMBINDEX, TROPONINI in the last 8760 hours. BNP: Invalid input(s): POCBNP CBG: No results for input(s): GLUCAP in the last 8760 hours.  Procedures and Imaging Studies During Stay: Dg C-arm Gt 23 Min  Result Date: 11/18/2015 CLINICAL DATA:  Right hip arthroplasty and femur fracture fixation. EXAM: DG C-ARM GT 120 MIN; RIGHT FEMUR 2 VIEWS COMPARISON:  CT scan 08/06/2015 FINDINGS: There is a long stem femoral prosthesis in good position without complicating features. There is also a long sideplate with cervical arch wires and distal interlocking screws. Good position and alignment without complicating features. IMPRESSION: Long stem femoral prosthesis and acetabular components in good position without complicating features. Long lateral sideplate with fixing cerclage wires and distal screws. Electronically Signed   By: Marijo Sanes M.D.   On: 11/18/2015 18:19  Dg Femur, Min 2 Views Right  Result Date: 11/18/2015 CLINICAL DATA:  Right hip arthroplasty and femur fracture fixation. EXAM: DG C-ARM GT 120 MIN; RIGHT FEMUR 2 VIEWS COMPARISON:  CT scan 08/06/2015 FINDINGS: There is a long stem femoral prosthesis in good position without complicating features. There is also a long sideplate with cervical arch wires and distal interlocking screws. Good position and alignment without complicating features. IMPRESSION: Long stem femoral prosthesis and acetabular components in good position without complicating features. Long lateral sideplate with fixing cerclage wires and distal screws. Electronically Signed   By: Marijo Sanes M.D.   On: 11/18/2015 18:19   Assessment/Plan:   1. S/P revision of total hip Mrs. Twiddy was admitted w/dx: S/P right total hip revision and right femur fracture repair  that was sustained during the hip surgery. Resident was d/c to home with daughter/(Meredith}. D/c prescriptions / teachings were given to resident and family. Resident stated the information was understood. Personal belongings were packed and handed over to family. Resident received PT/OT for decreased strengthening and mobility. Improvement was noted w/mobility w/walker. Stable. Continue home PT/OT per therapy instructions. Hip precautions    Patient is being discharged with the following home health services:  none  Patient is being discharged with the following durable medical equipment: none   Patient has been advised to f/u with their PCP in 1-2 weeks to bring them up to date on their rehab stay.  Social services at facility was responsible for arranging this appointment.  Pt was provided with a 30 day supply of prescriptions for medications and refills must be obtained from their PCP.  For controlled substances, a more limited supply may be provided adequate until PCP appointment only.  Future labs/tests needed:  Per PCP  Family/ staff Communication:   Total Time: 25 minutes  Documentation: 15 minutes  Face to Face: 10 minutes  Family/Phone:  Vikki Ports, NP-C [facility] [address], [place] [address] Cell Phone (Mon-Fri 8am-5pm):  [phone] On Call:  [phone] & follow prompts after 5pm & weekends Office Phone:  [phone] Office Fax:  [phone]

## 2016-01-14 DIAGNOSIS — Z9889 Other specified postprocedural states: Secondary | ICD-10-CM | POA: Diagnosis not present

## 2016-01-14 DIAGNOSIS — M1611 Unilateral primary osteoarthritis, right hip: Secondary | ICD-10-CM | POA: Diagnosis not present

## 2016-01-14 DIAGNOSIS — Z96641 Presence of right artificial hip joint: Secondary | ICD-10-CM | POA: Diagnosis not present

## 2016-04-23 DIAGNOSIS — I10 Essential (primary) hypertension: Secondary | ICD-10-CM | POA: Diagnosis not present

## 2016-04-23 DIAGNOSIS — M545 Low back pain: Secondary | ICD-10-CM | POA: Diagnosis not present

## 2016-04-23 DIAGNOSIS — R269 Unspecified abnormalities of gait and mobility: Secondary | ICD-10-CM | POA: Diagnosis not present

## 2016-05-08 DIAGNOSIS — Z79899 Other long term (current) drug therapy: Secondary | ICD-10-CM | POA: Diagnosis not present

## 2016-05-08 DIAGNOSIS — E538 Deficiency of other specified B group vitamins: Secondary | ICD-10-CM | POA: Diagnosis not present

## 2016-05-08 DIAGNOSIS — E559 Vitamin D deficiency, unspecified: Secondary | ICD-10-CM | POA: Diagnosis not present

## 2016-05-19 ENCOUNTER — Other Ambulatory Visit: Payer: Self-pay | Admitting: Neurology

## 2016-05-19 DIAGNOSIS — R569 Unspecified convulsions: Secondary | ICD-10-CM

## 2016-05-19 DIAGNOSIS — Z79899 Other long term (current) drug therapy: Secondary | ICD-10-CM | POA: Diagnosis not present

## 2016-05-20 ENCOUNTER — Ambulatory Visit: Payer: Medicare Other

## 2016-06-11 DIAGNOSIS — E039 Hypothyroidism, unspecified: Secondary | ICD-10-CM | POA: Diagnosis not present

## 2016-06-11 DIAGNOSIS — Z0001 Encounter for general adult medical examination with abnormal findings: Secondary | ICD-10-CM | POA: Diagnosis not present

## 2016-06-11 DIAGNOSIS — M25551 Pain in right hip: Secondary | ICD-10-CM | POA: Diagnosis not present

## 2016-07-07 DIAGNOSIS — Z1231 Encounter for screening mammogram for malignant neoplasm of breast: Secondary | ICD-10-CM | POA: Diagnosis not present

## 2016-07-07 DIAGNOSIS — Z780 Asymptomatic menopausal state: Secondary | ICD-10-CM | POA: Diagnosis not present

## 2016-08-03 ENCOUNTER — Observation Stay
Admission: EM | Admit: 2016-08-03 | Discharge: 2016-08-05 | Disposition: A | Discharge status: Home / Self Care | Payer: Medicare Other | Attending: Internal Medicine | Admitting: Internal Medicine

## 2016-08-03 DIAGNOSIS — F4310 Post-traumatic stress disorder, unspecified: Secondary | ICD-10-CM | POA: Diagnosis not present

## 2016-08-03 DIAGNOSIS — M1990 Unspecified osteoarthritis, unspecified site: Secondary | ICD-10-CM | POA: Diagnosis not present

## 2016-08-03 DIAGNOSIS — Z85828 Personal history of other malignant neoplasm of skin: Secondary | ICD-10-CM | POA: Insufficient documentation

## 2016-08-03 DIAGNOSIS — E785 Hyperlipidemia, unspecified: Secondary | ICD-10-CM | POA: Diagnosis not present

## 2016-08-03 DIAGNOSIS — E871 Hypo-osmolality and hyponatremia: Secondary | ICD-10-CM | POA: Diagnosis not present

## 2016-08-03 DIAGNOSIS — I10 Essential (primary) hypertension: Secondary | ICD-10-CM | POA: Diagnosis not present

## 2016-08-03 DIAGNOSIS — K640 First degree hemorrhoids: Secondary | ICD-10-CM | POA: Insufficient documentation

## 2016-08-03 DIAGNOSIS — K219 Gastro-esophageal reflux disease without esophagitis: Secondary | ICD-10-CM | POA: Insufficient documentation

## 2016-08-03 DIAGNOSIS — K648 Other hemorrhoids: Secondary | ICD-10-CM | POA: Diagnosis not present

## 2016-08-03 DIAGNOSIS — K589 Irritable bowel syndrome without diarrhea: Secondary | ICD-10-CM | POA: Insufficient documentation

## 2016-08-03 DIAGNOSIS — G40909 Epilepsy, unspecified, not intractable, without status epilepticus: Secondary | ICD-10-CM | POA: Insufficient documentation

## 2016-08-03 DIAGNOSIS — F329 Major depressive disorder, single episode, unspecified: Secondary | ICD-10-CM | POA: Diagnosis not present

## 2016-08-03 DIAGNOSIS — R569 Unspecified convulsions: Secondary | ICD-10-CM | POA: Diagnosis not present

## 2016-08-03 DIAGNOSIS — F419 Anxiety disorder, unspecified: Secondary | ICD-10-CM | POA: Diagnosis not present

## 2016-08-03 DIAGNOSIS — Z7982 Long term (current) use of aspirin: Secondary | ICD-10-CM | POA: Insufficient documentation

## 2016-08-03 DIAGNOSIS — K625 Hemorrhage of anus and rectum: Secondary | ICD-10-CM | POA: Diagnosis present

## 2016-08-03 DIAGNOSIS — D509 Iron deficiency anemia, unspecified: Secondary | ICD-10-CM | POA: Diagnosis not present

## 2016-08-03 LAB — COMPREHENSIVE METABOLIC PANEL
ALT: 13 U/L — ABNORMAL LOW (ref 14–54)
ANION GAP: 7 (ref 5–15)
AST: 20 U/L (ref 15–41)
Albumin: 4.3 g/dL (ref 3.5–5.0)
Alkaline Phosphatase: 70 U/L (ref 38–126)
BUN: 13 mg/dL (ref 6–20)
CHLORIDE: 93 mmol/L — AB (ref 101–111)
CO2: 27 mmol/L (ref 22–32)
Calcium: 9.3 mg/dL (ref 8.9–10.3)
Creatinine, Ser: 0.89 mg/dL (ref 0.44–1.00)
GFR calc non Af Amer: >60 mL/min (ref >60)
Glucose, Bld: 113 mg/dL — ABNORMAL HIGH (ref 65–99)
Potassium: 4.5 mmol/L (ref 3.5–5.1)
SODIUM: 127 mmol/L — AB (ref 135–145)
Total Bilirubin: 0.5 mg/dL (ref 0.3–1.2)
Total Protein: 7.1 g/dL (ref 6.5–8.1)

## 2016-08-03 LAB — CBC
HEMATOCRIT: 25.7 % — AB (ref 35.0–47.0)
HEMOGLOBIN: 8.5 g/dL — AB (ref 12.0–16.0)
MCH: 26.8 pg (ref 26.0–34.0)
MCHC: 33 g/dL (ref 32.0–36.0)
MCV: 81.2 fL (ref 80.0–100.0)
Platelets: 169 10*3/uL (ref 150–440)
RBC: 3.17 MIL/uL — AB (ref 3.80–5.20)
RDW: 15.2 % — ABNORMAL HIGH (ref 11.5–14.5)
WBC: 5.5 10*3/uL (ref 3.6–11.0)

## 2016-08-03 LAB — HEMOGLOBIN: HEMOGLOBIN: 8.5 g/dL — AB (ref 12.0–16.0)

## 2016-08-03 LAB — ABO/RH: ABO/RH(D): O NEG

## 2016-08-03 LAB — PROTIME-INR
INR: 1.09
Prothrombin Time: 14.1 seconds (ref 11.4–15.2)

## 2016-08-03 MED ORDER — CYCLOBENZAPRINE HCL 10 MG PO TABS: 10.0000 mg | ORAL_TABLET | Freq: Two times a day (BID) | ORAL | Status: DC | PRN

## 2016-08-03 MED ORDER — ONDANSETRON HCL 4 MG/2ML IJ SOLN: 4.0000 mg | Freq: Four times a day (QID) | INTRAMUSCULAR | Status: DC | PRN

## 2016-08-03 MED ORDER — ACETAMINOPHEN 325 MG PO TABS: 650.0000 mg | ORAL_TABLET | Freq: Four times a day (QID) | ORAL | Status: DC | PRN

## 2016-08-03 MED ORDER — HYOSCYAMINE SULFATE 0.125 MG PO TABS
0.1250 mg | ORAL_TABLET | Freq: Four times a day (QID) | ORAL | Status: DC | PRN
  Filled 2016-08-03: qty 1

## 2016-08-03 MED ORDER — MIRTAZAPINE 15 MG PO TABS
30.0000 mg | ORAL_TABLET | Freq: Every day | ORAL | Status: DC
Start: 2016-08-03 — End: 2016-08-05
  Administered 2016-08-03 – 2016-08-04 (×2): 30 mg via ORAL
  Filled 2016-08-03 (×2): qty 2

## 2016-08-03 MED ORDER — SODIUM CHLORIDE 0.9 % IV SOLN
Freq: Once | INTRAVENOUS | Status: AC
  Administered 2016-08-03: 17:00:00 via INTRAVENOUS

## 2016-08-03 MED ORDER — TRAZODONE HCL 50 MG PO TABS
50.0000 mg | ORAL_TABLET | Freq: Every day | ORAL | Status: DC
  Administered 2016-08-03 – 2016-08-04 (×2): 50 mg via ORAL
  Filled 2016-08-03 (×2): qty 1

## 2016-08-03 MED ORDER — LEVETIRACETAM 500 MG PO TABS
500.0000 mg | ORAL_TABLET | Freq: Two times a day (BID) | ORAL | Status: DC
  Administered 2016-08-03 – 2016-08-05 (×4): 500 mg via ORAL
  Filled 2016-08-03 (×4): qty 1

## 2016-08-03 MED ORDER — ALPRAZOLAM 1 MG PO TABS
1.0000 mg | ORAL_TABLET | Freq: Three times a day (TID) | ORAL | Status: DC | PRN
  Administered 2016-08-04: 1 mg via ORAL
  Filled 2016-08-03: qty 1

## 2016-08-03 MED ORDER — ATENOLOL 50 MG PO TABS
50.0000 mg | ORAL_TABLET | Freq: Two times a day (BID) | ORAL | Status: DC
Start: 2016-08-03 — End: 2016-08-05
  Administered 2016-08-03 – 2016-08-05 (×4): 50 mg via ORAL
  Filled 2016-08-03 (×4): qty 1

## 2016-08-03 MED ORDER — ACETAMINOPHEN 650 MG RE SUPP: 650.0000 mg | Freq: Four times a day (QID) | RECTAL | Status: DC | PRN

## 2016-08-03 MED ORDER — CARBAMAZEPINE 200 MG PO TABS
300.0000 mg | ORAL_TABLET | Freq: Two times a day (BID) | ORAL | Status: DC
  Administered 2016-08-03 – 2016-08-05 (×4): 300 mg via ORAL
  Filled 2016-08-03 (×2): qty 2
  Filled 2016-08-03: qty 1.5
  Filled 2016-08-03: qty 2
  Filled 2016-08-03: qty 1.5

## 2016-08-03 MED ORDER — ESCITALOPRAM OXALATE 10 MG PO TABS
20.0000 mg | ORAL_TABLET | Freq: Every day | ORAL | Status: DC
  Administered 2016-08-04 – 2016-08-05 (×2): 20 mg via ORAL
  Filled 2016-08-03 (×2): qty 2

## 2016-08-03 MED ORDER — ONDANSETRON HCL 4 MG PO TABS: 4.0000 mg | ORAL_TABLET | Freq: Four times a day (QID) | ORAL | Status: DC | PRN

## 2016-08-03 MED ORDER — PANTOPRAZOLE SODIUM 40 MG PO TBEC
40.0000 mg | DELAYED_RELEASE_TABLET | Freq: Every day | ORAL | Status: DC
  Administered 2016-08-04 – 2016-08-05 (×2): 40 mg via ORAL
  Filled 2016-08-03 (×2): qty 1

## 2016-08-03 NOTE — H&P (Signed)
[facility] NAME: Christina French    MR#:  [id number]  DATE OF BIRTH:  14-Apr-1956  DATE OF ADMISSION:  08/03/2016  PRIMARY CARE PHYSICIAN: [name], MD   REQUESTING/REFERRING PHYSICIAN: Dr. Harvest Dark  CHIEF COMPLAINT:   Chief Complaint  Patient presents with  . Rectal Bleeding    HISTORY OF PRESENT ILLNESS:  Christina French  is a 61 y.o. female with a known history of Seizures, vertigo, PTSD, irritable bowel syndrome, hypertension, GERD, history of Barrett's esophagus, depression who presents to the hospital due to rectal bleeding. Patient says she's been having spotting in her stool now for the past few weeks but overnight she developed multiple episodes of rectal bleeding including some clots that she passed this morning. She was a bit concerned therefore came to the ER for further evaluation. She also complains of some exertional shortness of breath but denies any chest pain, abdominal pain, nausea vomiting or any other associated symptoms presently. Patient's hemoglobin presently is 8.5. His hemoglobin last year was 10. Given ongoing rectal bleeding hospitalist services were contacted further treatment and evaluation.  PAST MEDICAL HISTORY:   Past Medical History:  Diagnosis Date  . Anemia   . Anxiety   . Arthritis   . Barrett esophagus   . Cancer ([place]) 2016    Skin cancer right leg mylenoma  . Depression   . GERD (gastroesophageal reflux disease)   . History of blood transfusion    with a surgery  . History of bronchitis   . History of pneumonia   . Hypertension   . IBS (irritable bowel syndrome)   . Pneumonia 09/27/15  . PTSD (post-traumatic stress disorder)   . Renal insufficiency    reports acute kidney injury  . Seizures ([place])    rare - last one 07/30/14 - was very anemic.  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 Vertigo    thinks related to sinus issues    PAST SURGICAL HISTORY:   Past Surgical History:  Procedure  Laterality Date  . ABDOMINAL HYSTERECTOMY    . CESAREAN SECTION    . COLONOSCOPY N/A 08/31/2014   Procedure: COLONOSCOPY;  Surgeon: Lucilla Lame, MD;  Location: [place];  Service: Gastroenterology;  Laterality: N/A;  . CYST EXCISION  2011   from throat  . ESOPHAGOGASTRODUODENOSCOPY N/A 08/31/2014   Procedure: ESOPHAGOGASTRODUODENOSCOPY (EGD);  Surgeon: Lucilla Lame, MD;  Location: [place];  Service: Gastroenterology;  Laterality: N/A;  . HEMORRHOID SURGERY    . INCISION AND DRAINAGE HIP Right 11/25/2014   Procedure: IRRIGATION  AND DRAINAGEOF RIGHT HIP WITH PLACEMENT OF ANTIBIOUTIC BEADS;  Surgeon: Mcarthur Rossetti, MD;  Location: [place];  Service: Orthopedics;  Laterality: Right;  . INCISION AND DRAINAGE HIP Right 11/28/2014   Procedure: Repeat I&D Right Hip;  Surgeon: Mcarthur Rossetti, MD;  Location: [place];  Service: Orthopedics;  Laterality: Right;  . JOINT REPLACEMENT Right 2007   hip  . TOTAL HIP REVISION Right 03/19/2015   Procedure: RIGHT TOTAL HIP ARTHROPLASTY REVISION;  Surgeon: Meredith Pel, MD;  Location: [place];  Service: Orthopedics;  Laterality: Right;  . TOTAL HIP REVISION Right 11/18/2015   Procedure: TOTAL HIP REVISION;  Surgeon: Frederik Pear, MD;  Location: [place];  Service: Orthopedics;  Laterality: Right;    SOCIAL HISTORY:   Social History  Substance Use Topics  . Smoking status: Never Smoker  . Smokeless tobacco: Never Used  . Alcohol  use No    FAMILY HISTORY:   Family History  Problem Relation Age of Onset  . Alcoholism Father   . Throat cancer Mother     DRUG ALLERGIES:   Allergies  Allergen Reactions  . No Known Allergies     REVIEW OF SYSTEMS:   Review of Systems  Constitutional: Negative for fever and weight loss.  HENT: Negative for congestion, nosebleeds and tinnitus.   Eyes: Negative for blurred vision, double vision and redness.  Respiratory: Positive for shortness of breath. Negative for cough and  hemoptysis.   Cardiovascular: Negative for chest pain, orthopnea, leg swelling and PND.  Gastrointestinal: Positive for blood in stool. Negative for abdominal pain, diarrhea, melena, nausea and vomiting.  Genitourinary: Negative for dysuria, hematuria and urgency.  Musculoskeletal: Negative for falls and joint pain.  Neurological: Negative for dizziness, tingling, sensory change, focal weakness, seizures, weakness and headaches.  Endo/Heme/Allergies: Negative for polydipsia. Does not bruise/bleed easily.  Psychiatric/Behavioral: Negative for depression and memory loss. The patient is not nervous/anxious.     MEDICATIONS AT HOME:   Prior to Admission medications   Medication Sig Start Date End Date Taking? Authorizing Provider  acetaminophen (TYLENOL) 500 MG tablet Take 1,000 mg by mouth 2 (two) times daily as needed for mild pain or headache.   Yes Historical Provider, MD  ALPRAZolam Duanne Moron) 1 MG tablet Take 1 mg by mouth 3 (three) times daily as needed for anxiety.    Yes Historical Provider, MD  atenolol (TENORMIN) 50 MG tablet Take 50 mg by mouth 2 (two) times daily.   Yes Historical Provider, MD  carbamazepine (TEGRETOL) 200 MG tablet Take 300 mg by mouth 2 (two) times daily.    Yes Historical Provider, MD  cyclobenzaprine (FLEXERIL) 10 MG tablet Take 10 mg by mouth 2 (two) times daily as needed for muscle spasms.   Yes Historical Provider, MD  escitalopram (LEXAPRO) 20 MG tablet Take 20 mg by mouth daily.    Yes Historical Provider, MD  hyoscyamine (LEVSIN, ANASPAZ) 0.125 MG tablet Take 1 tablet (0.125 mg total) by mouth 4 (four) times daily as needed for cramping. 02/06/15  Yes Lucilla Lame, MD  levETIRAcetam (KEPPRA) 500 MG tablet Take 500 mg by mouth 2 (two) times daily. 05/08/16 05/08/17 Yes Historical Provider, MD  mirtazapine (REMERON) 30 MG tablet Take 30 mg by mouth at bedtime.    Yes Historical Provider, MD  ondansetron (ZOFRAN) 8 MG tablet Take 8 mg by mouth 2 (two) times daily as  needed for nausea or vomiting.    Yes Historical Provider, MD  pantoprazole (PROTONIX) 40 MG tablet Take 40 mg by mouth daily.   Yes Historical Provider, MD  traZODone (DESYREL) 50 MG tablet Take 50 mg by mouth at bedtime.   Yes Historical Provider, MD  aspirin EC 325 MG tablet Take 1 tablet (325 mg total) by mouth 2 (two) times daily. Patient not taking: Reported on 08/03/2016 11/20/15   Leighton Parody, PA-C  oxyCODONE-acetaminophen (ROXICET) 5-325 MG tablet Take 1 tablet by mouth every 4 (four) hours as needed. Patient not taking: Reported on 08/03/2016 11/20/15   Leighton Parody, PA-C      VITAL SIGNS:  Blood pressure (!) 151/99, pulse 77, temperature 97.8 F (36.6 C), temperature source Oral, resp. rate 19, height 5\' 2"  (1.575 m), weight 86.2 kg (190 lb), SpO2 97 %.  PHYSICAL EXAMINATION:  Physical Exam  GENERAL:  61 y.o.-year-old patient lying in bed in no acute distress.  EYES: Pupils equal,  round, reactive to light and accommodation. No scleral icterus. Extraocular muscles intact.  HEENT: Head atraumatic, normocephalic. Oropharynx and nasopharynx clear. No oropharyngeal erythema, moist oral mucosa  NECK:  Supple, no jugular venous distention. No thyroid enlargement, no tenderness.  LUNGS: Normal breath sounds bilaterally, no wheezing, rales, rhonchi. No use of accessory muscles of respiration.  CARDIOVASCULAR: S1, S2 RRR. No murmurs, rubs, gallops, clicks.  ABDOMEN: Soft, nontender, nondistended. Bowel sounds present. No organomegaly or mass.  EXTREMITIES: No pedal edema, cyanosis, or clubbing. + 2 pedal & radial pulses b/l.   NEUROLOGIC: Cranial nerves II through XII are intact. No focal Motor or sensory deficits appreciated b/l PSYCHIATRIC: The patient is alert and oriented x 3. SKIN: No obvious rash, lesion, or ulcer.   LABORATORY PANEL:   CBC  Recent Labs Lab 08/03/16 1340  WBC 5.5  HGB 8.5*  HCT 25.7*  PLT 169    ------------------------------------------------------------------------------------------------------------------  Chemistries   Recent Labs Lab 08/03/16 1340  NA 127*  K 4.5  CL 93*  CO2 27  GLUCOSE 113*  BUN 13  CREATININE 0.89  CALCIUM 9.3  AST 20  ALT 13*  ALKPHOS 70  BILITOT 0.5   ------------------------------------------------------------------------------------------------------------------  Cardiac Enzymes No results for input(s): TROPONINI in the last 168 hours. ------------------------------------------------------------------------------------------------------------------  RADIOLOGY:  No results found.   IMPRESSION AND PLAN:   61 year old female with past medical history of GERD, Barrett's esophagus, anxiety/depression, PTSD, seizures, hypertension, irritable bowel syndrome presents to the hospital due to rectal bleeding.  1. Rectal bleeding-unclear source of the rectal bleeding presently. Questionable hemorrhoidal versus diverticulosis. -Hemoglobin stable, and will follow serial hemoglobins. Patient is not on any blood thinners. -Place on clear liquid diet, get a gastroenterology consult.  2. Anxiety/depression-continue Xanax, Remeron, Lexapro.  3. History of seizures-continue carbamazepine.  4. GERD-continue Protonix.  5. Essential hypertension-hemodynamically stable. Continue atenolol.  6. History of renal bowel syndrome-continue hyoscyamine.    All the records are reviewed and case discussed with ED provider. Management plans discussed with the patient, family and they are in agreement.  CODE STATUS: Full code  TOTAL TIME TAKING CARE OF THIS PATIENT: 40 minutes.    Henreitta Leber M.D on 08/03/2016 at 5:12 PM  Between 7am to 6pm - Pager - [phone]  After 6pm go to [email or web address] - password [email or web address] [facility]  Office  [phone]  CC: Primary care physician; [name], MD

## 2016-08-03 NOTE — ED Triage Notes (Signed)
Pt reports that she has internal hemorrhoids that have caused her to bleed rectally on and off for years - this time the rectal bleeding started yesterday In large amounts - the pt reports that she has had to have blood transfusions for the blood lose before and that she is feeling the same way again - pt reports shortness of breath in walking short distances and with exertion

## 2016-08-03 NOTE — ED Notes (Signed)
Blue top sent. 

## 2016-08-03 NOTE — ED Provider Notes (Signed)
[facility] Emergency Department Provider Note  Time seen: 4:28 PM  I have reviewed the triage vital signs and the nursing notes.   HISTORY  Chief Complaint Rectal Bleeding    HPI Christina French is a 61 y.o. female with a past medical history of anemia, arthritis, GERD, hypertension, IBS, prior rectal bleeds who presents to the emergency department rectal bleeding and shortness of breath. According to the patient she has a history of internal and external hemorrhoids with bleeding at times. Patient states over the past several months she has had mild bleeding with a bowel movement that over the past 2 or 3 days she has had significant increase in the amount of bleeding, and at times feels like she needs to have a bowel movement but only blood comes out. She also notes over the past 2 days increased shortness of breath with minimal exertion. Denies any chest pain. Denies any nausea vomiting or abdominal pain. Patient states she has required blood transfusions in the past due to rectal bleeding.  Past Medical History:  Diagnosis Date  . Anemia   . Anxiety   . Arthritis   . Barrett esophagus   . Cancer ([place]) 2016    Skin cancer right leg mylenoma  . Depression   . GERD (gastroesophageal reflux disease)   . History of blood transfusion    with a surgery  . History of bronchitis   . History of pneumonia   . Hypertension   . IBS (irritable bowel syndrome)   . Pneumonia 09/27/15  . PTSD (post-traumatic stress disorder)   . Renal insufficiency    reports acute kidney injury  . Seizures ([place])    rare - last one 07/30/14 - was very anemic.  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 Vertigo    thinks related to sinus issues    Patient Active Problem List   Diagnosis Date Noted  . 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 Anemia 04/16/2015  . Anxiety disorder 03/20/2015  . Acute hyperglycemia 03/20/2015  . Pulse irregularity 03/20/2015  . Dehydration 03/20/2015  . Septic  arthritis ([place])   . Prosthetic hip infection ([place])   . Staphylococcus aureus infection   . Enteritis due to Clostridium difficile   . Screen for STD (sexually transmitted disease)   . Acute pain of right hip; hip infection 11/24/2014  . Right hip pain 11/24/2014  . Chronic hyponatremia 11/24/2014  . Seizure disorder ([place]) 08/17/2013  . Digestive disorder 08/17/2013  . HTN (hypertension) 08/17/2013  . HLD (hyperlipidemia) 08/17/2013    Past Surgical History:  Procedure Laterality Date  . ABDOMINAL HYSTERECTOMY    . CESAREAN SECTION    . COLONOSCOPY N/A 08/31/2014   Procedure: COLONOSCOPY;  Surgeon: Lucilla Lame, MD;  Location: [place];  Service: Gastroenterology;  Laterality: N/A;  . CYST EXCISION  2011   from throat  . ESOPHAGOGASTRODUODENOSCOPY N/A 08/31/2014   Procedure: ESOPHAGOGASTRODUODENOSCOPY (EGD);  Surgeon: Lucilla Lame, MD;  Location: [place];  Service: Gastroenterology;  Laterality: N/A;  . HEMORRHOID SURGERY    . INCISION AND DRAINAGE HIP Right 11/25/2014   Procedure: IRRIGATION  AND DRAINAGEOF RIGHT HIP WITH PLACEMENT OF ANTIBIOUTIC BEADS;  Surgeon: Mcarthur Rossetti, MD;  Location: [place];  Service: Orthopedics;  Laterality: Right;  . INCISION AND DRAINAGE HIP Right 11/28/2014   Procedure: Repeat I&D Right Hip;  Surgeon: Mcarthur Rossetti, MD;  Location: [place];  Service: Orthopedics;  Laterality: Right;  . JOINT  REPLACEMENT Right 2007   hip  . TOTAL HIP REVISION Right 03/19/2015   Procedure: RIGHT TOTAL HIP ARTHROPLASTY REVISION;  Surgeon: Meredith Pel, MD;  Location: [place];  Service: Orthopedics;  Laterality: Right;  . TOTAL HIP REVISION Right 11/18/2015   Procedure: TOTAL HIP REVISION;  Surgeon: Frederik Pear, MD;  Location: [place];  Service: Orthopedics;  Laterality: Right;    Prior to Admission medications   Medication Sig Start Date End Date Taking? Authorizing Provider  acetaminophen (TYLENOL) 500 MG tablet Take 1,000 mg by mouth 2  (two) times daily as needed for mild pain or headache.    Historical Provider, MD  ALPRAZolam Duanne Moron) 1 MG tablet Take 1 mg by mouth 3 (three) times daily as needed for anxiety.     Historical Provider, MD  aspirin EC 325 MG tablet Take 1 tablet (325 mg total) by mouth 2 (two) times daily. 11/20/15   Leighton Parody, PA-C  atenolol (TENORMIN) 50 MG tablet Take 50 mg by mouth 2 (two) times daily.    Historical Provider, MD  baclofen (LIORESAL) 10 MG tablet Take 10 mg by mouth 3 (three) times daily.    Historical Provider, MD  carbamazepine (TEGRETOL) 200 MG tablet Take 300 mg by mouth 2 (two) times daily.     Historical Provider, MD  escitalopram (LEXAPRO) 20 MG tablet Take 20 mg by mouth daily.     Historical Provider, MD  hyoscyamine (LEVSIN, ANASPAZ) 0.125 MG tablet Take 1 tablet (0.125 mg total) by mouth 4 (four) times daily as needed for cramping. 02/06/15   Lucilla Lame, MD  mirtazapine (REMERON) 15 MG tablet Take 15 mg by mouth at bedtime.     Historical Provider, MD  ondansetron (ZOFRAN) 8 MG tablet Take 8 mg by mouth 2 (two) times daily as needed for nausea or vomiting.     Historical Provider, MD  oxyCODONE-acetaminophen (ROXICET) 5-325 MG tablet Take 1 tablet by mouth every 4 (four) hours as needed. 11/20/15   Leighton Parody, PA-C  pantoprazole (PROTONIX) 40 MG tablet Take 40 mg by mouth daily.    Historical Provider, MD  traZODone (DESYREL) 50 MG tablet Take 50 mg by mouth at bedtime.    Historical Provider, MD    Allergies  Allergen Reactions  . No Known Allergies     Family History  Problem Relation Age of Onset  . Alcoholism Father   . Throat cancer Mother     Social History Social History  Substance Use Topics  . Smoking status: Never Smoker  . Smokeless tobacco: Never Used  . Alcohol use No    Review of Systems Constitutional: Negative for fever Cardiovascular: Negative for chest pain. Respiratory: Negative for shortness of breath. Gastrointestinal: Negative for  abdominal pain, positive for rectal bleeding. Neurological: Negative for headache 10-point ROS otherwise negative.  ____________________________________________   PHYSICAL EXAM:  VITAL SIGNS: ED Triage Vitals  Enc Vitals Group     BP 08/03/16 1335 (!) 143/83     Pulse Rate 08/03/16 1335 81     Resp 08/03/16 1335 16     Temp 08/03/16 1335 97.8 F (36.6 C)     Temp Source 08/03/16 1335 Oral     SpO2 08/03/16 1335 100 %     Weight 08/03/16 1336 190 lb (86.2 kg)     Height 08/03/16 1336 5\' 2"  (1.575 m)     Head Circumference --      Peak Flow --      Pain  Score 08/03/16 1335 0     Pain Loc --      Pain Edu? --      Excl. in [place]? --     Constitutional: Alert and oriented. Well appearing and in no distress. Eyes: Normal exam ENT   Head: Normocephalic and atraumatic.   Mouth/Throat: Mucous membranes are moist. Cardiovascular: Normal rate, regular rhythm. No murmur Respiratory: Normal respiratory effort without tachypnea nor retractions. Breath sounds are clear  Gastrointestinal: Soft and nontender. No distention.  Rectal examination shows small to moderate external hemorrhoids, nontender rectal exam with gross blood per rectum, pink to bright red. No dark stool noted. Musculoskeletal: Nontender with normal range of motion in all extremities.  Neurologic:  Normal speech and language. No gross focal neurologic deficits  Skin:  Skin is warm, dry and intact.  Psychiatric: Mood and affect are normal.   ____________________________________________    INITIAL IMPRESSION / ASSESSMENT AND PLAN / ED COURSE  Pertinent labs & imaging results that were available during my care of the patient were reviewed by me and considered in my medical decision making (see chart for details).  Patient presents to the emergency department with rectal bleeding. Patient has required blood transfusions in the past. On rectal examination she has gross blood per rectum pink to bright red. No melena  noted. Patient's hemoglobin is currently 8.5 down nearly 2 points from her last recorded hemoglobin. Given the patient's history as well as increase rectal bleeding with shortness of breath. Patient will be admitted to the hospital to monitor her hemoglobin and have GI consultation if needed. The patient is agreeable to this plan.  ____________________________________________   FINAL CLINICAL IMPRESSION(S) / ED DIAGNOSES  Rectal bleeding    Harvest Dark, MD 08/03/16 [phone]

## 2016-08-04 DIAGNOSIS — K625 Hemorrhage of anus and rectum: Secondary | ICD-10-CM

## 2016-08-04 DIAGNOSIS — K648 Other hemorrhoids: Secondary | ICD-10-CM | POA: Diagnosis not present

## 2016-08-04 DIAGNOSIS — D509 Iron deficiency anemia, unspecified: Secondary | ICD-10-CM | POA: Diagnosis not present

## 2016-08-04 DIAGNOSIS — R569 Unspecified convulsions: Secondary | ICD-10-CM | POA: Diagnosis not present

## 2016-08-04 DIAGNOSIS — D649 Anemia, unspecified: Secondary | ICD-10-CM | POA: Diagnosis not present

## 2016-08-04 LAB — BASIC METABOLIC PANEL
Anion gap: 8 (ref 5–15)
BUN: 13 mg/dL (ref 6–20)
CHLORIDE: 92 mmol/L — AB (ref 101–111)
CO2: 27 mmol/L (ref 22–32)
CREATININE: 0.83 mg/dL (ref 0.44–1.00)
Calcium: 9.1 mg/dL (ref 8.9–10.3)
GFR calc Af Amer: >60 mL/min (ref >60)
GLUCOSE: 125 mg/dL — AB (ref 65–99)
Potassium: 4 mmol/L (ref 3.5–5.1)
SODIUM: 127 mmol/L — AB (ref 135–145)

## 2016-08-04 LAB — IRON AND TIBC
Iron: 24 ug/dL — ABNORMAL LOW (ref 28–170)
Saturation Ratios: 5 % — ABNORMAL LOW (ref 10.4–31.8)
TIBC: 464 ug/dL — AB (ref 250–450)
UIBC: 440 ug/dL

## 2016-08-04 LAB — TSH: TSH: 4.684 u[IU]/mL — AB (ref 0.350–4.500)

## 2016-08-04 LAB — CBC
HEMATOCRIT: 27 % — AB (ref 35.0–47.0)
Hemoglobin: 9 g/dL — ABNORMAL LOW (ref 12.0–16.0)
MCH: 26.9 pg (ref 26.0–34.0)
MCHC: 33.3 g/dL (ref 32.0–36.0)
MCV: 80.7 fL (ref 80.0–100.0)
PLATELETS: 198 10*3/uL (ref 150–440)
RBC: 3.34 MIL/uL — ABNORMAL LOW (ref 3.80–5.20)
RDW: 15.2 % — AB (ref 11.5–14.5)
WBC: 6 10*3/uL (ref 3.6–11.0)

## 2016-08-04 LAB — FOLATE: FOLATE: 24 ng/mL (ref >5.9)

## 2016-08-04 LAB — FERRITIN: FERRITIN: 7 ng/mL — AB (ref 11–307)

## 2016-08-04 LAB — VITAMIN B12: VITAMIN B 12: 426 pg/mL (ref 180–914)

## 2016-08-04 LAB — HEMOGLOBIN: Hemoglobin: 8.8 g/dL — ABNORMAL LOW (ref 12.0–16.0)

## 2016-08-04 MED ORDER — PEG 3350-KCL-NA BICARB-NACL 420 G PO SOLR
4000.0000 mL | Freq: Once | ORAL | Status: AC
  Administered 2016-08-04: 4000 mL via ORAL
  Filled 2016-08-04: qty 4000

## 2016-08-04 MED ORDER — MORPHINE SULFATE (PF) 4 MG/ML IV SOLN
4.0000 mg | Freq: Once | INTRAVENOUS | Status: AC
  Administered 2016-08-04: 4 mg via INTRAVENOUS
  Filled 2016-08-04: qty 1

## 2016-08-04 MED ORDER — SODIUM CHLORIDE 0.9 % IV SOLN
INTRAVENOUS | Status: DC
  Administered 2016-08-05: 20 mL/h via INTRAVENOUS

## 2016-08-04 NOTE — Progress Notes (Signed)
Pt has received her golytely bowel prep, pt stated that she has done this multiple times in the past, plans to drink approx one glass per hour until prep is gone. Pt reports no signs of bleeding.

## 2016-08-04 NOTE — Care Management Obs Status (Signed)
[facility] NOTIFICATION   Patient Details  Name: CAMRON ESSMAN MRN: [id number] Date of Birth: 1956-01-24   Medicare Observation Status Notification Given:  Yes    Jolly [place], RN 08/04/2016, 11:22 AM

## 2016-08-04 NOTE — Progress Notes (Signed)
Shift assessment completed at 0815. Pt is awake, alert and oriented, in her street clothes, stated she felt well enough to go home. Assessment is negative, pt stated she has had no bleeding, and is tolerating clear liquids. piv #20 intact to lac, site is free of redness and swelling. Since assessment completed, Dr. Bridgett Larsson has rounded on pt, and Dr. Vicente Males has rounded on pt. Pt is able to confirm scheduled colonoscopy for tomorrow with prep later today. Pt told this writer she has internal hemorrhoids.

## 2016-08-04 NOTE — Plan of Care (Signed)
Problem: Bowel/Gastric: Goal: Will not experience complications related to bowel motility Outcome: Progressing Pt is aware of plan of care, cooperative.

## 2016-08-04 NOTE — Progress Notes (Signed)
[facility] NAME: Jaicey Sweaney    MR#:  [id number]  DATE OF BIRTH:  October 26, 1955  SUBJECTIVE:  CHIEF COMPLAINT:   Chief Complaint  Patient presents with  . Rectal Bleeding   The patient has no complaints. No rectal bleeding. She said that she had colonoscopy 2 years ago, which showed polyp. REVIEW OF SYSTEMS:  Review of Systems  Constitutional: Negative for fever and malaise/fatigue.  HENT: Negative for congestion and nosebleeds.   Eyes: Negative for blurred vision and double vision.  Respiratory: Negative for cough, shortness of breath, wheezing and stridor.   Cardiovascular: Negative for chest pain, palpitations and leg swelling.  Gastrointestinal: Negative for blood in stool, constipation, diarrhea, heartburn, melena, nausea and vomiting.  Genitourinary: Negative for dysuria and hematuria.  Musculoskeletal: Negative for back pain and joint pain.  Skin: Negative for itching and rash.  Neurological: Negative for dizziness, focal weakness, loss of consciousness, weakness and headaches.  Psychiatric/Behavioral: Negative for depression. The patient is not nervous/anxious.     DRUG ALLERGIES:   Allergies  Allergen Reactions  . No Known Allergies    VITALS:  Blood pressure 131/77, pulse 78, temperature 98.1 F (36.7 C), temperature source Oral, resp. rate 19, height 5\' 2"  (1.575 m), weight 198 lb 9.6 oz (90.1 kg), SpO2 98 %. PHYSICAL EXAMINATION:  Physical Exam  Constitutional: She is oriented to person, place, and time and well-developed, well-nourished, and in no distress.  HENT:  Head: Normocephalic.  Mouth/Throat: Oropharynx is clear and moist.  Eyes: Conjunctivae and EOM are normal.  Neck: Normal range of motion. Neck supple. No JVD present. No tracheal deviation present.  Cardiovascular: Normal rate, regular rhythm and normal heart sounds.  Exam reveals no gallop.   No murmur heard. Pulmonary/Chest: Effort normal and  breath sounds normal. No respiratory distress. She has no wheezes. She has no rales.  Abdominal: Soft. Bowel sounds are normal. She exhibits no distension. There is no tenderness. There is no rebound.  Musculoskeletal: Normal range of motion. She exhibits no edema or tenderness.  Neurological: She is alert and oriented to person, place, and time. Gait normal.  Skin: No rash noted. No erythema.  Psychiatric: Affect and judgment normal.   LABORATORY PANEL:  Female CBC  Recent Labs Lab 08/04/16 0155 08/04/16 0755  WBC 6.0  --   HGB 9.0* 8.8*  HCT 27.0*  --   PLT 198  --    ------------------------------------------------------------------------------------------------------------------ Chemistries   Recent Labs Lab 08/03/16 1340 08/04/16 0155  NA 127* 127*  K 4.5 4.0  CL 93* 92*  CO2 27 27  GLUCOSE 113* 125*  BUN 13 13  CREATININE 0.89 0.83  CALCIUM 9.3 9.1  AST 20  --   ALT 13*  --   ALKPHOS 70  --   BILITOT 0.5  --    RADIOLOGY:  No results found. ASSESSMENT AND PLAN:   61 year old female with past medical history of GERD, Barrett's esophagus, anxiety/depression, PTSD, seizures, hypertension, irritable bowel syndrome presents to the hospital due to rectal bleeding.  1. Rectal bleeding-unclear source of the rectal bleeding presently. Questionable hemorrhoidal versus diverticulosis. -Hemoglobin stable,No active bleeding. Continue clear liquid diet, colonoscopy tomorrow per Dr. Vicente Males, gastroenterology consult.   Microcytic anemia :  Check iron studies, b12,folate,ferritin, TSH,celiac serology and urine . If iron deficiency would need IV iron.  If iron deficient then will need repeat EGD as an outpatient , if negative will then need a capsule study  to evaluate the small bowel per Dr. Vicente Males.  2. Anxiety/depression-continue Xanax, Remeron, Lexapro.  3. History of seizures-continue carbamazepine.  4. GERD-continue Protonix.  5. Essential hypertension-hemodynamically  stable. Continue atenolol.  6. History of renal bowel syndrome-continue hyoscyamine  Hyponatremia. Continue normal saline IV and follow-up BMP.  All the records are reviewed and case discussed with Care Management/Social Worker. Management plans discussed with the patient, family and they are in agreement.  CODE STATUS: Full Code  TOTAL TIME TAKING CARE OF THIS PATIENT: 32 minutes.   More than 50% of the time was spent in counseling/coordination of care: YES  POSSIBLE D/C IN 1 DAYS, DEPENDING ON CLINICAL CONDITION.   Demetrios Loll M.D on 08/04/2016 at 2:40 PM  Between 7am to 6pm - Pager - [phone]  After 6pm go to [email or web address] - Proofreader  [facility]  Office  [phone]  CC: Primary care physician; [name], MD  Note: This dictation was prepared with Dragon dictation along with smaller phrase technology. Any transcriptional errors that result from this process are unintentional.

## 2016-08-04 NOTE — Consult Note (Signed)
[name] MD  [address] [place], [place] [address] Phone: [phone] Fax : [phone]  Consultation  Referring Provider:  Dr Verdell Carmine Primary Care Physician:  [name], MD Primary Gastroenterologist:  None          Reason for Consultation:     Rectal bleeding   Date of Admission:  08/03/2016 Date of Consultation:  08/04/2016         HPI:   Christina French is a 61 y.o. female   Last colonoscopy in 2016 with Dr Allen Norris performed for evaluation of iron deficiency anemia showed internal hemorrhoids and small polyps which were excised. She also had an EGD at that time which showed Barretts esophagus with no dysplasia -long segment.   On admission she had a HB of 9 grams, MCV 80 . 8 months back Hb 10.3 . No iron studies. On chart.   This admission she presents with rectal bleeding , shortness of breath . She says she has had some blood mixed in the stool for more than a week but day before night noticed after a painless bowel movement that there was lot of blood in the toilet bowl. Last bowel movement this morning had no blood. Denies any NSAID use, hematemesis, vaginal bleeding or blood in her urine. Denies any abdominal pain. Says in 2016 she was advised to get a capsule study but never went ahead with it. She says she has had multiple colonoscopy procedures in the past and has had many polyps taken out .  Past Medical History:  Diagnosis Date  . Anemia   . Anxiety   . Arthritis   . Barrett esophagus   . Cancer ([place]) 2016    Skin cancer right leg mylenoma  . Depression   . GERD (gastroesophageal reflux disease)   . History of blood transfusion    with a surgery  . History of bronchitis   . History of pneumonia   . Hypertension   . IBS (irritable bowel syndrome)   . Pneumonia 09/27/15  . PTSD (post-traumatic stress disorder)   . Renal insufficiency    reports acute kidney injury  . Seizures ([place])    rare - last one 07/30/14 - was very anemic.  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  Vertigo    thinks related to sinus issues    Past Surgical History:  Procedure Laterality Date  . ABDOMINAL HYSTERECTOMY    . CESAREAN SECTION    . COLONOSCOPY N/A 08/31/2014   Procedure: COLONOSCOPY;  Surgeon: Lucilla Lame, MD;  Location: [facility];  Service: Gastroenterology;  Laterality: N/A;  . CYST EXCISION  2011   from throat  . ESOPHAGOGASTRODUODENOSCOPY N/A 08/31/2014   Procedure: ESOPHAGOGASTRODUODENOSCOPY (EGD);  Surgeon: Lucilla Lame, MD;  Location: [facility];  Service: Gastroenterology;  Laterality: N/A;  . HEMORRHOID SURGERY    . INCISION AND DRAINAGE HIP Right 11/25/2014   Procedure: IRRIGATION  AND DRAINAGEOF RIGHT HIP WITH PLACEMENT OF ANTIBIOUTIC BEADS;  Surgeon: Mcarthur Rossetti, MD;  Location: [facility];  Service: Orthopedics;  Laterality: Right;  . INCISION AND DRAINAGE HIP Right 11/28/2014   Procedure: Repeat I&D Right Hip;  Surgeon: Mcarthur Rossetti, MD;  Location: [facility];  Service: Orthopedics;  Laterality: Right;  . JOINT REPLACEMENT Right 2007   hip  . TOTAL HIP REVISION Right 03/19/2015   Procedure: RIGHT TOTAL HIP ARTHROPLASTY REVISION;  Surgeon: Meredith Pel, MD;  Location: [facility];  Service: Orthopedics;  Laterality: Right;  .  TOTAL HIP REVISION Right 11/18/2015   Procedure: TOTAL HIP REVISION;  Surgeon: Frederik Pear, MD;  Location: [place] [facility];  Service: Orthopedics;  Laterality: Right;    Prior to Admission medications   Medication Sig Start Date End Date Taking? Authorizing Provider  acetaminophen (TYLENOL) 500 MG tablet Take 1,000 mg by mouth 2 (two) times daily as needed for mild pain or headache.   Yes Historical Provider, MD  ALPRAZolam Duanne Moron) 1 MG tablet Take 1 mg by mouth 3 (three) times daily as needed for anxiety.    Yes Historical Provider, MD  atenolol (TENORMIN) 50 MG tablet Take 50 mg by mouth 2 (two) times daily.   Yes Historical Provider, MD  carbamazepine (TEGRETOL) 200 MG tablet Take 300 mg by mouth 2 (two) times daily.     Yes Historical Provider, MD  cyclobenzaprine (FLEXERIL) 10 MG tablet Take 10 mg by mouth 2 (two) times daily as needed for muscle spasms.   Yes Historical Provider, MD  escitalopram (LEXAPRO) 20 MG tablet Take 20 mg by mouth daily.    Yes Historical Provider, MD  hyoscyamine (LEVSIN, ANASPAZ) 0.125 MG tablet Take 1 tablet (0.125 mg total) by mouth 4 (four) times daily as needed for cramping. 02/06/15  Yes Lucilla Lame, MD  levETIRAcetam (KEPPRA) 500 MG tablet Take 500 mg by mouth 2 (two) times daily. 05/08/16 05/08/17 Yes Historical Provider, MD  mirtazapine (REMERON) 30 MG tablet Take 30 mg by mouth at bedtime.    Yes Historical Provider, MD  ondansetron (ZOFRAN) 8 MG tablet Take 8 mg by mouth 2 (two) times daily as needed for nausea or vomiting.    Yes Historical Provider, MD  pantoprazole (PROTONIX) 40 MG tablet Take 40 mg by mouth daily.   Yes Historical Provider, MD  traZODone (DESYREL) 50 MG tablet Take 50 mg by mouth at bedtime.   Yes Historical Provider, MD  aspirin EC 325 MG tablet Take 1 tablet (325 mg total) by mouth 2 (two) times daily. Patient not taking: Reported on 08/03/2016 11/20/15   Leighton Parody, PA-C  oxyCODONE-acetaminophen (ROXICET) 5-325 MG tablet Take 1 tablet by mouth every 4 (four) hours as needed. Patient not taking: Reported on 08/03/2016 11/20/15   Leighton Parody, PA-C    Family History  Problem Relation Age of Onset  . Alcoholism Father   . Throat cancer Mother      Social History  Substance Use Topics  . Smoking status: Never Smoker  . Smokeless tobacco: Never Used  . Alcohol use No    Allergies as of 08/03/2016 - Review Complete 08/03/2016  Allergen Reaction Noted  . No known allergies  11/17/2015    Review of Systems:    All systems reviewed and negative except where noted in HPI.   Physical Exam:  Vital signs in last 24 hours: Temp:  [97.8 F (36.6 C)-98.1 F (36.7 C)] 98.1 F (36.7 C) (04/10 0815) Pulse Rate:  [72-96] 78 (04/10 0815) Resp:   [12-20] 19 (04/09 1952) BP: (131-160)/(77-99) 131/77 (04/10 0815) SpO2:  [97 %-100 %] 98 % (04/10 0815) Weight:  [190 lb (86.2 kg)-198 lb 9.6 oz (90.1 kg)] 198 lb 9.6 oz (90.1 kg) (04/09 2006) Last BM Date: 08/03/16 General:   Pleasant, cooperative in NAD Head:  Normocephalic and atraumatic. Eyes:   No icterus.   Conjunctiva pink. PERRLA. Ears:  Normal auditory acuity. Neck:  Supple; no masses or thyroidomegaly Lungs: Respirations even and unlabored. Lungs clear to auscultation bilaterally.   No wheezes, crackles, or  rhonchi.  Heart:  Regular rate and rhythm;  Without murmur, clicks, rubs or gallops Abdomen:  Soft, nondistended, nontender. Normal bowel sounds. No appreciable masses or hepatomegaly.  No rebound or guarding.  Rectal:  Not performed. Extremities:  Without edema, cyanosis or clubbing. Neurologic:  Alert and oriented x3;  grossly normal neurologically. Psych:  Alert and cooperative. Normal affect.  LAB RESULTS:  Recent Labs  08/03/16 1340 08/03/16 2039 08/04/16 0155 08/04/16 0755  WBC 5.5  --  6.0  --   HGB 8.5* 8.5* 9.0* 8.8*  HCT 25.7*  --  27.0*  --   PLT 169  --  198  --    BMET  Recent Labs  08/03/16 1340 08/04/16 0155  NA 127* 127*  K 4.5 4.0  CL 93* 92*  CO2 27 27  GLUCOSE 113* 125*  BUN 13 13  CREATININE 0.89 0.83  CALCIUM 9.3 9.1   LFT  Recent Labs  08/03/16 1340  PROT 7.1  ALBUMIN 4.3  AST 20  ALT 13*  ALKPHOS 70  BILITOT 0.5   PT/INR  Recent Labs  08/03/16 1752  LABPROT 14.1  INR 1.09    STUDIES: No results found.    Impression / Plan:   Christina French is a 61 y.o. y/o female with  Prior history of microcytic anemia , Barrettes esophagus comes in with rectal bleeding  . Very likely a diverticular bleed . Labs also suggest she is possibly dehydrated with a low sodium and chloride . Her Hb after hydration could possibly be lower .   Plan  1. Microcytic anemia :  Check iron studies, b12,folate,ferritin, TSH,celiac serology  and urine . If iron deficiency would need IV iron  2. Transfuse based on HB  3. Colonoscopy tomorrow to evaluate rectal bleeding  4. If iron deficient then will need repeat EGD as an outpatient , if negative will then need a capsule study to evaluate the small bowel   Thank you for involving me in the care of this patient.      LOS: 0 days   [name], MD  08/04/2016, 11:28 AM

## 2016-08-05 ENCOUNTER — Encounter
Admission: EM | Disposition: A | Discharge status: Home / Self Care | Payer: Self-pay | Source: Home / Self Care | Attending: Emergency Medicine

## 2016-08-05 ENCOUNTER — Observation Stay: Payer: Medicare Other | Admitting: Certified Registered"

## 2016-08-05 ENCOUNTER — Encounter: Payer: Self-pay | Admitting: Anesthesiology

## 2016-08-05 DIAGNOSIS — I10 Essential (primary) hypertension: Secondary | ICD-10-CM | POA: Diagnosis not present

## 2016-08-05 DIAGNOSIS — K625 Hemorrhage of anus and rectum: Secondary | ICD-10-CM | POA: Diagnosis not present

## 2016-08-05 DIAGNOSIS — K219 Gastro-esophageal reflux disease without esophagitis: Secondary | ICD-10-CM | POA: Diagnosis not present

## 2016-08-05 DIAGNOSIS — R569 Unspecified convulsions: Secondary | ICD-10-CM | POA: Diagnosis not present

## 2016-08-05 DIAGNOSIS — K648 Other hemorrhoids: Secondary | ICD-10-CM | POA: Diagnosis not present

## 2016-08-05 DIAGNOSIS — K649 Unspecified hemorrhoids: Secondary | ICD-10-CM | POA: Diagnosis not present

## 2016-08-05 DIAGNOSIS — D649 Anemia, unspecified: Secondary | ICD-10-CM | POA: Diagnosis not present

## 2016-08-05 HISTORY — PX: COLONOSCOPY: SHX5424

## 2016-08-05 LAB — BASIC METABOLIC PANEL
Anion gap: 7 (ref 5–15)
BUN: 11 mg/dL (ref 6–20)
CHLORIDE: 93 mmol/L — AB (ref 101–111)
CO2: 26 mmol/L (ref 22–32)
CREATININE: 0.89 mg/dL (ref 0.44–1.00)
Calcium: 8.7 mg/dL — ABNORMAL LOW (ref 8.9–10.3)
GFR calc Af Amer: >60 mL/min (ref >60)
GFR calc non Af Amer: >60 mL/min (ref >60)
GLUCOSE: 120 mg/dL — AB (ref 65–99)
Potassium: 3.3 mmol/L — ABNORMAL LOW (ref 3.5–5.1)
Sodium: 126 mmol/L — ABNORMAL LOW (ref 135–145)

## 2016-08-05 LAB — HEMOGLOBIN: Hemoglobin: 8.2 g/dL — ABNORMAL LOW (ref 12.0–16.0)

## 2016-08-05 SURGERY — COLONOSCOPY
Anesthesia: General

## 2016-08-05 MED ORDER — SODIUM CHLORIDE 0.9 % IV SOLN: INTRAVENOUS | Status: DC

## 2016-08-05 MED ORDER — MIDAZOLAM HCL 2 MG/2ML IJ SOLN
INTRAMUSCULAR | Status: DC | PRN
  Administered 2016-08-05: 2 mg via INTRAVENOUS

## 2016-08-05 MED ORDER — HYDROMORPHONE HCL 1 MG/ML IJ SOLN
0.5000 mg | INTRAMUSCULAR | Status: DC | PRN
Start: 2016-08-05 — End: 2016-08-05
  Administered 2016-08-05 (×2): 0.5 mg via INTRAVENOUS
  Filled 2016-08-05 (×2): qty 1

## 2016-08-05 MED ORDER — PROPOFOL 10 MG/ML IV BOLUS
INTRAVENOUS | Status: DC | PRN
Start: 2016-08-05 — End: 2016-08-05
  Administered 2016-08-05: 50 mg via INTRAVENOUS

## 2016-08-05 MED ORDER — HYDROMORPHONE HCL 1 MG/ML IJ SOLN
0.2500 mg | Freq: Once | INTRAMUSCULAR | Status: AC
  Administered 2016-08-05: 0.25 mg via INTRAVENOUS
  Filled 2016-08-05: qty 1

## 2016-08-05 MED ORDER — FERUMOXYTOL INJECTION 510 MG/17 ML
510.0000 mg | Freq: Once | INTRAVENOUS | Status: AC
  Administered 2016-08-05: 510 mg via INTRAVENOUS
  Filled 2016-08-05: qty 17

## 2016-08-05 MED ORDER — PROPOFOL 500 MG/50ML IV EMUL
INTRAVENOUS | Status: AC
  Filled 2016-08-05: qty 50

## 2016-08-05 MED ORDER — FERROUS SULFATE 325 (65 FE) MG PO TABS: 325.0000 mg | ORAL_TABLET | Freq: Two times a day (BID) | ORAL | Status: DC

## 2016-08-05 MED ORDER — PHENYLEPHRINE HCL 10 MG/ML IJ SOLN
INTRAMUSCULAR | Status: DC | PRN
  Administered 2016-08-05: 100 ug via INTRAVENOUS

## 2016-08-05 MED ORDER — MIDAZOLAM HCL 2 MG/2ML IJ SOLN
INTRAMUSCULAR | Status: AC
  Filled 2016-08-05: qty 2

## 2016-08-05 MED ORDER — FERROUS SULFATE 325 (65 FE) MG PO TABS: 325.0000 mg | ORAL_TABLET | Freq: Two times a day (BID) | ORAL | 0 refills | Status: DC

## 2016-08-05 MED ORDER — PROPOFOL 500 MG/50ML IV EMUL
INTRAVENOUS | Status: DC | PRN
  Administered 2016-08-05: 150 ug/kg/min via INTRAVENOUS

## 2016-08-05 NOTE — Progress Notes (Signed)
Shift assessment completed this am. Pt in no distress, on room air,lungs clear, HR regular, abdomen soft, bs heard. Pt confirmed that she had finished bowel prep, and had begun bleeding again overnight. Pt denied pain, stated she had some cramping to her abdomen. PIV #20 intact to lac, site free of redness and swelling, iv ns infusing at 20 mls/hr.ppp. Since assessment, pt received oral meds early and this Probation officer gave report to enodoscopy. Pt left the unit to go to endoscopy at approx 1210, this writer received report at this time for pt's return.

## 2016-08-05 NOTE — Op Note (Signed)
[facility] Gastroenterology Patient Name: Christina French Procedure Date: 08/05/2016 12:37 PM MRN: [id number] Account #: [id number] Date of Birth: 11-03-1955 Admit Type: Outpatient Age: 61 Room: [facility] ENDO ROOM 1 Gender: Female Note Status: Finalized Procedure:            Colonoscopy Indications:          Rectal bleeding Providers:            [name] MD, MD Referring MD:         [name], MD (Referring MD) Medicines:            Monitored Anesthesia Care Complications:        No immediate complications. Procedure:            Pre-Anesthesia Assessment:                       - Prior to the procedure, a History and Physical was                        performed, and patient medications, allergies and                        sensitivities were reviewed. The patient's tolerance of                        previous anesthesia was reviewed.                       - The risks and benefits of the procedure and the                        sedation options and risks were discussed with the                        patient. All questions were answered and informed                        consent was obtained.                       - ASA Grade Assessment: III - A patient with severe                        systemic disease.                       After obtaining informed consent, the colonoscope was                        passed under direct vision. Throughout the procedure,                        the patient's blood pressure, pulse, and oxygen                        saturations were monitored continuously. The                        Colonoscope was introduced through the anus and                        advanced  to the the terminal ileum. The colonoscopy was                        performed with ease. The patient tolerated the                        procedure well. The quality of the bowel preparation                        was excellent. Findings:      The perianal and digital  rectal examinations were normal.      Non-bleeding internal hemorrhoids were found during retroflexion. The       hemorrhoids were large and Grade I (internal hemorrhoids that do not       prolapse).      The terminal ileum appeared normal.      The exam was otherwise without abnormality on direct and retroflexion       views. Impression:           - Non-bleeding internal hemorrhoids.                       - The examined portion of the ileum was normal.                       - The examination was otherwise normal on direct and                        retroflexion views.                       - No specimens collected. Recommendation:       - Return patient to referring hospital for possible                        discharge same day.                       - Advance diet as tolerated.                       - Continue present medications.                       - Repeat colonoscopy in 5 years for surveillance.                       - Return to GI clinic in 1 week.                       - To visualize the small bowel, perform video capsule                        endoscopy in 2 weeks. Procedure Code(s):    --- Professional ---                       [phone], Colonoscopy, flexible; diagnostic, including                        collection of specimen(s) by brushing or washing, when  performed (separate procedure) Diagnosis Code(s):    --- Professional ---                       K64.0, First degree hemorrhoids                       K62.5, Hemorrhage of anus and rectum CPT copyright 2016 American Medical Association. All rights reserved. The codes documented in this report are preliminary and upon coder review may  be revised to meet current compliance requirements. [name], MD [name] MD, MD 08/05/2016 12:58:35 PM This report has been signed electronically. Number of Addenda: 0 Note Initiated On: 08/05/2016 12:37 PM Scope Withdrawal Time: 0 hours 8 minutes 11 seconds   Total Procedure Duration: 0 hours 11 minutes 3 seconds       [facility]

## 2016-08-05 NOTE — Anesthesia Postprocedure Evaluation (Signed)
Anesthesia Post Note  Patient: Christina French  Procedure(s) Performed: Procedure(s) (LRB): COLONOSCOPY (N/A)  Patient location during evaluation: Endoscopy Anesthesia Type: General Level of consciousness: awake and alert Pain management: pain level controlled Vital Signs Assessment: post-procedure vital signs reviewed and stable Respiratory status: spontaneous breathing, nonlabored ventilation, respiratory function stable and patient connected to nasal cannula oxygen Cardiovascular status: blood pressure returned to baseline and stable Postop Assessment: no signs of nausea or vomiting Anesthetic complications: no     Last Vitals:  Vitals:   08/05/16 1350 08/05/16 1400  BP: 115/63 111/74  Pulse: 71 73  Resp: 14 13  Temp:      Last Pain:  Vitals:   08/05/16 1350  TempSrc:   PainSc: 0-No pain                 Martha Clan

## 2016-08-05 NOTE — Anesthesia Preprocedure Evaluation (Signed)
Anesthesia Evaluation  Patient identified by MRN, date of birth, ID band Patient awake    Reviewed: Allergy & Precautions, H&P , NPO status , Patient's Chart, lab work & pertinent test results, reviewed documented beta blocker date and time   History of Anesthesia Complications Negative for: history of anesthetic complications  Airway Mallampati: IV  TM Distance: >3 FB Neck ROM: full    Dental  (+) Poor Dentition, Dental Advidsory Given   Pulmonary neg shortness of breath, sleep apnea , neg COPD, neg recent URI,           Cardiovascular Exercise Tolerance: Good hypertension, (-) angina(-) CAD, (-) Past MI, (-) Cardiac Stents and (-) CABG (-) dysrhythmias (-) Valvular Problems/Murmurs     Neuro/Psych Seizures -, Well Controlled,  PSYCHIATRIC DISORDERS (PTSD, depression, and anxiety)    GI/Hepatic Neg liver ROS, GERD  ,  Endo/Other  negative endocrine ROS  Renal/GU CRFRenal disease  negative genitourinary   Musculoskeletal   Abdominal   Peds  Hematology  (+) Blood dyscrasia, anemia ,   Anesthesia Other Findings Past Medical History: No date: Anemia No date: Anxiety No date: Arthritis No date: Barrett esophagus 2016 : Cancer ([place])     Comment: Skin cancer right leg mylenoma No date: Depression No date: GERD (gastroesophageal reflux disease) No date: History of blood transfusion     Comment: with a surgery No date: History of bronchitis No date: History of pneumonia No date: Hypertension No date: IBS (irritable bowel syndrome) 09/27/15: Pneumonia No date: PTSD (post-traumatic stress disorder) No date: Renal insufficiency     Comment: reports acute kidney injury No date: Seizures ([place])     Comment: rare - last one 07/30/14 - was very anemic.                Silent seziure -June 2016-  No date: Vertigo     Comment: thinks related to sinus issues   Reproductive/Obstetrics negative OB ROS                              Anesthesia Physical Anesthesia Plan  ASA: III  Anesthesia Plan: General   Post-op Pain Management:    Induction:   Airway Management Planned:   Additional Equipment:   Intra-op Plan:   Post-operative Plan:   Informed Consent: I have reviewed the patients History and Physical, chart, labs and discussed the procedure including the risks, benefits and alternatives for the proposed anesthesia with the patient or authorized representative who has indicated his/her understanding and acceptance.   Dental Advisory Given  Plan Discussed with: Anesthesiologist, CRNA and Surgeon  Anesthesia Plan Comments:         Anesthesia Quick Evaluation

## 2016-08-05 NOTE — Progress Notes (Signed)
Colonoscopy no blood in colon or terminal ileum. No other lesions seen .   Plan   Advance diet  IV iron as she is iron deficienct  GI Op follow up and capsule study   PCP to check cbc in 3-7 days time to ensure stable  I will sign off.  Please call me if any further GI concerns or questions.  We would like to thank you for the opportunity to participate in the care of Christina French.   Dr [name]  Gastroenterology/Hepatology Pager: [phone]

## 2016-08-05 NOTE — Anesthesia Procedure Notes (Signed)
Performed by: Tyreik Delahoussaye Pre-anesthesia Checklist: Patient identified, Emergency Drugs available, Suction available, Patient being monitored and Timeout performed Patient Re-evaluated:Patient Re-evaluated prior to inductionOxygen Delivery Method: Nasal cannula Preoxygenation: Pre-oxygenation with 100% oxygen Intubation Type: IV induction       

## 2016-08-05 NOTE — Transfer of Care (Signed)
Immediate Anesthesia Transfer of Care Note  Patient: Christina French  Procedure(s) Performed: Procedure(s): COLONOSCOPY (N/A)  Patient Location: PACU  Anesthesia Type:General  Level of Consciousness: awake and responds to stimulation  Airway & Oxygen Therapy: Patient Spontanous Breathing and Patient connected to nasal cannula oxygen  Post-op Assessment: Report given to RN and Post -op Vital signs reviewed and stable  Post vital signs: Reviewed and stable  Last Vitals:  Vitals:   08/05/16 1300 08/05/16 1304  BP: (!) 92/55 (!) 92/55  Pulse: 73 76  Resp: 14 18  Temp: (!) 36 C     Last Pain:  Vitals:   08/05/16 1300  TempSrc:   PainSc: 0-No pain         Complications: No apparent anesthesia complications

## 2016-08-05 NOTE — Anesthesia Post-op Follow-up Note (Cosign Needed)
Anesthesia QCDR form completed.        

## 2016-08-05 NOTE — Progress Notes (Signed)
Pt. Bleeding from the rectum. Dr. Jannifer Franklin notified and ordering stat hemoglobin.

## 2016-08-05 NOTE — Progress Notes (Signed)
Pts. Hemoglobin came back as 8.2. Dr. Marcille Blanco has no new orders at this time.

## 2016-08-05 NOTE — Discharge Instructions (Signed)
DO not take aspirin till you see Dr. Vicente Males.  Return to ER if any worsening rectal bleeding

## 2016-08-05 NOTE — Progress Notes (Signed)
This Probation officer removed pt's piv from lac with catheter intact at 1630. D/C instructions were reviewed with pt, who verbalized understanding and signed, received copy. Pt is awaiting ride home. Pt stated that she is still bleeding from her rectum. This Probation officer explained that colonoscopy report stated no bleeding seen, pt is agreeable to capsule study as outpatient, stated she had refused this earlier but will follow through with it this time. Pt stated that this bleeding has been intermittent for a lengthy time. Pt stated she had her hemorrhoids operated on once, and the day after surgery they "broke open and bled again"the patient aware that she should follow up with MD sooner than scheduled appt if needed.

## 2016-08-05 NOTE — Progress Notes (Signed)
Pt has been dc'd to the front entrance of facility via wc at this time.

## 2016-08-05 NOTE — H&P (Signed)
[name] MD [address], [place], [place] [address] Phone: [phone] Fax : [phone]  Primary Care Physician:  [name], MD Primary Gastroenterologist:  Dr. [name]   Pre-Procedure History & Physical: HPI:  Christina French is a 61 y.o. female is here for an colonoscopy.   Past Medical History:  Diagnosis Date  . Anemia   . Anxiety   . Arthritis   . Barrett esophagus   . Cancer ([place]) 2016    Skin cancer right leg mylenoma  . Depression   . GERD (gastroesophageal reflux disease)   . History of blood transfusion    with a surgery  . History of bronchitis   . History of pneumonia   . Hypertension   . IBS (irritable bowel syndrome)   . Pneumonia 09/27/15  . PTSD (post-traumatic stress disorder)   . Renal insufficiency    reports acute kidney injury  . Seizures ([place])    rare - last one 07/30/14 - was very anemic.  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 Vertigo    thinks related to sinus issues    Past Surgical History:  Procedure Laterality Date  . ABDOMINAL HYSTERECTOMY    . CESAREAN SECTION    . COLONOSCOPY N/A 08/31/2014   Procedure: COLONOSCOPY;  Surgeon: Lucilla Lame, MD;  Location: [facility];  Service: Gastroenterology;  Laterality: N/A;  . CYST EXCISION  2011   from throat  . ESOPHAGOGASTRODUODENOSCOPY N/A 08/31/2014   Procedure: ESOPHAGOGASTRODUODENOSCOPY (EGD);  Surgeon: Lucilla Lame, MD;  Location: [facility];  Service: Gastroenterology;  Laterality: N/A;  . HEMORRHOID SURGERY    . INCISION AND DRAINAGE HIP Right 11/25/2014   Procedure: IRRIGATION  AND DRAINAGEOF RIGHT HIP WITH PLACEMENT OF ANTIBIOUTIC BEADS;  Surgeon: Mcarthur Rossetti, MD;  Location: [facility];  Service: Orthopedics;  Laterality: Right;  . INCISION AND DRAINAGE HIP Right 11/28/2014   Procedure: Repeat I&D Right Hip;  Surgeon: Mcarthur Rossetti, MD;  Location: [facility];  Service: Orthopedics;  Laterality: Right;  . JOINT REPLACEMENT Right 2007   hip  . TOTAL HIP  REVISION Right 03/19/2015   Procedure: RIGHT TOTAL HIP ARTHROPLASTY REVISION;  Surgeon: Meredith Pel, MD;  Location: [facility];  Service: Orthopedics;  Laterality: Right;  . TOTAL HIP REVISION Right 11/18/2015   Procedure: TOTAL HIP REVISION;  Surgeon: Frederik Pear, MD;  Location: [facility];  Service: Orthopedics;  Laterality: Right;    Prior to Admission medications   Medication Sig Start Date End Date Taking? Authorizing Provider  acetaminophen (TYLENOL) 500 MG tablet Take 1,000 mg by mouth 2 (two) times daily as needed for mild pain or headache.   Yes Historical Provider, MD  ALPRAZolam Duanne Moron) 1 MG tablet Take 1 mg by mouth 3 (three) times daily as needed for anxiety.    Yes Historical Provider, MD  atenolol (TENORMIN) 50 MG tablet Take 50 mg by mouth 2 (two) times daily.   Yes Historical Provider, MD  carbamazepine (TEGRETOL) 200 MG tablet Take 300 mg by mouth 2 (two) times daily.    Yes Historical Provider, MD  cyclobenzaprine (FLEXERIL) 10 MG tablet Take 10 mg by mouth 2 (two) times daily as needed for muscle spasms.   Yes Historical Provider, MD  escitalopram (LEXAPRO) 20 MG tablet Take 20 mg by mouth daily.    Yes Historical Provider, MD  hyoscyamine (LEVSIN, ANASPAZ) 0.125 MG tablet Take 1 tablet (0.125 mg total) by mouth 4 (four) times daily as needed for  cramping. 02/06/15  Yes Lucilla Lame, MD  levETIRAcetam (KEPPRA) 500 MG tablet Take 500 mg by mouth 2 (two) times daily. 05/08/16 05/08/17 Yes Historical Provider, MD  mirtazapine (REMERON) 30 MG tablet Take 30 mg by mouth at bedtime.    Yes Historical Provider, MD  ondansetron (ZOFRAN) 8 MG tablet Take 8 mg by mouth 2 (two) times daily as needed for nausea or vomiting.    Yes Historical Provider, MD  pantoprazole (PROTONIX) 40 MG tablet Take 40 mg by mouth daily.   Yes Historical Provider, MD  traZODone (DESYREL) 50 MG tablet Take 50 mg by mouth at bedtime.   Yes Historical Provider, MD  aspirin EC 325 MG tablet Take 1 tablet (325 mg total)  by mouth 2 (two) times daily. Patient not taking: Reported on 08/03/2016 11/20/15   Leighton Parody, PA-C  oxyCODONE-acetaminophen (ROXICET) 5-325 MG tablet Take 1 tablet by mouth every 4 (four) hours as needed. Patient not taking: Reported on 08/03/2016 11/20/15   Leighton Parody, PA-C    Allergies as of 08/03/2016 - Review Complete 08/03/2016  Allergen Reaction Noted  . No known allergies  11/17/2015    Family History  Problem Relation Age of Onset  . Alcoholism Father   . Throat cancer Mother     Social History   Social History  . Marital status: Single    Spouse name: N/A  . Number of children: N/A  . Years of education: N/A   Occupational History  . Not on file.   Social History Main Topics  . Smoking status: Never Smoker  . Smokeless tobacco: Never Used  . Alcohol use No  . Drug use: No  . Sexual activity: Not on file   Other Topics Concern  . Not on file   Social History Narrative  . No narrative on file    Review of Systems: See HPI, otherwise negative ROS  Physical Exam: BP 135/76   Pulse 76   Temp 98.4 F (36.9 C) (Tympanic)   Resp 20   Ht 5\' 2"  (1.575 m)   Wt 198 lb 9.6 oz (90.1 kg)   SpO2 98%   BMI 36.32 kg/m  General:   Alert,  pleasant and cooperative in NAD Head:  Normocephalic and atraumatic. Neck:  Supple; no masses or thyromegaly. Lungs:  Clear throughout to auscultation.    Heart:  Regular rate and rhythm. Abdomen:  Soft, nontender and nondistended. Normal bowel sounds, without guarding, and without rebound.   Neurologic:  Alert and  oriented x4;  grossly normal neurologically.  Impression/Plan: [name] is here for an colonoscopy to be performed for rectal bleeding   Risks, benefits, limitations, and alternatives regarding  colonoscopy have been reviewed with the patient.  Questions have been answered.  All parties agreeable.   [name], MD  08/05/2016, 12:31 PM

## 2016-08-06 ENCOUNTER — Encounter: Payer: Self-pay | Admitting: Gastroenterology

## 2016-08-06 LAB — TYPE AND SCREEN
ABO/RH(D): O NEG
Antibody Screen: POSITIVE
UNIT DIVISION: 0
Unit division: 0

## 2016-08-06 LAB — CELIAC DISEASE PANEL
Endomysial Ab, IgA: NEGATIVE
IgA: 190 mg/dL (ref 87–352)
Tissue Transglutaminase Ab, IgA: <2 U/mL (ref 0–3)

## 2016-08-06 LAB — BPAM RBC
Blood Product Expiration Date: 201805022359
Blood Product Expiration Date: 201805052359
ISSUE DATE / TIME: 201804101458
UNIT TYPE AND RH: 9500
Unit Type and Rh: 9500

## 2016-08-06 NOTE — Discharge Summary (Signed)
[facility] NAME: Christina French    MR#:  [id number]  DATE OF BIRTH:  Mar 01, 1956  DATE OF ADMISSION:  08/03/2016 ADMITTING PHYSICIAN: Henreitta Leber, MD  DATE OF DISCHARGE: 08/05/2016  6:30 PM  PRIMARY CARE PHYSICIAN: [name], MD   ADMISSION DIAGNOSIS:  Rectal bleeding [K62.5]  DISCHARGE DIAGNOSIS:  Active Problems:   Rectal bleeding   SECONDARY DIAGNOSIS:   Past Medical History:  Diagnosis Date  . Anemia   . Anxiety   . Arthritis   . Barrett esophagus   . Cancer ([facility]) 2016    Skin cancer right leg mylenoma  . Depression   . GERD (gastroesophageal reflux disease)   . History of blood transfusion    with a surgery  . History of bronchitis   . History of pneumonia   . Hypertension   . IBS (irritable bowel syndrome)   . Pneumonia 09/27/15  . PTSD (post-traumatic stress disorder)   . Renal insufficiency    reports acute kidney injury  . Seizures ([facility])    rare - last one 07/30/14 - was very anemic.  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 Vertigo    thinks related to sinus issues     ADMITTING HISTORY  HISTORY OF PRESENT ILLNESS:  Christina French  is a 61 y.o. female with a known history of Seizures, vertigo, PTSD, irritable bowel syndrome, hypertension, GERD, history of Barrett's esophagus, depression who presents to the hospital due to rectal bleeding. Patient says she's been having spotting in her stool now for the past few weeks but overnight she developed multiple episodes of rectal bleeding including some clots that she passed this morning. She was a bit concerned therefore came to the ER for further evaluation. She also complains of some exertional shortness of breath but denies any chest pain, abdominal pain, nausea vomiting or any other associated symptoms presently. Patient's hemoglobin presently is 8.5. His hemoglobin last year was 10. Given ongoing rectal bleeding hospitalist services were contacted further treatment and  evaluation.   HOSPITAL COURSE:   * Rectal bleeding due to internal hemorrhoids Patient was admitted to medical floor. Serial monitoring of hemoglobin showed a stable hemoglobin levels. She was placed on a liquid diet. Later patient had a colonoscopy. This showed nonbleeding internal hemorrhoids. No diverticulosis or polyps found. No blood in colon. Dr. Vicente Males of GI recommended patient follow-up with him in the office for further EGD or a capsule endoscopy.  Iron levels checked and patient had IV Feraheme dose prior to discharge. She has no further bleeding and with stable hemoglobin and she is being discharged home with iron supplements to follow-up with GI. Not on any blood thinners.  She has been advised to return to the hospital with the bleeding reoccurs.  CONSULTS OBTAINED:  Treatment Team:  Hillary Bow, MD  DRUG ALLERGIES:   Allergies  Allergen Reactions  . No Known Allergies     DISCHARGE MEDICATIONS:   Discharge Medication List as of 08/05/2016  4:36 PM    START taking these medications   Details  ferrous sulfate 325 (65 FE) MG tablet Take 1 tablet (325 mg total) by mouth 2 (two) times daily with a meal., Starting Wed 08/05/2016, Normal      CONTINUE these medications which have NOT CHANGED   Details  acetaminophen (TYLENOL) 500 MG tablet Take 1,000 mg by mouth 2 (two) times daily as needed for mild pain or headache., Until Discontinued, Historical  Med    ALPRAZolam (XANAX) 1 MG tablet Take 1 mg by mouth 3 (three) times daily as needed for anxiety. , Until Discontinued, Historical Med    atenolol (TENORMIN) 50 MG tablet Take 50 mg by mouth 2 (two) times daily., Until Discontinued, Historical Med    carbamazepine (TEGRETOL) 200 MG tablet Take 300 mg by mouth 2 (two) times daily. , Until Discontinued, Historical Med    cyclobenzaprine (FLEXERIL) 10 MG tablet Take 10 mg by mouth 2 (two) times daily as needed for muscle spasms., Historical Med    escitalopram (LEXAPRO)  20 MG tablet Take 20 mg by mouth daily. , Until Discontinued, Historical Med    hyoscyamine (LEVSIN, ANASPAZ) 0.125 MG tablet Take 1 tablet (0.125 mg total) by mouth 4 (four) times daily as needed for cramping., Starting 02/06/2015, Until Discontinued, Normal    levETIRAcetam (KEPPRA) 500 MG tablet Take 500 mg by mouth 2 (two) times daily., Starting Fri 05/08/2016, Until Sat 05/08/2017, Historical Med    mirtazapine (REMERON) 30 MG tablet Take 30 mg by mouth at bedtime. , Historical Med    ondansetron (ZOFRAN) 8 MG tablet Take 8 mg by mouth 2 (two) times daily as needed for nausea or vomiting. , Until Discontinued, Historical Med    pantoprazole (PROTONIX) 40 MG tablet Take 40 mg by mouth daily., Until Discontinued, Historical Med    traZODone (DESYREL) 50 MG tablet Take 50 mg by mouth at bedtime., Until Discontinued, Historical Med    aspirin EC 325 MG tablet Take 1 tablet (325 mg total) by mouth 2 (two) times daily., Starting Wed 11/20/2015, Print    oxyCODONE-acetaminophen (ROXICET) 5-325 MG tablet Take 1 tablet by mouth every 4 (four) hours as needed., Starting Wed 11/20/2015, Print        Today   VITAL SIGNS:  Blood pressure (!) 143/78, pulse 80, temperature 98.3 F (36.8 C), temperature source Oral, resp. rate 16, height 5\' 2"  (1.575 m), weight 90.1 kg (198 lb 9.6 oz), SpO2 100 %.  I/O:  No intake or output data in the 24 hours ending 08/06/16 1402  PHYSICAL EXAMINATION:  Physical Exam  GENERAL:  61 y.o.-year-old patient lying in the bed with no acute distress.  LUNGS: Normal breath sounds bilaterally, no wheezing, rales,rhonchi or crepitation. No use of accessory muscles of respiration.  CARDIOVASCULAR: S1, S2 normal. No murmurs, rubs, or gallops.  ABDOMEN: Soft, non-tender, non-distended. Bowel sounds present. No organomegaly or mass.  NEUROLOGIC: Moves all 4 extremities. PSYCHIATRIC: The patient is alert and oriented x 3.  SKIN: No obvious rash, lesion, or ulcer.   DATA  REVIEW:   CBC  Recent Labs Lab 08/04/16 0155  08/05/16 0201  WBC 6.0  --   --   HGB 9.0*  < > 8.2*  HCT 27.0*  --   --   PLT 198  --   --   < > = values in this interval not displayed.  Chemistries   Recent Labs Lab 08/03/16 1340  08/05/16 0201  NA 127*  < > 126*  K 4.5  < > 3.3*  CL 93*  < > 93*  CO2 27  < > 26  GLUCOSE 113*  < > 120*  BUN 13  < > 11  CREATININE 0.89  < > 0.89  CALCIUM 9.3  < > 8.7*  AST 20  --   --   ALT 13*  --   --   ALKPHOS 70  --   --   BILITOT  0.5  --   --   < > = values in this interval not displayed.  Cardiac Enzymes No results for input(s): TROPONINI in the last 168 hours.  Microbiology Results  Results for orders placed or performed during the hospital encounter of 11/26/15  Urine culture     Status: Abnormal   Collection Time: 12/02/15  1:30 PM  Result Value Ref Range Status   Specimen Description URINE, RANDOM  Final   Special Requests NONE  Final   Culture MULTIPLE SPECIES PRESENT, SUGGEST RECOLLECTION (A)  Final   Report Status 12/03/2015 FINAL  Final    RADIOLOGY:  No results found.  Follow up with PCP in 1 week.  Management plans discussed with the patient, family and they are in agreement.  CODE STATUS:  Code Status History    Date Active Date Inactive Code Status Order ID Comments User Context   08/03/2016  8:00 PM 08/05/2016  9:41 PM Full Code [id number]  Henreitta Leber, MD Inpatient   11/18/2015 10:19 PM 11/22/2015 12:31 PM Full Code [id number]  Leighton Parody, PA-C Inpatient   03/19/2015  9:38 PM 03/23/2015  7:18 PM Full Code [id number]  Meredith Pel, MD Inpatient    Advance Directive Documentation     Most Recent Value  Type of Advance Directive  Healthcare Power of Attorney  Pre-existing out of facility DNR order (yellow form or pink MOST form)  -  "MOST" Form in Place?  -      TOTAL TIME TAKING CARE OF THIS PATIENT ON DAY OF DISCHARGE: more than 30 minutes.   Hillary Bow R M.D on 08/06/2016 at 2:02  PM  Between 7am to 6pm - Pager - [phone]  After 6pm go to [email or web address] - password [email or web address] [facility]  Office  [phone]  CC: Primary care physician; [name], MD  Note: This dictation was prepared with Dragon dictation along with smaller phrase technology. Any transcriptional errors that result from this process are unintentional.

## 2016-08-07 ENCOUNTER — Telehealth: Payer: Self-pay

## 2016-08-07 NOTE — Telephone Encounter (Signed)
Advised pt of results per Dr. Vicente Males.   Gave location.

## 2016-08-07 NOTE — Telephone Encounter (Signed)
-----   Message from [name], MD sent at 08/06/2016  8:22 AM EDT ----- Celiac serology negative

## 2016-08-21 ENCOUNTER — Other Ambulatory Visit: Payer: Self-pay

## 2016-09-07 ENCOUNTER — Other Ambulatory Visit: Payer: Self-pay

## 2016-09-07 ENCOUNTER — Ambulatory Visit (INDEPENDENT_AMBULATORY_CARE_PROVIDER_SITE_OTHER): Payer: Medicare Other | Admitting: Gastroenterology

## 2016-09-07 ENCOUNTER — Encounter: Payer: Self-pay | Admitting: Gastroenterology

## 2016-09-07 VITALS — BP 124/82 | HR 76 | Temp 98.0°F | Ht 61.0 in | Wt 194.8 lb

## 2016-09-07 DIAGNOSIS — K648 Other hemorrhoids: Secondary | ICD-10-CM

## 2016-09-07 DIAGNOSIS — D509 Iron deficiency anemia, unspecified: Secondary | ICD-10-CM

## 2016-09-07 MED ORDER — DEXLANSOPRAZOLE 60 MG PO CPDR: 60.0000 mg | DELAYED_RELEASE_CAPSULE | Freq: Every day | ORAL | 2 refills | Status: DC

## 2016-09-07 NOTE — Progress Notes (Signed)
Primary Care Physician: [name], MD  Primary Gastroenterologist:  Dr. [name]   Chief Complaint  Patient presents with  . Follow-up  . Rectal Bleeding    HPI: Christina French is a 61 y.o. female    She is here today for a hospital follow up   Summary of history :  She was seen by myself when admitted on 08/03/16 for rectal bleeding with blood mixed with stool for more than a week , no nsaid use , appears she was investigated back in 2016 for iron deficiency anemia and was advised a capsule study which she didn't obtain. She has a history of colon polyps. .On admission she had a HB of 9 grams, MCV 80 . 8 months back Hb 10.3 She ws found to have iron deficiency anemia . I performed a colonoscopy which was normal except for large internal hemorrhoids, normal terminal ileum.   Interval history   08/05/2016-  09/07/2016   She says that when she "pees " sees blood in her bottom , when she wipes it is bright red in color. No peri anal itching . No constipation. Occurs on and off. She has had hemorroidal treatment in the past which she says didn't work. She is not on any blood thinners. No abdominal surgeries  Except a C section. She has not tried a supposittory .     Current Outpatient Prescriptions  Medication Sig Dispense Refill  . acetaminophen (TYLENOL) 500 MG tablet Take 1,000 mg by mouth 2 (two) times daily as needed for mild pain or headache.    . ALPRAZolam (XANAX) 1 MG tablet Take 1 mg by mouth 3 (three) times daily as needed for anxiety.     Marland Kitchen aspirin EC 325 MG tablet Take 1 tablet (325 mg total) by mouth 2 (two) times daily. (Patient not taking: Reported on 08/03/2016) 30 tablet 0  . atenolol (TENORMIN) 50 MG tablet Take 50 mg by mouth 2 (two) times daily.    . carbamazepine (TEGRETOL) 200 MG tablet Take 300 mg by mouth 2 (two) times daily.     . cyclobenzaprine (FLEXERIL) 10 MG tablet Take 10 mg by mouth 2 (two) times daily as needed for muscle spasms.    Marland Kitchen dexlansoprazole  (DEXILANT) 60 MG capsule Take by mouth.    . escitalopram (LEXAPRO) 20 MG tablet Take 20 mg by mouth daily.     . ferrous sulfate 325 (65 FE) MG tablet Take 1 tablet (325 mg total) by mouth 2 (two) times daily with a meal. 60 tablet 0  . hyoscyamine (LEVSIN, ANASPAZ) 0.125 MG tablet Take 1 tablet (0.125 mg total) by mouth 4 (four) times daily as needed for cramping. 120 tablet 5  . levETIRAcetam (KEPPRA) 500 MG tablet Take 500 mg by mouth 2 (two) times daily.    . mirtazapine (REMERON) 30 MG tablet Take 30 mg by mouth at bedtime.     . ondansetron (ZOFRAN) 8 MG tablet Take 8 mg by mouth 2 (two) times daily as needed for nausea or vomiting.     Marland Kitchen oxyCODONE-acetaminophen (ROXICET) 5-325 MG tablet Take 1 tablet by mouth every 4 (four) hours as needed. (Patient not taking: Reported on 08/03/2016) 60 tablet 0  . pantoprazole (PROTONIX) 40 MG tablet Take 40 mg by mouth daily.    . traZODone (DESYREL) 50 MG tablet Take 50 mg by mouth at bedtime.     No current facility-administered medications for this visit.     Allergies as  of 09/07/2016 - Review Complete 09/07/2016  Allergen Reaction Noted  . No known allergies  11/17/2015    ROS:  General: Negative for anorexia, weight loss, fever, chills, fatigue, weakness. ENT: Negative for hoarseness, difficulty swallowing , nasal congestion. CV: Negative for chest pain, angina, palpitations, dyspnea on exertion, peripheral edema.  Respiratory: Negative for dyspnea at rest, dyspnea on exertion, cough, sputum, wheezing.  GI: See history of present illness. GU:  Negative for dysuria, hematuria, urinary incontinence, urinary frequency, nocturnal urination.  Endo: Negative for unusual weight change.    Physical Examination:   BP 124/82 (BP Location: Left Arm, Patient Position: Sitting, Cuff Size: Large)   Pulse 76   Temp 98 F (36.7 C) (Oral)   Ht 5\' 1"  (1.549 m)   Wt 194 lb 12.8 oz (88.4 kg)   BMI 36.81 kg/m   General: Well-nourished,  well-developed in no acute distress.  Eyes: No icterus. Conjunctivae pink. Mouth: Oropharyngeal mucosa moist and pink , no lesions erythema or exudate. Lungs: Clear to auscultation bilaterally. Non-labored. Heart: Regular rate and rhythm, no murmurs rubs or gallops.  Abdomen: Bowel sounds are normal, nontender, nondistended, no hepatosplenomegaly or masses, no abdominal bruits or hernia , no rebound or guarding.   Extremities: No lower extremity edema. No clubbing or deformities. Neuro: Alert and oriented x 3.  Grossly intact. Skin: Warm and dry, no jaundice.   Psych: Alert and cooperative, normal mood and affect.   Imaging Studies: No results found.  Assessment and Plan:   Christina French is a 61 y.o. y/o female for iron deficiency anemia with no overt blood loss. Normal colonoscopy except for internal hemorroids , received IV iron in 07/2016 . Likely present bleeding is from internal hemorrhoids. For iron deficieny anemia needs EGD+ small bowel evaluation .   Plan   1. EGD and if negative capsule study  2. Recheck CBC 3. Anusol supp which she had at home for 7 days  4. Refer to colorectal surgery at [facility] for further treatment of bleeding internal hemorrhoids.   I have discussed alternative options, risks & benefits,  which include, but are not limited to, bleeding, infection, perforation,respiratory complication & drug reaction.  The patient agrees with this plan & written consent will be obtained.      Dr [name]  MD Follow up in 8-10 weeks

## 2016-09-09 ENCOUNTER — Telehealth: Payer: Self-pay | Admitting: Gastroenterology

## 2016-09-09 NOTE — Telephone Encounter (Signed)
09/09/16 8:33 am Spoke with Jenny Reichmann at [facility] and NO prior Auth required for EGD 43235 and Capsule Study 91110 / D50.9. Ref #: [id number].

## 2016-09-22 ENCOUNTER — Telehealth: Payer: Self-pay | Admitting: Gastroenterology

## 2016-09-22 NOTE — Telephone Encounter (Signed)
Patient called and wants to cx her procedure on 10/01/16. She feels like she can't properly prep due to having walking difficulties and needing a cane.

## 2016-09-23 ENCOUNTER — Telehealth: Payer: Self-pay

## 2016-09-23 NOTE — Telephone Encounter (Signed)
LVM for patient concerning the receipt of following phone message:  Patient called and wants to cx her procedure on 10/01/16. She feels like she can't properly prep due to having walking difficulties and needing a cane.  Advised pt to callback to reschedule or cancel per request.

## 2016-09-24 ENCOUNTER — Other Ambulatory Visit: Payer: Self-pay

## 2016-09-24 DIAGNOSIS — R109 Unspecified abdominal pain: Secondary | ICD-10-CM

## 2016-09-24 MED ORDER — HYOSCYAMINE SULFATE 0.125 MG PO TABS
0.1250 mg | ORAL_TABLET | Freq: Four times a day (QID) | ORAL | 5 refills | Status: DC | PRN
Start: 2016-09-24 — End: 2018-12-20

## 2016-09-30 ENCOUNTER — Emergency Department: Payer: No Typology Code available for payment source

## 2016-09-30 ENCOUNTER — Emergency Department
Admission: EM | Admit: 2016-09-30 | Discharge: 2016-09-30 | Disposition: A | Discharge status: Home / Self Care | Payer: No Typology Code available for payment source | Attending: Emergency Medicine | Admitting: Emergency Medicine

## 2016-09-30 DIAGNOSIS — B379 Candidiasis, unspecified: Secondary | ICD-10-CM

## 2016-09-30 DIAGNOSIS — Z85828 Personal history of other malignant neoplasm of skin: Secondary | ICD-10-CM | POA: Insufficient documentation

## 2016-09-30 DIAGNOSIS — B373 Candidiasis of vulva and vagina: Secondary | ICD-10-CM | POA: Diagnosis not present

## 2016-09-30 DIAGNOSIS — Y999 Unspecified external cause status: Secondary | ICD-10-CM | POA: Insufficient documentation

## 2016-09-30 DIAGNOSIS — Y9389 Activity, other specified: Secondary | ICD-10-CM | POA: Insufficient documentation

## 2016-09-30 DIAGNOSIS — R569 Unspecified convulsions: Secondary | ICD-10-CM

## 2016-09-30 DIAGNOSIS — S60811A Abrasion of right wrist, initial encounter: Secondary | ICD-10-CM | POA: Diagnosis not present

## 2016-09-30 DIAGNOSIS — I10 Essential (primary) hypertension: Secondary | ICD-10-CM | POA: Diagnosis not present

## 2016-09-30 DIAGNOSIS — S50312A Abrasion of left elbow, initial encounter: Secondary | ICD-10-CM | POA: Insufficient documentation

## 2016-09-30 DIAGNOSIS — G40909 Epilepsy, unspecified, not intractable, without status epilepticus: Secondary | ICD-10-CM | POA: Diagnosis not present

## 2016-09-30 DIAGNOSIS — Y92481 Parking lot as the place of occurrence of the external cause: Secondary | ICD-10-CM | POA: Insufficient documentation

## 2016-09-30 DIAGNOSIS — S59902A Unspecified injury of left elbow, initial encounter: Secondary | ICD-10-CM | POA: Diagnosis present

## 2016-09-30 DIAGNOSIS — I517 Cardiomegaly: Secondary | ICD-10-CM | POA: Diagnosis not present

## 2016-09-30 LAB — CBC WITH DIFFERENTIAL/PLATELET
BASOS ABS: 0 10*3/uL (ref 0–0.1)
BASOS PCT: 1 %
Eosinophils Absolute: 0.1 10*3/uL (ref 0–0.7)
Eosinophils Relative: 2 %
HEMATOCRIT: 34.5 % — AB (ref 35.0–47.0)
HEMOGLOBIN: 12.1 g/dL (ref 12.0–16.0)
LYMPHS PCT: 17 %
Lymphs Abs: 1 10*3/uL (ref 1.0–3.6)
MCH: 29.7 pg (ref 26.0–34.0)
MCHC: 35 g/dL (ref 32.0–36.0)
MCV: 84.7 fL (ref 80.0–100.0)
MONO ABS: 0.5 10*3/uL (ref 0.2–0.9)
MONOS PCT: 8 %
NEUTROS ABS: 4.4 10*3/uL (ref 1.4–6.5)
NEUTROS PCT: 72 %
Platelets: 178 10*3/uL (ref 150–440)
RBC: 4.07 MIL/uL (ref 3.80–5.20)
RDW: 15.3 % — ABNORMAL HIGH (ref 11.5–14.5)
WBC: 6 10*3/uL (ref 3.6–11.0)

## 2016-09-30 LAB — URINALYSIS, COMPLETE (UACMP) WITH MICROSCOPIC
BACTERIA UA: NONE SEEN
Bilirubin Urine: NEGATIVE
Glucose, UA: NEGATIVE mg/dL
Ketones, ur: NEGATIVE mg/dL
Leukocytes, UA: NEGATIVE
Nitrite: NEGATIVE
PROTEIN: NEGATIVE mg/dL
Specific Gravity, Urine: 1.015 (ref 1.005–1.030)
WBC UA: NONE SEEN WBC/hpf (ref 0–5)
pH: 6 (ref 5.0–8.0)

## 2016-09-30 LAB — HEPATIC FUNCTION PANEL
ALBUMIN: 4.5 g/dL (ref 3.5–5.0)
ALK PHOS: 73 U/L (ref 38–126)
ALT: 17 U/L (ref 14–54)
AST: 31 U/L (ref 15–41)
BILIRUBIN TOTAL: 0.4 mg/dL (ref 0.3–1.2)
Bilirubin, Direct: 0.1 mg/dL (ref 0.1–0.5)
Indirect Bilirubin: 0.3 mg/dL (ref 0.3–0.9)
Total Protein: 7.2 g/dL (ref 6.5–8.1)

## 2016-09-30 LAB — BASIC METABOLIC PANEL
ANION GAP: 10 (ref 5–15)
BUN: 15 mg/dL (ref 6–20)
CO2: 25 mmol/L (ref 22–32)
Calcium: 9.2 mg/dL (ref 8.9–10.3)
Chloride: 92 mmol/L — ABNORMAL LOW (ref 101–111)
Creatinine, Ser: 0.85 mg/dL (ref 0.44–1.00)
GFR calc non Af Amer: >60 mL/min (ref >60)
Glucose, Bld: 157 mg/dL — ABNORMAL HIGH (ref 65–99)
Potassium: 3.9 mmol/L (ref 3.5–5.1)
Sodium: 127 mmol/L — ABNORMAL LOW (ref 135–145)

## 2016-09-30 LAB — LIPASE, BLOOD: Lipase: 28 U/L (ref 11–51)

## 2016-09-30 LAB — RAPID HIV SCREEN (HIV 1/2 AB+AG)
HIV 1/2 ANTIBODIES: NONREACTIVE
HIV-1 P24 ANTIGEN - HIV24: NONREACTIVE

## 2016-09-30 LAB — CARBAMAZEPINE LEVEL, TOTAL: CARBAMAZEPINE LVL: 9 ug/mL (ref 4.0–12.0)

## 2016-09-30 MED ORDER — TETANUS-DIPHTH-ACELL PERTUSSIS 5-2.5-18.5 LF-MCG/0.5 IM SUSP
0.5000 mL | Freq: Once | INTRAMUSCULAR | Status: AC
  Administered 2016-09-30: 0.5 mL via INTRAMUSCULAR
  Filled 2016-09-30: qty 0.5

## 2016-09-30 MED ORDER — SODIUM CHLORIDE 0.9 % IV BOLUS (SEPSIS)
1000.0000 mL | Freq: Once | INTRAVENOUS | Status: AC
Start: 2016-09-30 — End: 2016-09-30
  Administered 2016-09-30: 1000 mL via INTRAVENOUS

## 2016-09-30 MED ORDER — LEVETIRACETAM 500 MG PO TABS
1000.0000 mg | ORAL_TABLET | Freq: Once | ORAL | Status: AC
  Administered 2016-09-30: 1000 mg via ORAL
  Filled 2016-09-30: qty 2

## 2016-09-30 MED ORDER — FLUCONAZOLE 50 MG PO TABS
150.0000 mg | ORAL_TABLET | Freq: Once | ORAL | Status: AC
  Administered 2016-09-30: 17:00:00 150 mg via ORAL
  Filled 2016-09-30: qty 1
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
## 2016-10-01 ENCOUNTER — Ambulatory Visit: Admission: RE | Admit: 2016-10-01 | Payer: Medicare Other | Source: Ambulatory Visit | Admitting: Gastroenterology

## 2016-10-01 ENCOUNTER — Encounter: Admission: RE | Payer: Self-pay | Source: Ambulatory Visit

## 2016-10-01 LAB — HEPATITIS C ANTIBODY (REFLEX): HCV AB: 0.1 {s_co_ratio} (ref 0.0–0.9)

## 2016-10-01 LAB — HEPATITIS B DNA, ULTRAQUANTITATIVE, PCR
HBV DNA SERPL PCR-ACNC: NOT DETECTED IU/mL
HBV DNA SERPL PCR-LOG IU: UNDETERMINED {Log_IU}/mL

## 2016-10-01 LAB — HCV COMMENT:

## 2016-10-01 LAB — HEPATITIS B SURFACE ANTIBODY, QUANTITATIVE: Hepatitis B-Post: <3.1 m[IU]/mL — ABNORMAL LOW (ref >9.9)

## 2016-10-01 LAB — HEPATITIS B CORE ANTIBODY, IGM: HEP B C IGM: NEGATIVE

## 2016-10-01 LAB — HEPATITIS B CORE ANTIBODY, TOTAL: HEP B C TOTAL AB: NEGATIVE

## 2016-10-01 SURGERY — IMAGING PROCEDURE, GI TRACT, INTRALUMINAL, VIA CAPSULE
Anesthesia: General

## 2016-10-06 ENCOUNTER — Telehealth: Payer: Self-pay | Admitting: Emergency Medicine

## 2016-10-06 NOTE — Telephone Encounter (Signed)
Called patient to give results of source testing done in ED.  Explained that hep b shows she is not immune and she should ask pcp if she needs more vaccine or not.  She agrees.

## 2016-12-08 DIAGNOSIS — K580 Irritable bowel syndrome with diarrhea: Secondary | ICD-10-CM | POA: Diagnosis not present

## 2016-12-08 DIAGNOSIS — I10 Essential (primary) hypertension: Secondary | ICD-10-CM | POA: Diagnosis not present

## 2016-12-22 IMAGING — CR DG CHEST 2V
1 series · 2 of 2 positions shown · non-contrast
Comparison: 09/05/2015

CLINICAL DATA: Pneumonia

EXAM:
CHEST  2 VIEW

[Series 1: dg chest 2 view · 0.14mm/px · 2 of 2 slices shown]
[im 1/2]
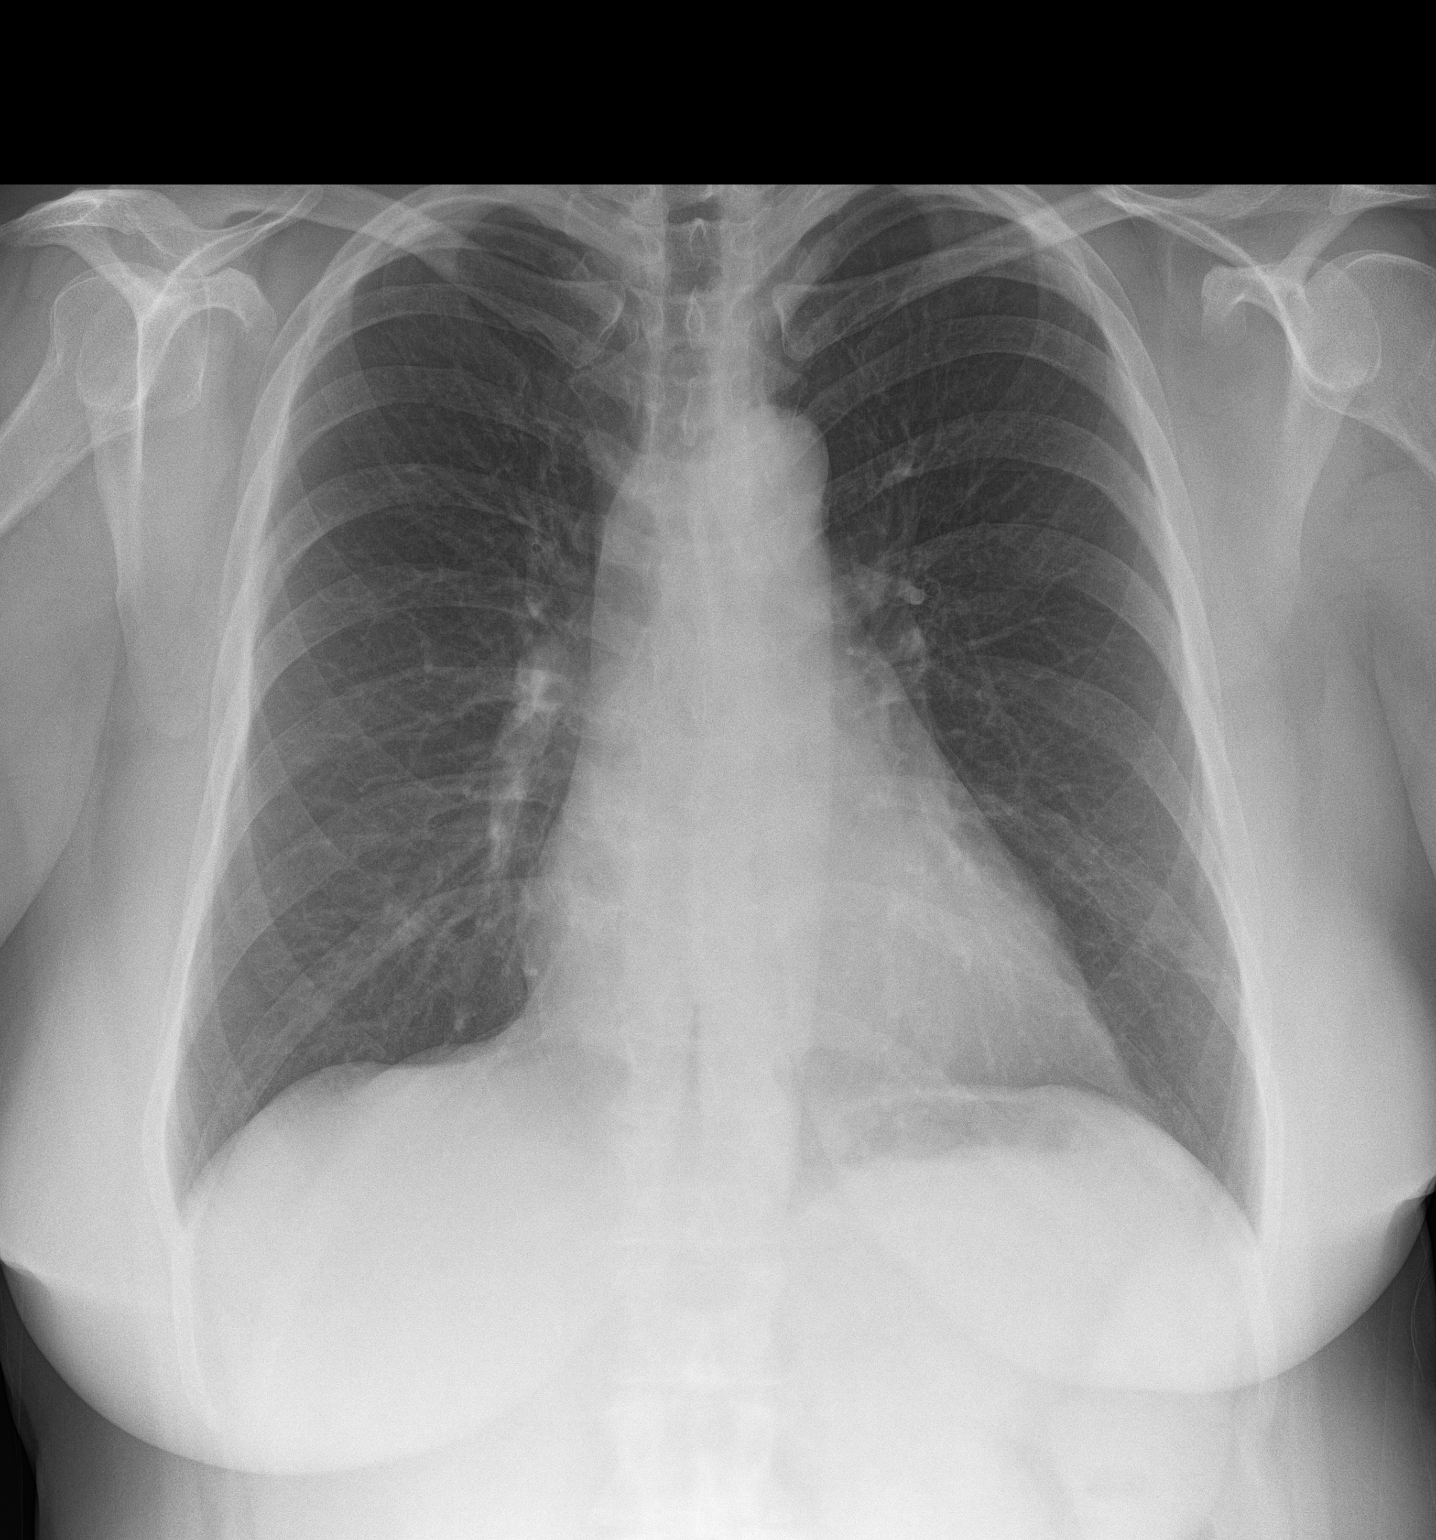
[im 2/2]
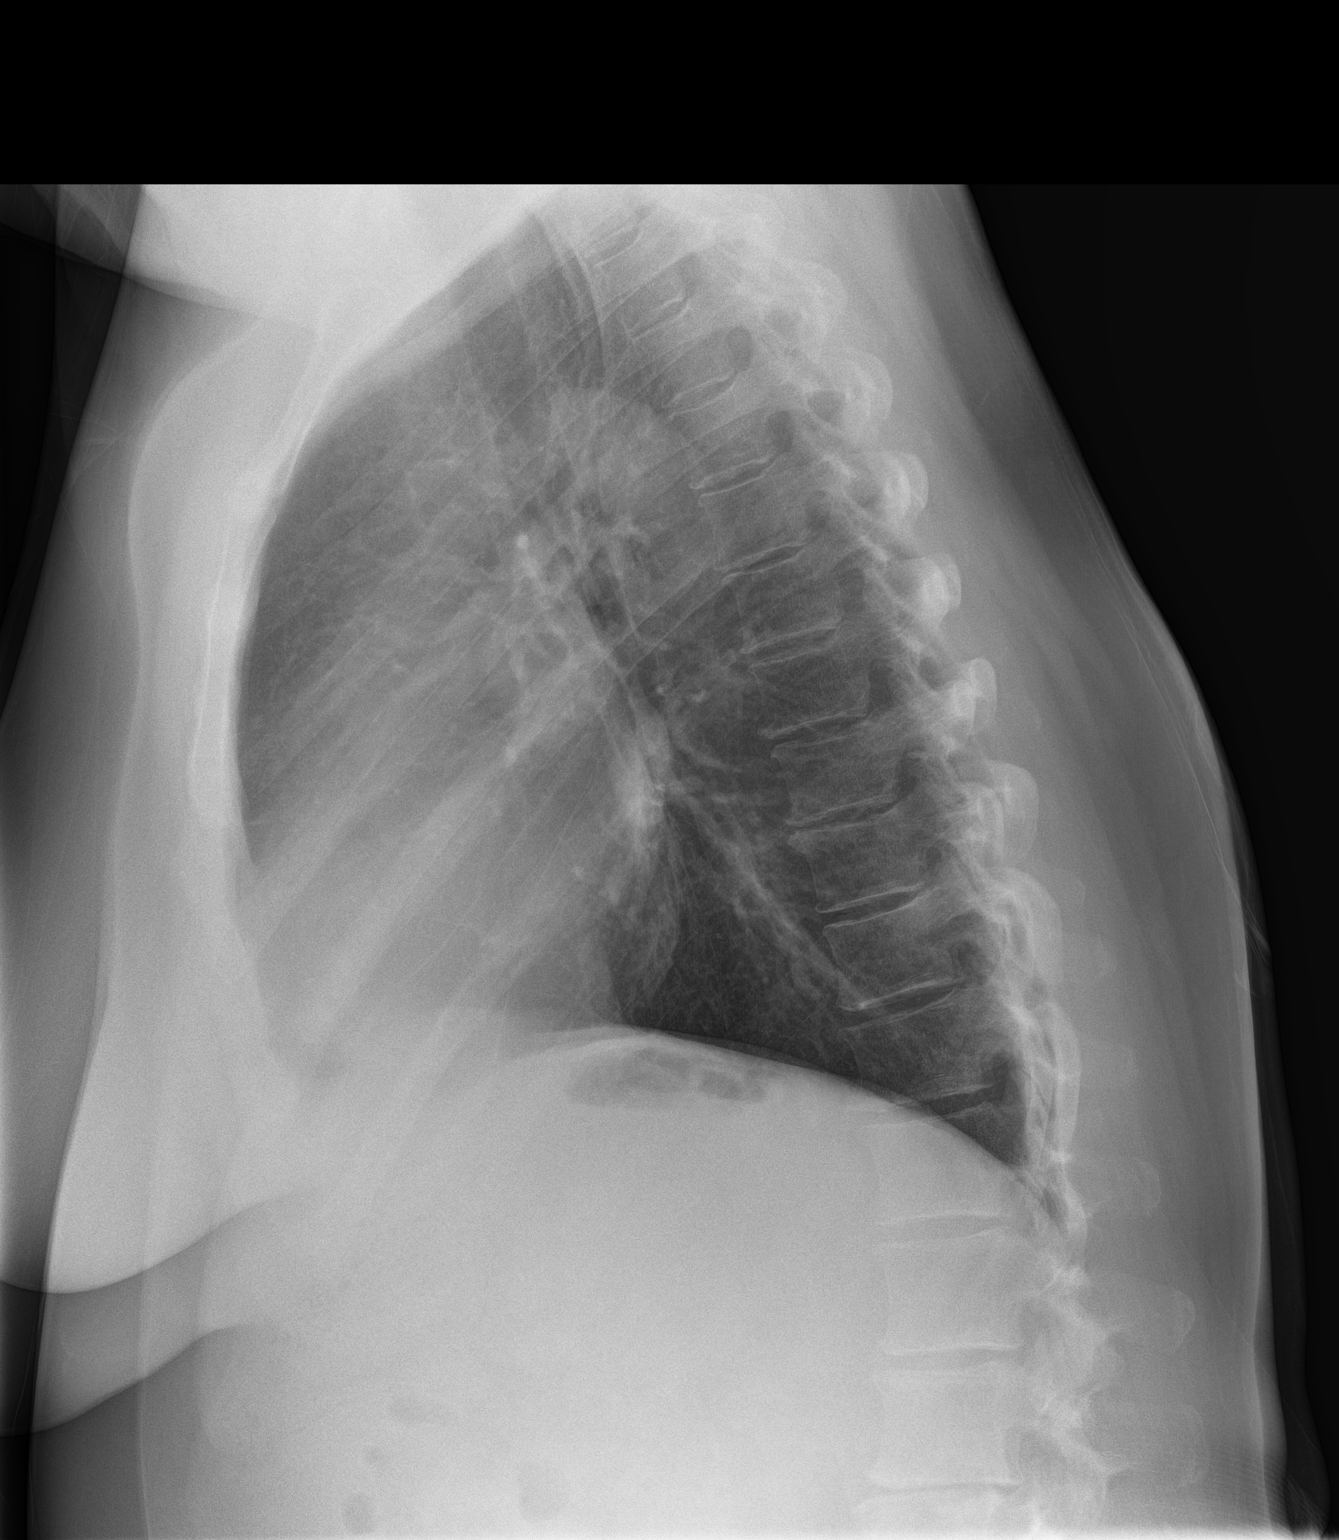

[2 of 2 positions shown; findings below may reference images not displayed]

FINDINGS: Patchy density left lung base has cleared and may represent
pneumonia. Lungs are now clear without infiltrate or effusion. No
mass or adenopathy or heart failure.
IMPRESSION: Interval clearing of lingular infiltrate.

## 2017-03-11 ENCOUNTER — Other Ambulatory Visit: Payer: Self-pay

## 2017-03-11 MED ORDER — DEXLANSOPRAZOLE 60 MG PO CPDR: 60.0000 mg | DELAYED_RELEASE_CAPSULE | Freq: Every day | ORAL | 0 refills | Status: DC

## 2017-03-11 MED ORDER — ONDANSETRON HCL 8 MG PO TABS: 8.0000 mg | ORAL_TABLET | Freq: Two times a day (BID) | ORAL | 0 refills | Status: AC | PRN

## 2017-04-13 ENCOUNTER — Other Ambulatory Visit: Payer: Self-pay

## 2017-04-13 MED ORDER — CYCLOBENZAPRINE HCL 10 MG PO TABS: 10.0000 mg | ORAL_TABLET | Freq: Two times a day (BID) | ORAL | 1 refills | Status: DC | PRN

## 2017-04-16 ENCOUNTER — Other Ambulatory Visit: Payer: Self-pay

## 2017-04-16 ENCOUNTER — Other Ambulatory Visit: Payer: Self-pay | Admitting: Nurse Practitioner

## 2017-04-16 MED ORDER — ATENOLOL 50 MG PO TABS: 50.0000 mg | ORAL_TABLET | Freq: Two times a day (BID) | ORAL | 5 refills | Status: DC

## 2017-04-21 ENCOUNTER — Other Ambulatory Visit: Payer: Self-pay | Admitting: Gastroenterology

## 2017-04-25 ENCOUNTER — Encounter: Payer: Self-pay | Admitting: Intensive Care

## 2017-04-25 ENCOUNTER — Inpatient Hospital Stay
Admission: EM | Admit: 2017-04-25 | Discharge: 2017-04-27 | DRG: 378 | Disposition: A | Discharge status: Home / Self Care | Payer: Medicare Other | Attending: Internal Medicine | Admitting: Internal Medicine

## 2017-04-25 ENCOUNTER — Emergency Department: Payer: Medicare Other

## 2017-04-25 DIAGNOSIS — F419 Anxiety disorder, unspecified: Secondary | ICD-10-CM | POA: Diagnosis present

## 2017-04-25 DIAGNOSIS — K649 Unspecified hemorrhoids: Secondary | ICD-10-CM | POA: Diagnosis present

## 2017-04-25 DIAGNOSIS — M1990 Unspecified osteoarthritis, unspecified site: Secondary | ICD-10-CM | POA: Diagnosis not present

## 2017-04-25 DIAGNOSIS — R0602 Shortness of breath: Secondary | ICD-10-CM | POA: Diagnosis not present

## 2017-04-25 DIAGNOSIS — Z8719 Personal history of other diseases of the digestive system: Secondary | ICD-10-CM | POA: Diagnosis not present

## 2017-04-25 DIAGNOSIS — E871 Hypo-osmolality and hyponatremia: Secondary | ICD-10-CM | POA: Diagnosis not present

## 2017-04-25 DIAGNOSIS — K2270 Barrett's esophagus without dysplasia: Secondary | ICD-10-CM | POA: Diagnosis present

## 2017-04-25 DIAGNOSIS — D62 Acute posthemorrhagic anemia: Secondary | ICD-10-CM | POA: Diagnosis present

## 2017-04-25 DIAGNOSIS — K219 Gastro-esophageal reflux disease without esophagitis: Secondary | ICD-10-CM | POA: Diagnosis present

## 2017-04-25 DIAGNOSIS — E119 Type 2 diabetes mellitus without complications: Secondary | ICD-10-CM | POA: Diagnosis present

## 2017-04-25 DIAGNOSIS — T454X6A Underdosing of iron and its compounds, initial encounter: Secondary | ICD-10-CM | POA: Diagnosis not present

## 2017-04-25 DIAGNOSIS — K922 Gastrointestinal hemorrhage, unspecified: Principal | ICD-10-CM

## 2017-04-25 DIAGNOSIS — G40909 Epilepsy, unspecified, not intractable, without status epilepticus: Secondary | ICD-10-CM | POA: Diagnosis present

## 2017-04-25 DIAGNOSIS — R195 Other fecal abnormalities: Secondary | ICD-10-CM | POA: Diagnosis present

## 2017-04-25 DIAGNOSIS — D649 Anemia, unspecified: Secondary | ICD-10-CM | POA: Diagnosis not present

## 2017-04-25 DIAGNOSIS — Z79899 Other long term (current) drug therapy: Secondary | ICD-10-CM | POA: Diagnosis not present

## 2017-04-25 DIAGNOSIS — I10 Essential (primary) hypertension: Secondary | ICD-10-CM | POA: Diagnosis present

## 2017-04-25 DIAGNOSIS — F329 Major depressive disorder, single episode, unspecified: Secondary | ICD-10-CM | POA: Diagnosis present

## 2017-04-25 DIAGNOSIS — R0600 Dyspnea, unspecified: Secondary | ICD-10-CM | POA: Diagnosis not present

## 2017-04-25 DIAGNOSIS — Z96641 Presence of right artificial hip joint: Secondary | ICD-10-CM | POA: Diagnosis present

## 2017-04-25 DIAGNOSIS — D509 Iron deficiency anemia, unspecified: Secondary | ICD-10-CM | POA: Diagnosis not present

## 2017-04-25 DIAGNOSIS — F4310 Post-traumatic stress disorder, unspecified: Secondary | ICD-10-CM | POA: Diagnosis present

## 2017-04-25 HISTORY — DX: Type 2 diabetes mellitus without complications: E11.9

## 2017-04-25 LAB — BASIC METABOLIC PANEL
ANION GAP: 10 (ref 5–15)
BUN: 12 mg/dL (ref 6–20)
CHLORIDE: 90 mmol/L — AB (ref 101–111)
CO2: 23 mmol/L (ref 22–32)
Calcium: 8.7 mg/dL — ABNORMAL LOW (ref 8.9–10.3)
Creatinine, Ser: 0.88 mg/dL (ref 0.44–1.00)
GFR calc Af Amer: >60 mL/min (ref >60)
GFR calc non Af Amer: >60 mL/min (ref >60)
GLUCOSE: 160 mg/dL — AB (ref 65–99)
POTASSIUM: 4.1 mmol/L (ref 3.5–5.1)
Sodium: 123 mmol/L — ABNORMAL LOW (ref 135–145)

## 2017-04-25 LAB — TROPONIN I

## 2017-04-25 LAB — APTT: APTT: 27 s (ref 24–36)

## 2017-04-25 LAB — CBC
HCT: 17.6 % — ABNORMAL LOW (ref 35.0–47.0)
HEMOGLOBIN: 5.4 g/dL — AB (ref 12.0–16.0)
MCH: 19.9 pg — AB (ref 26.0–34.0)
MCHC: 30.9 g/dL — AB (ref 32.0–36.0)
MCV: 64.4 fL — AB (ref 80.0–100.0)
Platelets: 220 10*3/uL (ref 150–440)
RBC: 2.73 MIL/uL — ABNORMAL LOW (ref 3.80–5.20)
RDW: 17.4 % — ABNORMAL HIGH (ref 11.5–14.5)
WBC: 4.7 10*3/uL (ref 3.6–11.0)

## 2017-04-25 LAB — GLUCOSE, CAPILLARY
GLUCOSE-CAPILLARY: 116 mg/dL — AB (ref 65–99)
GLUCOSE-CAPILLARY: 111 mg/dL — AB (ref 65–99)

## 2017-04-25 LAB — PROTIME-INR
INR: 1.1
PROTHROMBIN TIME: 14.1 s (ref 11.4–15.2)

## 2017-04-25 MED ORDER — ACETAMINOPHEN 650 MG RE SUPP: 650.0000 mg | Freq: Four times a day (QID) | RECTAL | Status: DC | PRN

## 2017-04-25 MED ORDER — PANTOPRAZOLE SODIUM 40 MG IV SOLR: 40.0000 mg | Freq: Two times a day (BID) | INTRAVENOUS | Status: DC

## 2017-04-25 MED ORDER — GUAIFENESIN-DM 100-10 MG/5ML PO SYRP
5.0000 mL | ORAL_SOLUTION | ORAL | Status: DC | PRN
  Administered 2017-04-25 – 2017-04-26 (×2): 5 mL via ORAL
  Filled 2017-04-25 (×3): qty 5

## 2017-04-25 MED ORDER — ONDANSETRON HCL 4 MG/2ML IJ SOLN: 4.0000 mg | Freq: Four times a day (QID) | INTRAMUSCULAR | Status: DC | PRN

## 2017-04-25 MED ORDER — LEVETIRACETAM 500 MG PO TABS
500.0000 mg | ORAL_TABLET | Freq: Two times a day (BID) | ORAL | Status: DC
  Administered 2017-04-25 – 2017-04-27 (×4): 500 mg via ORAL
  Filled 2017-04-25 (×5): qty 1

## 2017-04-25 MED ORDER — BISACODYL 10 MG RE SUPP: 10.0000 mg | Freq: Every day | RECTAL | Status: DC | PRN

## 2017-04-25 MED ORDER — DOCUSATE SODIUM 100 MG PO CAPS
100.0000 mg | ORAL_CAPSULE | Freq: Two times a day (BID) | ORAL | Status: DC
  Administered 2017-04-25 – 2017-04-26 (×2): 100 mg via ORAL
  Filled 2017-04-25 (×4): qty 1

## 2017-04-25 MED ORDER — CARBAMAZEPINE 200 MG PO TABS
300.0000 mg | ORAL_TABLET | Freq: Two times a day (BID) | ORAL | Status: DC
  Administered 2017-04-25 – 2017-04-27 (×4): 300 mg via ORAL
  Filled 2017-04-25 (×5): qty 1.5

## 2017-04-25 MED ORDER — INSULIN ASPART 100 UNIT/ML ~~LOC~~ SOLN
0.0000 [IU] | Freq: Three times a day (TID) | SUBCUTANEOUS | Status: DC
  Administered 2017-04-26 – 2017-04-27 (×2): 1 [IU] via SUBCUTANEOUS
  Filled 2017-04-25 (×2): qty 1

## 2017-04-25 MED ORDER — ACETAMINOPHEN 325 MG PO TABS
650.0000 mg | ORAL_TABLET | Freq: Four times a day (QID) | ORAL | Status: DC | PRN
  Administered 2017-04-27: 09:00:00 650 mg via ORAL
  Filled 2017-04-25: qty 2

## 2017-04-25 MED ORDER — ALPRAZOLAM 1 MG PO TABS: 1.0000 mg | ORAL_TABLET | Freq: Three times a day (TID) | ORAL | Status: DC | PRN

## 2017-04-25 MED ORDER — TRAZODONE HCL 50 MG PO TABS
50.0000 mg | ORAL_TABLET | Freq: Every day | ORAL | Status: DC
  Administered 2017-04-25 – 2017-04-26 (×2): 50 mg via ORAL
  Filled 2017-04-25 (×2): qty 1

## 2017-04-25 MED ORDER — SODIUM CHLORIDE 0.9 % IV SOLN: 10.0000 mL/h | Freq: Once | INTRAVENOUS | Status: DC

## 2017-04-25 MED ORDER — HYOSCYAMINE SULFATE 0.125 MG PO TABS
0.1250 mg | ORAL_TABLET | Freq: Four times a day (QID) | ORAL | Status: DC | PRN
  Filled 2017-04-25: qty 1

## 2017-04-25 MED ORDER — ESCITALOPRAM OXALATE 20 MG PO TABS
20.0000 mg | ORAL_TABLET | Freq: Every day | ORAL | Status: DC
  Administered 2017-04-25 – 2017-04-27 (×3): 20 mg via ORAL
  Filled 2017-04-25 (×3): qty 1

## 2017-04-25 MED ORDER — SODIUM CHLORIDE 0.9 % IV SOLN
INTRAVENOUS | Status: DC
  Administered 2017-04-25: 19:00:00 via INTRAVENOUS

## 2017-04-25 MED ORDER — ONDANSETRON HCL 4 MG PO TABS: 4.0000 mg | ORAL_TABLET | Freq: Four times a day (QID) | ORAL | Status: DC | PRN

## 2017-04-25 MED ORDER — MIRTAZAPINE 15 MG PO TABS
30.0000 mg | ORAL_TABLET | Freq: Every day | ORAL | Status: DC
  Administered 2017-04-25 – 2017-04-26 (×2): 30 mg via ORAL
  Filled 2017-04-25 (×2): qty 2

## 2017-04-25 MED ORDER — FERROUS SULFATE 325 (65 FE) MG PO TABS
325.0000 mg | ORAL_TABLET | Freq: Three times a day (TID) | ORAL | Status: DC
  Administered 2017-04-25 – 2017-04-27 (×3): 325 mg via ORAL
  Filled 2017-04-25 (×4): qty 1

## 2017-04-25 MED ORDER — CYCLOBENZAPRINE HCL 10 MG PO TABS: 10.0000 mg | ORAL_TABLET | Freq: Two times a day (BID) | ORAL | Status: DC | PRN

## 2017-04-25 MED ORDER — ATENOLOL 50 MG PO TABS
50.0000 mg | ORAL_TABLET | Freq: Two times a day (BID) | ORAL | Status: DC
  Administered 2017-04-25 – 2017-04-26 (×2): 50 mg via ORAL
  Filled 2017-04-25 (×5): qty 1

## 2017-04-25 MED ORDER — SODIUM CHLORIDE 0.9 % IV SOLN
8.0000 mg/h | INTRAVENOUS | Status: DC
  Administered 2017-04-25 – 2017-04-26 (×2): 8 mg/h via INTRAVENOUS
  Filled 2017-04-25 (×2): qty 80

## 2017-04-25 MED ORDER — SODIUM CHLORIDE 0.9 % IV SOLN
80.0000 mg | Freq: Once | INTRAVENOUS | Status: AC
  Administered 2017-04-25: 17:00:00 80 mg via INTRAVENOUS
  Filled 2017-04-25: qty 80

## 2017-04-25 NOTE — H&P (Signed)
History and Physical    Christina French EPP:[id number] DOB: [birth year] DOA: 04/25/2017  Referring physician: Dr. Alfred Levins PCP: [name], MD  Specialists: none  Chief Complaint: weakness and SOB with dizziness  HPI: Christina French is a 61 y.o. female has a past medical history significant for HTN, DM, PTSD and previous LGI bleed from hemorrhoids now with progressive weakness with SOB and dizziness. Has noted BRBPR as well. In ER, hgb=5.4 with dark, guaiac positive stools. She denies CP or palpitations. Has not been taking her iron. Some nausea but no vomiting or diarrhea. She is now admitted  Review of Systems: The patient denies anorexia, fever, weight loss,, vision loss, decreased hearing, hoarseness, chest pain, syncope, peripheral edema, balance deficits, hemoptysis, abdominal pain, melena, severe indigestion/heartburn, hematuria, incontinence, genital sores, muscle weakness, suspicious skin lesions, transient blindness, difficulty walking, depression, unusual weight change, abnormal bleeding, enlarged lymph nodes, angioedema, and breast masses.   Past Medical History:  Diagnosis Date  . Anemia   . Anxiety   . Arthritis   . Barrett esophagus   . Cancer ([place]) 2016    Skin cancer right leg mylenoma  . Depression   . Diabetes ([place]) 04/25/2017  . GERD (gastroesophageal reflux disease)   . History of blood transfusion    with a surgery  . History of bronchitis   . History of pneumonia   . Hypertension   . IBS (irritable bowel syndrome)   . Pneumonia 09/27/15  . PTSD (post-traumatic stress disorder)   . Renal insufficiency    reports acute kidney injury  . Seizures ([place])    rare - last one 07/30/14 - was very anemic.  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 Vertigo    thinks related to sinus issues   Past Surgical History:  Procedure Laterality Date  . ABDOMINAL HYSTERECTOMY    . CESAREAN SECTION    . COLONOSCOPY N/A 08/31/2014   Procedure: COLONOSCOPY;  Surgeon: Lucilla Lame, MD;   Location: [place];  Service: Gastroenterology;  Laterality: N/A;  . COLONOSCOPY N/A 08/05/2016   Procedure: COLONOSCOPY;  Surgeon: Christina Bellows, MD;  Location: [facility];  Service: Endoscopy;  Laterality: N/A;  . CYST EXCISION  2011   from throat  . ESOPHAGOGASTRODUODENOSCOPY N/A 08/31/2014   Procedure: ESOPHAGOGASTRODUODENOSCOPY (EGD);  Surgeon: Lucilla Lame, MD;  Location: [place];  Service: Gastroenterology;  Laterality: N/A;  . HEMORRHOID SURGERY    . INCISION AND DRAINAGE HIP Right 11/25/2014   Procedure: IRRIGATION  AND DRAINAGEOF RIGHT HIP WITH PLACEMENT OF ANTIBIOUTIC BEADS;  Surgeon: Mcarthur Rossetti, MD;  Location: [place];  Service: Orthopedics;  Laterality: Right;  . INCISION AND DRAINAGE HIP Right 11/28/2014   Procedure: Repeat I&D Right Hip;  Surgeon: Mcarthur Rossetti, MD;  Location: [place];  Service: Orthopedics;  Laterality: Right;  . JOINT REPLACEMENT Right 2007   hip  . TOTAL HIP REVISION Right 03/19/2015   Procedure: RIGHT TOTAL HIP ARTHROPLASTY REVISION;  Surgeon: Meredith Pel, MD;  Location: [place];  Service: Orthopedics;  Laterality: Right;  . TOTAL HIP REVISION Right 11/18/2015   Procedure: TOTAL HIP REVISION;  Surgeon: Frederik Pear, MD;  Location: [place];  Service: Orthopedics;  Laterality: Right;   Social History:  reports that  has never smoked. she has never used smokeless tobacco. She reports that she does not drink alcohol or use drugs.  Allergies  Allergen Reactions  . No Known Allergies     Family History  Problem  Relation Age of Onset  . Alcoholism Father   . Throat cancer Mother     Prior to Admission medications   Medication Sig Start Date End Date Taking? Authorizing Provider  acetaminophen (TYLENOL) 500 MG tablet Take 1,000 mg by mouth 2 (two) times daily as needed for mild pain or headache.    [provider]  ALPRAZolam Duanne Moron) 1 MG tablet Take 1 mg by mouth 3 (three) times daily as needed for anxiety.      [provider]  aspirin EC 325 MG tablet Take 1 tablet (325 mg total) by mouth 2 (two) times daily. Patient not taking: Reported on 08/03/2016 11/20/15   Christina Parody, PA-C  atenolol (TENORMIN) 50 MG tablet Take 1 tablet (50 mg total) by mouth 2 (two) times daily. 04/16/17   Christina Freshwater, NP  carbamazepine (TEGRETOL) 200 MG tablet Take 300 mg by mouth 2 (two) times daily.     [provider]  cyclobenzaprine (FLEXERIL) 10 MG tablet Take 1 tablet (10 mg total) by mouth 2 (two) times daily as needed for muscle spasms. 04/13/17   Christina Freshwater, NP  DEXILANT 60 MG capsule Take 1 capsule (60 mg total) by mouth daily. 04/21/17   Christina Bellows, MD  dexlansoprazole [facility]) 60 MG capsule Take 1 capsule (60 mg total) daily by mouth. 03/11/17 05/11/17  Christina Bellows, MD  escitalopram (LEXAPRO) 20 MG tablet Take 20 mg by mouth daily.     [provider]  ferrous sulfate 325 (65 FE) MG tablet Take 1 tablet (325 mg total) by mouth 2 (two) times daily with a meal. 08/05/16   Christina, Alveta Heimlich, MD  hyoscyamine (LEVSIN, ANASPAZ) 0.125 MG tablet Take 1 tablet (0.125 mg total) by mouth 4 (four) times daily as needed for cramping. 09/24/16   Christina Bellows, MD  levETIRAcetam (KEPPRA) 500 MG tablet Take 500 mg by mouth 2 (two) times daily. 05/08/16 05/08/17  [provider]  mirtazapine (REMERON) 30 MG tablet Take 30 mg by mouth at bedtime.     [provider]  oxyCODONE-acetaminophen (ROXICET) 5-325 MG tablet Take 1 tablet by mouth every 4 (four) hours as needed. Patient not taking: Reported on 08/03/2016 11/20/15   Christina Parody, PA-C  pantoprazole (PROTONIX) 40 MG tablet Take 40 mg by mouth daily.    [provider]  traZODone (DESYREL) 50 MG tablet Take 50 mg by mouth at bedtime.    [provider]   Physical Exam: Vitals:   04/25/17 1605 04/25/17 1606  BP: (!) 182/95   Pulse: 91   Resp: 16   Temp: 98.6 F (37 C)   TempSrc: Oral   SpO2: 100%    Weight:  86.2 kg (190 lb)  Height:  5\' 2"  (1.575 m)     General:  No apparent distress, WDWN, [place]/AT  Eyes: PERRL, EOMI, no scleral icterus, conjunctiva pale  ENT: moist oropharynx without exudate, TM's benign, dentition good  Neck: supple, no lymphadenopathy . No bruits or thyromegaly  Cardiovascular: regular rate without MRG; 2+ peripheral pulses, no JVD, no peripheral edema  Respiratory: CTA biL, good air movement without wheezing, rhonchi or crackled. Respiratory effort normal  Abdomen: soft, non tender to palpation, positive bowel sounds, no guarding, no rebound  Skin: no rashes or lesions  Musculoskeletal: normal bulk and tone, no joint swelling  Psychiatric: normal mood and affect, A&OX3  Neurologic: CN 2-12 grossly intact, Motor strengthS 5/5 in all 4 groups with symmetric DTR's and non-focal sensory  exam  Labs on Admission:  Basic Metabolic Panel: Recent Labs  Lab 04/25/17 1603  NA 123*  K 4.1  CL 90*  CO2 23  GLUCOSE 160*  BUN 12  CREATININE 0.88  CALCIUM 8.7*   Liver Function Tests: No results for input(s): AST, ALT, ALKPHOS, BILITOT, PROT, ALBUMIN in the last 168 hours. No results for input(s): LIPASE, AMYLASE in the last 168 hours. No results for input(s): AMMONIA in the last 168 hours. CBC: Recent Labs  Lab 04/25/17 1603  WBC 4.7  HGB 5.4*  HCT 17.6*  MCV 64.4*  PLT 220   Cardiac Enzymes: Recent Labs  Lab 04/25/17 1603  TROPONINI <0.03    BNP (last 3 results) No results for input(s): BNP in the last 8760 hours.  ProBNP (last 3 results) No results for input(s): PROBNP in the last 8760 hours.  CBG: No results for input(s): GLUCAP in the last 168 hours.  Radiological Exams on Admission: Dg Chest 2 View  Result Date: 04/25/2017 CLINICAL DATA:  New onset rectal bleeding, shortness of breath EXAM: CHEST  2 VIEW COMPARISON:  September 30, 2016 FINDINGS: The heart size and mediastinal contours are stable. There is no focal infiltrate,  pulmonary edema, or pleural effusion. The visualized skeletal structures are unremarkable. IMPRESSION: No active cardiopulmonary disease. Electronically Signed   By: Abelardo Diesel M.D.   On: 04/25/2017 16:47    EKG: Independently reviewed.  Assessment/Plan Principal Problem:   GI bleed Active Problems:   Symptomatic anemia   Hyponatremia   Diabetes ([place])   Will admit to floor with IV fluids and Protonix drip. Clear liquid diet. Transfuse 2u PRBC's and follow hgb closely. Consult GI. Follow sugars. Repeat labs in AM.  Diet: clear liquids Fluids: NS@75  DVT Prophylaxis: TED hose  Code Status: FULL  Family Communication: none  Disposition Plan: home  Time spent: 50 min

## 2017-04-25 NOTE — ED Triage Notes (Signed)
Patient arrived by EMS from home for SOB that started this afternoon and rectal bleeding since yesterday. Patient pale upon arrival. A&O x4  EMS blood sugar 136, RA sats 99%. Patient reports recently being put on antidepressant and hasnt been able to eat due to nausea.

## 2017-04-25 NOTE — ED Provider Notes (Signed)
[facility] Emergency Department Provider Note  ____________________________________________  Time seen: Approximately 4:12 PM  I have reviewed the triage vital signs and the nursing notes.   HISTORY  Chief Complaint Shortness of Breath and Rectal Bleeding   HPI Christina French is a 61 y.o. female with a history of iron deficiency anemia, rectal bleeding from internal hemorrhoids, IBS, hypertension who presents for evaluation of shortness of breath. The patient reports that she has been having rectal bleeding for 3 weeks. She denies melena. She reports large volume of red blood in the toilet every time she has a bowel movement which is usually every other day. Her last BM was yesterday. For the last 2 days patient started feeling more fatigued and with generalized weakness. She then started taking iron due to concerns of low hemoglobin. Patient is supposed to be on iron daily. She denies vomiting or hematemesis, she denies abdominal pain, chest pain, shortness of breath. She does feel dizzy. Patient has received several blood transfusions in the past. She is not on any blood thinners.No h/o PUD, no NSAIDs. Patient also complains of nausea and decreased appetite since being started on a new antidepressant which she stopped taking a week ago.  Past Medical History:  Diagnosis Date  . Anemia   . Anxiety   . Arthritis   . Barrett esophagus   . Cancer ([place]) 2016    Skin cancer right leg mylenoma  . Depression   . GERD (gastroesophageal reflux disease)   . History of blood transfusion    with a surgery  . History of bronchitis   . History of pneumonia   . Hypertension   . IBS (irritable bowel syndrome)   . Pneumonia 09/27/15  . PTSD (post-traumatic stress disorder)   . Renal insufficiency    reports acute kidney injury  . Seizures ([place])    rare - last one 07/30/14 - was very anemic.  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 Vertigo    thinks related to sinus issues     Patient Active Problem List   Diagnosis Date Noted  . Rectal bleeding 08/03/2016  . 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 Anemia 04/16/2015  . Anxiety disorder 03/20/2015  . Acute hyperglycemia 03/20/2015  . Pulse irregularity 03/20/2015  . Dehydration 03/20/2015  . Septic arthritis ([place])   . Prosthetic hip infection ([place])   . Staphylococcus aureus infection   . Enteritis due to Clostridium difficile   . Screen for STD (sexually transmitted disease)   . Acute pain of right hip; hip infection 11/24/2014  . Right hip pain 11/24/2014  . Chronic hyponatremia 11/24/2014  . Seizure disorder ([place]) 08/17/2013  . Digestive disorder 08/17/2013  . Hypertension 08/17/2013  . Hyperlipidemia 08/17/2013  . Seizure ([place]) 08/17/2013    Past Surgical History:  Procedure Laterality Date  . ABDOMINAL HYSTERECTOMY    . CESAREAN SECTION    . COLONOSCOPY N/A 08/31/2014   Procedure: COLONOSCOPY;  Surgeon: Lucilla Lame, MD;  Location: [place];  Service: Gastroenterology;  Laterality: N/A;  . COLONOSCOPY N/A 08/05/2016   Procedure: COLONOSCOPY;  Surgeon: [name], MD;  Location: [facility];  Service: Endoscopy;  Laterality: N/A;  . CYST EXCISION  2011   from throat  . ESOPHAGOGASTRODUODENOSCOPY N/A 08/31/2014   Procedure: ESOPHAGOGASTRODUODENOSCOPY (EGD);  Surgeon: Lucilla Lame, MD;  Location: [place];  Service: Gastroenterology;  Laterality: N/A;  . HEMORRHOID SURGERY    . INCISION AND DRAINAGE HIP Right 11/25/2014  Procedure: IRRIGATION  AND DRAINAGEOF RIGHT HIP WITH PLACEMENT OF ANTIBIOUTIC BEADS;  Surgeon: Mcarthur Rossetti, MD;  Location: [place];  Service: Orthopedics;  Laterality: Right;  . INCISION AND DRAINAGE HIP Right 11/28/2014   Procedure: Repeat I&D Right Hip;  Surgeon: Mcarthur Rossetti, MD;  Location: [place];  Service: Orthopedics;  Laterality: Right;  . JOINT REPLACEMENT Right 2007   hip  . TOTAL HIP REVISION Right 03/19/2015   Procedure: RIGHT  TOTAL HIP ARTHROPLASTY REVISION;  Surgeon: Meredith Pel, MD;  Location: [place];  Service: Orthopedics;  Laterality: Right;  . TOTAL HIP REVISION Right 11/18/2015   Procedure: TOTAL HIP REVISION;  Surgeon: Frederik Pear, MD;  Location: [place];  Service: Orthopedics;  Laterality: Right;    Prior to Admission medications   Medication Sig Start Date End Date Taking? Authorizing Provider  acetaminophen (TYLENOL) 500 MG tablet Take 1,000 mg by mouth 2 (two) times daily as needed for mild pain or headache.    [provider]  ALPRAZolam Duanne Moron) 1 MG tablet Take 1 mg by mouth 3 (three) times daily as needed for anxiety.     [provider]  aspirin EC 325 MG tablet Take 1 tablet (325 mg total) by mouth 2 (two) times daily. Patient not taking: Reported on 08/03/2016 11/20/15   Leighton Parody, PA-C  atenolol (TENORMIN) 50 MG tablet Take 1 tablet (50 mg total) by mouth 2 (two) times daily. 04/16/17   Ronnell Freshwater, NP  carbamazepine (TEGRETOL) 200 MG tablet Take 300 mg by mouth 2 (two) times daily.     [provider]  cyclobenzaprine (FLEXERIL) 10 MG tablet Take 1 tablet (10 mg total) by mouth 2 (two) times daily as needed for muscle spasms. 04/13/17   Ronnell Freshwater, NP  DEXILANT 60 MG capsule Take 1 capsule (60 mg total) by mouth daily. 04/21/17   [name], MD  dexlansoprazole [facility]) 60 MG capsule Take 1 capsule (60 mg total) daily by mouth. 03/11/17 05/11/17  [name], MD  escitalopram (LEXAPRO) 20 MG tablet Take 20 mg by mouth daily.     [provider]  ferrous sulfate 325 (65 FE) MG tablet Take 1 tablet (325 mg total) by mouth 2 (two) times daily with a meal. 08/05/16   Sudini, Alveta Heimlich, MD  hyoscyamine (LEVSIN, ANASPAZ) 0.125 MG tablet Take 1 tablet (0.125 mg total) by mouth 4 (four) times daily as needed for cramping. 09/24/16   [name], MD  levETIRAcetam (KEPPRA) 500 MG tablet Take 500 mg by mouth 2 (two) times daily. 05/08/16 05/08/17  [provider]  mirtazapine (REMERON) 30 MG tablet Take 30 mg by mouth at bedtime.     [provider]  oxyCODONE-acetaminophen (ROXICET) 5-325 MG tablet Take 1 tablet by mouth every 4 (four) hours as needed. Patient not taking: Reported on 08/03/2016 11/20/15   Leighton Parody, PA-C  pantoprazole (PROTONIX) 40 MG tablet Take 40 mg by mouth daily.    [provider]  traZODone (DESYREL) 50 MG tablet Take 50 mg by mouth at bedtime.    [provider]    Allergies No known allergies  Family History  Problem Relation Age of Onset  . Alcoholism Father   . Throat cancer Mother     Social History Social History   Tobacco Use  . Smoking status: Never Smoker  . Smokeless tobacco: Never Used  Substance Use Topics  . Alcohol use: No    Alcohol/week: 0.0 oz  .  Drug use: No    Review of Systems  Constitutional: Negative for fever. Eyes: Negative for visual changes. ENT: Negative for sore throat. Neck: No neck pain  Cardiovascular: Negative for chest pain. Respiratory: + shortness of breath. Gastrointestinal: Negative for abdominal pain, vomiting or diarrhea. + nausea and rectal bleeding Genitourinary: Negative for dysuria. Musculoskeletal: Negative for back pain. Skin: Negative for rash. Neurological: Negative for headaches, weakness or numbness. Psych: No SI or HI  ____________________________________________   PHYSICAL EXAM:  VITAL SIGNS: ED Triage Vitals  Enc Vitals Group     BP 04/25/17 1605 (!) 182/95     Pulse Rate 04/25/17 1605 91     Resp 04/25/17 1605 16     Temp 04/25/17 1605 98.6 F (37 C)     Temp Source 04/25/17 1605 Oral     SpO2 04/25/17 1605 100 %     Weight 04/25/17 1606 190 lb (86.2 kg)     Height 04/25/17 1606 5\' 2"  (1.575 m)     Head Circumference --      Peak Flow --      Pain Score --      Pain Loc --      Pain Edu? --      Excl. in [place]? --     Constitutional: Alert and oriented. Well appearing and in no apparent  distress. Patient looks pale. HEENT:      Head: Normocephalic and atraumatic.         Eyes: Conjunctivae are normal. Sclera is non-icteric.       Mouth/Throat: Mucous membranes are moist.       Neck: Supple with no signs of meningismus. Cardiovascular: Regular rate and rhythm. No murmurs, gallops, or rubs. 2+ symmetrical distal pulses are present in all extremities. No JVD. Respiratory: Normal respiratory effort. Lungs are clear to auscultation bilaterally. No wheezes, crackles, or rhonchi.  Gastrointestinal: Soft, non tender, and non distended with positive bowel sounds. No rebound or guarding. Rectal exam showing external hemorrhoids and black stool guaiac positive Musculoskeletal: Nontender with normal range of motion in all extremities. No edema, cyanosis, or erythema of extremities. Neurologic: Normal speech and language. Face is symmetric. Moving all extremities. No gross focal neurologic deficits are appreciated. Skin: Skin is warm, dry and intact. No rash noted. Psychiatric: Mood and affect are normal. Speech and behavior are normal.  ____________________________________________   LABS (all labs ordered are listed, but only abnormal results are displayed)  Labs Reviewed  CBC - Abnormal; Notable for the following components:      Result Value   RBC 2.73 (*)    Hemoglobin 5.4 (*)    HCT 17.6 (*)    MCV 64.4 (*)    MCH 19.9 (*)    MCHC 30.9 (*)    RDW 17.4 (*)    All other components within normal limits  BASIC METABOLIC PANEL  TROPONIN I  PROTIME-INR  APTT  PREPARE RBC (CROSSMATCH)   ____________________________________________  EKG  ED ECG REPORT I, Rudene Re, the attending physician, personally viewed and interpreted this ECG.  Normal sinus rhythm, rate of 88, normal intervals, normal axis, no ST elevations or depressions. Unchanged from prior from May 2017 ____________________________________________  RADIOLOGY  CXR: negative    ____________________________________________   PROCEDURES  Procedure(s) performed: None Procedures Critical Care performed: yes  CRITICAL CARE Performed by: Rudene Re  ?  Total critical care time: 30 min  Critical care time was exclusive of separately billable procedures and treating other patients.  Critical care was necessary to treat or prevent imminent or life-threatening deterioration.  Critical care was time spent personally by me on the following activities: development of treatment plan with patient and/or surrogate as well as nursing, discussions with consultants, evaluation of patient's response to treatment, examination of patient, obtaining history from patient or surrogate, ordering and performing treatments and interventions, ordering and review of laboratory studies, ordering and review of radiographic studies, pulse oximetry and re-evaluation of patient's condition.  ____________________________________________   INITIAL IMPRESSION / ASSESSMENT AND PLAN / ED COURSE  61 y.o. female with a history of iron deficiency anemia, rectal bleeding from internal hemorrhoids, IBS, hypertension who presents for evaluation of shortness of breath and dizziness in the setting of 3 weeks of rectal bleeding. Patient looks pale and has black stool guaiac positive. HD stable. She is suppose to be on iron but has not been taking it. Not on blood thinners. Hgb 5.4. Will start patient on protonix and blood transfusion. Plan for admission. Will consult GI.      As part of my medical decision making, I reviewed the following data within the [facility] notes reviewed and incorporated, Labs reviewed , EKG interpreted , Old EKG reviewed, Old chart reviewed, Discussed with admitting physician , Notes from prior ED visits and  Controlled Substance Database    Pertinent labs & imaging results that were available during my care of the patient were reviewed by  me and considered in my medical decision making (see chart for details).    ____________________________________________   FINAL CLINICAL IMPRESSION(S) / ED DIAGNOSES  Final diagnoses:  Gastrointestinal hemorrhage, unspecified gastrointestinal hemorrhage type  Severe anemia      NEW MEDICATIONS STARTED DURING THIS VISIT:  ED Discharge Orders    None       Note:  This document was prepared using Dragon voice recognition software and may include unintentional dictation errors.    Rudene Re, MD 04/25/17 [phone]

## 2017-04-26 ENCOUNTER — Other Ambulatory Visit: Payer: Self-pay

## 2017-04-26 ENCOUNTER — Other Ambulatory Visit: Payer: Medicare Other

## 2017-04-26 DIAGNOSIS — K922 Gastrointestinal hemorrhage, unspecified: Principal | ICD-10-CM

## 2017-04-26 LAB — COMPREHENSIVE METABOLIC PANEL
ALBUMIN: 3.6 g/dL (ref 3.5–5.0)
ALT: 11 U/L — ABNORMAL LOW (ref 14–54)
ANION GAP: 8 (ref 5–15)
AST: 20 U/L (ref 15–41)
Alkaline Phosphatase: 58 U/L (ref 38–126)
BILIRUBIN TOTAL: 1.2 mg/dL (ref 0.3–1.2)
BUN: 9 mg/dL (ref 6–20)
CHLORIDE: 95 mmol/L — AB (ref 101–111)
CO2: 25 mmol/L (ref 22–32)
Calcium: 8.5 mg/dL — ABNORMAL LOW (ref 8.9–10.3)
Creatinine, Ser: 0.78 mg/dL (ref 0.44–1.00)
GFR calc Af Amer: >60 mL/min (ref >60)
GFR calc non Af Amer: >60 mL/min (ref >60)
GLUCOSE: 105 mg/dL — AB (ref 65–99)
POTASSIUM: 3.8 mmol/L (ref 3.5–5.1)
SODIUM: 128 mmol/L — AB (ref 135–145)
TOTAL PROTEIN: 6.2 g/dL — AB (ref 6.5–8.1)

## 2017-04-26 LAB — CBC
HEMATOCRIT: 26.1 % — AB (ref 35.0–47.0)
HEMOGLOBIN: 8.5 g/dL — AB (ref 12.0–16.0)
MCH: 22.9 pg — AB (ref 26.0–34.0)
MCHC: 32.4 g/dL (ref 32.0–36.0)
MCV: 70.7 fL — AB (ref 80.0–100.0)
Platelets: 179 10*3/uL (ref 150–440)
RBC: 3.69 MIL/uL — ABNORMAL LOW (ref 3.80–5.20)
RDW: 22.9 % — AB (ref 11.5–14.5)
WBC: 5.4 10*3/uL (ref 3.6–11.0)

## 2017-04-26 LAB — VITAMIN B12: Vitamin B-12: 544 pg/mL (ref 180–914)

## 2017-04-26 LAB — GLUCOSE, CAPILLARY
GLUCOSE-CAPILLARY: 91 mg/dL (ref 65–99)
Glucose-Capillary: 130 mg/dL — ABNORMAL HIGH (ref 65–99)
Glucose-Capillary: 116 mg/dL — ABNORMAL HIGH (ref 65–99)
Glucose-Capillary: 114 mg/dL — ABNORMAL HIGH (ref 65–99)

## 2017-04-26 LAB — HEMOGLOBIN: Hemoglobin: 9.3 g/dL — ABNORMAL LOW (ref 12.0–16.0)

## 2017-04-26 LAB — FERRITIN: FERRITIN: 5 ng/mL — AB (ref 11–307)

## 2017-04-26 MED ORDER — SODIUM CHLORIDE 0.9 % IV SOLN
400.0000 mg | Freq: Once | INTRAVENOUS | Status: AC
  Administered 2017-04-26: 400 mg via INTRAVENOUS
  Filled 2017-04-26: qty 20

## 2017-04-26 MED ORDER — PANTOPRAZOLE SODIUM 40 MG PO TBEC
40.0000 mg | DELAYED_RELEASE_TABLET | Freq: Every day | ORAL | Status: DC
  Administered 2017-04-27: 08:00:00 40 mg via ORAL
  Filled 2017-04-26 (×2): qty 1

## 2017-04-26 NOTE — Progress Notes (Signed)
Initial Nutrition Assessment  DOCUMENTATION CODES:   Obesity unspecified  INTERVENTION:  1. Glucerna Shake po TID, each supplement provides 220 kcal and 10 grams of protein w/ diet advancement  NUTRITION DIAGNOSIS:   Inadequate oral intake related to poor appetite, nausea as evidenced by per patient/family report.  GOAL:   Patient will meet greater than or equal to 90% of their needs  MONITOR:   PO intake, I & O's, Labs, Weight trends, Skin  REASON FOR ASSESSMENT:   Malnutrition Screening Tool    ASSESSMENT:   Christina French is a 61 yo female with PMH of HTN, DM, PTSD, LGI bleed and hemorrhoids presents with progressive weakness, SOB, dizziness and BRBPR   Spoke with patient at bedside. She reports that she started a new anti-depressant called trintellix, that led to her eating very little over the past 3 weeks with 10 pound weight loss. This is insignificant for timeframe. She was eating maybe 1 small meal a day due to nausea. Appetite is still poor. Didn't have much of her liquids this morning. NFPE WNL. Likely not malnourished at this time.  Labs reviewed:  Na 128 Medications reviewed and include:  Colace, Iron, Insulin, Remeron  Diet Order:  Diet clear liquid Room service appropriate? Yes; Fluid consistency: Thin  EDUCATION NEEDS:   No education needs have been identified at this time  Skin:  Skin Assessment: Reviewed RN Assessment  Last BM:  12/29  Height:   Ht Readings from Last 1 Encounters:  04/25/17 5\' 2"  (1.575 m)    Weight:   Wt Readings from Last 1 Encounters:  04/26/17 195 lb 11.2 oz (88.8 kg)    Ideal Body Weight:  50 kg  BMI:  Body mass index is 35.79 kg/m.  Estimated Nutritional Needs:   Kcal:  1700-2000 calories  Protein:  88-106 grams  Fluid:  1.7-2L  Satira Anis. Christina Disney, MS, RD LDN Inpatient Clinical Dietitian Pager [phone]

## 2017-04-26 NOTE — Progress Notes (Signed)
Patient ID: Christina French, female   DOB: 1955/12/23, 61 y.o.   MRN: [id number]  [facility] PROGRESS NOTE  Christina French:[id number] DOB: 1955/08/20 DOA: 04/25/2017 PCP: Lucilla Lame, MD  HPI/Subjective: Patient feels better than when she came in.  She was short of breath.  She has seen bright red blood per rectum and dark clots coming out from the rectum.  Her last colonoscopy was in April that showed large hemorrhoids but otherwise unremarkable.  Patient stated she also had hemorrhoid surgery but still with bleeding after that.  Objective: Vitals:   04/26/17 0838 04/26/17 1316  BP: (!) 120/58 121/63  Pulse: 76 75  Resp: 16 20  Temp: 98.4 F (36.9 C) 98 F (36.7 C)  SpO2: 98% 100%    Filed Weights   04/25/17 1606 04/25/17 1819 04/26/17 0518  Weight: 86.2 kg (190 lb) 90.9 kg (200 lb 6.4 oz) 88.8 kg (195 lb 11.2 oz)    ROS: Review of Systems  Constitutional: Negative for chills and fever.  Eyes: Negative for blurred vision.  Respiratory: Negative for cough and shortness of breath.   Cardiovascular: Negative for chest pain.  Gastrointestinal: Positive for blood in stool. Negative for abdominal pain, constipation, diarrhea, nausea and vomiting.  Genitourinary: Negative for dysuria.  Musculoskeletal: Negative for joint pain.  Neurological: Negative for dizziness and headaches.   Exam: Physical Exam  Constitutional: She is oriented to person, place, and time.  HENT:  Nose: No mucosal edema.  Mouth/Throat: No oropharyngeal exudate or posterior oropharyngeal edema.  Eyes: Conjunctivae, EOM and lids are normal. Pupils are equal, round, and reactive to light.  Conjunctiva pale  Neck: No JVD present. Carotid bruit is not present. No edema present. No thyroid mass and no thyromegaly present.  Cardiovascular: S1 normal and S2 normal. Exam reveals no gallop.  No murmur heard. Pulses:      Dorsalis pedis pulses are 2+ on the right side, and 2+ on the left side.  Respiratory:  No respiratory distress. She has decreased breath sounds in the right lower field and the left lower field. She has no wheezes. She has no rhonchi. She has no rales.  GI: Soft. Bowel sounds are normal. There is no tenderness.  Musculoskeletal:       Right ankle: She exhibits no swelling.       Left ankle: She exhibits no swelling.  Lymphadenopathy:    She has no cervical adenopathy.  Neurological: She is alert and oriented to person, place, and time. No cranial nerve deficit.  Skin: Skin is warm. No rash noted. Nails show no clubbing.  Psychiatric: She has a normal mood and affect.      Data Reviewed: Basic Metabolic Panel: Recent Labs  Lab 04/25/17 1603 04/26/17 0336  NA 123* 128*  K 4.1 3.8  CL 90* 95*  CO2 23 25  GLUCOSE 160* 105*  BUN 12 9  CREATININE 0.88 0.78  CALCIUM 8.7* 8.5*   Liver Function Tests: Recent Labs  Lab 04/26/17 0336  AST 20  ALT 11*  ALKPHOS 58  BILITOT 1.2  PROT 6.2*  ALBUMIN 3.6   CBC: Recent Labs  Lab 04/25/17 1603 04/26/17 0336  WBC 4.7 5.4  HGB 5.4* 8.5*  HCT 17.6* 26.1*  MCV 64.4* 70.7*  PLT 220 179   Cardiac Enzymes: Recent Labs  Lab 04/25/17 1603  TROPONINI <0.03    CBG: Recent Labs  Lab 04/25/17 1829 04/25/17 2113 04/26/17 0746 04/26/17 1144  GLUCAP 116* 111* 130* 116*  Studies: Dg Chest 2 View  Result Date: 04/25/2017 CLINICAL DATA:  New onset rectal bleeding, shortness of breath EXAM: CHEST  2 VIEW COMPARISON:  September 30, 2016 FINDINGS: The heart size and mediastinal contours are stable. There is no focal infiltrate, pulmonary edema, or pleural effusion. The visualized skeletal structures are unremarkable. IMPRESSION: No active cardiopulmonary disease. Electronically Signed   By: Abelardo Diesel M.D.   On: 04/25/2017 16:47    Scheduled Meds: . atenolol  50 mg Oral BID  . carbamazepine  300 mg Oral BID  . docusate sodium  100 mg Oral BID  . escitalopram  20 mg Oral Daily  . ferrous sulfate  325 mg Oral  TID WC  . insulin aspart  0-9 Units Subcutaneous TID WC  . levETIRAcetam  500 mg Oral BID  . mirtazapine  30 mg Oral QHS  . pantoprazole  40 mg Oral Daily  . traZODone  50 mg Oral QHS   Continuous Infusions: . iron sucrose 400 mg (04/26/17 1523)    Assessment/Plan:  1. Acute blood loss anemia and lower GI bleed.  Patient not actively hemorrhaging.  GI ordered a bleeding scan but since the patient not actively hemorrhaging it was not done.  Check another hemoglobin now and again in the morning.  Since ferritin is very low I will give IV iron also.  Patient's hemoglobin improved after 2 units of packed red blood cells from 5.4 to 8.5. 2. History of seizures on Keppra 3. Posttraumatic stress syndrome and depression on Lexapro and Remeron 4. GERD on Protonix  5. history of diabetes. On sliding scale insulin 6. Hyponatremia.  Improved with IV fluids but will continue to monitor off IV fluids.  Code Status:     Code Status Orders  (From admission, onward)        Start     Ordered   04/25/17 1822  Full code  Continuous     04/25/17 1821    Code Status History    Date Active Date Inactive Code Status Order ID Comments User Context   08/03/2016 20:00 08/05/2016 21:41 Full Code [id number]  Henreitta Leber, MD Inpatient   11/18/2015 22:19 11/22/2015 12:31 Full Code [id number]  Neldon Newport Inpatient   03/19/2015 21:38 03/23/2015 19:18 Full Code [id number]  Meredith Pel, MD Inpatient    Advance Directive Documentation     Most Recent Value  Type of Advance Directive  Healthcare Power of Attorney, Living will  Pre-existing out of facility DNR order (yellow form or pink MOST form)  No data  "MOST" Form in Place?  No data      Disposition Plan: Hemoglobin will need to be stable  Consultants:  Gastroenterology  Time spent: 28 minutes  [place]

## 2017-04-26 NOTE — Consult Note (Signed)
Lucilla Lame, MD [facility]  [address] [place], [place] [address] Phone: [phone] Fax : [phone]  Consultation  Referring Provider:     Dr. Doy Hutching  Primary Care Physician:  Lucilla Lame, MD Primary Gastroenterologist:  Dr. Allen Norris         Reason for Consultation:     GI bleeding  Date of Admission:  04/25/2017 Date of Consultation:  04/26/2017         HPI:   Christina French is a 61 y.o. female who has a history of rectal bleeding.  The patient now came in with bright red blood per rectum.  The patient has been previously diagnosed with a lower GI bleed with a upper endoscopy and colonoscopy done by me for anemia in the past and recently a repeat colonoscopy by Dr. Vicente Males that showed internal hemorrhoids.  The patient now reports that she has had bright red blood per rectum and some dark stools.  The patient states her dark stools are since she started taking iron. The patient's hemoglobin on admission was 5.4.  She denies any nausea vomiting fevers or chills. The patient has had some nausea with a 10 pound weight loss recently that she states was attributed to a medication she was taking. She reports that she was told that she should undergo a capsule endoscopy but states that since she knows it is her rectal leading from hemorrhoids she has decided not to go through with a capsule endoscopy.  Her last colonoscopy by Dr. Vicente Males reported no blood in the colon or the terminal ileum. After transfusion her hemoglobin is 8.5 today. The patient also reports that she has had rectal bleeding for the last 2 weeks but has not sought any medical help.  Past Medical History:  Diagnosis Date  . Anemia   . Anxiety   . Arthritis   . Barrett esophagus   . Cancer ([place]) 2016    Skin cancer right leg mylenoma  . Depression   . Diabetes ([place]) 04/25/2017  . GERD (gastroesophageal reflux disease)   . History of blood transfusion    with a surgery  . History of bronchitis   . History of pneumonia     . Hypertension   . IBS (irritable bowel syndrome)   . Pneumonia 09/27/15  . PTSD (post-traumatic stress disorder)   . Renal insufficiency    reports acute kidney injury  . Seizures ([place])    rare - last one 07/30/14 - was very anemic.  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 Vertigo    thinks related to sinus issues    Past Surgical History:  Procedure Laterality Date  . ABDOMINAL HYSTERECTOMY    . CESAREAN SECTION    . COLONOSCOPY N/A 08/31/2014   Procedure: COLONOSCOPY;  Surgeon: Lucilla Lame, MD;  Location: [place];  Service: Gastroenterology;  Laterality: N/A;  . COLONOSCOPY N/A 08/05/2016   Procedure: COLONOSCOPY;  Surgeon: [name], MD;  Location: [facility];  Service: Endoscopy;  Laterality: N/A;  . CYST EXCISION  2011   from throat  . ESOPHAGOGASTRODUODENOSCOPY N/A 08/31/2014   Procedure: ESOPHAGOGASTRODUODENOSCOPY (EGD);  Surgeon: Lucilla Lame, MD;  Location: [place];  Service: Gastroenterology;  Laterality: N/A;  . HEMORRHOID SURGERY    . INCISION AND DRAINAGE HIP Right 11/25/2014   Procedure: IRRIGATION  AND DRAINAGEOF RIGHT HIP WITH PLACEMENT OF ANTIBIOUTIC BEADS;  Surgeon: Mcarthur Rossetti, MD;  Location: [place];  Service: Orthopedics;  Laterality: Right;  .  INCISION AND DRAINAGE HIP Right 11/28/2014   Procedure: Repeat I&D Right Hip;  Surgeon: Mcarthur Rossetti, MD;  Location: [place];  Service: Orthopedics;  Laterality: Right;  . JOINT REPLACEMENT Right 2007   hip  . TOTAL HIP REVISION Right 03/19/2015   Procedure: RIGHT TOTAL HIP ARTHROPLASTY REVISION;  Surgeon: Meredith Pel, MD;  Location: [place];  Service: Orthopedics;  Laterality: Right;  . TOTAL HIP REVISION Right 11/18/2015   Procedure: TOTAL HIP REVISION;  Surgeon: Frederik Pear, MD;  Location: [place];  Service: Orthopedics;  Laterality: Right;    Prior to Admission medications   Medication Sig Start Date End Date Taking? Authorizing Provider  acetaminophen (TYLENOL) 500 MG tablet Take  1,000 mg by mouth 2 (two) times daily as needed for mild pain or headache.   Yes [provider]  ALPRAZolam Duanne Moron) 1 MG tablet Take 1 mg by mouth 3 (three) times daily as needed for anxiety.    Yes [provider]  atenolol (TENORMIN) 50 MG tablet Take 1 tablet (50 mg total) by mouth 2 (two) times daily. 04/16/17  Yes Boscia, Greer Ee, NP  carbamazepine (TEGRETOL) 200 MG tablet Take 300 mg by mouth 2 (two) times daily.    Yes [provider]  cyclobenzaprine (FLEXERIL) 10 MG tablet Take 1 tablet (10 mg total) by mouth 2 (two) times daily as needed for muscle spasms. 04/13/17  Yes Ronnell Freshwater, NP  dexlansoprazole (DEXILANT) 60 MG capsule Take 1 capsule (60 mg total) daily by mouth. 03/11/17 05/11/17 Yes [name], MD  escitalopram (LEXAPRO) 20 MG tablet Take 20 mg by mouth daily.    Yes [provider]  hyoscyamine (LEVSIN, ANASPAZ) 0.125 MG tablet Take 1 tablet (0.125 mg total) by mouth 4 (four) times daily as needed for cramping. 09/24/16  Yes [name], MD  levETIRAcetam (KEPPRA) 500 MG tablet Take 500 mg by mouth 2 (two) times daily. 05/08/16 05/08/17 Yes [provider]  mirtazapine (REMERON) 30 MG tablet Take 30 mg by mouth at bedtime.    Yes [provider]  ondansetron (ZOFRAN) 8 MG tablet Take 8 mg by mouth as needed for nausea or vomiting.   Yes [provider]  traZODone (DESYREL) 50 MG tablet Take 50 mg by mouth at bedtime.   Yes [provider]  aspirin EC 325 MG tablet Take 1 tablet (325 mg total) by mouth 2 (two) times daily. Patient not taking: Reported on 08/03/2016 11/20/15   Leighton Parody, PA-C  ferrous sulfate 325 (65 FE) MG tablet Take 1 tablet (325 mg total) by mouth 2 (two) times daily with a meal. Patient not taking: Reported on 04/25/2017 08/05/16   Hillary Bow, MD  oxyCODONE-acetaminophen (ROXICET) 5-325 MG tablet Take 1 tablet by mouth every 4 (four) hours as needed. Patient not taking:  Reported on 08/03/2016 11/20/15   Leighton Parody, PA-C    Family History  Problem Relation Age of Onset  . Alcoholism Father   . Throat cancer Mother      Social History   Tobacco Use  . Smoking status: Never Smoker  . Smokeless tobacco: Never Used  Substance Use Topics  . Alcohol use: No    Alcohol/week: 0.0 oz  . Drug use: No    Allergies as of 04/25/2017 - Review Complete 04/25/2017  Allergen Reaction Noted  . No known allergies  11/17/2015    Review of Systems:    All systems reviewed and negative except where noted in  HPI.   Physical Exam:  Vital signs in last 24 hours: Temp:  [97.9 F (36.6 C)-98.6 F (37 C)] 98 F (36.7 C) (12/31 1316) Pulse Rate:  [74-91] 75 (12/31 1316) Resp:  [16-20] 20 (12/31 1316) BP: (98-182)/(46-95) 121/63 (12/31 1316) SpO2:  [95 %-100 %] 100 % (12/31 1316) Weight:  [190 lb (86.2 kg)-200 lb 6.4 oz (90.9 kg)] 195 lb 11.2 oz (88.8 kg) (12/31 0518) Last BM Date: 04/24/17 General:   Pleasant, cooperative in NAD Head:  Normocephalic and atraumatic. Eyes:   No icterus.   Conjunctiva pink. PERRLA. Ears:  Normal auditory acuity. Neck:  Supple; no masses or thyroidomegaly Lungs: Respirations even and unlabored. Lungs clear to auscultation bilaterally.   No wheezes, crackles, or rhonchi.  Heart:  Regular rate and rhythm;  Without murmur, clicks, rubs or gallops Abdomen:  Soft, nondistended, nontender. Normal bowel sounds. No appreciable masses or hepatomegaly.  No rebound or guarding.  Rectal:  Not performed. Msk:  Symmetrical without gross deformities.   Extremities:  Without edema, cyanosis or clubbing. Neurologic:  Alert and oriented x3;  grossly normal neurologically. Skin:  Intact without significant lesions or rashes. Cervical Nodes:  No significant cervical adenopathy. Psych:  Alert and cooperative. Normal affect.  LAB RESULTS: Recent Labs    04/25/17 1603 04/26/17 0336  WBC 4.7 5.4  HGB 5.4* 8.5*  HCT 17.6* 26.1*  PLT 220 179    BMET Recent Labs    04/25/17 1603 04/26/17 0336  NA 123* 128*  K 4.1 3.8  CL 90* 95*  CO2 23 25  GLUCOSE 160* 105*  BUN 12 9  CREATININE 0.88 0.78  CALCIUM 8.7* 8.5*   LFT Recent Labs    04/26/17 0336  PROT 6.2*  ALBUMIN 3.6  AST 20  ALT 11*  ALKPHOS 58  BILITOT 1.2   PT/INR Recent Labs    04/25/17 1637  LABPROT 14.1  INR 1.10    STUDIES: Dg Chest 2 View  Result Date: 04/25/2017 CLINICAL DATA:  New onset rectal bleeding, shortness of breath EXAM: CHEST  2 VIEW COMPARISON:  September 30, 2016 FINDINGS: The heart size and mediastinal contours are stable. There is no focal infiltrate, pulmonary edema, or pleural effusion. The visualized skeletal structures are unremarkable. IMPRESSION: No active cardiopulmonary disease. Electronically Signed   By: Abelardo Diesel M.D.   On: 04/25/2017 16:47      Impression / Plan:   IESHIA HATCHER is a 61 y.o. y/o female with a history of GI bleeding.  The patient has undergone a colonoscopy by Dr. Vicente Males and an EGD and colonoscopy by myself in the past. The patient now comes in with rectal bleeding and dark stools that she reports to be from the iron she has been taking.  Although the patient can be bleeding from a hemorrhoidal source other possibility should be investigated not have ordered a red tag cell bleeding scan.  The patient does report that she has been on Advil recently also.  If the bleeding scan is negative a delayed bleeding scan may be an option over the next 24-48 hours. If the bleeding scan is positive the patient may need surgical intervention for her hemorrhoidal bleeding. The patient has been explained the plan and agrees with it   Thank you for involving me in the care of this patient.      LOS: 1 day   Lucilla Lame, MD  04/26/2017, 1:21 PM   Note: This dictation was prepared with Dragon dictation along with  smaller phrase technology. Any transcriptional errors that result from this process are unintentional.

## 2017-04-27 DIAGNOSIS — D649 Anemia, unspecified: Secondary | ICD-10-CM

## 2017-04-27 LAB — BASIC METABOLIC PANEL
Anion gap: 9 (ref 5–15)
BUN: 6 mg/dL (ref 6–20)
CHLORIDE: 95 mmol/L — AB (ref 101–111)
CO2: 25 mmol/L (ref 22–32)
CREATININE: 0.71 mg/dL (ref 0.44–1.00)
Calcium: 8.9 mg/dL (ref 8.9–10.3)
GFR calc Af Amer: >60 mL/min (ref >60)
GFR calc non Af Amer: >60 mL/min (ref >60)
Glucose, Bld: 117 mg/dL — ABNORMAL HIGH (ref 65–99)
Potassium: 3.6 mmol/L (ref 3.5–5.1)
SODIUM: 129 mmol/L — AB (ref 135–145)

## 2017-04-27 LAB — CBC
HEMATOCRIT: 28.7 % — AB (ref 35.0–47.0)
HEMOGLOBIN: 9.4 g/dL — AB (ref 12.0–16.0)
MCH: 23.5 pg — AB (ref 26.0–34.0)
MCHC: 32.8 g/dL (ref 32.0–36.0)
MCV: 71.8 fL — AB (ref 80.0–100.0)
Platelets: 185 10*3/uL (ref 150–440)
RBC: 4 MIL/uL (ref 3.80–5.20)
RDW: 22.9 % — ABNORMAL HIGH (ref 11.5–14.5)
WBC: 5.6 10*3/uL (ref 3.6–11.0)

## 2017-04-27 LAB — GLUCOSE, CAPILLARY
GLUCOSE-CAPILLARY: 145 mg/dL — AB (ref 65–99)
GLUCOSE-CAPILLARY: 119 mg/dL — AB (ref 65–99)

## 2017-04-27 LAB — PREPARE RBC (CROSSMATCH)

## 2017-04-27 MED ORDER — FERROUS SULFATE 325 (65 FE) MG PO TABS: 325.0000 mg | ORAL_TABLET | Freq: Two times a day (BID) | ORAL | 0 refills | Status: DC

## 2017-04-27 MED ORDER — HYDROCORTISONE ACETATE 25 MG RE SUPP: 25.0000 mg | Freq: Two times a day (BID) | RECTAL | 0 refills | Status: AC

## 2017-04-27 NOTE — Progress Notes (Signed)
  Christina Lame, MD [facility]   [address] [place], [place] [address] Phone: [phone] Fax : [phone]   Subjective: The patient had her bleeding scan canceled yesterday due to no further bleeding. The patient's hemoglobin has been stable. The patient denies any further bleeding since being admitted.   Objective: Vital signs in last 24 hours: Vitals:   04/26/17 2009 04/27/17 0558 04/27/17 0559 04/27/17 0830  BP: (!) 105/54  (!) 131/59 (!) 123/59  Pulse: 66  81 68  Resp: 20   20  Temp: 97.8 F (36.6 C)  97.8 F (36.6 C)   TempSrc: Oral  Oral   SpO2: 100%  99% 98%  Weight:  191 lb 12.8 oz (87 kg)    Height:       Weight change: 1 lb 12.8 oz (0.816 kg)  Intake/Output Summary (Last 24 hours) at 04/27/2017 1016 Last data filed at 04/27/2017 0900 Gross per 24 hour  Intake 1510.83 ml  Output -  Net 1510.83 ml     Exam: Heart:: Regular rate and rhythm, S1S2 present or without murmur or extra heart sounds Lungs: normal and clear to auscultation and percussion Abdomen: soft, nontender, normal bowel sounds   Lab Results: @LABTEST2 @ Micro Results: No results found for this or any previous visit (from the past 240 hour(s)). Studies/Results: Dg Chest 2 View  Result Date: 04/25/2017 CLINICAL DATA:  New onset rectal bleeding, shortness of breath EXAM: CHEST  2 VIEW COMPARISON:  September 30, 2016 FINDINGS: The heart size and mediastinal contours are stable. There is no focal infiltrate, pulmonary edema, or pleural effusion. The visualized skeletal structures are unremarkable. IMPRESSION: No active cardiopulmonary disease. Electronically Signed   By: Abelardo Diesel M.D.   On: 04/25/2017 16:47   Medications: I have reviewed the patient's current medications. Scheduled Meds: . atenolol  50 mg Oral BID  . carbamazepine  300 mg Oral BID  . docusate sodium  100 mg Oral BID  . escitalopram  20 mg Oral Daily  . ferrous sulfate  325 mg Oral TID WC  . insulin aspart  0-9 Units  Subcutaneous TID WC  . levETIRAcetam  500 mg Oral BID  . mirtazapine  30 mg Oral QHS  . pantoprazole  40 mg Oral Daily  . traZODone  50 mg Oral QHS   Continuous Infusions: PRN Meds:.acetaminophen **OR** acetaminophen, ALPRAZolam, bisacodyl, cyclobenzaprine, guaiFENesin-dextromethorphan, hyoscyamine, ondansetron **OR** ondansetron (ZOFRAN) IV   Assessment: Principal Problem:   GI bleed Active Problems:   Symptomatic anemia   Hyponatremia   Diabetes ([place])    Plan: This patient came in with rectal bleeding for 2 weeks that has now stopped. The patient should go back on her iron infusion. The patient has had multiple colonoscopies and workup for her bleeding without any source found. The patient has been told to come back to the emergency room if she has any further bleeding. She has also been told that the next part of her workup should include a leading scan to assess whether the blood is pulling in the rectum thereby confirming this being hemorrhoidal bleeding post 2 another source of bleeding. The patient can be discharged from a GI point of view.   LOS: 2 days   Christina French 04/27/2017, 10:16 AM

## 2017-04-27 NOTE — Discharge Summary (Signed)
[facility] NAME: [name]    MR#:  [id number]  DATE OF BIRTH:  1955/07/07  DATE OF ADMISSION:  04/25/2017 ADMITTING PHYSICIAN: Idelle Crouch, MD  DATE OF DISCHARGE: 04/27/2017  1:15 PM  PRIMARY CARE PHYSICIAN: Lucilla Lame, MD    ADMISSION DIAGNOSIS:  Severe anemia [D64.9] Gastrointestinal hemorrhage, unspecified gastrointestinal hemorrhage type [K92.2]  DISCHARGE DIAGNOSIS:  Principal Problem:   GI bleed Active Problems:   Symptomatic anemia   Hyponatremia   Diabetes (HCC)   Severe anemia   SECONDARY DIAGNOSIS:   Past Medical History:  Diagnosis Date  . Anemia   . Anxiety   . Arthritis   . Barrett esophagus   . Cancer ([place]) 2016    Skin cancer right leg mylenoma  . Depression   . Diabetes ([place]) 04/25/2017  . GERD (gastroesophageal reflux disease)   . History of blood transfusion    with a surgery  . History of bronchitis   . History of pneumonia   . Hypertension   . IBS (irritable bowel syndrome)   . Pneumonia 09/27/15  . PTSD (post-traumatic stress disorder)   . Renal insufficiency    reports acute kidney injury  . Seizures ([place])    rare - last one 07/30/14 - was very anemic.  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 Vertigo    thinks related to sinus issues    HOSPITAL COURSE:   1.  Acute blood loss anemia and lower GI bleed.  Patient has a severe iron deficiency anemia.  Patient had a colonoscopy done on 08/05/2016 showed normal colon and hemorrhoids.  Patient was seen by gastroenterology and no further recommendations or procedures needed at this time.  The patient was not hemorrhaging enough for a bleeding scan to be done.  The patient responded to 2 units of packed red blood cells.  The patient's initial hemoglobin was 5.4.  She responded up to 8.5.  On the morning of discharge her hemoglobin was 9.4.  I also gave her 400 mg of IV Venofer.  Refer to hematology as outpatient for iron infusions and closer  monitoring of blood counts.  Anusol suppositories prescribed for hemorrhoids.  Ferrous sulfate prescribed for anemia. 2.  History of seizures on Keppra 3.  Posttraumatic stress syndrome and depression on Lexapro and Remeron 4.  GERD on Protonix 5.  History of diabetes on diet control 6.  Chronic hyponatremia.  No restrictions on diet.   DISCHARGE CONDITIONS:   Satisfactory  CONSULTS OBTAINED:  Gastroenterology  DRUG ALLERGIES:   Allergies  Allergen Reactions  . No Known Allergies     DISCHARGE MEDICATIONS:   Allergies as of 04/27/2017      Reactions   No Known Allergies       Medication List    STOP taking these medications   aspirin EC 325 MG tablet   oxyCODONE-acetaminophen 5-325 MG tablet Commonly known as:  ROXICET     TAKE these medications   acetaminophen 500 MG tablet Commonly known as:  TYLENOL Take 1,000 mg by mouth 2 (two) times daily as needed for mild pain or headache.   ALPRAZolam 1 MG tablet Commonly known as:  XANAX Take 1 mg by mouth 3 (three) times daily as needed for anxiety.   atenolol 50 MG tablet Commonly known as:  TENORMIN Take 1 tablet (50 mg total) by mouth 2 (two) times daily.   carbamazepine 200 MG tablet Commonly known as:  TEGRETOL Take  300 mg by mouth 2 (two) times daily.   cyclobenzaprine 10 MG tablet Commonly known as:  FLEXERIL Take 1 tablet (10 mg total) by mouth 2 (two) times daily as needed for muscle spasms.   dexlansoprazole 60 MG capsule Commonly known as:  DEXILANT Take 1 capsule (60 mg total) daily by mouth.   escitalopram 20 MG tablet Commonly known as:  LEXAPRO Take 20 mg by mouth daily.   ferrous sulfate 325 (65 FE) MG tablet Take 1 tablet (325 mg total) by mouth 2 (two) times daily with a meal.   hydrocortisone 25 MG suppository Commonly known as:  ANUSOL-HC Place 1 suppository (25 mg total) rectally every 12 (twelve) hours.   hyoscyamine 0.125 MG tablet Commonly known as:  LEVSIN, ANASPAZ Take 1  tablet (0.125 mg total) by mouth 4 (four) times daily as needed for cramping.   levETIRAcetam 500 MG tablet Commonly known as:  KEPPRA Take 500 mg by mouth 2 (two) times daily.   mirtazapine 30 MG tablet Commonly known as:  REMERON Take 30 mg by mouth at bedtime.   ondansetron 8 MG tablet Commonly known as:  ZOFRAN Take 8 mg by mouth as needed for nausea or vomiting.   traZODone 50 MG tablet Commonly known as:  DESYREL Take 50 mg by mouth at bedtime.        DISCHARGE INSTRUCTIONS:   Follow-up PMD 1 week Follow-up hematology as outpatient in a few weeks  If you experience worsening of your admission symptoms, develop shortness of breath, life threatening emergency, suicidal or homicidal thoughts you must seek medical attention immediately by calling 911 or calling your MD immediately  if symptoms less severe.  You Must read complete instructions/literature along with all the possible adverse reactions/side effects for all the Medicines you take and that have been prescribed to you. Take any new Medicines after you have completely understood and accept all the possible adverse reactions/side effects.   Please note  You were cared for by a hospitalist during your hospital stay. If you have any questions about your discharge medications or the care you received while you were in the hospital after you are discharged, you can call the unit and asked to speak with the hospitalist on call if the hospitalist that took care of you is not available. Once you are discharged, your primary care physician will handle any further medical issues. Please note that NO REFILLS for any discharge medications will be authorized once you are discharged, as it is imperative that you return to your primary care physician (or establish a relationship with a primary care physician if you do not have one) for your aftercare needs so that they can reassess your need for medications and monitor your lab  values.    Today   CHIEF COMPLAINT:   Chief Complaint  Patient presents with  . Shortness of Breath  . Rectal Bleeding    HISTORY OF PRESENT ILLNESS:  Christina French  is a 62 y.o. female was admitted with shortness of breath and rectal bleeding and found to have a hemoglobin of 5.4   VITAL SIGNS:  Blood pressure (!) 123/59, pulse 68, temperature 97.8 F (36.6 C), temperature source Oral, resp. rate 20, height 5\' 2"  (1.575 m), weight 87 kg (191 lb 12.8 oz), SpO2 98 %.    PHYSICAL EXAMINATION:  GENERAL:  62 y.o.-year-old patient lying in the bed with no acute distress.  EYES: Pupils equal, round, reactive to light and accommodation. No scleral icterus.  Extraocular muscles intact.  HEENT: Head atraumatic, normocephalic. Oropharynx and nasopharynx clear.  NECK:  Supple, no jugular venous distention. No thyroid enlargement, no tenderness.  LUNGS: Normal breath sounds bilaterally, no wheezing, rales,rhonchi or crepitation. No use of accessory muscles of respiration.  CARDIOVASCULAR: S1, S2 normal. No murmurs, rubs, or gallops.  ABDOMEN: Soft, non-tender, non-distended. Bowel sounds present. No organomegaly or mass.  EXTREMITIES: No pedal edema, cyanosis, or clubbing.  NEUROLOGIC: Cranial nerves II through XII are intact. Muscle strength 5/5 in all extremities. Sensation intact. Gait not checked.  PSYCHIATRIC: The patient is alert and oriented x 3.  SKIN: No obvious rash, lesion, or ulcer.   DATA REVIEW:   CBC Recent Labs  Lab 04/27/17 0545  WBC 5.6  HGB 9.4*  HCT 28.7*  PLT 185    Chemistries  Recent Labs  Lab 04/26/17 0336 04/27/17 0545  NA 128* 129*  K 3.8 3.6  CL 95* 95*  CO2 25 25  GLUCOSE 105* 117*  BUN 9 6  CREATININE 0.78 0.71  CALCIUM 8.5* 8.9  AST 20  --   ALT 11*  --   ALKPHOS 58  --   BILITOT 1.2  --     Cardiac Enzymes Recent Labs  Lab 04/25/17 1603  TROPONINI <0.03      RADIOLOGY:  Dg Chest 2 View  Result Date: 04/25/2017 CLINICAL  DATA:  New onset rectal bleeding, shortness of breath EXAM: CHEST  2 VIEW COMPARISON:  September 30, 2016 FINDINGS: The heart size and mediastinal contours are stable. There is no focal infiltrate, pulmonary edema, or pleural effusion. The visualized skeletal structures are unremarkable. IMPRESSION: No active cardiopulmonary disease. Electronically Signed   By: Abelardo Diesel M.D.   On: 04/25/2017 16:47    Management plans discussed with the patient, family and they are in agreement.  CODE STATUS:     Code Status Orders  (From admission, onward)        Start     Ordered   04/25/17 1822  Full code  Continuous     04/25/17 1821    Code Status History    Date Active Date Inactive Code Status Order ID Comments User Context   08/03/2016 20:00 08/05/2016 21:41 Full Code [id number]  Henreitta Leber, MD Inpatient   11/18/2015 22:19 11/22/2015 12:31 Full Code [id number]  Neldon Newport Inpatient   03/19/2015 21:38 03/23/2015 19:18 Full Code [id number]  Meredith Pel, MD Inpatient    Advance Directive Documentation     Most Recent Value  Type of Advance Directive  Healthcare Power of Attorney, Living will  Pre-existing out of facility DNR order (yellow form or pink MOST form)  No data  "MOST" Form in Place?  No data      TOTAL TIME TAKING CARE OF THIS PATIENT: 35 minutes.    Loletha Grayer M.D on 04/27/2017 at 4:00 PM  Between 7am to 6pm - Pager - [phone]  After 6pm go to [email or web address] - password [email or web address]  [facility] Office  [phone]  CC: Primary care physician; Lucilla Lame, MD

## 2017-04-27 NOTE — Progress Notes (Signed)
Discharge instructions reviewed with patient. Patient notified to call for follow up appts as offices are closed in observance of holiday. Prescriptions given to patient and IVs removed from both arms. Patient verbalized understanding of discharge instructions.

## 2017-04-28 LAB — TYPE AND SCREEN
ABO/RH(D): O NEG
ANTIBODY SCREEN: POSITIVE
Donor AG Type: NEGATIVE
Donor AG Type: NEGATIVE
UNIT DIVISION: 0
UNIT DIVISION: 0
UNIT DIVISION: 0
Unit division: 0
Unit division: 0
Unit division: 0

## 2017-04-28 LAB — BPAM RBC
BLOOD PRODUCT EXPIRATION DATE: 201901172359
BLOOD PRODUCT EXPIRATION DATE: 201901172359
BLOOD PRODUCT EXPIRATION DATE: 201901172359
BLOOD PRODUCT EXPIRATION DATE: 201901262359
Blood Product Expiration Date: 201901072359
Blood Product Expiration Date: 201901172359
ISSUE DATE / TIME: 201812302129
ISSUE DATE / TIME: 201812310016
UNIT TYPE AND RH: 9500
UNIT TYPE AND RH: 9500
UNIT TYPE AND RH: 9500
Unit Type and Rh: 9500
Unit Type and Rh: 9500
Unit Type and Rh: 9500

## 2017-05-13 ENCOUNTER — Ambulatory Visit: Payer: Medicare Other | Admitting: Gastroenterology

## 2017-05-24 ENCOUNTER — Other Ambulatory Visit: Payer: Self-pay

## 2017-05-24 ENCOUNTER — Telehealth: Payer: Self-pay | Admitting: Gastroenterology

## 2017-05-24 MED ORDER — DEXLANSOPRAZOLE 60 MG PO CPDR: 60.0000 mg | DELAYED_RELEASE_CAPSULE | Freq: Every day | ORAL | 0 refills | Status: DC

## 2017-05-24 MED ORDER — ONDANSETRON HCL 8 MG PO TABS: 8.0000 mg | ORAL_TABLET | ORAL | 0 refills | Status: DC | PRN

## 2017-05-24 NOTE — Telephone Encounter (Signed)
DAUGHTER CALLED AND L/M ON ANSWERING MACHINE STATING SHE WAS RECENTLY DISCHARGED FROM THE HOSPITAL.(DID NOT SEE OUR DOCTORS OR FOR GI PROBLEM) SHE IS CALLING TO WANTING A REFILL ON MEDICATION (DID NOT GIVE NAME) & SCHEDULE A SURGERY(NO INFO GIVEN)PLEASE CALL THE DAUGHTER TO MAKE APPOINTMENT BECAUSE SHE IS PATIENTS TRANSPORTATION.

## 2017-05-24 NOTE — Telephone Encounter (Signed)
Per patient, daughter has been given permission to make appointments and request medications on her behalf.

## 2017-06-01 ENCOUNTER — Ambulatory Visit (INDEPENDENT_AMBULATORY_CARE_PROVIDER_SITE_OTHER): Payer: Medicare Other | Admitting: Nurse Practitioner

## 2017-06-01 ENCOUNTER — Encounter: Payer: Self-pay | Admitting: Nurse Practitioner

## 2017-06-01 VITALS — BP 155/83 | HR 76 | Resp 16 | Ht 62.0 in | Wt 198.0 lb

## 2017-06-01 DIAGNOSIS — I10 Essential (primary) hypertension: Secondary | ICD-10-CM | POA: Diagnosis not present

## 2017-06-01 DIAGNOSIS — M5440 Lumbago with sciatica, unspecified side: Secondary | ICD-10-CM | POA: Diagnosis not present

## 2017-06-01 DIAGNOSIS — G40909 Epilepsy, unspecified, not intractable, without status epilepticus: Secondary | ICD-10-CM

## 2017-06-01 MED ORDER — CYCLOBENZAPRINE HCL 10 MG PO TABS: 10.0000 mg | ORAL_TABLET | Freq: Three times a day (TID) | ORAL | 3 refills | Status: DC | PRN

## 2017-06-01 NOTE — Progress Notes (Addendum)
[facility] [place], [place] [address]  Internal MEDICINE  Office Visit Note   Patient Name: Christina French  [id number]  [id number]  Date of Service: 06/01/2017  No chief complaint on file.   Back Pain  This is a chronic problem. The current episode started more than 1 year ago. The problem occurs daily. The problem is unchanged. The pain is present in the sacro-iliac. The quality of the pain is described as aching. The pain radiates to the left thigh and right thigh. The pain is moderate. The pain is the same all the time. The symptoms are aggravated by bending, sitting, standing, twisting and position. Associated symptoms include leg pain, numbness and tingling. Pertinent negatives include no abdominal pain, chest pain or dysuria. Risk factors: bilateral hip replacement and DDD. She has tried muscle relaxant, NSAIDs and walking for the symptoms. The treatment provided moderate relief.    Pt is here for routine follow up.    Current Medication: Outpatient Encounter Medications as of 06/01/2017  Medication Sig  . acetaminophen (TYLENOL) 500 MG tablet Take 1,000 mg by mouth 2 (two) times daily as needed for mild pain or headache.  . ALPRAZolam (XANAX) 1 MG tablet Take 1 mg by mouth 3 (three) times daily as needed for anxiety.   Marland Kitchen atenolol (TENORMIN) 50 MG tablet Take 1 tablet (50 mg total) by mouth 2 (two) times daily.  . carbamazepine (TEGRETOL) 200 MG tablet Take 300 mg by mouth 2 (two) times daily.   . cyclobenzaprine (FLEXERIL) 10 MG tablet Take 1 tablet (10 mg total) by mouth 2 (two) times daily as needed for muscle spasms.  Marland Kitchen dexlansoprazole (DEXILANT) 60 MG capsule Take 1 capsule (60 mg total) by mouth daily for 17 days.  Marland Kitchen escitalopram (LEXAPRO) 20 MG tablet Take 20 mg by mouth daily.   . ferrous sulfate 325 (65 FE) MG tablet Take 1 tablet (325 mg total) by mouth 2 (two) times daily with a meal.  . hydrocortisone (ANUSOL-HC) 25 MG suppository Place 1  suppository (25 mg total) rectally every 12 (twelve) hours.  . hyoscyamine (LEVSIN, ANASPAZ) 0.125 MG tablet Take 1 tablet (0.125 mg total) by mouth 4 (four) times daily as needed for cramping.  . mirtazapine (REMERON) 30 MG tablet Take 30 mg by mouth at bedtime.   . ondansetron (ZOFRAN) 8 MG tablet Take 1 tablet (8 mg total) by mouth as needed for nausea or vomiting.  . traZODone (DESYREL) 50 MG tablet Take 50 mg by mouth at bedtime.  . levETIRAcetam (KEPPRA) 500 MG tablet Take 500 mg by mouth 2 (two) times daily.   No facility-administered encounter medications on file as of 06/01/2017.     Surgical History: Past Surgical History:  Procedure Laterality Date  . ABDOMINAL HYSTERECTOMY    . CESAREAN SECTION    . COLONOSCOPY N/A 08/31/2014   Procedure: COLONOSCOPY;  Surgeon: Lucilla Lame, MD;  Location: [place];  Service: Gastroenterology;  Laterality: N/A;  . COLONOSCOPY N/A 08/05/2016   Procedure: COLONOSCOPY;  Surgeon: [name], MD;  Location: [facility];  Service: Endoscopy;  Laterality: N/A;  . CYST EXCISION  2011   from throat  . ESOPHAGOGASTRODUODENOSCOPY N/A 08/31/2014   Procedure: ESOPHAGOGASTRODUODENOSCOPY (EGD);  Surgeon: Lucilla Lame, MD;  Location: [facility];  Service: Gastroenterology;  Laterality: N/A;  . HEMORRHOID SURGERY    . INCISION AND DRAINAGE HIP Right 11/25/2014   Procedure: IRRIGATION  AND DRAINAGEOF RIGHT HIP WITH PLACEMENT OF ANTIBIOUTIC BEADS;  Surgeon: Mcarthur Rossetti, MD;  Location: [facility] [facility];  Service: Orthopedics;  Laterality: Right;  . INCISION AND DRAINAGE HIP Right 11/28/2014   Procedure: Repeat I&D Right Hip;  Surgeon: Mcarthur Rossetti, MD;  Location: [facility];  Service: Orthopedics;  Laterality: Right;  . JOINT REPLACEMENT Right 2007   hip  . TOTAL HIP REVISION Right 03/19/2015   Procedure: RIGHT TOTAL HIP ARTHROPLASTY REVISION;  Surgeon: Meredith Pel, MD;  Location: [facility];  Service: Orthopedics;  Laterality: Right;  .  TOTAL HIP REVISION Right 11/18/2015   Procedure: TOTAL HIP REVISION;  Surgeon: Frederik Pear, MD;  Location: [place];  Service: Orthopedics;  Laterality: Right;    Medical History: Past Medical History:  Diagnosis Date  . Anemia   . Anxiety   . Arthritis   . Barrett esophagus   . Cancer ([place]) 2016    Skin cancer right leg mylenoma  . Depression   . Diabetes ([place]) 04/25/2017  . GERD (gastroesophageal reflux disease)   . History of blood transfusion    with a surgery  . History of bronchitis   . History of pneumonia   . Hypertension   . IBS (irritable bowel syndrome)   . Pneumonia 09/27/15  . PTSD (post-traumatic stress disorder)   . Renal insufficiency    reports acute kidney injury  . Seizures ([place])    rare - last one 07/30/14 - was very anemic.  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 Vertigo    thinks related to sinus issues    Family History: Family History  Problem Relation Age of Onset  . Alcoholism Father   . Throat cancer Mother     Social History   Socioeconomic History  . Marital status: Single    Spouse name: Not on file  . Number of children: Not on file  . Years of education: Not on file  . Highest education level: Not on file  Social Needs  . Financial resource strain: Not on file  . Food insecurity - worry: Not on file  . Food insecurity - inability: Not on file  . Transportation needs - medical: Not on file  . Transportation needs - non-medical: Not on file  Occupational History  . Not on file  Tobacco Use  . Smoking status: Never Smoker  . Smokeless tobacco: Never Used  Substance and Sexual Activity  . Alcohol use: No    Alcohol/week: 0.0 oz  . Drug use: No  . Sexual activity: Not on file  Other Topics Concern  . Not on file  Social History Narrative  . Not on file      Review of Systems  Constitutional: Negative for chills, fatigue and unexpected weight change.  HENT: Positive for postnasal drip. Negative for congestion, rhinorrhea, sneezing and  sore throat.   Eyes: Negative for redness.  Respiratory: Negative for cough, chest tightness and shortness of breath.   Cardiovascular: Negative for chest pain and palpitations.       Elevated blood pressures.   Gastrointestinal: Negative for abdominal pain, constipation, diarrhea, nausea and vomiting.  Genitourinary: Negative for dysuria and frequency.  Musculoskeletal: Positive for back pain and myalgias. Negative for arthralgias, joint swelling and neck pain.       Chronic back pain, radiates to both hips and down the upper legs .  Skin: Negative for rash.  Neurological: Positive for tingling and numbness. Negative for tremors.  Hematological: Negative for adenopathy. Does not bruise/bleed easily.  Psychiatric/Behavioral: Negative for behavioral problems (  Depression), sleep disturbance and suicidal ideas. The patient is not nervous/anxious.     Today's Vitals   06/01/17 1522  BP: (!) 155/83  Pulse: 76  Resp: 16  SpO2: 96%  Weight: 198 lb (89.8 kg)  Height: 5\' 2"  (1.575 m)    Physical Exam  Constitutional: She is oriented to person, place, and time. She appears well-developed and well-nourished. No distress.  HENT:  Head: Normocephalic and atraumatic.  Mouth/Throat: Oropharynx is clear and moist. No oropharyngeal exudate.  Eyes: EOM are normal. Pupils are equal, round, and reactive to light.  Neck: Normal range of motion. Neck supple. No JVD present. Carotid bruit is not present. No tracheal deviation present. No thyromegaly present.  Cardiovascular: Normal rate, regular rhythm and normal heart sounds. Exam reveals no gallop and no friction rub.  No murmur heard. Pulmonary/Chest: Effort normal and breath sounds normal. No respiratory distress. She has no wheezes. She has no rales. She exhibits no tenderness.  Abdominal: Soft. Bowel sounds are normal. There is no tenderness.  Musculoskeletal:  Moderate and intermittent low back pain. Worse with bending and twisting at the waist.    Lymphadenopathy:    She has no cervical adenopathy.  Neurological: She is alert and oriented to person, place, and time. No cranial nerve deficit.  Skin: Skin is warm and dry. She is not diaphoretic.  Psychiatric: She has a normal mood and affect. Her behavior is normal. Judgment and thought content normal.  Nursing note and vitals reviewed.   Assessment/Plan:  1. Low back pain of thoracolumbar region with sciatica Overall, doing well.  - cyclobenzaprine (FLEXERIL) 10 MG tablet; Take 1 tablet (10 mg total) by mouth 3 (three) times daily as needed for muscle spasms.  Dispense: 90 tablet; Refill: 3  2. Essential hypertension Stable. Continue bp medication as prescribed.   3. Seizure disorder ([place]) Stable.   General Counseling: shaquayla klimas understanding of the findings of todays visit and agrees with plan of treatment. I have discussed any further diagnostic evaluation that may be needed or ordered today. We also reviewed her medications today. she has been encouraged to call the office with any questions or concerns that should arise related to todays visit.   This patient was seen by Leretha Pol, FNP- C in Collaboration with Dr [name] as a part of collaborative care agreement      Time spent: 54  Minutes     Dr [name] Internal medicine

## 2017-06-08 ENCOUNTER — Ambulatory Visit (INDEPENDENT_AMBULATORY_CARE_PROVIDER_SITE_OTHER): Payer: Medicare Other | Admitting: Gastroenterology

## 2017-06-08 ENCOUNTER — Encounter: Payer: Self-pay | Admitting: Gastroenterology

## 2017-06-08 ENCOUNTER — Other Ambulatory Visit
Admission: RE | Admit: 2017-06-08 | Discharge: 2017-06-08 | Disposition: A | Discharge status: Home / Self Care | Payer: Medicare Other | Source: Ambulatory Visit | Attending: Gastroenterology | Admitting: Gastroenterology

## 2017-06-08 VITALS — BP 142/92 | HR 71 | Temp 97.6°F | Ht 62.0 in | Wt 197.2 lb

## 2017-06-08 DIAGNOSIS — D509 Iron deficiency anemia, unspecified: Secondary | ICD-10-CM

## 2017-06-08 DIAGNOSIS — K625 Hemorrhage of anus and rectum: Secondary | ICD-10-CM

## 2017-06-08 LAB — CBC WITH DIFFERENTIAL/PLATELET
BASOS ABS: 0 10*3/uL (ref 0–0.1)
BASOS PCT: 1 %
EOS ABS: 0.1 10*3/uL (ref 0–0.7)
EOS PCT: 1 %
HCT: 28.6 % — ABNORMAL LOW (ref 35.0–47.0)
Hemoglobin: 9.2 g/dL — ABNORMAL LOW (ref 12.0–16.0)
Lymphocytes Relative: 24 %
Lymphs Abs: 1 10*3/uL (ref 1.0–3.6)
MCH: 26 pg (ref 26.0–34.0)
MCHC: 32.3 g/dL (ref 32.0–36.0)
MCV: 80.7 fL (ref 80.0–100.0)
MONO ABS: 0.5 10*3/uL (ref 0.2–0.9)
Monocytes Relative: 13 %
Neutro Abs: 2.6 10*3/uL (ref 1.4–6.5)
Neutrophils Relative %: 61 %
PLATELETS: 238 10*3/uL (ref 150–440)
RBC: 3.55 MIL/uL — ABNORMAL LOW (ref 3.80–5.20)
RDW: 19.5 % — AB (ref 11.5–14.5)
WBC: 4.2 10*3/uL (ref 3.6–11.0)

## 2017-06-08 LAB — IRON AND TIBC
IRON: 223 ug/dL — AB (ref 28–170)
SATURATION RATIOS: 48 % — AB (ref 10.4–31.8)
TIBC: 463 ug/dL — AB (ref 250–450)
UIBC: 240 ug/dL

## 2017-06-08 LAB — FERRITIN: FERRITIN: 11 ng/mL (ref 11–307)

## 2017-06-08 NOTE — Progress Notes (Signed)
[name] MD, MRCP(U.K) [address]  [place]  [place], [place] [address]  Main: [phone]  Fax: [phone]   Primary Care Physician: Lucilla Lame, MD  Primary Gastroenterologist:  Dr. [name]   Chief Complaint  Patient presents with  . Rectal Bleeding  . Hemorrhoids  . Anorexia    HPI: Christina French is a 62 y.o. female   Summary of history :  She was seen by myself when admitted on 08/03/16 for rectal bleeding with blood mixed with stool for more than a week , no nsaid use , appears she was investigated back in 2016 for iron deficiency anemia and was advised a capsule study which she didn't obtain. She has a history of colon polyps. .On admission she had a HB of 9 grams, MCV 80 . 8 months back Hb 10.3 She ws found to have iron deficiency anemia . I performed a colonoscopy which was normal except for large internal hemorrhoids, normal terminal ileum.   Interval history    09/07/2016 -05/2017   She was admitted in 03/2017 for BRBPR,Hb was 5.4 on admission , had a blood transfusion.  She has had hemorroidal treatment in the past which she says didn't work. Given IV Iron . In 03/2017 Ferritin was 5, normal B12,Hb 9.3 .   Still having rectal bleeding. Not much appetite. Has bright red blood on the stool with a bowel movement . She has a hard bowel movement every few days. Not on any laxatives. On oral iron. Takes no NSAID's, no blood in urine or nasal bleeding or   Current Outpatient Medications  Medication Sig Dispense Refill  . acetaminophen (TYLENOL) 500 MG tablet Take 1,000 mg by mouth 2 (two) times daily as needed for mild pain or headache.    . ALPRAZolam (XANAX) 1 MG tablet Take 1 mg by mouth 3 (three) times daily as needed for anxiety.     Marland Kitchen atenolol (TENORMIN) 50 MG tablet Take 1 tablet (50 mg total) by mouth 2 (two) times daily. 60 tablet 5  . carbamazepine (TEGRETOL) 200 MG tablet Take 300 mg by mouth 2 (two) times daily.     . cyclobenzaprine (FLEXERIL)  10 MG tablet Take 1 tablet (10 mg total) by mouth 3 (three) times daily as needed for muscle spasms. 90 tablet 3  . dexlansoprazole (DEXILANT) 60 MG capsule Take 1 capsule (60 mg total) by mouth daily for 17 days. 17 capsule 0  . escitalopram (LEXAPRO) 20 MG tablet Take 20 mg by mouth daily.     . ferrous sulfate 325 (65 FE) MG tablet Take 1 tablet (325 mg total) by mouth 2 (two) times daily with a meal. 60 tablet 0  . hyoscyamine (LEVSIN, ANASPAZ) 0.125 MG tablet Take 1 tablet (0.125 mg total) by mouth 4 (four) times daily as needed for cramping. 120 tablet 5  . mirtazapine (REMERON) 30 MG tablet Take 30 mg by mouth at bedtime.     . ondansetron (ZOFRAN) 8 MG tablet Take 1 tablet (8 mg total) by mouth as needed for nausea or vomiting. 17 tablet 0  . traZODone (DESYREL) 50 MG tablet Take 50 mg by mouth at bedtime.    . hydrocortisone (ANUSOL-HC) 25 MG suppository Place 1 suppository (25 mg total) rectally every 12 (twelve) hours. (Patient not taking: Reported on 06/08/2017) 12 suppository 0  . levETIRAcetam (KEPPRA) 500 MG tablet Take 500 mg by mouth 2 (two) times daily.     No current facility-administered medications for  this visit.     Allergies as of 06/08/2017 - Review Complete 06/08/2017  Allergen Reaction Noted  . No known allergies  11/17/2015    ROS:  General: Negative for anorexia, weight loss, fever, chills, fatigue, weakness. ENT: Negative for hoarseness, difficulty swallowing , nasal congestion. CV: Negative for chest pain, angina, palpitations, dyspnea on exertion, peripheral edema.  Respiratory: Negative for dyspnea at rest, dyspnea on exertion, cough, sputum, wheezing.  GI: See history of present illness. GU:  Negative for dysuria, hematuria, urinary incontinence, urinary frequency, nocturnal urination.  Endo: Negative for unusual weight change.    Physical Examination:   BP (!) 142/92 (BP Location: Left Arm, Patient Position: Sitting, Cuff Size: Large)   Pulse 71    Temp 97.6 F (36.4 C) (Oral)   Ht 5\' 2"  (1.575 m)   Wt 197 lb 3.2 oz (89.4 kg)   BMI 36.07 kg/m   General: Well-nourished, well-developed in no acute distress.  Eyes: No icterus. Conjunctivae pink. Mouth: Oropharyngeal mucosa moist and pink , no lesions erythema or exudate. Lungs: Clear to auscultation bilaterally. Non-labored. Heart: Regular rate and rhythm, no murmurs rubs or gallops.  Abdomen: Bowel sounds are normal, nontender, nondistended, no hepatosplenomegaly or masses, no abdominal bruits or hernia , no rebound or guarding.   Extremities: No lower extremity edema. No clubbing or deformities. Neuro: Alert and oriented x 3.  Grossly intact. Skin: Warm and dry, no jaundice.   Psych: Alert and cooperative, normal mood and affect.   Imaging Studies: No results found.  Assessment and Plan:   Christina French is a 62 y.o. y/o female here to follow upfor iron deficiency anemia with no overt blood loss. Normal colonoscopy except for large internal hemorroids , received IV iron in 07/2016 . Likely present bleeding is from internal hemorrhoids. For iron deficieny anemia needs EGD+ small bowel evaluation which she has not yet had done since last visit.   Plan   1. Trulance 3 mg once daily for constipation.  2. Banding of hemorrhoids with Dr Marius Ditch 3. EGD+flexible sigmoidoscopy +/- capsule study to evaluate iron deficiency anemia. 4. CBC with iron studies    I have discussed alternative options, risks & benefits,  which include, but are not limited to, bleeding, infection, perforation,respiratory complication & drug reaction.  The patient agrees with this plan & written consent will be obtained.     Dr [name]  MD,MRCP [facility]) Follow up in 3 months

## 2017-06-10 ENCOUNTER — Telehealth: Payer: Self-pay

## 2017-06-10 NOTE — Telephone Encounter (Signed)
Advised patient of results per Dr. Vicente Males.    - Inform HB stable  Patient still contacting transporation concerning scheduling.   Patient to callback tomorrow.

## 2017-06-15 ENCOUNTER — Other Ambulatory Visit: Payer: Self-pay

## 2017-06-15 DIAGNOSIS — D509 Iron deficiency anemia, unspecified: Secondary | ICD-10-CM

## 2017-07-01 ENCOUNTER — Encounter: Payer: Self-pay | Admitting: Certified Registered Nurse Anesthetist

## 2017-07-01 ENCOUNTER — Encounter
Admission: RE | Disposition: A | Discharge status: Home / Self Care | Payer: Self-pay | Source: Ambulatory Visit | Attending: Gastroenterology

## 2017-07-01 ENCOUNTER — Ambulatory Visit: Payer: Medicare Other | Admitting: Certified Registered Nurse Anesthetist

## 2017-07-01 ENCOUNTER — Ambulatory Visit
Admission: RE | Admit: 2017-07-01 | Discharge: 2017-07-01 | Disposition: A | Discharge status: Home / Self Care | Payer: Medicare Other | Source: Ambulatory Visit | Attending: Gastroenterology | Admitting: Gastroenterology

## 2017-07-01 DIAGNOSIS — Z79899 Other long term (current) drug therapy: Secondary | ICD-10-CM | POA: Insufficient documentation

## 2017-07-01 DIAGNOSIS — Z90710 Acquired absence of both cervix and uterus: Secondary | ICD-10-CM | POA: Insufficient documentation

## 2017-07-01 DIAGNOSIS — K641 Second degree hemorrhoids: Secondary | ICD-10-CM | POA: Diagnosis not present

## 2017-07-01 DIAGNOSIS — F329 Major depressive disorder, single episode, unspecified: Secondary | ICD-10-CM | POA: Insufficient documentation

## 2017-07-01 DIAGNOSIS — K219 Gastro-esophageal reflux disease without esophagitis: Secondary | ICD-10-CM | POA: Insufficient documentation

## 2017-07-01 DIAGNOSIS — E119 Type 2 diabetes mellitus without complications: Secondary | ICD-10-CM | POA: Insufficient documentation

## 2017-07-01 DIAGNOSIS — F4310 Post-traumatic stress disorder, unspecified: Secondary | ICD-10-CM | POA: Diagnosis not present

## 2017-07-01 DIAGNOSIS — D509 Iron deficiency anemia, unspecified: Secondary | ICD-10-CM | POA: Diagnosis not present

## 2017-07-01 DIAGNOSIS — Z85828 Personal history of other malignant neoplasm of skin: Secondary | ICD-10-CM | POA: Diagnosis not present

## 2017-07-01 DIAGNOSIS — K625 Hemorrhage of anus and rectum: Secondary | ICD-10-CM | POA: Diagnosis not present

## 2017-07-01 DIAGNOSIS — K2270 Barrett's esophagus without dysplasia: Secondary | ICD-10-CM | POA: Diagnosis not present

## 2017-07-01 DIAGNOSIS — I10 Essential (primary) hypertension: Secondary | ICD-10-CM | POA: Diagnosis not present

## 2017-07-01 DIAGNOSIS — Z96641 Presence of right artificial hip joint: Secondary | ICD-10-CM | POA: Diagnosis not present

## 2017-07-01 DIAGNOSIS — K648 Other hemorrhoids: Secondary | ICD-10-CM | POA: Diagnosis not present

## 2017-07-01 DIAGNOSIS — K589 Irritable bowel syndrome without diarrhea: Secondary | ICD-10-CM | POA: Insufficient documentation

## 2017-07-01 HISTORY — PX: FLEXIBLE SIGMOIDOSCOPY: SHX5431

## 2017-07-01 HISTORY — PX: ESOPHAGOGASTRODUODENOSCOPY (EGD) WITH PROPOFOL: SHX5813

## 2017-07-01 SURGERY — ESOPHAGOGASTRODUODENOSCOPY (EGD) WITH PROPOFOL
Anesthesia: General

## 2017-07-01 MED ORDER — LIDOCAINE HCL (CARDIAC) 20 MG/ML IV SOLN
INTRAVENOUS | Status: DC | PRN
  Administered 2017-07-01: 50 mg via INTRAVENOUS

## 2017-07-01 MED ORDER — SODIUM CHLORIDE 0.9 % IV SOLN
INTRAVENOUS | Status: DC
  Administered 2017-07-01: 1000 mL via INTRAVENOUS

## 2017-07-01 MED ORDER — PROPOFOL 500 MG/50ML IV EMUL
INTRAVENOUS | Status: AC
  Filled 2017-07-01: qty 50

## 2017-07-01 MED ORDER — PROPOFOL 500 MG/50ML IV EMUL
INTRAVENOUS | Status: DC | PRN
  Administered 2017-07-01: 140 ug/kg/min via INTRAVENOUS

## 2017-07-01 MED ORDER — LIDOCAINE HCL (PF) 2 % IJ SOLN
INTRAMUSCULAR | Status: AC
  Filled 2017-07-01: qty 10

## 2017-07-01 MED ORDER — PROPOFOL 10 MG/ML IV BOLUS
INTRAVENOUS | Status: DC | PRN
Start: 2017-07-01 — End: 2017-07-01
  Administered 2017-07-01: 100 mg via INTRAVENOUS
  Administered 2017-07-01: 18 mg via INTRAVENOUS

## 2017-07-01 NOTE — Anesthesia Postprocedure Evaluation (Signed)
Anesthesia Post Note  Patient: Christina French  Procedure(s) Performed: ESOPHAGOGASTRODUODENOSCOPY (EGD) WITH PROPOFOL (N/A ) FLEXIBLE SIGMOIDOSCOPY (N/A )  Patient location during evaluation: Endoscopy Anesthesia Type: General Level of consciousness: awake and alert and oriented Pain management: pain level controlled Vital Signs Assessment: post-procedure vital signs reviewed and stable Respiratory status: spontaneous breathing, nonlabored ventilation and respiratory function stable Cardiovascular status: blood pressure returned to baseline and stable Postop Assessment: no signs of nausea or vomiting Anesthetic complications: no     Last Vitals:  Vitals:   07/01/17 0930 07/01/17 0940  BP:  123/81  Pulse:    Resp:    Temp: (!) 36.3 C   SpO2:      Last Pain:  Vitals:   07/01/17 0950  TempSrc:   PainSc: 0-No pain                 Uyen Eichholz

## 2017-07-01 NOTE — Anesthesia Preprocedure Evaluation (Signed)
Anesthesia Evaluation  Patient identified by MRN, date of birth, ID band Patient awake    Reviewed: Allergy & Precautions, NPO status , Patient's Chart, lab work & pertinent test results  History of Anesthesia Complications Negative for: history of anesthetic complications  Airway Mallampati: III  TM Distance: >3 FB Neck ROM: Full    Dental no notable dental hx.    Pulmonary neg sleep apnea, neg COPD,    breath sounds clear to auscultation- rhonchi (-) wheezing      Cardiovascular hypertension, Pt. on medications (-) CAD, (-) Past MI, (-) Cardiac Stents and (-) CABG  Rhythm:Regular Rate:Normal - Systolic murmurs and - Diastolic murmurs    Neuro/Psych Seizures -, Well Controlled,  PSYCHIATRIC DISORDERS Anxiety Depression    GI/Hepatic Neg liver ROS, GERD  ,  Endo/Other  diabetes  Renal/GU negative Renal ROS     Musculoskeletal  (+) Arthritis ,   Abdominal (+) + obese,   Peds  Hematology  (+) anemia ,   Anesthesia Other Findings Past Medical History: No date: Anemia No date: Anxiety No date: Arthritis No date: Barrett esophagus 2016 : Cancer ([place])     Comment:  Skin cancer right leg mylenoma No date: Depression 04/25/2017: Diabetes ([place]) No date: GERD (gastroesophageal reflux disease) No date: History of blood transfusion     Comment:  with a surgery No date: History of bronchitis No date: History of pneumonia No date: Hypertension No date: IBS (irritable bowel syndrome) 09/27/15: Pneumonia No date: PTSD (post-traumatic stress disorder) No date: Renal insufficiency     Comment:  reports acute kidney injury No date: Seizures ([place])     Comment:  rare - last one 07/30/14 - was very anemic.  Silent               seziure -June 2016-  No date: Vertigo     Comment:  thinks related to sinus issues   Reproductive/Obstetrics                             Anesthesia Physical Anesthesia  Plan  ASA: III  Anesthesia Plan: General   Post-op Pain Management:    Induction: Intravenous  PONV Risk Score and Plan: 2 and Propofol infusion  Airway Management Planned: Natural Airway  Additional Equipment:   Intra-op Plan:   Post-operative Plan:   Informed Consent: I have reviewed the patients History and Physical, chart, labs and discussed the procedure including the risks, benefits and alternatives for the proposed anesthesia with the patient or authorized representative who has indicated his/her understanding and acceptance.   Dental advisory given  Plan Discussed with: CRNA and Anesthesiologist  Anesthesia Plan Comments:         Anesthesia Quick Evaluation

## 2017-07-01 NOTE — Anesthesia Post-op Follow-up Note (Signed)
Anesthesia QCDR form completed.        

## 2017-07-01 NOTE — H&P (Signed)
[name], MD [address] [place], [place], [place], [address] [address], [place], [place], [place], [address] Phone: [phone]  Fax: [phone]  Primary Care Physician:  Christina Guise, MD   Pre-Procedure History & Physical: HPI:  Christina French is a 62 y.o. female is here for an endoscopy and sigmoidoscopy    Past Medical History:  Diagnosis Date  . Anemia   . Anxiety   . Arthritis   . Barrett esophagus   . Cancer ([place]) 2016    Skin cancer right leg mylenoma  . Depression   . Diabetes ([place]) 04/25/2017  . GERD (gastroesophageal reflux disease)   . History of blood transfusion    with a surgery  . History of bronchitis   . History of pneumonia   . Hypertension   . IBS (irritable bowel syndrome)   . Pneumonia 09/27/15  . PTSD (post-traumatic stress disorder)   . Renal insufficiency    reports acute kidney injury  . Seizures ([place])    rare - last one 07/30/14 - was very anemic.  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 Vertigo    thinks related to sinus issues    Past Surgical History:  Procedure Laterality Date  . ABDOMINAL HYSTERECTOMY    . CESAREAN SECTION    . COLONOSCOPY N/A 08/31/2014   Procedure: COLONOSCOPY;  Surgeon: Lucilla Lame, MD;  Location: [facility];  Service: Gastroenterology;  Laterality: N/A;  . COLONOSCOPY N/A 08/05/2016   Procedure: COLONOSCOPY;  Surgeon: [name], MD;  Location: [facility];  Service: Endoscopy;  Laterality: N/A;  . CYST EXCISION  2011   from throat  . ESOPHAGOGASTRODUODENOSCOPY N/A 08/31/2014   Procedure: ESOPHAGOGASTRODUODENOSCOPY (EGD);  Surgeon: Lucilla Lame, MD;  Location: [place] [facility];  Service: Gastroenterology;  Laterality: N/A;  . HEMORRHOID SURGERY    . INCISION AND DRAINAGE HIP Right 11/25/2014   Procedure: IRRIGATION  AND DRAINAGEOF RIGHT HIP WITH PLACEMENT OF ANTIBIOUTIC BEADS;  Surgeon: Mcarthur Rossetti, MD;  Location: [facility];  Service: Orthopedics;  Laterality: Right;  . INCISION AND  DRAINAGE HIP Right 11/28/2014   Procedure: Repeat I&D Right Hip;  Surgeon: Mcarthur Rossetti, MD;  Location: [facility];  Service: Orthopedics;  Laterality: Right;  . JOINT REPLACEMENT Right 2007   hip  . TOTAL HIP REVISION Right 03/19/2015   Procedure: RIGHT TOTAL HIP ARTHROPLASTY REVISION;  Surgeon: Meredith Pel, MD;  Location: [facility];  Service: Orthopedics;  Laterality: Right;  . TOTAL HIP REVISION Right 11/18/2015   Procedure: TOTAL HIP REVISION;  Surgeon: Frederik [facility], MD;  Location: [facility];  Service: Orthopedics;  Laterality: Right;    Prior to Admission medications   Medication Sig Start Date End Date Taking? Authorizing Provider  acetaminophen (TYLENOL) 500 MG tablet Take 1,000 mg by mouth 2 (two) times daily as needed for mild pain or headache.   Yes [provider]  ALPRAZolam Duanne Moron) 1 MG tablet Take 1 mg by mouth 3 (three) times daily as needed for anxiety.    Yes [provider]  atenolol (TENORMIN) 50 MG tablet Take 1 tablet (50 mg total) by mouth 2 (two) times daily. 04/16/17  Yes Boscia, Greer Ee, NP  carbamazepine (TEGRETOL) 200 MG tablet Take 300 mg by mouth 2 (two) times daily.    Yes [provider]  cyclobenzaprine (FLEXERIL) 10 MG tablet Take 1 tablet (10 mg total) by mouth 3 (three) times daily as needed for muscle spasms. 06/01/17  Yes Boscia, Heather E, NP  escitalopram (LEXAPRO) 20 MG tablet Take 20 mg by mouth daily.    Yes [provider]  ferrous sulfate 325 (65 FE) MG tablet Take 1 tablet (325 mg total) by mouth 2 (two) times daily with a meal. 04/27/17  Yes Wieting, Richard, MD  hyoscyamine (LEVSIN, ANASPAZ) 0.125 MG tablet Take 1 tablet (0.125 mg total) by mouth 4 (four) times daily as needed for cramping. 09/24/16  Yes [name], MD  levETIRAcetam (KEPPRA) 500 MG tablet Take 500 mg by mouth 2 (two) times daily. 05/08/16 07/01/17 Yes [provider]  mirtazapine (REMERON) 30 MG tablet Take 30 mg by mouth at bedtime.    Yes  [provider]  ondansetron (ZOFRAN) 8 MG tablet Take 1 tablet (8 mg total) by mouth as needed for nausea or vomiting. 05/24/17  Yes [name], MD  traZODone (DESYREL) 50 MG tablet Take 50 mg by mouth at bedtime.   Yes [provider]  dexlansoprazole (DEXILANT) 60 MG capsule Take 1 capsule (60 mg total) by mouth daily for 17 days. 05/24/17 06/10/17  [name], MD  hydrocortisone (ANUSOL-HC) 25 MG suppository Place 1 suppository (25 mg total) rectally every 12 (twelve) hours. Patient not taking: Reported on 06/08/2017 04/27/17 04/27/18  Loletha Grayer, MD    Allergies as of 06/15/2017 - Review Complete 06/08/2017  Allergen Reaction Noted  . No known allergies  11/17/2015    Family History  Problem Relation Age of Onset  . Alcoholism Father   . Throat cancer Mother     Social History   Socioeconomic History  . Marital status: Single    Spouse name: Not on file  . Number of children: Not on file  . Years of education: Not on file  . Highest education level: Not on file  Social Needs  . Financial resource strain: Not on file  . Food insecurity - worry: Not on file  . Food insecurity - inability: Not on file  . Transportation needs - medical: Not on file  . Transportation needs - non-medical: Not on file  Occupational History  . Not on file  Tobacco Use  . Smoking status: Never Smoker  . Smokeless tobacco: Never Used  Substance and Sexual Activity  . Alcohol use: No    Alcohol/week: 0.0 oz  . Drug use: No  . Sexual activity: Not Currently  Other Topics Concern  . Not on file  Social History Narrative  . Not on file    Review of Systems: See HPI, otherwise negative ROS  Physical Exam: BP (!) 143/83   Pulse 73   Temp 98.5 F (36.9 C) (Tympanic)   Resp 16   Ht 5\' 2"  (1.575 m)   Wt 198 lb (89.8 kg)   SpO2 100%   BMI 36.21 kg/m  General:   Alert,  pleasant and cooperative in NAD Head:  Normocephalic and atraumatic. Neck:  Supple; no masses or  thyromegaly. Lungs:  Clear throughout to auscultation, normal respiratory effort.    Heart:  +S1, +S2, Regular rate and rhythm, No edema. Abdomen:  Soft, nontender and nondistended. Normal bowel sounds, without guarding, and without rebound.   Neurologic:  Alert and  oriented x4;  grossly normal neurologically.  Impression/Plan: Christina French is here for an endoscopy and sigmoidoscopy   to be performed for  evaluation of rectal bleeding and iron deficnecy anemia    Risks, benefits, limitations, and alternatives regarding endoscopy have been reviewed with the patient.  Questions have been answered.  All parties agreeable.   [name], MD  07/01/2017, 8:51 AM

## 2017-07-01 NOTE — Op Note (Signed)
[facility] Gastroenterology Patient Name: Christina French Procedure Date: 07/01/2017 9:00 AM MRN: [id number] Account #: [id number] Date of Birth: [birth year] Admit Type: Outpatient Age: 62 Room: [facility] ENDO ROOM 3 Gender: Female Note Status: Finalized Procedure:            Upper GI endoscopy Indications:          Iron deficiency anemia Providers:            [name] MD, MD Referring MD:         [name], MD (Referring MD) Medicines:            Monitored Anesthesia Care Complications:        No immediate complications. Procedure:            Pre-Anesthesia Assessment:                       - Prior to the procedure, a History and Physical was                        performed, and patient medications, allergies and                        sensitivities were reviewed. The patient's tolerance of                        previous anesthesia was reviewed.                       - The risks and benefits of the procedure and the                        sedation options and risks were discussed with the                        patient. All questions were answered and informed                        consent was obtained.                       - ASA Grade Assessment: II - A patient with mild                        systemic disease.                       After obtaining informed consent, the endoscope was                        passed under direct vision. Throughout the procedure,                        the patient's blood pressure, pulse, and oxygen                        saturations were monitored continuously. The Endoscope                        was introduced through the mouth, and advanced to the  third part of duodenum. The upper GI endoscopy was                        accomplished with ease. The patient tolerated the                        procedure well. Findings:      The examined duodenum was normal. Biopsies for histology were taken with       a  cold forceps for evaluation of celiac disease.      The stomach was normal.      The esophagus and gastroesophageal junction were examined with white       light from a forward view and retroflexed position. There were       esophageal mucosal changes suspicious for long-segment Barrett's       esophagus. These changes involved the mucosa at the upper extent of the       gastric folds (36 cm from the incisors) extending to the Z-line (31 cm       from the incisors). Salmon-colored mucosa was present. The maximum       longitudinal extent of these esophageal mucosal changes was 5 cm in       length. Mucosa was biopsied with a cold forceps for histology. A total       of 3 specimen bottles were sent to pathology. Impression:           - Normal examined duodenum. Biopsied.                       - Normal stomach.                       - Esophageal mucosal changes suspicious for                        long-segment Barrett's esophagus. Biopsied. Recommendation:       - Perform a flexible sigmoidoscopy today. Procedure Code(s):    --- Professional ---                       [id number], Esophagogastroduodenoscopy, flexible, transoral;                        with biopsy, single or multiple Diagnosis Code(s):    --- Professional ---                       K22.8, Other specified diseases of esophagus                       D50.9, Iron deficiency anemia, unspecified CPT copyright 2016 American Medical Association. All rights reserved. The codes documented in this report are preliminary and upon coder review may  be revised to meet current compliance requirements. [name], MD [name] MD, MD 07/01/2017 9:21:52 AM This report has been signed electronically. Number of Addenda: 0 Note Initiated On: 07/01/2017 9:00 AM      [facility]

## 2017-07-01 NOTE — Op Note (Signed)
[facility] Gastroenterology Patient Name: Christina French Procedure Date: 07/01/2017 8:59 AM MRN: [id number] Account #: [id number] Date of Birth: [birth year] Admit Type: Outpatient Age: 62 Room: [facility] ENDO ROOM 3 Gender: Female Note Status: Finalized Procedure:            Flexible Sigmoidoscopy Indications:          Rectal hemorrhage Providers:            [name] MD, MD Referring MD:         [name], MD (Referring MD) Medicines:            Monitored Anesthesia Care Complications:        No immediate complications. Procedure:            Pre-Anesthesia Assessment:                       - Prior to the procedure, a History and Physical was                        performed, and patient medications, allergies and                        sensitivities were reviewed. The patient's tolerance of                        previous anesthesia was reviewed.                       - The risks and benefits of the procedure and the                        sedation options and risks were discussed with the                        patient. All questions were answered and informed                        consent was obtained.                       - ASA Grade Assessment: II - A patient with mild                        systemic disease.                       After obtaining informed consent, the scope was passed                        under direct vision. The Endoscope was introduced                        through the anus and advanced to the the sigmoid colon.                        The flexible sigmoidoscopy was accomplished with ease.                        The patient tolerated the procedure well. The quality  of the bowel preparation was adequate. Findings:      The perianal and digital rectal examinations were normal.      Non-bleeding internal hemorrhoids were found during retroflexion. The       hemorrhoids were large and Grade II (internal hemorrhoids that  prolapse       but reduce spontaneously).      The exam was otherwise without abnormality. Impression:           - Non-bleeding internal hemorrhoids.                       - The examination was otherwise normal.                       - No specimens collected. Recommendation:       - Discharge patient to home (with escort). Procedure Code(s):    --- Professional ---                       [id number], Sigmoidoscopy, flexible; diagnostic, including                        collection of specimen(s) by brushing or washing, when                        performed (separate procedure) Diagnosis Code(s):    --- Professional ---                       K64.1, Second degree hemorrhoids                       K62.5, Hemorrhage of anus and rectum CPT copyright 2016 American Medical Association. All rights reserved. The codes documented in this report are preliminary and upon coder review may  be revised to meet current compliance requirements. [name], MD [name] MD, MD 07/01/2017 9:28:46 AM This report has been signed electronically. Number of Addenda: 0 Note Initiated On: 07/01/2017 8:59 AM Total Procedure Duration: 0 hours 3 minutes 15 seconds       [facility]

## 2017-07-01 NOTE — Transfer of Care (Signed)
Immediate Anesthesia Transfer of Care Note  Patient: Christina French  Procedure(s) Performed: ESOPHAGOGASTRODUODENOSCOPY (EGD) WITH PROPOFOL (N/A ) FLEXIBLE SIGMOIDOSCOPY (N/A )  Patient Location: PACU and Endoscopy Unit  Anesthesia Type:General  Level of Consciousness: drowsy  Airway & Oxygen Therapy: Patient Spontanous Breathing and Patient connected to nasal cannula oxygen  Post-op Assessment: Report given to RN and Post -op Vital signs reviewed and stable  Post vital signs: Reviewed and stable  Last Vitals:  Vitals:   07/01/17 0833 07/01/17 0930  BP: (!) 143/83   Pulse: 73   Resp: 16   Temp: 36.9 C (!) 36.3 C  SpO2: 100%     Last Pain:  Vitals:   07/01/17 0930  TempSrc: Tympanic  PainSc: Asleep         Complications: No apparent anesthesia complications

## 2017-07-02 ENCOUNTER — Encounter: Payer: Self-pay | Admitting: Gastroenterology

## 2017-07-02 LAB — SURGICAL PATHOLOGY

## 2017-07-05 ENCOUNTER — Telehealth: Payer: Self-pay

## 2017-07-05 NOTE — Telephone Encounter (Signed)
Advised patient of results per Dr. Vicente Males.    - Inform biopsies show barrettes esophagus. Similar results seen in 2016.   Suggest continue PPI and will discuss further at next visit. Repeatr EGD for surveillance in 3 years.  - Suggest capsule study.   Patient will callback to schedule capsule study.

## 2017-09-21 ENCOUNTER — Other Ambulatory Visit: Payer: Self-pay

## 2017-09-21 DIAGNOSIS — M5440 Lumbago with sciatica, unspecified side: Secondary | ICD-10-CM

## 2017-09-21 MED ORDER — CYCLOBENZAPRINE HCL 10 MG PO TABS: 10.0000 mg | ORAL_TABLET | Freq: Three times a day (TID) | ORAL | 0 refills | Status: DC | PRN

## 2017-09-30 ENCOUNTER — Encounter: Payer: Self-pay | Admitting: Nurse Practitioner

## 2017-10-13 ENCOUNTER — Telehealth: Payer: Self-pay

## 2017-10-13 ENCOUNTER — Other Ambulatory Visit: Payer: Self-pay | Admitting: Nurse Practitioner

## 2017-10-13 MED ORDER — ATENOLOL 50 MG PO TABS: 50.0000 mg | ORAL_TABLET | Freq: Two times a day (BID) | ORAL | 5 refills | Status: DC

## 2017-10-13 NOTE — Telephone Encounter (Signed)
Contacted Christina French per Dr. Georgeann Oppenheim message:  Enquire with patient if she takes it (dexilant) chronically or just since found out to be anemic. If needed can change to prilosec 40 mg once a day.  Left a voice mail message for callback.

## 2017-10-13 NOTE — Telephone Encounter (Signed)
-----   Message from [name], MD sent at 10/09/2017 10:35 AM EDT ----- Regarding: RE: Medication Change Enquire with patient if she takes it chronically or just since found out to be anemic. If needed can change to prilosec 40 mg once a day  ----- Message ----- From: Leontine Locket, CMA Sent: 10/08/2017  11:57 AM To: [name], MD Subject: FW: Medication Change                            ----- Message ----- From: Leontine Locket, CMA Sent: 10/06/2017  12:55 PM To: [name], MD Subject: Medication Change                              The insurance sent an over-utilization letter concerning Dexilant.  She's taken it for over 90 days.   Please advise on a new medication.   Thanks Corynn Solberg C.

## 2017-10-14 ENCOUNTER — Other Ambulatory Visit: Payer: Self-pay

## 2017-10-14 DIAGNOSIS — K219 Gastro-esophageal reflux disease without esophagitis: Secondary | ICD-10-CM

## 2017-10-14 MED ORDER — OMEPRAZOLE 40 MG PO CPDR
40.0000 mg | DELAYED_RELEASE_CAPSULE | Freq: Every day | ORAL | 0 refills | Status: DC
Start: 2017-10-14 — End: 2018-01-10

## 2017-10-14 NOTE — Telephone Encounter (Signed)
Per notification from insurance changing patient from [place] to [place].  Advised patient of change.

## 2017-10-19 ENCOUNTER — Encounter: Payer: Self-pay | Admitting: Nurse Practitioner

## 2017-10-19 ENCOUNTER — Ambulatory Visit (INDEPENDENT_AMBULATORY_CARE_PROVIDER_SITE_OTHER): Payer: Medicare Other | Admitting: Nurse Practitioner

## 2017-10-19 VITALS — BP 152/82 | HR 80 | Resp 16 | Ht 61.0 in | Wt 199.8 lb

## 2017-10-19 DIAGNOSIS — M5440 Lumbago with sciatica, unspecified side: Secondary | ICD-10-CM | POA: Diagnosis not present

## 2017-10-19 DIAGNOSIS — R7301 Impaired fasting glucose: Secondary | ICD-10-CM | POA: Diagnosis not present

## 2017-10-19 DIAGNOSIS — E11649 Type 2 diabetes mellitus with hypoglycemia without coma: Secondary | ICD-10-CM | POA: Diagnosis not present

## 2017-10-19 DIAGNOSIS — I10 Essential (primary) hypertension: Secondary | ICD-10-CM

## 2017-10-19 DIAGNOSIS — F411 Generalized anxiety disorder: Secondary | ICD-10-CM

## 2017-10-19 LAB — POCT GLYCOSYLATED HEMOGLOBIN (HGB A1C): Hemoglobin A1C: 5.3 % (ref 4.0–5.6)

## 2017-10-19 MED ORDER — ALPRAZOLAM 1 MG PO TABS
1.0000 mg | ORAL_TABLET | Freq: Three times a day (TID) | ORAL | 3 refills | Status: DC | PRN
Start: 2017-10-19 — End: 2017-10-19

## 2017-10-19 MED ORDER — ALPRAZOLAM 1 MG PO TABS: 1.0000 mg | ORAL_TABLET | Freq: Three times a day (TID) | ORAL | 3 refills | Status: DC | PRN

## 2017-10-19 MED ORDER — CYCLOBENZAPRINE HCL 10 MG PO TABS: 10.0000 mg | ORAL_TABLET | Freq: Three times a day (TID) | ORAL | 3 refills | Status: DC | PRN

## 2017-10-19 NOTE — Progress Notes (Signed)
[facility] [place], [place] [address]  Internal MEDICINE  Office Visit Note  Patient Name: Christina French  [id number]  [id number]  Date of Service: 11/11/2017   Pt is here for routine follow up.   Chief Complaint  Patient presents with  . Back Pain    chronic    Back Pain  This is a chronic problem. The current episode started more than 1 year ago. The problem occurs daily. The problem is unchanged. The pain is present in the sacro-iliac. The quality of the pain is described as aching. The pain radiates to the left thigh and right thigh. The pain is moderate. The pain is the same all the time. The symptoms are aggravated by bending, sitting, standing, twisting and position. Associated symptoms include leg pain, numbness and tingling. Pertinent negatives include no abdominal pain, chest pain or dysuria. Risk factors include menopause (bilateral hip replacement and DDD). She has tried muscle relaxant, NSAIDs and walking for the symptoms. The treatment provided moderate relief.       Current Medication: Outpatient Encounter Medications as of 10/19/2017  Medication Sig  . acetaminophen (TYLENOL) 500 MG tablet Take 1,000 mg by mouth 2 (two) times daily as needed for mild pain or headache.  . ALPRAZolam (XANAX) 1 MG tablet Take 1 tablet (1 mg total) by mouth 3 (three) times daily as needed for anxiety.  Marland Kitchen atenolol (TENORMIN) 50 MG tablet Take 1 tablet (50 mg total) by mouth 2 (two) times daily.  . carbamazepine (TEGRETOL) 200 MG tablet Take 300 mg by mouth 2 (two) times daily.   . cyclobenzaprine (FLEXERIL) 10 MG tablet Take 1 tablet (10 mg total) by mouth 3 (three) times daily as needed for muscle spasms.  Marland Kitchen escitalopram (LEXAPRO) 20 MG tablet Take 20 mg by mouth daily.   . ferrous sulfate 325 (65 FE) MG tablet Take 1 tablet (325 mg total) by mouth 2 (two) times daily with a meal.  . hydrocortisone (ANUSOL-HC) 25 MG suppository Place 1 suppository (25 mg total)  rectally every 12 (twelve) hours.  . hyoscyamine (LEVSIN, ANASPAZ) 0.125 MG tablet Take 1 tablet (0.125 mg total) by mouth 4 (four) times daily as needed for cramping.  . mirtazapine (REMERON) 30 MG tablet Take 30 mg by mouth at bedtime.   Marland Kitchen omeprazole (PRILOSEC) 40 MG capsule Take 1 capsule (40 mg total) by mouth daily.  . ondansetron (ZOFRAN) 8 MG tablet Take 1 tablet (8 mg total) by mouth as needed for nausea or vomiting.  . [DISCONTINUED] ALPRAZolam (XANAX) 1 MG tablet Take 1 mg by mouth 3 (three) times daily as needed for anxiety.   . [DISCONTINUED] ALPRAZolam (XANAX) 1 MG tablet Take 1 tablet (1 mg total) by mouth 3 (three) times daily as needed for anxiety.  . [DISCONTINUED] ALPRAZolam (XANAX) 1 MG tablet Take 1 tablet (1 mg total) by mouth 3 (three) times daily as needed for anxiety.  . [DISCONTINUED] cyclobenzaprine (FLEXERIL) 10 MG tablet Take 1 tablet (10 mg total) by mouth 3 (three) times daily as needed for muscle spasms.  Marland Kitchen levETIRAcetam (KEPPRA) 500 MG tablet Take 500 mg by mouth 2 (two) times daily.  . traZODone (DESYREL) 50 MG tablet Take 50 mg by mouth at bedtime.   No facility-administered encounter medications on file as of 10/19/2017.     Surgical History: Past Surgical History:  Procedure Laterality Date  . ABDOMINAL HYSTERECTOMY    . CESAREAN SECTION    . COLONOSCOPY N/A 08/31/2014  Procedure: COLONOSCOPY;  Surgeon: Lucilla Lame, MD;  Location: [place];  Service: Gastroenterology;  Laterality: N/A;  . COLONOSCOPY N/A 08/05/2016   Procedure: COLONOSCOPY;  Surgeon: [name], MD;  Location: [facility] ENDOSCOPY;  Service: Endoscopy;  Laterality: N/A;  . CYST EXCISION  2011   from throat  . ESOPHAGOGASTRODUODENOSCOPY N/A 08/31/2014   Procedure: ESOPHAGOGASTRODUODENOSCOPY (EGD);  Surgeon: Lucilla Lame, MD;  Location: [place];  Service: Gastroenterology;  Laterality: N/A;  . ESOPHAGOGASTRODUODENOSCOPY (EGD) WITH PROPOFOL N/A 07/01/2017   Procedure:  ESOPHAGOGASTRODUODENOSCOPY (EGD) WITH PROPOFOL;  Surgeon: [name], MD;  Location: [facility] ENDOSCOPY;  Service: Gastroenterology;  Laterality: N/A;  . FLEXIBLE SIGMOIDOSCOPY N/A 07/01/2017   Procedure: FLEXIBLE SIGMOIDOSCOPY;  Surgeon: [name], MD;  Location: [facility] ENDOSCOPY;  Service: Gastroenterology;  Laterality: N/A;  . HEMORRHOID SURGERY    . INCISION AND DRAINAGE HIP Right 11/25/2014   Procedure: IRRIGATION  AND DRAINAGEOF RIGHT HIP WITH PLACEMENT OF ANTIBIOUTIC BEADS;  Surgeon: Mcarthur Rossetti, MD;  Location: [place];  Service: Orthopedics;  Laterality: Right;  . INCISION AND DRAINAGE HIP Right 11/28/2014   Procedure: Repeat I&D Right Hip;  Surgeon: Mcarthur Rossetti, MD;  Location: [place];  Service: Orthopedics;  Laterality: Right;  . JOINT REPLACEMENT Right 2007   hip  . TOTAL HIP REVISION Right 03/19/2015   Procedure: RIGHT TOTAL HIP ARTHROPLASTY REVISION;  Surgeon: Meredith Pel, MD;  Location: [place];  Service: Orthopedics;  Laterality: Right;  . TOTAL HIP REVISION Right 11/18/2015   Procedure: TOTAL HIP REVISION;  Surgeon: Frederik Pear, MD;  Location: [place];  Service: Orthopedics;  Laterality: Right;    Medical History: Past Medical History:  Diagnosis Date  . Anemia   . Anxiety   . Arthritis   . Barrett esophagus   . Cancer ([place]) 2016    Skin cancer right leg mylenoma  . Depression   . Diabetes ([place]) 04/25/2017  . GERD (gastroesophageal reflux disease)   . History of blood transfusion    with a surgery  . History of bronchitis   . History of pneumonia   . Hypertension   . IBS (irritable bowel syndrome)   . Pneumonia 09/27/15  . PTSD (post-traumatic stress disorder)   . Renal insufficiency    reports acute kidney injury  . Seizures ([place])    rare - last one 07/30/14 - was very anemic.  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 Vertigo    thinks related to sinus issues    Family History: Family History  Problem Relation Age of Onset  . Alcoholism Father   . Throat  cancer Mother     Social History   Socioeconomic History  . Marital status: Single    Spouse name: Not on file  . Number of children: Not on file  . Years of education: Not on file  . Highest education level: Not on file  Occupational History  . Not on file  Social Needs  . Financial resource strain: Not on file  . Food insecurity:    Worry: Not on file    Inability: Not on file  . Transportation needs:    Medical: Not on file    Non-medical: Not on file  Tobacco Use  . Smoking status: Never Smoker  . Smokeless tobacco: Never Used  Substance and Sexual Activity  . Alcohol use: No    Alcohol/week: 0.0 oz  . Drug use: No  . Sexual activity: Not Currently  Lifestyle  . Physical activity:  Days per week: Not on file    Minutes per session: Not on file  . Stress: Not on file  Relationships  . Social connections:    Talks on phone: Not on file    Gets together: Not on file    Attends religious service: Not on file    Active member of club or organization: Not on file    Attends meetings of clubs or organizations: Not on file    Relationship status: Not on file  . Intimate partner violence:    Fear of current or ex partner: Not on file    Emotionally abused: Not on file    Physically abused: Not on file    Forced sexual activity: Not on file  Other Topics Concern  . Not on file  Social History Narrative  . Not on file      Review of Systems  Constitutional: Negative for activity change, chills, fatigue and unexpected weight change.  HENT: Negative for congestion, postnasal drip, rhinorrhea, sneezing and sore throat.   Eyes: Negative for redness.  Respiratory: Negative for cough, chest tightness and shortness of breath.   Cardiovascular: Negative for chest pain and palpitations.       Elevated blood pressures. Generally elevated when arrives in the office. Will come down after several minutes .  Gastrointestinal: Positive for diarrhea. Negative for abdominal  pain, constipation, nausea and vomiting.  Endocrine: Negative for cold intolerance, heat intolerance, polydipsia, polyphagia and polyuria.  Genitourinary: Negative.  Negative for dysuria and frequency.  Musculoskeletal: Positive for back pain and myalgias. Negative for arthralgias, joint swelling and neck pain.       Chronic back pain, radiates to both hips and down the upper legs .  Skin: Negative for rash.  Allergic/Immunologic: Negative for environmental allergies.  Neurological: Positive for tingling and numbness. Negative for tremors.  Hematological: Negative for adenopathy. Does not bruise/bleed easily.  Psychiatric/Behavioral: Negative for behavioral problems (Depression), sleep disturbance and suicidal ideas. The patient is not nervous/anxious.     Today's Vitals   10/19/17 1053  BP: (!) 152/82  Pulse: 80  Resp: 16  SpO2: 99%  Weight: 199 lb 12.8 oz (90.6 kg)  Height: 5\' 1"  (1.549 m)    Physical Exam  Constitutional: She is oriented to person, place, and time. She appears well-developed and well-nourished. No distress.  HENT:  Head: Normocephalic and atraumatic.  Mouth/Throat: Oropharynx is clear and moist. No oropharyngeal exudate.  Eyes: Pupils are equal, round, and reactive to light. EOM are normal.  Neck: Normal range of motion. Neck supple. No JVD present. Carotid bruit is not present. No tracheal deviation present. No thyromegaly present.  Cardiovascular: Normal rate, regular rhythm and normal heart sounds. Exam reveals no gallop and no friction rub.  No murmur heard. Pulmonary/Chest: Effort normal and breath sounds normal. No respiratory distress. She has no wheezes. She has no rales. She exhibits no tenderness.  Abdominal: Soft. Bowel sounds are normal. There is no tenderness.  Musculoskeletal:  Moderate and intermittent low back pain. Worse with bending and twisting at the waist. Radiates into both hips and thighs.  Lymphadenopathy:    She has no cervical  adenopathy.  Neurological: She is alert and oriented to person, place, and time. No cranial nerve deficit.  Skin: Skin is warm and dry. She is not diaphoretic.  Psychiatric: She has a normal mood and affect. Her behavior is normal. Judgment and thought content normal.  Nursing note and vitals reviewed.  Assessment/Plan: 1. Essential hypertension  Generally stable. Continue atenolol as prescribed   2. Impaired fasting glucose - POCT HgB A1C 5.3 today. Patient controlling blood sugars through diet and exercise.   3. Low back pain of thoracolumbar region with sciatica May take flexeril up to 3 times daily as needed for muscle pain and cramping.  - cyclobenzaprine (FLEXERIL) 10 MG tablet; Take 1 tablet (10 mg total) by mouth 3 (three) times daily as needed for muscle spasms.  Dispense: 90 tablet; Refill: 3  4. Generalized anxiety disorder. Patient to conitnue regular visits with psychiatry as scheduled.    General Counseling: rolena knutson understanding of the findings of todays visit and agrees with plan of treatment. I have discussed any further diagnostic evaluation that may be needed or ordered today. We also reviewed her medications today. she has been encouraged to call the office with any questions or concerns that should arise related to todays visit.    Counseling:  This patient was seen by Leretha Pol FNP Collaboration with Dr [name] as a part of collaborative care agreement  Orders Placed This Encounter  Procedures  . POCT HgB A1C    Meds ordered this encounter  Medications  . DISCONTD: ALPRAZolam (XANAX) 1 MG tablet    Sig: Take 1 tablet (1 mg total) by mouth 3 (three) times daily as needed for anxiety.    Dispense:  90 tablet    Refill:  3    Order Specific Question:   Supervising Provider    Answer:   [name] [[id number]]  . cyclobenzaprine (FLEXERIL) 10 MG tablet    Sig: Take 1 tablet (10 mg total) by mouth 3 (three) times daily as needed for muscle  spasms.    Dispense:  90 tablet    Refill:  3    Order Specific Question:   Supervising Provider    Answer:   [name] [[id number]]  . DISCONTD: ALPRAZolam (XANAX) 1 MG tablet    Sig: Take 1 tablet (1 mg total) by mouth 3 (three) times daily as needed for anxiety.    Dispense:  90 tablet    Refill:  3    Please disregard initial prescription. This was done in error. Thanks .    Order Specific Question:   Supervising Provider    Answer:   [name] [[id number]]  . ALPRAZolam (XANAX) 1 MG tablet    Sig: Take 1 tablet (1 mg total) by mouth 3 (three) times daily as needed for anxiety.    Dispense:  90 tablet    Refill:  3    Prescribed per Dr. Kasandra Knudsen, psychiatrist.    Order Specific Question:   Supervising Provider    Answer:   [name] [[id number]]    Time spent: 33 Minutes          Dr [name] Internal medicine

## 2017-11-11 DIAGNOSIS — R7301 Impaired fasting glucose: Secondary | ICD-10-CM | POA: Insufficient documentation

## 2017-11-11 DIAGNOSIS — M5440 Lumbago with sciatica, unspecified side: Secondary | ICD-10-CM | POA: Insufficient documentation

## 2017-12-17 ENCOUNTER — Other Ambulatory Visit: Payer: Self-pay | Admitting: Adult Health

## 2017-12-17 ENCOUNTER — Telehealth: Payer: Self-pay

## 2017-12-17 DIAGNOSIS — G40909 Epilepsy, unspecified, not intractable, without status epilepticus: Secondary | ICD-10-CM

## 2017-12-17 MED ORDER — CARBAMAZEPINE 200 MG PO TABS: 300.0000 mg | ORAL_TABLET | Freq: Two times a day (BID) | ORAL | 0 refills | Status: DC

## 2017-12-17 NOTE — Telephone Encounter (Signed)
Adam sent in her tergretol on 12/17/2017 for a month's supply and Eustaquio Maize will begin her referral.

## 2017-12-17 NOTE — Progress Notes (Signed)
Referral to neurology sent so that patient can continue care, and be followed for her seizure disorder and medications.  RS for Tegretol sent to [facility].

## 2018-01-05 ENCOUNTER — Telehealth: Payer: Self-pay | Admitting: Gastroenterology

## 2018-01-05 ENCOUNTER — Other Ambulatory Visit: Payer: Self-pay | Admitting: Gastroenterology

## 2018-01-05 DIAGNOSIS — K219 Gastro-esophageal reflux disease without esophagitis: Secondary | ICD-10-CM

## 2018-01-05 NOTE — Telephone Encounter (Signed)
Pt left vm she would like to increase her stomach rx to 2 times a day please call pt

## 2018-01-10 ENCOUNTER — Other Ambulatory Visit: Payer: Self-pay

## 2018-01-10 MED ORDER — OMEPRAZOLE 40 MG PO CPDR: 40.0000 mg | DELAYED_RELEASE_CAPSULE | Freq: Two times a day (BID) | ORAL | 0 refills | Status: DC

## 2018-01-10 NOTE — Telephone Encounter (Signed)
Spoke with pt about rx dose increase. Informed pt that,per Dr. Vicente Males, it is okay to take the omeprazole 40mg  twice a day. New rx refill has been sent to pt pharmacy.

## 2018-01-11 DIAGNOSIS — G4733 Obstructive sleep apnea (adult) (pediatric): Secondary | ICD-10-CM | POA: Diagnosis not present

## 2018-01-17 ENCOUNTER — Other Ambulatory Visit: Payer: Self-pay | Admitting: Adult Health

## 2018-02-15 ENCOUNTER — Ambulatory Visit (INDEPENDENT_AMBULATORY_CARE_PROVIDER_SITE_OTHER): Payer: Medicare Other | Admitting: Nurse Practitioner

## 2018-02-15 ENCOUNTER — Ambulatory Visit: Payer: Self-pay | Admitting: Nurse Practitioner

## 2018-02-15 ENCOUNTER — Encounter: Payer: Self-pay | Admitting: Nurse Practitioner

## 2018-02-15 VITALS — BP 166/83 | HR 76 | Resp 16 | Ht 62.0 in | Wt 204.8 lb

## 2018-02-15 DIAGNOSIS — I10 Essential (primary) hypertension: Secondary | ICD-10-CM

## 2018-02-15 DIAGNOSIS — E119 Type 2 diabetes mellitus without complications: Secondary | ICD-10-CM | POA: Diagnosis not present

## 2018-02-15 DIAGNOSIS — M5440 Lumbago with sciatica, unspecified side: Secondary | ICD-10-CM | POA: Diagnosis not present

## 2018-02-15 DIAGNOSIS — Z0001 Encounter for general adult medical examination with abnormal findings: Secondary | ICD-10-CM

## 2018-02-15 LAB — POCT GLYCOSYLATED HEMOGLOBIN (HGB A1C): HEMOGLOBIN A1C: 5.6 % (ref 4.0–5.6)

## 2018-02-15 MED ORDER — CYCLOBENZAPRINE HCL 10 MG PO TABS: 10.0000 mg | ORAL_TABLET | Freq: Three times a day (TID) | ORAL | 3 refills | Status: DC | PRN

## 2018-02-15 NOTE — Progress Notes (Signed)
[facility] [place], [place] [address]  Internal MEDICINE  Office Visit Note  Patient Name: Christina French  [id number]  [id number]  Date of Service: 02/15/2018   Pt is here for routine health maintenance examination  Chief Complaint  Patient presents with  . Medicare Wellness    4 month well visit, pt state she is unable to give a urine sample  . Quality Metric Gaps    pneumonia vax, flu vaccine,   . Hypertension  . Diabetes     The patient is here for routine health maintenance exam. She is feeling well, overall. Blood pressure is slightly elevated today and this is normal for her when she arrives at the office. Generally, her blod pressure is well controlled.  She does suffer from chronic muscle pain and spasms. She relies on flexeril to help reduce the pain and keep her active. She needs to have refills for this. She sees psychiatry for depression and anxiety. Medications are stable and no changes have been made in some time.  She is due to have flu shot and pneumonia vaccine. She declines to have these immunizations at this time.     Current Medication: Outpatient Encounter Medications as of 02/15/2018  Medication Sig  . acetaminophen (TYLENOL) 500 MG tablet Take 1,000 mg by mouth 2 (two) times daily as needed for mild pain or headache.  . ALPRAZolam (XANAX) 1 MG tablet Take 1 tablet (1 mg total) by mouth 3 (three) times daily as needed for anxiety.  Marland Kitchen atenolol (TENORMIN) 50 MG tablet Take 1 tablet (50 mg total) by mouth 2 (two) times daily.  . carbamazepine (TEGRETOL) 200 MG tablet Take one and one-half tablets (300 mg total) by mouth 2 (two) times daily.  . cyclobenzaprine (FLEXERIL) 10 MG tablet Take 1 tablet (10 mg total) by mouth 3 (three) times daily as needed for muscle spasms.  Marland Kitchen escitalopram (LEXAPRO) 20 MG tablet Take 20 mg by mouth daily.   . ferrous sulfate 325 (65 FE) MG tablet Take 1 tablet (325 mg total) by mouth 2 (two) times daily  with a meal.  . hydrocortisone (ANUSOL-HC) 25 MG suppository Place 1 suppository (25 mg total) rectally every 12 (twelve) hours.  . hyoscyamine (LEVSIN, ANASPAZ) 0.125 MG tablet Take 1 tablet (0.125 mg total) by mouth 4 (four) times daily as needed for cramping.  . mirtazapine (REMERON) 30 MG tablet Take 30 mg by mouth at bedtime.   Marland Kitchen omeprazole (PRILOSEC) 40 MG capsule Take 1 capsule (40 mg total) by mouth 2 (two) times daily.  . ondansetron (ZOFRAN) 8 MG tablet Take 1 tablet (8 mg total) by mouth as needed for nausea or vomiting.  . traZODone (DESYREL) 50 MG tablet Take 50 mg by mouth at bedtime.  . [DISCONTINUED] cyclobenzaprine (FLEXERIL) 10 MG tablet Take 1 tablet (10 mg total) by mouth 3 (three) times daily as needed for muscle spasms.  Marland Kitchen levETIRAcetam (KEPPRA) 500 MG tablet Take 500 mg by mouth 2 (two) times daily.   No facility-administered encounter medications on file as of 02/15/2018.     Surgical History: Past Surgical History:  Procedure Laterality Date  . ABDOMINAL HYSTERECTOMY    . CESAREAN SECTION    . COLONOSCOPY N/A 08/31/2014   Procedure: COLONOSCOPY;  Surgeon: Lucilla Lame, MD;  Location: [place];  Service: Gastroenterology;  Laterality: N/A;  . COLONOSCOPY N/A 08/05/2016   Procedure: COLONOSCOPY;  Surgeon: [name], MD;  Location: [facility] ENDOSCOPY;  Service: Endoscopy;  Laterality: N/A;  . CYST EXCISION  2011   from throat  . ESOPHAGOGASTRODUODENOSCOPY N/A 08/31/2014   Procedure: ESOPHAGOGASTRODUODENOSCOPY (EGD);  Surgeon: Lucilla Lame, MD;  Location: [place];  Service: Gastroenterology;  Laterality: N/A;  . ESOPHAGOGASTRODUODENOSCOPY (EGD) WITH PROPOFOL N/A 07/01/2017   Procedure: ESOPHAGOGASTRODUODENOSCOPY (EGD) WITH PROPOFOL;  Surgeon: [name], MD;  Location: [facility] ENDOSCOPY;  Service: Gastroenterology;  Laterality: N/A;  . FLEXIBLE SIGMOIDOSCOPY N/A 07/01/2017   Procedure: FLEXIBLE SIGMOIDOSCOPY;  Surgeon: [name], MD;  Location: [facility]  ENDOSCOPY;  Service: Gastroenterology;  Laterality: N/A;  . HEMORRHOID SURGERY    . INCISION AND DRAINAGE HIP Right 11/25/2014   Procedure: IRRIGATION  AND DRAINAGEOF RIGHT HIP WITH PLACEMENT OF ANTIBIOUTIC BEADS;  Surgeon: Mcarthur Rossetti, MD;  Location: [place];  Service: Orthopedics;  Laterality: Right;  . INCISION AND DRAINAGE HIP Right 11/28/2014   Procedure: Repeat I&D Right Hip;  Surgeon: Mcarthur Rossetti, MD;  Location: [place];  Service: Orthopedics;  Laterality: Right;  . JOINT REPLACEMENT Right 2007   hip  . TOTAL HIP REVISION Right 03/19/2015   Procedure: RIGHT TOTAL HIP ARTHROPLASTY REVISION;  Surgeon: Meredith Pel, MD;  Location: [place];  Service: Orthopedics;  Laterality: Right;  . TOTAL HIP REVISION Right 11/18/2015   Procedure: TOTAL HIP REVISION;  Surgeon: Frederik Pear, MD;  Location: [place];  Service: Orthopedics;  Laterality: Right;    Medical History: Past Medical History:  Diagnosis Date  . Anemia   . Anxiety   . Arthritis   . Barrett esophagus   . Cancer ([place]) 2016    Skin cancer right leg mylenoma  . Depression   . Diabetes ([place]) 04/25/2017  . GERD (gastroesophageal reflux disease)   . History of blood transfusion    with a surgery  . History of bronchitis   . History of pneumonia   . Hypertension   . IBS (irritable bowel syndrome)   . Pneumonia 09/27/15  . PTSD (post-traumatic stress disorder)   . Renal insufficiency    reports acute kidney injury  . Seizures ([place])    rare - last one 07/30/14 - was very anemic.  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 Vertigo    thinks related to sinus issues    Family History: Family History  Problem Relation Age of Onset  . Alcoholism Father   . Throat cancer Mother       Review of Systems  Constitutional: Negative for activity change, chills, fatigue and unexpected weight change.  HENT: Negative for congestion, postnasal drip, rhinorrhea, sneezing and sore throat.   Eyes: Negative for redness.   Respiratory: Negative for cough, chest tightness and shortness of breath.   Cardiovascular: Negative for chest pain and palpitations.       Elevated blood pressures. Generally elevated when arrives in the office. Will come down after several minutes .  Gastrointestinal: Negative for abdominal pain, constipation, diarrhea, nausea and vomiting.  Endocrine: Negative for cold intolerance, heat intolerance, polydipsia, polyphagia and polyuria.  Genitourinary: Negative.  Negative for dysuria and frequency.  Musculoskeletal: Positive for back pain and myalgias. Negative for arthralgias, joint swelling and neck pain.       Chronic back pain, radiates to both hips and down the upper legs .  Skin: Negative for rash.  Allergic/Immunologic: Negative for environmental allergies.  Neurological: Positive for seizures and numbness. Negative for tremors.       Well managed on current medications.   Hematological: Negative for adenopathy. Does not bruise/bleed easily.  Psychiatric/Behavioral: Negative for behavioral problems (Depression), sleep disturbance and suicidal ideas. The patient is not nervous/anxious.      Vital Signs: Today's Vitals   02/15/18 1507  BP: (!) 166/83  Pulse: 76  Resp: 16  SpO2: 99%  Weight: 204 lb 12.8 oz (92.9 kg)  Height: 5\' 2"  (1.575 m)    Physical Exam  Constitutional: She is oriented to person, place, and time. She appears well-developed and well-nourished. No distress.  HENT:  Head: Normocephalic and atraumatic.  Mouth/Throat: Oropharynx is clear and moist. No oropharyngeal exudate.  Eyes: Pupils are equal, round, and reactive to light. EOM are normal.  Neck: Normal range of motion. Neck supple. No JVD present. Carotid bruit is not present. No tracheal deviation present. No thyromegaly present.  Cardiovascular: Normal rate, regular rhythm and normal heart sounds. Exam reveals no gallop and no friction rub.  No murmur heard. Pulmonary/Chest: Effort normal and breath  sounds normal. No respiratory distress. She has no wheezes. She has no rales. She exhibits no tenderness.  Abdominal: Soft. Bowel sounds are normal. There is no tenderness.  Musculoskeletal:  Moderate and intermittent low back pain. Worse with bending and twisting at the waist. Radiates into both hips and thighs.  Lymphadenopathy:    She has no cervical adenopathy.  Neurological: She is alert and oriented to person, place, and time. No cranial nerve deficit.  Skin: Skin is warm and dry. She is not diaphoretic.  Psychiatric: She has a normal mood and affect. Her behavior is normal. Judgment and thought content normal.  Nursing note and vitals reviewed.  Depression screen [facility] 2/9 02/15/2018 10/19/2017 06/01/2017 04/16/2015 02/18/2015  Decreased Interest 0 0 0 0 0  Down, Depressed, Hopeless 0 0 0 0 0  PHQ - 2 Score 0 0 0 0 0    Functional Status Survey: Is the patient deaf or have difficulty hearing?: No Does the patient have difficulty seeing, even when wearing glasses/contacts?: No Does the patient have difficulty concentrating, remembering, or making decisions?: No Does the patient have difficulty walking or climbing stairs?: Yes Does the patient have difficulty dressing or bathing?: No Does the patient have difficulty doing errands alone such as visiting a doctor's office or shopping?: No  MMSE - [place] Exam 02/15/2018  Orientation to time 5  Orientation to Place 5  Registration 3  Attention/ Calculation 5  Recall 3  Language- name 2 objects 2  Language- repeat 1  Language- follow 3 step command 3  Language- read & follow direction 1  Write a sentence 1  Copy design 1  Total score 30    Fall Risk  02/15/2018 10/19/2017 06/01/2017 04/16/2015 02/18/2015  Falls in the past year? Yes Yes No No No  Number falls in past yr: 2 or more 1 - - -  Injury with Fall? No No - - -  Risk for fall due to : - - - Impaired mobility -     LABS: Recent Results (from the past 2160  hour(s))  POCT HgB A1C     Status: None   Collection Time: 02/15/18  3:19 PM  Result Value Ref Range   Hemoglobin A1C 5.6 4.0 - 5.6 %   HbA1c POC (<> result, manual entry)     HbA1c, POC (prediabetic range)     HbA1c, POC (controlled diabetic range)     Assessment/Plan: 1. Encounter for general adult medical examination with abnormal findings Annual wellness visit today  2. Type 2 diabetes mellitus without complication,  without long-term current use of insulin (HCC) - POCT HgB A1C 5.6 today. Continue to control through diet. Will monitor closely.   3. Essential hypertension Generally stable. Continue bp medication as prescribed   4. Low back pain of thoracolumbar region with sciatica May continue to take flexeril as needed and as prescribed for lower back pain. Should take with caution as this may cause dizziness or drowsiness  - cyclobenzaprine (FLEXERIL) 10 MG tablet; Take 1 tablet (10 mg total) by mouth 3 (three) times daily as needed for muscle spasms.  Dispense: 90 tablet; Refill: 3  General Counseling: reida hem understanding of the findings of todays visit and agrees with plan of treatment. I have discussed any further diagnostic evaluation that may be needed or ordered today. We also reviewed her medications today. she has been encouraged to call the office with any questions or concerns that should arise related to todays visit.    Counseling:  Hypertension Counseling:   The following hypertensive lifestyle modification were recommended and discussed:  1. Limiting alcohol intake to less than 1 oz/day of ethanol:(24 oz of beer or 8 oz of wine or 2 oz of 100-proof whiskey). 2. Take baby ASA 81 mg daily. 3. Importance of regular aerobic exercise and losing weight. 4. Reduce dietary saturated fat and cholesterol intake for overall cardiovascular health. 5. Maintaining adequate dietary potassium, calcium, and magnesium intake. 6. Regular monitoring of the blood  pressure. 7. Reduce sodium intake to less than 100 mmol/day (less than 2.3 gm of sodium or less than 6 gm of sodium choride)   This patient was seen by [place] with Dr [name] as a part of collaborative care agreement  Orders Placed This Encounter  Procedures  . POCT HgB A1C    Meds ordered this encounter  Medications  . cyclobenzaprine (FLEXERIL) 10 MG tablet    Sig: Take 1 tablet (10 mg total) by mouth 3 (three) times daily as needed for muscle spasms.    Dispense:  90 tablet    Refill:  3    Order Specific Question:   Supervising Provider    Answer:   [name] [[id number]]    Time spent: [place], MD  Internal Medicine

## 2018-04-05 ENCOUNTER — Other Ambulatory Visit
Admission: RE | Admit: 2018-04-05 | Discharge: 2018-04-05 | Disposition: A | Discharge status: Home / Self Care | Payer: Medicare Other | Source: Ambulatory Visit | Attending: Nurse Practitioner | Admitting: Nurse Practitioner

## 2018-04-05 DIAGNOSIS — E559 Vitamin D deficiency, unspecified: Secondary | ICD-10-CM | POA: Insufficient documentation

## 2018-04-05 DIAGNOSIS — Z0000 Encounter for general adult medical examination without abnormal findings: Secondary | ICD-10-CM | POA: Diagnosis not present

## 2018-04-05 DIAGNOSIS — I10 Essential (primary) hypertension: Secondary | ICD-10-CM | POA: Diagnosis not present

## 2018-04-05 LAB — LIPID PANEL
CHOL/HDL RATIO: 4.2 ratio
CHOLESTEROL: 182 mg/dL (ref 0–200)
HDL: 43 mg/dL (ref >40)
LDL Cholesterol: 91 mg/dL (ref 0–99)
TRIGLYCERIDES: 238 mg/dL — AB (ref <150)
VLDL: 48 mg/dL — ABNORMAL HIGH (ref 0–40)

## 2018-04-05 LAB — COMPREHENSIVE METABOLIC PANEL
ALBUMIN: 4.3 g/dL (ref 3.5–5.0)
ALK PHOS: 88 U/L (ref 38–126)
ALT: 14 U/L (ref 0–44)
AST: 19 U/L (ref 15–41)
Anion gap: 11 (ref 5–15)
BUN: 10 mg/dL (ref 6–20)
CALCIUM: 9.3 mg/dL (ref 8.9–10.3)
CO2: 25 mmol/L (ref 22–32)
Chloride: 90 mmol/L — ABNORMAL LOW (ref 98–111)
Creatinine, Ser: 0.75 mg/dL (ref 0.44–1.00)
GLUCOSE: 112 mg/dL — AB (ref 70–99)
POTASSIUM: 4.4 mmol/L (ref 3.5–5.1)
Sodium: 126 mmol/L — ABNORMAL LOW (ref 135–145)
TOTAL PROTEIN: 7.3 g/dL (ref 6.5–8.1)
Total Bilirubin: 0.5 mg/dL (ref 0.3–1.2)

## 2018-04-05 LAB — CBC
HEMATOCRIT: 31.6 % — AB (ref 36.0–46.0)
HEMOGLOBIN: 9.8 g/dL — AB (ref 12.0–15.0)
MCH: 22.5 pg — AB (ref 26.0–34.0)
MCHC: 31 g/dL (ref 30.0–36.0)
MCV: 72.5 fL — AB (ref 80.0–100.0)
Platelets: 200 10*3/uL (ref 150–400)
RBC: 4.36 MIL/uL (ref 3.87–5.11)
RDW: 16.5 % — AB (ref 11.5–15.5)
WBC: 5.8 10*3/uL (ref 4.0–10.5)
nRBC: 0 % (ref 0.0–0.2)

## 2018-04-05 LAB — TSH: TSH: 0.07 u[IU]/mL — AB (ref 0.350–4.500)

## 2018-04-05 LAB — T4, FREE: Free T4: 0.72 ng/dL — ABNORMAL LOW (ref 0.82–1.77)

## 2018-04-06 LAB — VITAMIN D 25 HYDROXY (VIT D DEFICIENCY, FRACTURES): VIT D 25 HYDROXY: 35.4 ng/mL (ref 30.0–100.0)

## 2018-04-11 ENCOUNTER — Telehealth: Payer: Self-pay

## 2018-04-11 NOTE — Telephone Encounter (Signed)
Mailed lab slip to patient per Nira Conn.

## 2018-04-12 ENCOUNTER — Telehealth: Payer: Self-pay | Admitting: Gastroenterology

## 2018-04-12 NOTE — Telephone Encounter (Signed)
Pt pharmacy has been changed

## 2018-04-12 NOTE — Telephone Encounter (Signed)
Pt pharmacy has closed down. Would like all medications to be filled [facility] [place]  [phone]

## 2018-04-15 ENCOUNTER — Other Ambulatory Visit: Payer: Self-pay

## 2018-04-15 MED ORDER — OMEPRAZOLE 40 MG PO CPDR: 40.0000 mg | DELAYED_RELEASE_CAPSULE | Freq: Two times a day (BID) | ORAL | 1 refills | Status: DC

## 2018-04-15 NOTE — Progress Notes (Signed)
Pt called to request a refill on omeprazole. Pt is aware that we've sent the refill to her preferred pharmacy

## 2018-05-02 ENCOUNTER — Other Ambulatory Visit: Payer: Self-pay

## 2018-05-02 MED ORDER — ATENOLOL 50 MG PO TABS: 50.0000 mg | ORAL_TABLET | Freq: Two times a day (BID) | ORAL | 5 refills | Status: DC

## 2018-05-24 DIAGNOSIS — Z79899 Other long term (current) drug therapy: Secondary | ICD-10-CM | POA: Diagnosis not present

## 2018-06-16 ENCOUNTER — Encounter: Payer: Self-pay | Admitting: Nurse Practitioner

## 2018-06-16 ENCOUNTER — Ambulatory Visit (INDEPENDENT_AMBULATORY_CARE_PROVIDER_SITE_OTHER): Payer: Medicare Other | Admitting: Nurse Practitioner

## 2018-06-16 VITALS — BP 138/78 | HR 78 | Resp 16 | Ht 61.0 in | Wt 201.8 lb

## 2018-06-16 DIAGNOSIS — I10 Essential (primary) hypertension: Secondary | ICD-10-CM | POA: Diagnosis not present

## 2018-06-16 DIAGNOSIS — Z1239 Encounter for other screening for malignant neoplasm of breast: Secondary | ICD-10-CM | POA: Diagnosis not present

## 2018-06-16 DIAGNOSIS — M5440 Lumbago with sciatica, unspecified side: Secondary | ICD-10-CM | POA: Diagnosis not present

## 2018-06-16 MED ORDER — CYCLOBENZAPRINE HCL 10 MG PO TABS: 10.0000 mg | ORAL_TABLET | Freq: Three times a day (TID) | ORAL | 3 refills | Status: DC | PRN

## 2018-06-16 NOTE — Progress Notes (Signed)
[facility] [place], [place] [address]  Internal MEDICINE  Office Visit Note  Patient Name: Christina French  [id number]  [id number]  Date of Service: 06/29/2018  Chief Complaint  Patient presents with  . Medical Management of Chronic Issues    4 month follow up, pt is concerned that she is going through menopause and having noght sweats and shakes    The patient is here for follow up visit. She is having hot flashes and moderate night sweats. Waking up to change shirts and even change sheets because she sweats so much. Does not want to take any hormone replacement.  Does need to have refill for her flexeril which she takes up to three times daily for back pain and muscle spasms.       Current Medication: Outpatient Encounter Medications as of 06/16/2018  Medication Sig  . acetaminophen (TYLENOL) 500 MG tablet Take 1,000 mg by mouth 2 (two) times daily as needed for mild pain or headache.  . ALPRAZolam (XANAX) 1 MG tablet Take 1 tablet (1 mg total) by mouth 3 (three) times daily as needed for anxiety.  Marland Kitchen atenolol (TENORMIN) 50 MG tablet Take 1 tablet (50 mg total) by mouth 2 (two) times daily.  . carbamazepine (TEGRETOL) 200 MG tablet Take one and one-half tablets (300 mg total) by mouth 2 (two) times daily.  . cyclobenzaprine (FLEXERIL) 10 MG tablet Take 1 tablet (10 mg total) by mouth 3 (three) times daily as needed for muscle spasms.  Marland Kitchen escitalopram (LEXAPRO) 20 MG tablet Take 20 mg by mouth daily.   . ferrous sulfate 325 (65 FE) MG tablet Take 1 tablet (325 mg total) by mouth 2 (two) times daily with a meal.  . hyoscyamine (LEVSIN, ANASPAZ) 0.125 MG tablet Take 1 tablet (0.125 mg total) by mouth 4 (four) times daily as needed for cramping.  . mirtazapine (REMERON) 30 MG tablet Take 30 mg by mouth at bedtime.   Marland Kitchen omeprazole (PRILOSEC) 40 MG capsule Take 1 capsule (40 mg total) by mouth 2 (two) times daily.  . ondansetron (ZOFRAN) 8 MG tablet Take 1 tablet (8  mg total) by mouth as needed for nausea or vomiting.  . traZODone (DESYREL) 50 MG tablet Take 50 mg by mouth at bedtime.  . [DISCONTINUED] cyclobenzaprine (FLEXERIL) 10 MG tablet Take 1 tablet (10 mg total) by mouth 3 (three) times daily as needed for muscle spasms.  Marland Kitchen levETIRAcetam (KEPPRA) 500 MG tablet Take 500 mg by mouth 2 (two) times daily.   No facility-administered encounter medications on file as of 06/16/2018.     Surgical History: Past Surgical History:  Procedure Laterality Date  . ABDOMINAL HYSTERECTOMY    . CESAREAN SECTION    . COLONOSCOPY N/A 08/31/2014   Procedure: COLONOSCOPY;  Surgeon: Lucilla Lame, MD;  Location: [place];  Service: Gastroenterology;  Laterality: N/A;  . COLONOSCOPY N/A 08/05/2016   Procedure: COLONOSCOPY;  Surgeon: [name], MD;  Location: [facility] ENDOSCOPY;  Service: Endoscopy;  Laterality: N/A;  . CYST EXCISION  2011   from throat  . ESOPHAGOGASTRODUODENOSCOPY N/A 08/31/2014   Procedure: ESOPHAGOGASTRODUODENOSCOPY (EGD);  Surgeon: Lucilla Lame, MD;  Location: [place];  Service: Gastroenterology;  Laterality: N/A;  . ESOPHAGOGASTRODUODENOSCOPY (EGD) WITH PROPOFOL N/A 07/01/2017   Procedure: ESOPHAGOGASTRODUODENOSCOPY (EGD) WITH PROPOFOL;  Surgeon: [name], MD;  Location[facility] ENDOSCOPY;  Service: Gastroenterology;  Laterality: N/A;  . FLEXIBLE SIGMOIDOSCOPY N/A 07/01/2017   Procedure: FLEXIBLE SIGMOIDOSCOPY;  Surgeon: [name], MD;  Location: [facility] ENDOSCOPY;  Service: Gastroenterology;  Laterality: N/A;  . HEMORRHOID SURGERY    . INCISION AND DRAINAGE HIP Right 11/25/2014   Procedure: IRRIGATION  AND DRAINAGEOF RIGHT HIP WITH PLACEMENT OF ANTIBIOUTIC BEADS;  Surgeon: Mcarthur Rossetti, MD;  Location: [place];  Service: Orthopedics;  Laterality: Right;  . INCISION AND DRAINAGE HIP Right 11/28/2014   Procedure: Repeat I&D Right Hip;  Surgeon: Mcarthur Rossetti, MD;  Location: [place];  Service: Orthopedics;  Laterality: Right;  .  JOINT REPLACEMENT Right 2007   hip  . TOTAL HIP REVISION Right 03/19/2015   Procedure: RIGHT TOTAL HIP ARTHROPLASTY REVISION;  Surgeon: Meredith Pel, MD;  Location: [place];  Service: Orthopedics;  Laterality: Right;  . TOTAL HIP REVISION Right 11/18/2015   Procedure: TOTAL HIP REVISION;  Surgeon: Frederik Pear, MD;  Location: [place];  Service: Orthopedics;  Laterality: Right;    Medical History: Past Medical History:  Diagnosis Date  . Anemia   . Anxiety   . Arthritis   . Barrett esophagus   . Cancer ([place]) 2016    Skin cancer right leg mylenoma  . Depression   . Diabetes ([place]) 04/25/2017  . GERD (gastroesophageal reflux disease)   . History of blood transfusion    with a surgery  . History of bronchitis   . History of pneumonia   . Hypertension   . IBS (irritable bowel syndrome)   . Pneumonia 09/27/15  . PTSD (post-traumatic stress disorder)   . Renal insufficiency    reports acute kidney injury  . Seizures ([place])    rare - last one 07/30/14 - was very anemic.  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 Vertigo    thinks related to sinus issues    Family History: Family History  Problem Relation Age of Onset  . Alcoholism Father   . Throat cancer Mother     Social History   Socioeconomic History  . Marital status: Single    Spouse name: Not on file  . Number of children: Not on file  . Years of education: Not on file  . Highest education level: Not on file  Occupational History  . Not on file  Social Needs  . Financial resource strain: Not on file  . Food insecurity:    Worry: Not on file    Inability: Not on file  . Transportation needs:    Medical: Not on file    Non-medical: Not on file  Tobacco Use  . Smoking status: Never Smoker  . Smokeless tobacco: Never Used  Substance and Sexual Activity  . Alcohol use: No    Alcohol/week: 0.0 standard drinks  . Drug use: No  . Sexual activity: Not Currently  Lifestyle  . Physical activity:    Days per week: Not on file     Minutes per session: Not on file  . Stress: Not on file  Relationships  . Social connections:    Talks on phone: Not on file    Gets together: Not on file    Attends religious service: Not on file    Active member of club or organization: Not on file    Attends meetings of clubs or organizations: Not on file    Relationship status: Not on file  . Intimate partner violence:    Fear of current or ex partner: Not on file    Emotionally abused: Not on file    Physically abused: Not on file    Forced  sexual activity: Not on file  Other Topics Concern  . Not on file  Social History Narrative  . Not on file      Review of Systems  Constitutional: Negative for activity change, chills, fatigue and unexpected weight change.  HENT: Negative for congestion, postnasal drip, rhinorrhea, sneezing and sore throat.   Eyes: Negative for redness.  Respiratory: Negative for cough, chest tightness and shortness of breath.   Cardiovascular: Negative for chest pain and palpitations.       Elevated blood pressures. Generally elevated when arrives in the office. Will come down after several minutes .  Gastrointestinal: Negative for abdominal pain, constipation, diarrhea, nausea and vomiting.  Endocrine: Negative for cold intolerance, heat intolerance, polydipsia and polyuria.       Thyroid panel abnormal when labs were checked in 03/2018.   Musculoskeletal: Positive for back pain and myalgias. Negative for arthralgias, joint swelling and neck pain.       Chronic back pain, radiates to both hips and down the upper legs .  Skin: Negative for rash.  Allergic/Immunologic: Negative for environmental allergies.  Neurological: Positive for seizures and numbness. Negative for tremors.       Well managed on current medications.   Hematological: Negative for adenopathy. Does not bruise/bleed easily.  Psychiatric/Behavioral: Negative for behavioral problems (Depression), sleep disturbance and suicidal ideas.  The patient is not nervous/anxious.     Today's Vitals   06/16/18 1355  BP: 138/78  Pulse: 78  Resp: 16  SpO2: 97%  Weight: 201 lb 12.8 oz (91.5 kg)  Height: 5\' 1"  (1.549 m)   Body mass index is 38.13 kg/m.  Physical Exam Vitals signs and nursing note reviewed.  Constitutional:      General: She is not in acute distress.    Appearance: Normal appearance. She is well-developed. She is not diaphoretic.  HENT:     Head: Normocephalic and atraumatic.     Mouth/Throat:     Pharynx: No oropharyngeal exudate.  Eyes:     Pupils: Pupils are equal, round, and reactive to light.  Neck:     Musculoskeletal: Normal range of motion and neck supple.     Thyroid: No thyromegaly.     Vascular: No carotid bruit or JVD.     Trachea: No tracheal deviation.  Cardiovascular:     Rate and Rhythm: Normal rate and regular rhythm.     Heart sounds: Normal heart sounds. No murmur. No friction rub. No gallop.   Pulmonary:     Effort: Pulmonary effort is normal. No respiratory distress.     Breath sounds: Normal breath sounds. No wheezing or rales.  Chest:     Chest wall: No tenderness.  Abdominal:     General: Bowel sounds are normal.     Palpations: Abdomen is soft.     Tenderness: There is no abdominal tenderness.  Musculoskeletal:     Comments: Moderate and intermittent low back pain. Worse with bending and twisting at the waist. Radiates into both hips and thighs.  Lymphadenopathy:     Cervical: No cervical adenopathy.  Skin:    General: Skin is warm and dry.  Neurological:     Mental Status: She is alert and oriented to person, place, and time. Mental status is at baseline.     Cranial Nerves: No cranial nerve deficit.  Psychiatric:        Behavior: Behavior normal.        Thought Content: Thought content normal.  Judgment: Judgment normal.   Assessment/Plan: 1. Low back pain of thoracolumbar region with sciatica May continue flexeril up th three times daily if needed for  low back pain and muscle spasms. Refills sent to her pharmacy today.  - cyclobenzaprine (FLEXERIL) 10 MG tablet; Take 1 tablet (10 mg total) by mouth 3 (three) times daily as needed for muscle spasms.  Dispense: 90 tablet; Refill: 3  2. Essential hypertension Stable. Continue atenolol as prescribed   3. Screening for breast cancer - MM DIGITAL SCREENING BILATERAL; Future  General Counseling: telly [place]berg understanding of the findings of todays visit and agrees with plan of treatment. I have discussed any further diagnostic evaluation that may be needed or ordered today. We also reviewed her medications today. she has been encouraged to call the office with any questions or concerns that should arise related to todays visit.  This patient was seen by Leretha Pol FNP Collaboration with Dr [name] as a part of collaborative care agreement  Orders Placed This Encounter  Procedures  . MM DIGITAL SCREENING BILATERAL    Meds ordered this encounter  Medications  . cyclobenzaprine (FLEXERIL) 10 MG tablet    Sig: Take 1 tablet (10 mg total) by mouth 3 (three) times daily as needed for muscle spasms.    Dispense:  90 tablet    Refill:  3    Order Specific Question:   Supervising Provider    Answer:   [name] [[id number]]    Time spent: 52 Minutes      Dr [name] Internal medicine

## 2018-07-08 ENCOUNTER — Other Ambulatory Visit
Admission: RE | Admit: 2018-07-08 | Discharge: 2018-07-08 | Disposition: A | Discharge status: Home / Self Care | Payer: Medicare Other | Source: Ambulatory Visit | Attending: Nurse Practitioner | Admitting: Nurse Practitioner

## 2018-07-08 DIAGNOSIS — D649 Anemia, unspecified: Secondary | ICD-10-CM | POA: Insufficient documentation

## 2018-07-08 DIAGNOSIS — R946 Abnormal results of thyroid function studies: Secondary | ICD-10-CM | POA: Insufficient documentation

## 2018-07-08 DIAGNOSIS — E871 Hypo-osmolality and hyponatremia: Secondary | ICD-10-CM | POA: Insufficient documentation

## 2018-07-08 LAB — CBC
HCT: 29.6 % — ABNORMAL LOW (ref 36.0–46.0)
Hemoglobin: 9.2 g/dL — ABNORMAL LOW (ref 12.0–15.0)
MCH: 21.8 pg — ABNORMAL LOW (ref 26.0–34.0)
MCHC: 31.1 g/dL (ref 30.0–36.0)
MCV: 70.1 fL — AB (ref 80.0–100.0)
Platelets: 180 10*3/uL (ref 150–400)
RBC: 4.22 MIL/uL (ref 3.87–5.11)
RDW: 14.7 % (ref 11.5–15.5)
WBC: 6.1 10*3/uL (ref 4.0–10.5)
nRBC: 0 % (ref 0.0–0.2)

## 2018-07-08 LAB — BASIC METABOLIC PANEL
Anion gap: 11 (ref 5–15)
BUN: 13 mg/dL (ref 8–23)
CO2: 24 mmol/L (ref 22–32)
Calcium: 9.2 mg/dL (ref 8.9–10.3)
Chloride: 87 mmol/L — ABNORMAL LOW (ref 98–111)
Creatinine, Ser: 0.69 mg/dL (ref 0.44–1.00)
GFR calc Af Amer: >60 mL/min (ref >60)
GFR calc non Af Amer: >60 mL/min (ref >60)
Glucose, Bld: 121 mg/dL — ABNORMAL HIGH (ref 70–99)
Potassium: 4.4 mmol/L (ref 3.5–5.1)
Sodium: 122 mmol/L — ABNORMAL LOW (ref 135–145)

## 2018-07-08 LAB — T4, FREE: Free T4: 0.87 ng/dL (ref 0.82–1.77)

## 2018-07-08 LAB — TSH: TSH: 0.018 u[IU]/mL — ABNORMAL LOW (ref 0.350–4.500)

## 2018-10-07 ENCOUNTER — Other Ambulatory Visit: Payer: Self-pay

## 2018-10-07 ENCOUNTER — Encounter: Payer: Self-pay | Admitting: Nurse Practitioner

## 2018-10-07 ENCOUNTER — Ambulatory Visit (INDEPENDENT_AMBULATORY_CARE_PROVIDER_SITE_OTHER): Payer: Medicare Other | Admitting: Nurse Practitioner

## 2018-10-07 VITALS — Resp 16 | Ht 61.0 in | Wt 190.0 lb

## 2018-10-07 DIAGNOSIS — I10 Essential (primary) hypertension: Secondary | ICD-10-CM

## 2018-10-07 DIAGNOSIS — F419 Anxiety disorder, unspecified: Secondary | ICD-10-CM

## 2018-10-07 DIAGNOSIS — M5440 Lumbago with sciatica, unspecified side: Secondary | ICD-10-CM

## 2018-10-07 MED ORDER — CYCLOBENZAPRINE HCL 10 MG PO TABS: 10.0000 mg | ORAL_TABLET | Freq: Three times a day (TID) | ORAL | 3 refills | Status: DC | PRN

## 2018-10-07 NOTE — Progress Notes (Signed)
[facility] [place], [place] [address]  Internal MEDICINE  Telephone Visit  Patient Name: Christina French  [id number]  [id number]  Date of Service: 10/07/2018  I connected with the patient at 12:38pm by telephone and verified the patients identity using two identifiers.   I discussed the limitations, risks, security and privacy concerns of performing an evaluation and management service by telephone and the availability of in person appointments. I also discussed with the patient that there may be a patient responsible charge related to the service.  The patient expressed understanding and agrees to proceed.    Chief Complaint  Patient presents with  . Telephone Screen  . Telephone Assessment  . Hypertension  . Gastroesophageal Reflux  . Back Pain    lower back     The patient has been contacted via telephone for follow up visit due to concerns for spread of novel coronavirus. She does suffer from chronic muscle pain and spasms. She relies on flexeril to help reduce the pain and keep her active. She needs to have refills for this. She sees psychiatry for depression and anxiety. Sees neurology for chronic seizure disorder. Medications are stable and no changes have been made in some time.        Current Medication: Outpatient Encounter Medications as of 10/07/2018  Medication Sig  . acetaminophen (TYLENOL) 500 MG tablet Take 1,000 mg by mouth 2 (two) times daily as needed for mild pain or headache.  . ALPRAZolam (XANAX) 1 MG tablet Take 1 tablet (1 mg total) by mouth 3 (three) times daily as needed for anxiety.  Marland Kitchen atenolol (TENORMIN) 50 MG tablet Take 1 tablet (50 mg total) by mouth 2 (two) times daily.  . carbamazepine (TEGRETOL) 200 MG tablet Take one and one-half tablets (300 mg total) by mouth 2 (two) times daily.  . cyclobenzaprine (FLEXERIL) 10 MG tablet Take 1 tablet (10 mg total) by mouth 3 (three) times daily as needed for muscle spasms.  Marland Kitchen  escitalopram (LEXAPRO) 20 MG tablet Take 20 mg by mouth daily.   . ferrous sulfate 325 (65 FE) MG tablet Take 1 tablet (325 mg total) by mouth 2 (two) times daily with a meal.  . hyoscyamine (LEVSIN, ANASPAZ) 0.125 MG tablet Take 1 tablet (0.125 mg total) by mouth 4 (four) times daily as needed for cramping.  . mirtazapine (REMERON) 30 MG tablet Take 30 mg by mouth at bedtime.   Marland Kitchen omeprazole (PRILOSEC) 40 MG capsule Take 1 capsule (40 mg total) by mouth 2 (two) times daily.  . ondansetron (ZOFRAN) 8 MG tablet Take 1 tablet (8 mg total) by mouth as needed for nausea or vomiting.  . traZODone (DESYREL) 50 MG tablet Take 50 mg by mouth at bedtime.  . levETIRAcetam (KEPPRA) 500 MG tablet Take 500 mg by mouth 2 (two) times daily.   No facility-administered encounter medications on file as of 10/07/2018.     Surgical History: Past Surgical History:  Procedure Laterality Date  . ABDOMINAL HYSTERECTOMY    . CESAREAN SECTION    . COLONOSCOPY N/A 08/31/2014   Procedure: COLONOSCOPY;  Surgeon: Lucilla Lame, MD;  Location: [place];  Service: Gastroenterology;  Laterality: N/A;  . COLONOSCOPY N/A 08/05/2016   Procedure: COLONOSCOPY;  Surgeon: [name], MD;  Location: [facility] ENDOSCOPY;  Service: Endoscopy;  Laterality: N/A;  . CYST EXCISION  2011   from throat  . ESOPHAGOGASTRODUODENOSCOPY N/A 08/31/2014   Procedure: ESOPHAGOGASTRODUODENOSCOPY (EGD);  Surgeon: Lucilla Lame, MD;  Location: [place];  Service: Gastroenterology;  Laterality: N/A;  . ESOPHAGOGASTRODUODENOSCOPY (EGD) WITH PROPOFOL N/A 07/01/2017   Procedure: ESOPHAGOGASTRODUODENOSCOPY (EGD) WITH PROPOFOL;  Surgeon: [name], MD;  Location: [facility] ENDOSCOPY;  Service: Gastroenterology;  Laterality: N/A;  . FLEXIBLE SIGMOIDOSCOPY N/A 07/01/2017   Procedure: FLEXIBLE SIGMOIDOSCOPY;  Surgeon: [name], MD;  Location: [facility] ENDOSCOPY;  Service: Gastroenterology;  Laterality: N/A;  . HEMORRHOID SURGERY    . INCISION AND DRAINAGE  HIP Right 11/25/2014   Procedure: IRRIGATION  AND DRAINAGEOF RIGHT HIP WITH PLACEMENT OF ANTIBIOUTIC BEADS;  Surgeon: Mcarthur Rossetti, MD;  Location: [place];  Service: Orthopedics;  Laterality: Right;  . INCISION AND DRAINAGE HIP Right 11/28/2014   Procedure: Repeat I&D Right Hip;  Surgeon: Mcarthur Rossetti, MD;  Location: [place];  Service: Orthopedics;  Laterality: Right;  . JOINT REPLACEMENT Right 2007   hip  . TOTAL HIP REVISION Right 03/19/2015   Procedure: RIGHT TOTAL HIP ARTHROPLASTY REVISION;  Surgeon: Meredith Pel, MD;  Location: [place];  Service: Orthopedics;  Laterality: Right;  . TOTAL HIP REVISION Right 11/18/2015   Procedure: TOTAL HIP REVISION;  Surgeon: Frederik Pear, MD;  Location: [place];  Service: Orthopedics;  Laterality: Right;    Medical History: Past Medical History:  Diagnosis Date  . Anemia   . Anxiety   . Arthritis   . Barrett esophagus   . Cancer ([place]) 2016    Skin cancer right leg mylenoma  . Depression   . Diabetes ([place]) 04/25/2017  . GERD (gastroesophageal reflux disease)   . History of blood transfusion    with a surgery  . History of bronchitis   . History of pneumonia   . Hypertension   . IBS (irritable bowel syndrome)   . Pneumonia 09/27/15  . PTSD (post-traumatic stress disorder)   . Renal insufficiency    reports acute kidney injury  . Seizures ([place])    rare - last one 07/30/14 - was very anemic.  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 Vertigo    thinks related to sinus issues    Family History: Family History  Problem Relation Age of Onset  . Alcoholism Father   . Throat cancer Mother     Social History   Socioeconomic History  . Marital status: Single    Spouse name: Not on file  . Number of children: Not on file  . Years of education: Not on file  . Highest education level: Not on file  Occupational History  . Not on file  Social Needs  . Financial resource strain: Not on file  . Food insecurity    Worry: Not on file     Inability: Not on file  . Transportation needs    Medical: Not on file    Non-medical: Not on file  Tobacco Use  . Smoking status: Never Smoker  . Smokeless tobacco: Never Used  Substance and Sexual Activity  . Alcohol use: No    Alcohol/week: 0.0 standard drinks  . Drug use: No  . Sexual activity: Not Currently  Lifestyle  . Physical activity    Days per week: Not on file    Minutes per session: Not on file  . Stress: Not on file  Relationships  . Social Herbalist on phone: Not on file    Gets together: Not on file    Attends religious service: Not on file    Active member of club or organization: Not on file  Attends meetings of clubs or organizations: Not on file    Relationship status: Not on file  . Intimate partner violence    Fear of current or ex partner: Not on file    Emotionally abused: Not on file    Physically abused: Not on file    Forced sexual activity: Not on file  Other Topics Concern  . Not on file  Social History Narrative  . Not on file      Review of Systems  Constitutional: Negative for activity change, chills, fatigue and unexpected weight change.  HENT: Negative for congestion, postnasal drip, rhinorrhea, sneezing and sore throat.   Respiratory: Negative for cough, chest tightness and shortness of breath.   Cardiovascular: Negative for chest pain and palpitations.       Elevated blood pressures. Generally elevated when arrives in the office. Will come down after several minutes .  Gastrointestinal: Negative for abdominal pain, constipation, diarrhea, nausea and vomiting.  Endocrine: Negative for cold intolerance, heat intolerance, polydipsia and polyuria.       Thyroid panel abnormal when labs were checked in 03/2018.   Musculoskeletal: Positive for back pain and myalgias. Negative for arthralgias, joint swelling and neck pain.       Chronic back pain, radiates to both hips and down the upper legs .  Skin: Negative for rash.   Allergic/Immunologic: Negative for environmental allergies.  Neurological: Positive for seizures and numbness. Negative for tremors.       Well managed on current medications.   Hematological: Negative for adenopathy. Does not bruise/bleed easily.  Psychiatric/Behavioral: Negative for behavioral problems (Depression), sleep disturbance and suicidal ideas. The patient is not nervous/anxious.     Today's Vitals   10/07/18 1224  Resp: 16  Weight: 190 lb (86.2 kg)  Height: 5\' 1"  (1.549 m)   Body mass index is 35.9 kg/m.  Observation/Objective:   The patient is alert and oriented. She is pleasant and answers all questions appropriately. Breathing is non-labored. She is in no acute distress at this time.    Assessment/Plan:  1. Essential hypertension Stable. Continue atenolol as prescribed   2. 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 New rx provided today.  - cyclobenzaprine (FLEXERIL) 10 MG tablet; Take 1 tablet (10 mg total) by mouth 3 (three) times daily as needed for muscle spasms.  Dispense: 90 tablet; Refill: 3  3. Anxiety disorder, unspecified type Continue regular visits with psychiatry as scheduled  General Counseling: ivee poellnitz understanding of the findings of today's phone visit and agrees with plan of treatment. I have discussed any further diagnostic evaluation that may be needed or ordered today. We also reviewed her medications today. she has been encouraged to call the office with any questions or concerns that should arise related to todays visit.  This patient was seen by Leretha Pol FNP Collaboration with Dr [name] as a part of collaborative care agreement  Time spent: 25 Minutes    Dr [name] Internal medicine

## 2018-10-19 ENCOUNTER — Other Ambulatory Visit: Payer: Self-pay | Admitting: Gastroenterology

## 2018-10-20 ENCOUNTER — Ambulatory Visit: Payer: Self-pay | Admitting: Nurse Practitioner

## 2018-10-21 ENCOUNTER — Ambulatory Visit: Payer: Self-pay | Admitting: Nurse Practitioner

## 2018-10-25 ENCOUNTER — Telehealth: Payer: Self-pay

## 2018-10-25 ENCOUNTER — Other Ambulatory Visit: Payer: Self-pay | Admitting: Internal Medicine

## 2018-10-25 ENCOUNTER — Other Ambulatory Visit: Payer: Self-pay

## 2018-10-25 MED ORDER — OMEPRAZOLE 40 MG PO CPDR: 40.0000 mg | DELAYED_RELEASE_CAPSULE | Freq: Two times a day (BID) | ORAL | 1 refills | Status: DC

## 2018-10-25 MED ORDER — ATENOLOL 50 MG PO TABS: 50.0000 mg | ORAL_TABLET | Freq: Two times a day (BID) | ORAL | 5 refills | Status: DC

## 2018-10-25 NOTE — Progress Notes (Signed)
ERROR

## 2018-10-25 NOTE — Telephone Encounter (Signed)
Pt contacted our office to request a refill on omeprazole which has been refill by her PCP. I returned pt call and explained to pt that she has not seen Dr. Vicente Males since February 2019, when she was seen for Iron deficiency anemia, and was to follow up after 3 months. I offered pt an appointment to follow up with Dr. Vicente Males but pt declines stating she doesn't have reliable transportation. Pt states her PCP has been following her iron levels.

## 2018-10-27 ENCOUNTER — Other Ambulatory Visit: Payer: Self-pay | Admitting: Internal Medicine

## 2018-10-27 DIAGNOSIS — D6489 Other specified anemias: Secondary | ICD-10-CM

## 2018-10-27 NOTE — Telephone Encounter (Signed)
Christina French inform patient - noted low Hb if she wishes to pursue evaluation will need to be seen at the office. She did not go through the capsule study as suggested previously .    C.c [name], MD   Dr [name] MD,MRCP [facility]) Gastroenterology/Hepatology Pager: [phone]

## 2018-10-27 NOTE — Telephone Encounter (Signed)
Done by HB, however her hb was 9.0. no iron studies were done in the last 6 months by [place], will send an order to do so

## 2018-10-27 NOTE — Telephone Encounter (Signed)
Called patient to let her know to have labs done in six months

## 2018-10-27 NOTE — Progress Notes (Signed)
Order sent.

## 2018-10-27 NOTE — Telephone Encounter (Addendum)
Spoke with pt and informed her of Dr. Georgeann Oppenheim recommendations. Pt declines and states that she hasn't had any symptoms that she feels would require a GI follow up at this time. Pt states she's just going to allow her PCP to monitor her iron levels. Pt also declined the Capsule Study. Pt states she'll contact our office if she feels she needs to see Dr. Vicente Males.

## 2018-12-08 ENCOUNTER — Other Ambulatory Visit
Admission: RE | Admit: 2018-12-08 | Discharge: 2018-12-08 | Disposition: A | Discharge status: Home / Self Care | Payer: Medicare Other | Source: Ambulatory Visit | Attending: Internal Medicine | Admitting: Internal Medicine

## 2018-12-20 ENCOUNTER — Other Ambulatory Visit: Payer: Self-pay

## 2018-12-20 ENCOUNTER — Inpatient Hospital Stay
Admission: EM | Admit: 2018-12-20 | Discharge: 2018-12-23 | DRG: 378 | Disposition: A | Discharge status: Home / Self Care | Payer: Medicare Other | Attending: Internal Medicine | Admitting: Internal Medicine

## 2018-12-20 ENCOUNTER — Encounter: Payer: Self-pay | Admitting: Emergency Medicine

## 2018-12-20 DIAGNOSIS — K921 Melena: Principal | ICD-10-CM | POA: Diagnosis present

## 2018-12-20 DIAGNOSIS — F419 Anxiety disorder, unspecified: Secondary | ICD-10-CM | POA: Diagnosis present

## 2018-12-20 DIAGNOSIS — Z03818 Encounter for observation for suspected exposure to other biological agents ruled out: Secondary | ICD-10-CM | POA: Diagnosis not present

## 2018-12-20 DIAGNOSIS — Z808 Family history of malignant neoplasm of other organs or systems: Secondary | ICD-10-CM | POA: Diagnosis not present

## 2018-12-20 DIAGNOSIS — M1990 Unspecified osteoarthritis, unspecified site: Secondary | ICD-10-CM | POA: Diagnosis present

## 2018-12-20 DIAGNOSIS — E222 Syndrome of inappropriate secretion of antidiuretic hormone: Secondary | ICD-10-CM | POA: Diagnosis present

## 2018-12-20 DIAGNOSIS — D649 Anemia, unspecified: Secondary | ICD-10-CM | POA: Diagnosis not present

## 2018-12-20 DIAGNOSIS — E861 Hypovolemia: Secondary | ICD-10-CM | POA: Diagnosis not present

## 2018-12-20 DIAGNOSIS — Z96641 Presence of right artificial hip joint: Secondary | ICD-10-CM | POA: Diagnosis not present

## 2018-12-20 DIAGNOSIS — K589 Irritable bowel syndrome without diarrhea: Secondary | ICD-10-CM | POA: Diagnosis not present

## 2018-12-20 DIAGNOSIS — Z20828 Contact with and (suspected) exposure to other viral communicable diseases: Secondary | ICD-10-CM | POA: Diagnosis present

## 2018-12-20 DIAGNOSIS — Z85828 Personal history of other malignant neoplasm of skin: Secondary | ICD-10-CM | POA: Diagnosis not present

## 2018-12-20 DIAGNOSIS — E876 Hypokalemia: Secondary | ICD-10-CM | POA: Diagnosis not present

## 2018-12-20 DIAGNOSIS — D509 Iron deficiency anemia, unspecified: Secondary | ICD-10-CM | POA: Diagnosis not present

## 2018-12-20 DIAGNOSIS — E119 Type 2 diabetes mellitus without complications: Secondary | ICD-10-CM | POA: Diagnosis present

## 2018-12-20 DIAGNOSIS — T421X5A Adverse effect of iminostilbenes, initial encounter: Secondary | ICD-10-CM | POA: Diagnosis not present

## 2018-12-20 DIAGNOSIS — G40909 Epilepsy, unspecified, not intractable, without status epilepticus: Secondary | ICD-10-CM | POA: Diagnosis present

## 2018-12-20 DIAGNOSIS — E871 Hypo-osmolality and hyponatremia: Secondary | ICD-10-CM | POA: Diagnosis not present

## 2018-12-20 DIAGNOSIS — K648 Other hemorrhoids: Secondary | ICD-10-CM | POA: Diagnosis present

## 2018-12-20 DIAGNOSIS — K219 Gastro-esophageal reflux disease without esophagitis: Secondary | ICD-10-CM | POA: Diagnosis present

## 2018-12-20 DIAGNOSIS — K317 Polyp of stomach and duodenum: Secondary | ICD-10-CM | POA: Diagnosis present

## 2018-12-20 DIAGNOSIS — I10 Essential (primary) hypertension: Secondary | ICD-10-CM | POA: Diagnosis not present

## 2018-12-20 DIAGNOSIS — K625 Hemorrhage of anus and rectum: Secondary | ICD-10-CM | POA: Diagnosis not present

## 2018-12-20 DIAGNOSIS — Z811 Family history of alcohol abuse and dependence: Secondary | ICD-10-CM | POA: Diagnosis not present

## 2018-12-20 DIAGNOSIS — F4310 Post-traumatic stress disorder, unspecified: Secondary | ICD-10-CM | POA: Diagnosis present

## 2018-12-20 DIAGNOSIS — K2270 Barrett's esophagus without dysplasia: Secondary | ICD-10-CM | POA: Diagnosis present

## 2018-12-20 DIAGNOSIS — K649 Unspecified hemorrhoids: Secondary | ICD-10-CM | POA: Diagnosis not present

## 2018-12-20 LAB — CBC
HCT: 31.7 % — ABNORMAL LOW (ref 36.0–46.0)
Hemoglobin: 10.7 g/dL — ABNORMAL LOW (ref 12.0–15.0)
MCH: 25 pg — ABNORMAL LOW (ref 26.0–34.0)
MCHC: 33.8 g/dL (ref 30.0–36.0)
MCV: 74.1 fL — ABNORMAL LOW (ref 80.0–100.0)
Platelets: 174 10*3/uL (ref 150–400)
RBC: 4.28 MIL/uL (ref 3.87–5.11)
RDW: 15.9 % — ABNORMAL HIGH (ref 11.5–15.5)
WBC: 8.4 10*3/uL (ref 4.0–10.5)
nRBC: 0 % (ref 0.0–0.2)

## 2018-12-20 LAB — URINALYSIS, COMPLETE (UACMP) WITH MICROSCOPIC
Bilirubin Urine: NEGATIVE
Glucose, UA: NEGATIVE mg/dL
Ketones, ur: NEGATIVE mg/dL
Leukocytes,Ua: NEGATIVE
Nitrite: NEGATIVE
Protein, ur: NEGATIVE mg/dL
RBC / HPF: >50 RBC/hpf — ABNORMAL HIGH (ref 0–5)
Specific Gravity, Urine: 1.005 (ref 1.005–1.030)
pH: 7 (ref 5.0–8.0)

## 2018-12-20 LAB — COMPREHENSIVE METABOLIC PANEL
ALT: 17 U/L (ref 0–44)
AST: 23 U/L (ref 15–41)
Albumin: 4.8 g/dL (ref 3.5–5.0)
Alkaline Phosphatase: 94 U/L (ref 38–126)
Anion gap: 13 (ref 5–15)
BUN: 10 mg/dL (ref 8–23)
CO2: 24 mmol/L (ref 22–32)
Calcium: 9.6 mg/dL (ref 8.9–10.3)
Chloride: 83 mmol/L — ABNORMAL LOW (ref 98–111)
Creatinine, Ser: 0.68 mg/dL (ref 0.44–1.00)
GFR calc Af Amer: >60 mL/min (ref >60)
GFR calc non Af Amer: >60 mL/min (ref >60)
Glucose, Bld: 154 mg/dL — ABNORMAL HIGH (ref 70–99)
Potassium: 3.5 mmol/L (ref 3.5–5.1)
Sodium: 120 mmol/L — ABNORMAL LOW (ref 135–145)
Total Bilirubin: 0.6 mg/dL (ref 0.3–1.2)
Total Protein: 7.6 g/dL (ref 6.5–8.1)

## 2018-12-20 LAB — HEMOGLOBIN AND HEMATOCRIT, BLOOD
HCT: 29.9 % — ABNORMAL LOW (ref 36.0–46.0)
HCT: 31.3 % — ABNORMAL LOW (ref 36.0–46.0)
Hemoglobin: 10 g/dL — ABNORMAL LOW (ref 12.0–15.0)
Hemoglobin: 10.6 g/dL — ABNORMAL LOW (ref 12.0–15.0)

## 2018-12-20 LAB — TSH: TSH: 3.937 u[IU]/mL (ref 0.350–4.500)

## 2018-12-20 LAB — IRON AND TIBC
Iron: 30 ug/dL (ref 28–170)
Saturation Ratios: 7 % — ABNORMAL LOW (ref 10.4–31.8)
TIBC: 453 ug/dL — ABNORMAL HIGH (ref 250–450)
UIBC: 423 ug/dL

## 2018-12-20 LAB — FERRITIN: Ferritin: 9 ng/mL — ABNORMAL LOW (ref 11–307)

## 2018-12-20 LAB — SODIUM
Sodium: 125 mmol/L — ABNORMAL LOW (ref 135–145)
Sodium: 126 mmol/L — ABNORMAL LOW (ref 135–145)

## 2018-12-20 LAB — SARS CORONAVIRUS 2 (TAT 6-24 HRS): SARS Coronavirus 2: NEGATIVE

## 2018-12-20 LAB — VALPROIC ACID LEVEL: Valproic Acid Lvl: <10 ug/mL — ABNORMAL LOW (ref 50.0–100.0)

## 2018-12-20 MED ORDER — PANTOPRAZOLE SODIUM 40 MG IV SOLR
40.0000 mg | Freq: Two times a day (BID) | INTRAVENOUS | Status: DC
  Administered 2018-12-20 – 2018-12-23 (×7): 40 mg via INTRAVENOUS
  Filled 2018-12-20 (×7): qty 40

## 2018-12-20 MED ORDER — LEVETIRACETAM 500 MG PO TABS
500.0000 mg | ORAL_TABLET | Freq: Two times a day (BID) | ORAL | Status: DC
  Administered 2018-12-20 – 2018-12-23 (×7): 500 mg via ORAL
  Filled 2018-12-20 (×7): qty 1

## 2018-12-20 MED ORDER — ACETAMINOPHEN 325 MG PO TABS
650.0000 mg | ORAL_TABLET | Freq: Four times a day (QID) | ORAL | Status: DC | PRN
  Administered 2018-12-21: 650 mg via ORAL
  Filled 2018-12-20: qty 2

## 2018-12-20 MED ORDER — CYCLOBENZAPRINE HCL 10 MG PO TABS
10.0000 mg | ORAL_TABLET | Freq: Three times a day (TID) | ORAL | Status: DC | PRN
  Administered 2018-12-21: 03:00:00 10 mg via ORAL
  Filled 2018-12-20: qty 1

## 2018-12-20 MED ORDER — TRAZODONE HCL 50 MG PO TABS
50.0000 mg | ORAL_TABLET | Freq: Every day | ORAL | Status: DC
  Administered 2018-12-20 – 2018-12-22 (×3): 50 mg via ORAL
  Filled 2018-12-20 (×3): qty 1

## 2018-12-20 MED ORDER — SODIUM CHLORIDE 0.9 % IV BOLUS
1000.0000 mL | Freq: Once | INTRAVENOUS | Status: AC
  Administered 2018-12-20: 1000 mL via INTRAVENOUS

## 2018-12-20 MED ORDER — DOCUSATE SODIUM 100 MG PO CAPS
100.0000 mg | ORAL_CAPSULE | Freq: Two times a day (BID) | ORAL | Status: DC
  Administered 2018-12-20 – 2018-12-23 (×6): 100 mg via ORAL
  Filled 2018-12-20 (×7): qty 1

## 2018-12-20 MED ORDER — HYDRALAZINE HCL 20 MG/ML IJ SOLN
10.0000 mg | Freq: Four times a day (QID) | INTRAMUSCULAR | Status: DC | PRN
  Administered 2018-12-21 – 2018-12-22 (×2): 10 mg via INTRAVENOUS
  Filled 2018-12-20 (×2): qty 1

## 2018-12-20 MED ORDER — SODIUM CHLORIDE 0.9 % IV SOLN
INTRAVENOUS | Status: DC
  Administered 2018-12-20 – 2018-12-22 (×8): via INTRAVENOUS

## 2018-12-20 MED ORDER — ALPRAZOLAM 0.5 MG PO TABS
1.0000 mg | ORAL_TABLET | Freq: Three times a day (TID) | ORAL | Status: DC | PRN
  Administered 2018-12-20 – 2018-12-22 (×6): 1 mg via ORAL
  Filled 2018-12-20 (×5): qty 2
  Filled 2018-12-20: qty 4
  Filled 2018-12-20: qty 2

## 2018-12-20 MED ORDER — CARBAMAZEPINE 200 MG PO TABS
300.0000 mg | ORAL_TABLET | Freq: Two times a day (BID) | ORAL | Status: DC
  Administered 2018-12-20 – 2018-12-23 (×7): 300 mg via ORAL
  Filled 2018-12-20 (×8): qty 1.5

## 2018-12-20 MED ORDER — PEG 3350-KCL-NA BICARB-NACL 420 G PO SOLR
4000.0000 mL | Freq: Once | ORAL | Status: AC
  Administered 2018-12-20: 4000 mL via ORAL
  Filled 2018-12-20: qty 4000

## 2018-12-20 MED ORDER — ONDANSETRON HCL 4 MG PO TABS: 4.0000 mg | ORAL_TABLET | Freq: Four times a day (QID) | ORAL | Status: DC | PRN

## 2018-12-20 MED ORDER — SODIUM CHLORIDE 0.9 % IV SOLN
200.0000 mg | Freq: Once | INTRAVENOUS | Status: AC
  Administered 2018-12-20: 18:00:00 200 mg via INTRAVENOUS
  Filled 2018-12-20: qty 10

## 2018-12-20 MED ORDER — ESCITALOPRAM OXALATE 10 MG PO TABS
20.0000 mg | ORAL_TABLET | Freq: Every day | ORAL | Status: DC
  Administered 2018-12-20 – 2018-12-23 (×4): 20 mg via ORAL
  Filled 2018-12-20 (×4): qty 2

## 2018-12-20 MED ORDER — ATENOLOL 50 MG PO TABS
50.0000 mg | ORAL_TABLET | Freq: Two times a day (BID) | ORAL | Status: DC
  Administered 2018-12-20 – 2018-12-23 (×7): 50 mg via ORAL
  Filled 2018-12-20 (×2): qty 1
  Filled 2018-12-20 (×2): qty 2
  Filled 2018-12-20 (×2): qty 1
  Filled 2018-12-20: qty 2
  Filled 2018-12-20 (×2): qty 1

## 2018-12-20 MED ORDER — ONDANSETRON HCL 4 MG/2ML IJ SOLN
4.0000 mg | Freq: Four times a day (QID) | INTRAMUSCULAR | Status: DC | PRN
  Administered 2018-12-20: 4 mg via INTRAVENOUS
  Filled 2018-12-20: qty 2

## 2018-12-20 MED ORDER — ONDANSETRON HCL 4 MG/2ML IJ SOLN
4.0000 mg | Freq: Once | INTRAMUSCULAR | Status: AC
  Administered 2018-12-20: 4 mg via INTRAVENOUS
  Filled 2018-12-20: qty 2

## 2018-12-20 MED ORDER — ACETAMINOPHEN 650 MG RE SUPP: 650.0000 mg | Freq: Four times a day (QID) | RECTAL | Status: DC | PRN

## 2018-12-20 MED ORDER — BISACODYL 5 MG PO TBEC
10.0000 mg | DELAYED_RELEASE_TABLET | Freq: Once | ORAL | Status: AC
  Administered 2018-12-20: 10 mg via ORAL
  Filled 2018-12-20: qty 2

## 2018-12-20 MED ORDER — MIRTAZAPINE 15 MG PO TABS
30.0000 mg | ORAL_TABLET | Freq: Every day | ORAL | Status: DC
  Administered 2018-12-20 – 2018-12-22 (×3): 30 mg via ORAL
  Filled 2018-12-20 (×3): qty 2

## 2018-12-20 MED ORDER — PANTOPRAZOLE SODIUM 40 MG IV SOLR
40.0000 mg | Freq: Once | INTRAVENOUS | Status: AC
  Administered 2018-12-20: 05:00:00 40 mg via INTRAVENOUS
  Filled 2018-12-20: qty 40

## 2018-12-20 NOTE — ED Notes (Signed)
Pt resting in bed, waiting for bed assignment.

## 2018-12-20 NOTE — Progress Notes (Signed)
Called Dr. Vicente Males to inform him that the patient cannot tolerate the golytely bowel prep due to nausea and vomiting.  Gave patient zofran. Will continue to monitor.  Christene Slates 12/20/2018 11:59 PM

## 2018-12-20 NOTE — ED Notes (Signed)
ED TO INPATIENT HANDOFF REPORT  ED Nurse Name and Phone #: bill [phone]  S Name/Age/Gender Christina French 63 y.o. female Room/Bed: ED03A/ED03A  Code Status   Code Status: Prior  Home/SNF/Other Home Patient oriented to: self, place, time and situation Is this baseline? Yes   Triage Complete: Triage complete  Chief Complaint Rectal bleeding  Triage Note Patient to ER for c/o rectal bleeding ("that's been going on for a while, but is getting worse"). Patient also reports very high BP at home and numbness/tingling to left arm ("that has been that way a long time - 3 weeks ago). Patient states bleeding has been severe enough that she is having to wear pad and is bright red blood. Patient reports history of the same in the past.     Allergies Allergies  Allergen Reactions  . No Known Allergies     Level of Care/Admitting Diagnosis ED Disposition    ED Disposition Condition [facility] Area: [place] [[id number]]  Level of Care: Med-Surg [16]  Covid Evaluation: Asymptomatic Screening Protocol (No Symptoms)  Diagnosis: Hematochezia [id number]  Admitting Physician: Harrie Foreman [id number]  Attending Physician: Harrie Foreman [id number]  Estimated length of stay: past midnight tomorrow  Certification:: I certify this patient will need inpatient services for at least 2 midnights  PT Class (Do Not Modify): Inpatient [101]  PT Acc Code (Do Not Modify): Private [1]       B Medical/Surgery History Past Medical History:  Diagnosis Date  . Anemia   . Anxiety   . Arthritis   . Barrett esophagus   . Cancer ([place]) 2016    Skin cancer right leg mylenoma  . Depression   . Diabetes ([place]) 04/25/2017  . GERD (gastroesophageal reflux disease)   . History of blood transfusion    with a surgery  . History of bronchitis   . History of pneumonia   . Hypertension   . IBS (irritable bowel syndrome)   . Pneumonia 09/27/15  . PTSD (post-traumatic  stress disorder)   . Renal insufficiency    reports acute kidney injury  . Seizures ([place])    rare - last one 07/30/14 - was very anemic.  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 Vertigo    thinks related to sinus issues   Past Surgical History:  Procedure Laterality Date  . ABDOMINAL HYSTERECTOMY    . CESAREAN SECTION    . COLONOSCOPY N/A 08/31/2014   Procedure: COLONOSCOPY;  Surgeon: Lucilla Lame, MD;  Location: [place];  Service: Gastroenterology;  Laterality: N/A;  . COLONOSCOPY N/A 08/05/2016   Procedure: COLONOSCOPY;  Surgeon: [name], MD;  Location: [facility] ENDOSCOPY;  Service: Endoscopy;  Laterality: N/A;  . CYST EXCISION  2011   from throat  . ESOPHAGOGASTRODUODENOSCOPY N/A 08/31/2014   Procedure: ESOPHAGOGASTRODUODENOSCOPY (EGD);  Surgeon: Lucilla Lame, MD;  Location: [place];  Service: Gastroenterology;  Laterality: N/A;  . ESOPHAGOGASTRODUODENOSCOPY (EGD) WITH PROPOFOL N/A 07/01/2017   Procedure: ESOPHAGOGASTRODUODENOSCOPY (EGD) WITH PROPOFOL;  Surgeon: [name], MD;  Location: [facility] ENDOSCOPY;  Service: Gastroenterology;  Laterality: N/A;  . FLEXIBLE SIGMOIDOSCOPY N/A 07/01/2017   Procedure: FLEXIBLE SIGMOIDOSCOPY;  Surgeon: [name], MD;  Location: [facility] ENDOSCOPY;  Service: Gastroenterology;  Laterality: N/A;  . HEMORRHOID SURGERY    . INCISION AND DRAINAGE HIP Right 11/25/2014   Procedure: IRRIGATION  AND DRAINAGEOF RIGHT HIP WITH PLACEMENT OF ANTIBIOUTIC BEADS;  Surgeon: Mcarthur Rossetti, MD;  Location: [place];  Service: Orthopedics;  Laterality: Right;  . INCISION AND DRAINAGE HIP Right 11/28/2014   Procedure: Repeat I&D Right Hip;  Surgeon: Mcarthur Rossetti, MD;  Location: [place];  Service: Orthopedics;  Laterality: Right;  . JOINT REPLACEMENT Right 2007   hip  . TOTAL HIP REVISION Right 03/19/2015   Procedure: RIGHT TOTAL HIP ARTHROPLASTY REVISION;  Surgeon: Meredith Pel, MD;  Location: [place];  Service: Orthopedics;  Laterality: Right;  . TOTAL  HIP REVISION Right 11/18/2015   Procedure: TOTAL HIP REVISION;  Surgeon: Frederik Pear, MD;  Location: [place];  Service: Orthopedics;  Laterality: Right;     A IV Location/Drains/Wounds Patient Lines/Drains/Airways Status   Active Line/Drains/Airways    Name:   Placement date:   Placement time:   Site:   Days:   Peripheral IV 12/20/18 Right Antecubital   12/20/18    0245    Antecubital   less than 1   Airway   07/01/17    0905     537          Intake/Output Last 24 hours No intake or output data in the 24 hours ending 12/20/18 [id number]  Labs/Imaging Results for orders placed or performed during the hospital encounter of 12/20/18 (from the past 48 hour(s))  Comprehensive metabolic panel     Status: Abnormal   Collection Time: 12/20/18  2:40 AM  Result Value Ref Range   Sodium 120 (L) 135 - 145 mmol/L   Potassium 3.5 3.5 - 5.1 mmol/L   Chloride 83 (L) 98 - 111 mmol/L   CO2 24 22 - 32 mmol/L   Glucose, Bld 154 (H) 70 - 99 mg/dL   BUN 10 8 - 23 mg/dL   Creatinine, Ser 0.68 0.44 - 1.00 mg/dL   Calcium 9.6 8.9 - 10.3 mg/dL   Total Protein 7.6 6.5 - 8.1 g/dL   Albumin 4.8 3.5 - 5.0 g/dL   AST 23 15 - 41 U/L   ALT 17 0 - 44 U/L   Alkaline Phosphatase 94 38 - 126 U/L   Total Bilirubin 0.6 0.3 - 1.2 mg/dL   GFR calc non Af Amer >60 >60 mL/min   GFR calc Af Amer >60 >60 mL/min   Anion gap 13 5 - 15    Comment: Performed at [facility], [address] [place], [place] [address]  CBC     Status: Abnormal   Collection Time: 12/20/18  2:40 AM  Result Value Ref Range   WBC 8.4 4.0 - 10.5 K/uL   RBC 4.28 3.87 - 5.11 MIL/uL   Hemoglobin 10.7 (L) 12.0 - 15.0 g/dL   HCT 31.7 (L) 36.0 - 46.0 %   MCV 74.1 (L) 80.0 - 100.0 fL   MCH 25.0 (L) 26.0 - 34.0 pg   MCHC 33.8 30.0 - 36.0 g/dL   RDW 15.9 (H) 11.5 - 15.5 %   Platelets 174 150 - 400 K/uL   nRBC 0.0 0.0 - 0.2 %    Comment: Performed at [facility], [address] [place], [place] [address]  Type and screen  [place]     Status: None (Preliminary result)   Collection Time: 12/20/18  2:40 AM  Result Value Ref Range   ABO/RH(D) O NEG    Antibody Screen POS    Sample Expiration 12/23/2018,2359    Antibody Identification      ANTI C ANTI K Performed at [facility], [address] [place], [place] [address]  Unit Number [id number]    Blood Component Type RED CELLS,LR    Unit division 00    Status of Unit ALLOCATED    Transfusion Status OK TO TRANSFUSE    Crossmatch Result COMPATIBLE    Unit Number [id number]    Blood Component Type RBC LR PHER1    Unit division 00    Status of Unit ALLOCATED    Transfusion Status OK TO TRANSFUSE    Crossmatch Result COMPATIBLE   Valproic acid level     Status: Abnormal   Collection Time: 12/20/18  2:40 AM  Result Value Ref Range   Valproic Acid Lvl <10 (L) 50.0 - 100.0 ug/mL    Comment: Performed at [facility], [address] [place], [place] [address]  Urinalysis, Complete w Microscopic     Status: Abnormal   Collection Time: 12/20/18  3:01 AM  Result Value Ref Range   Color, Urine YELLOW (A) YELLOW   APPearance CLEAR (A) CLEAR   Specific Gravity, Urine 1.005 1.005 - 1.030   pH 7.0 5.0 - 8.0   Glucose, UA NEGATIVE NEGATIVE mg/dL   Hgb urine dipstick LARGE (A) NEGATIVE   Bilirubin Urine NEGATIVE NEGATIVE   Ketones, ur NEGATIVE NEGATIVE mg/dL   Protein, ur NEGATIVE NEGATIVE mg/dL   Nitrite NEGATIVE NEGATIVE   Leukocytes,Ua NEGATIVE NEGATIVE   RBC / HPF >50 (H) 0 - 5 RBC/hpf   WBC, UA 0-5 0 - 5 WBC/hpf   Bacteria, UA RARE (A) NONE SEEN   Squamous Epithelial / LPF 0-5 0 - 5    Comment: Performed at [facility], [address] [place], [place] [address]   No results found.  Pending Labs [employer] (From admission, onward)    Start     Ordered   12/20/18 0440  SARS CORONAVIRUS 2 (TAT 6-12 HRS) Nasal Swab Aptima Multi Swab  (Asymptomatic/Tier 2 Patients Labs)  Once,   STAT     Question Answer Comment  Is this test for diagnosis or screening Screening   Symptomatic for COVID-19 as defined by CDC No   Hospitalized for COVID-19 No   Admitted to ICU for COVID-19 No   Previously tested for COVID-19 No   Resident in a congregate (group) care setting No   Employed in healthcare setting No   Pregnant No      12/20/18 0439   Signed and Held  TSH  Add-on,   R     Signed and Held          Vitals/Pain Today's Vitals   12/20/18 0234 12/20/18 0530 12/20/18 0600 12/20/18 0630  BP: (!) 165/89 (!) 171/95 (!) 132/117 (!) 155/101  Pulse: 84 70 70 70  Resp: 20     Temp: 98.3 F (36.8 C)     TempSrc: Oral     SpO2: 98% 98% 97% 100%  Weight: 90.7 kg     Height: 5\' 1"  (1.549 m)     PainSc: 6        Isolation Precautions No active isolations  Medications Medications  sodium chloride 0.9 % bolus 1,000 mL (1,000 mLs Intravenous New Bag/Given 12/20/18 0531)  pantoprazole (PROTONIX) injection 40 mg (40 mg Intravenous Given 12/20/18 0521)  ondansetron (ZOFRAN) injection 4 mg (4 mg Intravenous Given 12/20/18 0527)    Mobility walks Low fall risk   Focused Assessments other   R Recommendations: See Admitting Provider Note  Report given to:   Additional Notes:

## 2018-12-20 NOTE — Progress Notes (Signed)
Advanced care plan. Purpose of the Encounter: CODE STATUS Parties in Attendance: Patient Patient's Decision Capacity: Good Subjective/Patient's story: The patient with past medical history of seizure disorder, GI bleed in the past, depression and diabetes presents to the emergency department on the insistence of her daughter who found the patient to have blood in her seat this evening.  The patient denies abdominal pain.  She was stable throughout emergency department evaluation.  She is given pantoprazole IV prior to the emergency department staff, hospitalist service for admission. Objective/Medical story Patient needs GI evaluation Monitor hemoglobin hematocrit closely and PRBC transfusion if needed Needs IV proton pump inhibitor Goals of care determination:  Advance care directives goals of care treatment plan discussed No patient wants everything done which includes CPR, intubation ventilator if the need arises CODE STATUS: Full code Time spent discussing advanced care planning: 16 minutes

## 2018-12-20 NOTE — H&P (Addendum)
Christina French is an 63 y.o. female.   Chief Complaint: Rectal bleeding HPI: The patient with past medical history of seizure disorder, GI bleed in the past, depression and diabetes presents to the emergency department on the insistence of her daughter who found the patient to have blood in her seat this evening.  The patient denies abdominal pain.  She was stable throughout emergency department evaluation.  She is given pantoprazole IV prior to the emergency department staff, hospitalist service for admission.  Past Medical History:  Diagnosis Date  . Anemia   . Anxiety   . Arthritis   . Barrett esophagus   . Cancer ([place]) 2016    Skin cancer right leg mylenoma  . Depression   . Diabetes ([place]) 04/25/2017  . GERD (gastroesophageal reflux disease)   . History of blood transfusion    with a surgery  . History of bronchitis   . History of pneumonia   . Hypertension   . IBS (irritable bowel syndrome)   . Pneumonia 09/27/15  . PTSD (post-traumatic stress disorder)   . Renal insufficiency    reports acute kidney injury  . Seizures ([place])    rare - last one 07/30/14 - was very anemic.  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 Vertigo    thinks related to sinus issues    Past Surgical History:  Procedure Laterality Date  . ABDOMINAL HYSTERECTOMY    . CESAREAN SECTION    . COLONOSCOPY N/A 08/31/2014   Procedure: COLONOSCOPY;  Surgeon: Lucilla Lame, MD;  Location: [place];  Service: Gastroenterology;  Laterality: N/A;  . COLONOSCOPY N/A 08/05/2016   Procedure: COLONOSCOPY;  Surgeon: [name], MD;  Location: [facility] ENDOSCOPY;  Service: Endoscopy;  Laterality: N/A;  . CYST EXCISION  2011   from throat  . ESOPHAGOGASTRODUODENOSCOPY N/A 08/31/2014   Procedure: ESOPHAGOGASTRODUODENOSCOPY (EGD);  Surgeon: Lucilla Lame, MD;  Location: [place];  Service: Gastroenterology;  Laterality: N/A;  . ESOPHAGOGASTRODUODENOSCOPY (EGD) WITH PROPOFOL N/A 07/01/2017   Procedure: ESOPHAGOGASTRODUODENOSCOPY  (EGD) WITH PROPOFOL;  Surgeon: [name], MD;  Location: [facility] ENDOSCOPY;  Service: Gastroenterology;  Laterality: N/A;  . FLEXIBLE SIGMOIDOSCOPY N/A 07/01/2017   Procedure: FLEXIBLE SIGMOIDOSCOPY;  Surgeon: [name], MD;  Location: [facility] ENDOSCOPY;  Service: Gastroenterology;  Laterality: N/A;  . HEMORRHOID SURGERY    . INCISION AND DRAINAGE HIP Right 11/25/2014   Procedure: IRRIGATION  AND DRAINAGEOF RIGHT HIP WITH PLACEMENT OF ANTIBIOUTIC BEADS;  Surgeon: Mcarthur Rossetti, MD;  Location: [place];  Service: Orthopedics;  Laterality: Right;  . INCISION AND DRAINAGE HIP Right 11/28/2014   Procedure: Repeat I&D Right Hip;  Surgeon: Mcarthur Rossetti, MD;  Location: [place];  Service: Orthopedics;  Laterality: Right;  . JOINT REPLACEMENT Right 2007   hip  . TOTAL HIP REVISION Right 03/19/2015   Procedure: RIGHT TOTAL HIP ARTHROPLASTY REVISION;  Surgeon: Meredith Pel, MD;  Location: [place];  Service: Orthopedics;  Laterality: Right;  . TOTAL HIP REVISION Right 11/18/2015   Procedure: TOTAL HIP REVISION;  Surgeon: Frederik Pear, MD;  Location: [place];  Service: Orthopedics;  Laterality: Right;    Family History  Problem Relation Age of Onset  . Alcoholism Father   . Throat cancer Mother    Social History:  reports that she has never smoked. She has never used smokeless tobacco. She reports that she does not drink alcohol or use drugs.  Allergies:  Allergies  Allergen Reactions  . No Known Allergies  Prior to Admission medications   Medication Sig Start Date End Date Taking? Authorizing Provider  acetaminophen (TYLENOL) 500 MG tablet Take 1,000 mg by mouth 2 (two) times daily as needed for mild pain or headache.   Yes [provider]  ALPRAZolam Duanne Moron) 1 MG tablet Take 1 tablet (1 mg total) by mouth 3 (three) times daily as needed for anxiety. 10/19/17  Yes Boscia, Greer Ee, NP  atenolol (TENORMIN) 50 MG tablet Take 1 tablet (50 mg total) by mouth 2 (two) times daily.  10/25/18  Yes Boscia, Greer Ee, NP  carbamazepine (TEGRETOL) 200 MG tablet Take one and one-half tablets (300 mg total) by mouth 2 (two) times daily. Patient taking differently: Take 300 mg by mouth 2 (two) times daily.  01/17/18  Yes Scarboro, Audie Clear, NP  cyclobenzaprine (FLEXERIL) 10 MG tablet Take 1 tablet (10 mg total) by mouth 3 (three) times daily as needed for muscle spasms. 10/07/18  Yes Boscia, Heather E, NP  escitalopram (LEXAPRO) 20 MG tablet Take 20 mg by mouth daily.    Yes [provider]  levETIRAcetam (KEPPRA) 500 MG tablet Take 500 mg by mouth 2 (two) times daily.   Yes [provider]  mirtazapine (REMERON) 15 MG tablet Take 30 mg by mouth at bedtime.    Yes [provider]  omeprazole (PRILOSEC) 40 MG capsule Take 1 capsule (40 mg total) by mouth 2 (two) times daily. 10/25/18 04/23/19 Yes Boscia, Greer Ee, NP  traZODone (DESYREL) 50 MG tablet Take 50 mg by mouth at bedtime.   Yes [provider]     Results for orders placed or performed during the hospital encounter of 12/20/18 (from the past 48 hour(s))  Comprehensive metabolic panel     Status: Abnormal   Collection Time: 12/20/18  2:40 AM  Result Value Ref Range   Sodium 120 (L) 135 - 145 mmol/L   Potassium 3.5 3.5 - 5.1 mmol/L   Chloride 83 (L) 98 - 111 mmol/L   CO2 24 22 - 32 mmol/L   Glucose, Bld 154 (H) 70 - 99 mg/dL   BUN 10 8 - 23 mg/dL   Creatinine, Ser 0.68 0.44 - 1.00 mg/dL   Calcium 9.6 8.9 - 10.3 mg/dL   Total Protein 7.6 6.5 - 8.1 g/dL   Albumin 4.8 3.5 - 5.0 g/dL   AST 23 15 - 41 U/L   ALT 17 0 - 44 U/L   Alkaline Phosphatase 94 38 - 126 U/L   Total Bilirubin 0.6 0.3 - 1.2 mg/dL   GFR calc non Af Amer >60 >60 mL/min   GFR calc Af Amer >60 >60 mL/min   Anion gap 13 5 - 15    Comment: Performed at [facility], [address] [place], [place] [address]  CBC     Status: Abnormal   Collection Time: 12/20/18  2:40 AM  Result Value Ref Range   WBC 8.4 4.0  - 10.5 K/uL   RBC 4.28 3.87 - 5.11 MIL/uL   Hemoglobin 10.7 (L) 12.0 - 15.0 g/dL   HCT 31.7 (L) 36.0 - 46.0 %   MCV 74.1 (L) 80.0 - 100.0 fL   MCH 25.0 (L) 26.0 - 34.0 pg   MCHC 33.8 30.0 - 36.0 g/dL   RDW 15.9 (H) 11.5 - 15.5 %   Platelets 174 150 - 400 K/uL   nRBC 0.0 0.0 - 0.2 %    Comment: Performed at [facility], [address] [place],  [place] [address]  Type and screen [place]     Status: None (Preliminary result)   Collection Time: 12/20/18  2:40 AM  Result Value Ref Range   ABO/RH(D) O NEG    Antibody Screen POS    Sample Expiration 12/23/2018,2359    Antibody Identification      ANTI C ANTI K Performed at [facility], [address] [place], [place] [address]    Unit Number [id number]    Blood Component Type RED CELLS,LR    Unit division 00    Status of Unit ALLOCATED    Transfusion Status OK TO TRANSFUSE    Crossmatch Result COMPATIBLE    Unit Number [id number]    Blood Component Type RBC LR PHER1    Unit division 00    Status of Unit ALLOCATED    Transfusion Status OK TO TRANSFUSE    Crossmatch Result COMPATIBLE   Valproic acid level     Status: Abnormal   Collection Time: 12/20/18  2:40 AM  Result Value Ref Range   Valproic Acid Lvl <10 (L) 50.0 - 100.0 ug/mL    Comment: Performed at [facility], [address] [place], [place] [address]  Urinalysis, Complete w Microscopic     Status: Abnormal   Collection Time: 12/20/18  3:01 AM  Result Value Ref Range   Color, Urine YELLOW (A) YELLOW   APPearance CLEAR (A) CLEAR   Specific Gravity, Urine 1.005 1.005 - 1.030   pH 7.0 5.0 - 8.0   Glucose, UA NEGATIVE NEGATIVE mg/dL   Hgb urine dipstick LARGE (A) NEGATIVE   Bilirubin Urine NEGATIVE NEGATIVE   Ketones, ur NEGATIVE NEGATIVE mg/dL   Protein, ur NEGATIVE NEGATIVE mg/dL   Nitrite NEGATIVE NEGATIVE   Leukocytes,Ua NEGATIVE NEGATIVE   RBC / HPF >50 (H) 0 - 5 RBC/hpf   WBC, UA 0-5 0 - 5  WBC/hpf   Bacteria, UA RARE (A) NONE SEEN   Squamous Epithelial / LPF 0-5 0 - 5    Comment: Performed at [facility], [address] [place],  [address]   No results found.  Review of Systems  Constitutional: Negative for chills and fever.  HENT: Negative for sore throat and tinnitus.   Eyes: Negative for blurred vision and redness.  Respiratory: Negative for cough and shortness of breath.   Cardiovascular: Negative for chest pain, palpitations, orthopnea and PND.  Gastrointestinal: Positive for blood in stool. Negative for abdominal pain, diarrhea, melena, nausea and vomiting.  Genitourinary: Negative for dysuria, frequency and urgency.  Musculoskeletal: Negative for joint pain and myalgias.  Skin: Negative for rash.       No lesions  Neurological: Negative for speech change, focal weakness and weakness.  Endo/Heme/Allergies: Does not bruise/bleed easily.       No temperature intolerance  Psychiatric/Behavioral: Negative for depression and suicidal ideas.    Blood pressure 128/76, pulse 72, temperature 98.3 F (36.8 C), temperature source Oral, resp. rate 20, height 5\' 1"  (1.549 m), weight 90.7 kg, SpO2 100 %. Physical Exam  Vitals reviewed. Constitutional: She is oriented to person, place, and time. She appears well-developed and well-nourished. No distress.  HENT:  Head: Normocephalic and atraumatic.  Mouth/Throat: Oropharynx is clear and moist.  Eyes: Pupils are equal, round, and reactive to light. Conjunctivae and EOM are normal. No scleral icterus.  Neck: Normal range of motion. Neck supple. No JVD present. No tracheal deviation present. No thyromegaly present.  Cardiovascular: Normal rate, regular rhythm and  normal heart sounds. Exam reveals no gallop and no friction rub.  No murmur heard. Respiratory: Effort normal and breath sounds normal.  GI: Soft. Bowel sounds are normal. She exhibits no distension. There is no abdominal tenderness.  Genitourinary:     Genitourinary Comments: Deferred   Musculoskeletal: Normal range of motion.        General: No edema.  Lymphadenopathy:    She has no cervical adenopathy.  Neurological: She is alert and oriented to person, place, and time. No cranial nerve deficit. She exhibits normal muscle tone.  Skin: Skin is warm and dry. No rash noted. No erythema.  Psychiatric: She has a normal mood and affect. Her behavior is normal. Judgment and thought content normal.     Assessment/Plan This is a 63 year old female admitted for GI bleed. 1.  GI bleed: Hematochezia; painless.  Etiology likely NSAID use.  Blood count and blood pressure stable.  Maintain 2 large-bore IVs.  Monitor telemetry.  Consult gastroenterology.  Continue Protonix IV. 2.  Hypertension: Uncontrolled; continue atenolol.  Hydralazine as needed. 3.  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  Once volume resuscitated we will need to restrict the patient's fluid intake. 4.  Diabetes mellitus type 2: Appears to be diet-controlled.  Check hemoglobin A1c. 5.  Seizure disorder: No active seizures in the last 4 years.  Continue carbamazepine and Keppra 6.  DVT prophylaxis: SCDs 7.  GI prophylaxis: None The patient is a full code.  Time spent on admission orders and patient care approximately 45 minutes  Harrie Foreman, MD 12/20/2018, 7:40 AM

## 2018-12-20 NOTE — Progress Notes (Signed)
Refused bed alarm.  

## 2018-12-20 NOTE — ED Provider Notes (Signed)
[facility] Emergency Department Provider Note   ____________________________________________   First MD Initiated Contact with Patient 12/20/18 [phone]     (approximate)  I have reviewed the triage vital signs and the nursing notes.   HISTORY  Chief Complaint Rectal Bleeding    HPI Christina French is a 63 y.o. female who presents to the ED from home with a chief complaint of rectal bleeding.  Patient reports rectal bleeding for the past 2 weeks.  History of similar with blood transfusions.  She was recommended to have a capsule study done by her GI doctor earlier this summer which she declined.  She does admit to overuse of NSAIDs for "aches and pains".  Presents tonight for feeling weak and lightheaded.  Daughter states blood was soaking through on the couch where patient was sitting tonight.  Denies fever, cough, chest pain, shortness of breath, abdominal pain, vomiting.  Does endorse some nausea.  Denies use of anticoagulants.       Past Medical History:  Diagnosis Date  . Anemia   . Anxiety   . Arthritis   . Barrett esophagus   . Cancer ([place]) 2016    Skin cancer right leg mylenoma  . Depression   . Diabetes ([place]) 04/25/2017  . GERD (gastroesophageal reflux disease)   . History of blood transfusion    with a surgery  . History of bronchitis   . History of pneumonia   . Hypertension   . IBS (irritable bowel syndrome)   . Pneumonia 09/27/15  . PTSD (post-traumatic stress disorder)   . Renal insufficiency    reports acute kidney injury  . Seizures ([place])    rare - last one 07/30/14 - was very anemic.  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 Vertigo    thinks related to sinus issues    Patient Active Problem List   Diagnosis Date Noted  . Impaired fasting glucose 11/11/2017  . Low back pain of thoracolumbar region with sciatica 11/11/2017  . Severe anemia   . GI bleed 04/25/2017  . Symptomatic anemia 04/25/2017  . Hyponatremia 04/25/2017  . Diabetes  ([place]) 04/25/2017  . Rectal bleeding 08/03/2016  . 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 Anemia 04/16/2015  . Anxiety disorder 03/20/2015  . Acute hyperglycemia 03/20/2015  . Pulse irregularity 03/20/2015  . Dehydration 03/20/2015  . Septic arthritis ([place])   . Prosthetic hip infection ([place])   . Staphylococcus aureus infection   . Enteritis due to Clostridium difficile   . Screening for breast cancer   . Acute pain of right hip; hip infection 11/24/2014  . Right hip pain 11/24/2014  . Chronic hyponatremia 11/24/2014  . Seizure disorder ([place]) 08/17/2013  . Digestive disorder 08/17/2013  . Essential hypertension 08/17/2013  . Hyperlipidemia 08/17/2013  . Seizure ([place]) 08/17/2013    Past Surgical History:  Procedure Laterality Date  . ABDOMINAL HYSTERECTOMY    . CESAREAN SECTION    . COLONOSCOPY N/A 08/31/2014   Procedure: COLONOSCOPY;  Surgeon: Lucilla Lame, MD;  Location: [place];  Service: Gastroenterology;  Laterality: N/A;  . COLONOSCOPY N/A 08/05/2016   Procedure: COLONOSCOPY;  Surgeon: [name], MD;  Location: [facility] ENDOSCOPY;  Service: Endoscopy;  Laterality: N/A;  . CYST EXCISION  2011   from throat  . ESOPHAGOGASTRODUODENOSCOPY N/A 08/31/2014   Procedure: ESOPHAGOGASTRODUODENOSCOPY (EGD);  Surgeon: Lucilla Lame, MD;  Location: [place];  Service: Gastroenterology;  Laterality: N/A;  . ESOPHAGOGASTRODUODENOSCOPY (EGD) WITH PROPOFOL N/A 07/01/2017  Procedure: ESOPHAGOGASTRODUODENOSCOPY (EGD) WITH PROPOFOL;  Surgeon: [name], MD;  Location: [facility] ENDOSCOPY;  Service: Gastroenterology;  Laterality: N/A;  . FLEXIBLE SIGMOIDOSCOPY N/A 07/01/2017   Procedure: FLEXIBLE SIGMOIDOSCOPY;  Surgeon: [name], MD;  Location: [facility] ENDOSCOPY;  Service: Gastroenterology;  Laterality: N/A;  . HEMORRHOID SURGERY    . INCISION AND DRAINAGE HIP Right 11/25/2014   Procedure: IRRIGATION  AND DRAINAGEOF RIGHT HIP WITH PLACEMENT OF ANTIBIOUTIC BEADS;  Surgeon: Mcarthur Rossetti, MD;  Location: [place];  Service: Orthopedics;  Laterality: Right;  . INCISION AND DRAINAGE HIP Right 11/28/2014   Procedure: Repeat I&D Right Hip;  Surgeon: Mcarthur Rossetti, MD;  Location: [place];  Service: Orthopedics;  Laterality: Right;  . JOINT REPLACEMENT Right 2007   hip  . TOTAL HIP REVISION Right 03/19/2015   Procedure: RIGHT TOTAL HIP ARTHROPLASTY REVISION;  Surgeon: Meredith Pel, MD;  Location: [place];  Service: Orthopedics;  Laterality: Right;  . TOTAL HIP REVISION Right 11/18/2015   Procedure: TOTAL HIP REVISION;  Surgeon: Frederik Pear, MD;  Location: [place];  Service: Orthopedics;  Laterality: Right;    Prior to Admission medications   Medication Sig Start Date End Date Taking? Authorizing Provider  acetaminophen (TYLENOL) 500 MG tablet Take 1,000 mg by mouth 2 (two) times daily as needed for mild pain or headache.   Yes [provider]  ALPRAZolam Duanne Moron) 1 MG tablet Take 1 tablet (1 mg total) by mouth 3 (three) times daily as needed for anxiety. 10/19/17  Yes Boscia, Greer Ee, NP  atenolol (TENORMIN) 50 MG tablet Take 1 tablet (50 mg total) by mouth 2 (two) times daily. 10/25/18  Yes Boscia, Greer Ee, NP  carbamazepine (TEGRETOL) 200 MG tablet Take one and one-half tablets (300 mg total) by mouth 2 (two) times daily. Patient taking differently: Take 300 mg by mouth 2 (two) times daily.  01/17/18  Yes Scarboro, Audie Clear, NP  cyclobenzaprine (FLEXERIL) 10 MG tablet Take 1 tablet (10 mg total) by mouth 3 (three) times daily as needed for muscle spasms. 10/07/18  Yes Boscia, Heather E, NP  escitalopram (LEXAPRO) 20 MG tablet Take 20 mg by mouth daily.    Yes [provider]  levETIRAcetam (KEPPRA) 500 MG tablet Take 500 mg by mouth 2 (two) times daily.   Yes [provider]  mirtazapine (REMERON) 15 MG tablet Take 30 mg by mouth at bedtime.    Yes [provider]  omeprazole (PRILOSEC) 40 MG capsule Take 1 capsule (40 mg total) by mouth  2 (two) times daily. 10/25/18 04/23/19 Yes Boscia, Greer Ee, NP  traZODone (DESYREL) 50 MG tablet Take 50 mg by mouth at bedtime.   Yes [provider]  ondansetron (ZOFRAN) 8 MG tablet Take 1 tablet (8 mg total) by mouth as needed for nausea or vomiting. Patient not taking: Reported on 12/20/2018 05/24/17   [name], MD    Allergies No known allergies  Family History  Problem Relation Age of Onset  . Alcoholism Father   . Throat cancer Mother     Social History Social History   Tobacco Use  . Smoking status: Never Smoker  . Smokeless tobacco: Never Used  Substance Use Topics  . Alcohol use: No    Alcohol/week: 0.0 standard drinks  . Drug use: No    Review of Systems  Constitutional: No fever/chills Eyes: No visual changes. ENT: No sore throat. Cardiovascular: Denies chest pain. Respiratory: Denies shortness of breath. Gastrointestinal: No abdominal pain.  Positive for nausea, no vomiting.  No diarrhea.  No constipation.  Positive for rectal bleeding. Genitourinary: Negative for dysuria. Musculoskeletal: Negative for back pain. Skin: Negative for rash. Neurological: Negative for headaches, focal weakness or numbness.   ____________________________________________   PHYSICAL EXAM:  VITAL SIGNS: ED Triage Vitals [12/20/18 0234]  Enc Vitals Group     BP (!) 165/89     Pulse Rate 84     Resp 20     Temp 98.3 F (36.8 C)     Temp Source Oral     SpO2 98 %     Weight 200 lb (90.7 kg)     Height 5\' 1"  (1.549 m)     Head Circumference      Peak Flow      Pain Score 6     Pain Loc      Pain Edu?      Excl. in [place]?     Constitutional: Alert and oriented. Well appearing and in mild acute distress. Eyes: Conjunctivae are normal. PERRL. EOMI. Head: Atraumatic. Nose: No congestion/rhinnorhea. Mouth/Throat: Mucous membranes are moist.  Oropharynx non-erythematous. Neck: No stridor.   Cardiovascular: Normal rate, regular rhythm. Grossly normal heart  sounds.  Good peripheral circulation. Respiratory: Normal respiratory effort.  No retractions. Lungs CTAB. Gastrointestinal: Soft and nontender. No distention. No abdominal bruits. No CVA tenderness. Musculoskeletal: No lower extremity tenderness nor edema.  No joint effusions. Neurologic:  Normal speech and language. No gross focal neurologic deficits are appreciated. No gait instability. Skin:  Skin is warm, dry and intact. No rash noted. Psychiatric: Mood and affect are normal. Speech and behavior are normal.  ____________________________________________   LABS (all labs ordered are listed, but only abnormal results are displayed)  Labs Reviewed  COMPREHENSIVE METABOLIC PANEL - Abnormal; Notable for the following components:      Result Value   Sodium 120 (*)    Chloride 83 (*)    Glucose, Bld 154 (*)    All other components within normal limits  CBC - Abnormal; Notable for the following components:   Hemoglobin 10.7 (*)    HCT 31.7 (*)    MCV 74.1 (*)    MCH 25.0 (*)    RDW 15.9 (*)    All other components within normal limits  URINALYSIS, COMPLETE (UACMP) WITH MICROSCOPIC - Abnormal; Notable for the following components:   Color, Urine YELLOW (*)    APPearance CLEAR (*)    Hgb urine dipstick LARGE (*)    RBC / HPF >50 (*)    Bacteria, UA RARE (*)    All other components within normal limits  SARS CORONAVIRUS 2 (TAT 6-12 HRS)  VALPROIC ACID LEVEL  TYPE AND SCREEN   ____________________________________________  EKG  ED ECG REPORT I, Aureliano Oshields J, the attending physician, personally viewed and interpreted this ECG.   Date: 12/20/2018  EKG Time: 0242  Rate: 81  Rhythm: normal EKG, normal sinus rhythm  Axis: Normal  Intervals:none  ST&T Change: Nonspecific  ____________________________________________  RADIOLOGY  ED MD interpretation: None  Official radiology report(s): No results  found.  ____________________________________________   PROCEDURES  Procedure(s) performed (including Critical Care):  Procedures  Rectal exam: External exam unremarkable.  Gross hematochezia noted. ____________________________________________   INITIAL IMPRESSION / ASSESSMENT AND PLAN / ED COURSE  As part of my medical decision making, I reviewed the following data within the [place] History obtained from family, Nursing notes reviewed and incorporated, Labs reviewed, EKG interpreted, Old chart reviewed,  Discussed with admitting physician Dr. Marcille Blanco and Notes from prior ED visits     SAMIRAH MACARTHUR was evaluated in Emergency Department on 12/20/2018 for the symptoms described in the history of present illness. She was evaluated in the context of the global COVID-19 pandemic, which necessitated consideration that the patient might be at risk for infection with the SARS-CoV-2 virus that causes COVID-19. Institutional protocols and algorithms that pertain to the evaluation of patients at risk for COVID-19 are in a state of rapid change based on information released by regulatory bodies including the CDC and federal and state organizations. These policies and algorithms were followed during the patient's care in the ED.   63 year old female who presents with rectal bleeding; history of same requiring blood transfusions.  Differential diagnosis includes but is not limited to PUD, esophagitis, AVM, diverticular bleed, etc.  Lab work notable for hyponatremia which has been trending down for the past year.  H/H is stable.  Will initiate IV fluid resuscitation, IV Protonix for NSAID use and discuss with hospitalist to evaluate patient in the emergency department for admission.      ____________________________________________   FINAL CLINICAL IMPRESSION(S) / ED DIAGNOSES  Final diagnoses:  Rectal bleeding  Hematochezia  Hyponatremia     ED Discharge Orders    None        Note:  This document was prepared using Dragon voice recognition software and may include unintentional dictation errors.   Paulette Blanch, MD 12/20/18 [phone]

## 2018-12-20 NOTE — ED Notes (Signed)
Patient c/o urinary frequency after triage was completed. Urinalysis added to protocols.

## 2018-12-20 NOTE — ED Notes (Signed)
Pt to the er for rectal bleeding x 2 weeks. Asked pt if she had contacted md. Pt states she had not. Explained the importance of going to the md for rectal bleeding. Pt has a hx of hemorrhoids. Pt had no blood present on the last visit to the bathroom.

## 2018-12-20 NOTE — Progress Notes (Addendum)
[facility] NAME: Christina French    MR#:  [id number]  DATE OF BIRTH:  1956-02-17  SUBJECTIVE:  CHIEF COMPLAINT:   Chief Complaint  Patient presents with  . Rectal Bleeding  Patient seen and evaluated this morning Presented to emergency room for rectal bleed No vomiting of blood Had extensive GI work-up in the past with EGD and colonoscopy  REVIEW OF SYSTEMS:    ROS  CONSTITUTIONAL: No documented fever. Has fatigue, weakness. No weight gain, no weight loss.  EYES: No blurry or double vision.  ENT: No tinnitus. No postnasal drip. No redness of the oropharynx.  RESPIRATORY: No cough, no wheeze, no hemoptysis. No dyspnea.  CARDIOVASCULAR: No chest pain. No orthopnea. No palpitations. No syncope.  GASTROINTESTINAL: No nausea, no vomiting or diarrhea. No abdominal pain Had rectal bleed. GENITOURINARY: No dysuria or hematuria.  ENDOCRINE: No polyuria or nocturia. No heat or cold intolerance.  HEMATOLOGY: No anemia. No bruising. No bleeding.  INTEGUMENTARY: No rashes. No lesions.  MUSCULOSKELETAL: No arthritis. No swelling. No gout.  NEUROLOGIC: No numbness, tingling, or ataxia. No seizure-type activity.  PSYCHIATRIC: No anxiety. No insomnia. No ADD.   DRUG ALLERGIES:   Allergies  Allergen Reactions  . No Known Allergies     VITALS:  Blood pressure (!) 163/92, pulse 68, temperature 98.1 F (36.7 C), temperature source Oral, resp. rate 18, height 5\' 1"  (1.549 m), weight 90.7 kg, SpO2 100 %.  PHYSICAL EXAMINATION:   Physical Exam  GENERAL:  63 y.o.-year-old patient lying in the bed with no acute distress.  EYES: Pupils equal, round, reactive to light and accommodation. No scleral icterus. Extraocular muscles intact.  HEENT: Head atraumatic, normocephalic. Oropharynx and nasopharynx clear.  Pallor present. NECK:  Supple, no jugular venous distention. No thyroid enlargement, no tenderness.  LUNGS: Normal breath sounds  bilaterally, no wheezing, rales, rhonchi. No use of accessory muscles of respiration.  CARDIOVASCULAR: S1, S2 normal. No murmurs, rubs, or gallops.  ABDOMEN: Soft, nontender, nondistended. Bowel sounds present. No organomegaly or mass.  EXTREMITIES: No cyanosis, clubbing or edema b/l.    NEUROLOGIC: Cranial nerves II through XII are intact. No focal Motor or sensory deficits b/l.   PSYCHIATRIC: The patient is alert and oriented x 3.  SKIN: No obvious rash, lesion, or ulcer.   LABORATORY PANEL:   CBC Recent Labs  Lab 12/20/18 0240  WBC 8.4  HGB 10.7*  HCT 31.7*  PLT 174   ------------------------------------------------------------------------------------------------------------------ Chemistries  Recent Labs  Lab 12/20/18 0240  NA 120*  K 3.5  CL 83*  CO2 24  GLUCOSE 154*  BUN 10  CREATININE 0.68  CALCIUM 9.6  AST 23  ALT 17  ALKPHOS 94  BILITOT 0.6   ------------------------------------------------------------------------------------------------------------------  Cardiac Enzymes No results for input(s): TROPONINI in the last 168 hours. ------------------------------------------------------------------------------------------------------------------  RADIOLOGY:  No results found.   ASSESSMENT AND PLAN:  63 year old female patient with history of seizure disorder, GI bleed, Barrett's esophagus, arthritis, hypertension, posttraumatic stress disorder, seizures currently under hospitalist service for rectal bleed  -Acute gastrointestinal bleeding Discussed patient to stop NSAID use Gastroenterology consult Had extensive GI work-up in the past Patient's has again rectal bleed will consider bleeding scan as per GI recommendations Continue IV proton pump inhibitor  -Hypovolemic hyponatremia Improving with IV fluids Current sodium level 125  -Anemia Monitor hemoglobin hematocrit If hemoglobin drops less than 7 PRBC transfusion  -Iron deficiency IV iron  infusion today  -DVT prophylaxis sequential compression device  to lower extremities  -Type 2 diabetes mellitus Diet controlled  -Hypertension Continue beta-blocker and hydralazine as needed for control of blood pressure   All the records are reviewed and case discussed with Care Management/Social Worker. Management plans discussed with the patient, family and they are in agreement.  CODE STATUS: Full code  DVT Prophylaxis: SCDs  TOTAL TIME TAKING CARE OF THIS PATIENT: 45 minutes.   POSSIBLE D/C IN 2 to 3 DAYS, DEPENDING ON CLINICAL CONDITION.  Saundra Shelling M.D on 12/20/2018 at 9:59 AM  Between 7am to 6pm - Pager - [phone]  After 6pm go to [email or web address] - password [email or web address] [facility]  Office  [phone]  CC: Primary care physician; [name], MD  Note: This dictation was prepared with Dragon dictation along with smaller phrase technology. Any transcriptional errors that result from this process are unintentional.

## 2018-12-20 NOTE — Consult Note (Signed)
Christina Antigua, MD [address], [place], [place], [place], [address] [address], [place], [place], [place], [address] Phone: [phone]  Fax: [phone]  Consultation  Referring Provider:     Dr. Marcille Blanco Primary Care Physician:  [name], MD Reason for Consultation:     Hematochezia  Date of Admission:  12/20/2018 Date of Consultation:  12/20/2018         HPI:   Christina French is a 63 y.o. female presents with complaints of rectal bleeding at home for the last 2 weeks. Bright red blood daily for 2 weeks. No melena.  Reports abdominal cramping. Has been using Advil twice a day for 2-3 weeks.  No nausea or vomiting.  No hematemesis.  Denies any anticoagulants.  Does use NSAIDs intermittently.  Sees Dr. Vicente Males in GI clinic, but has failed to follow-up in over 1 year.  Has had previous work-up for rectal bleeding with last EGD and colonoscopy in March 2019.  Impression:           - Normal examined duodenum. Biopsied.                       - Normal stomach.                       - Esophageal mucosal changes suspicious for                        long-segment Barrett's esophagus. Biopsied.  Impression:           - Non-bleeding internal hemorrhoids.                       - The examination was otherwise normal.  Capsule study was recommended, but patient did not schedule.  Patient was also admitted in April 2018 for rectal bleeding, with hemoglobin of 5.4 on that admission.  As per Dr. Georgeann Oppenheim note, she underwent colonoscopy which was normal except for large internal hemorrhoids, normal terminal ileum.  She was also admitted in December 2018, and since patient had undergone recent procedures within the year, the plan was to proceed with bleeding scan.  However, this was not done as patient had stopped bleeding prior to that scan.  She also underwent EGD and colonoscopy in 2016 for iron deficiency anemia.  2 subcentimeter polyps were removed.  Nonbleeding internal hemorrhoids  were noted. EGD showed Barrett's esophagus, and gastritis.    Past Medical History:  Diagnosis Date  . Anemia   . Anxiety   . Arthritis   . Barrett esophagus   . Cancer ([place]) 2016    Skin cancer right leg mylenoma  . Depression   . Diabetes ([place]) 04/25/2017  . GERD (gastroesophageal reflux disease)   . History of blood transfusion    with a surgery  . History of bronchitis   . History of pneumonia   . Hypertension   . IBS (irritable bowel syndrome)   . Pneumonia 09/27/15  . PTSD (post-traumatic stress disorder)   . Renal insufficiency    reports acute kidney injury  . Seizures ([place])    rare - last one 07/30/14 - was very anemic.  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 Vertigo    thinks related to sinus issues    Past Surgical History:  Procedure Laterality Date  . ABDOMINAL HYSTERECTOMY    . CESAREAN SECTION    .  COLONOSCOPY N/A 08/31/2014   Procedure: COLONOSCOPY;  Surgeon: Lucilla Lame, MD;  Location: [facility];  Service: Gastroenterology;  Laterality: N/A;  . COLONOSCOPY N/A 08/05/2016   Procedure: COLONOSCOPY;  Surgeon: [name], MD;  Location: [facility] ENDOSCOPY;  Service: Endoscopy;  Laterality: N/A;  . CYST EXCISION  2011   from throat  . ESOPHAGOGASTRODUODENOSCOPY N/A 08/31/2014   Procedure: ESOPHAGOGASTRODUODENOSCOPY (EGD);  Surgeon: Lucilla Lame, MD;  Location: [facility];  Service: Gastroenterology;  Laterality: N/A;  . ESOPHAGOGASTRODUODENOSCOPY (EGD) WITH PROPOFOL N/A 07/01/2017   Procedure: ESOPHAGOGASTRODUODENOSCOPY (EGD) WITH PROPOFOL;  Surgeon: [name], MD;  Location: [facility] ENDOSCOPY;  Service: Gastroenterology;  Laterality: N/A;  . FLEXIBLE SIGMOIDOSCOPY N/A 07/01/2017   Procedure: FLEXIBLE SIGMOIDOSCOPY;  Surgeon: [name], MD;  Location: [facility] ENDOSCOPY;  Service: Gastroenterology;  Laterality: N/A;  . HEMORRHOID SURGERY    . INCISION AND DRAINAGE HIP Right 11/25/2014   Procedure: IRRIGATION  AND DRAINAGEOF RIGHT HIP WITH PLACEMENT OF ANTIBIOUTIC  BEADS;  Surgeon: Mcarthur Rossetti, MD;  Location: [facility];  Service: Orthopedics;  Laterality: Right;  . INCISION AND DRAINAGE HIP Right 11/28/2014   Procedure: Repeat I&D Right Hip;  Surgeon: Mcarthur Rossetti, MD;  Location: [facility];  Service: Orthopedics;  Laterality: Right;  . JOINT REPLACEMENT Right 2007   hip  . TOTAL HIP REVISION Right 03/19/2015   Procedure: RIGHT TOTAL HIP ARTHROPLASTY REVISION;  Surgeon: Meredith Pel, MD;  Location: [facility];  Service: Orthopedics;  Laterality: Right;  . TOTAL HIP REVISION Right 11/18/2015   Procedure: TOTAL HIP REVISION;  Surgeon: Frederik Pear, MD;  Location: [facility];  Service: Orthopedics;  Laterality: Right;    Prior to Admission medications   Medication Sig Start Date End Date Taking? Authorizing Provider  acetaminophen (TYLENOL) 500 MG tablet Take 1,000 mg by mouth 2 (two) times daily as needed for mild pain or headache.   Yes [provider]  ALPRAZolam Duanne Moron) 1 MG tablet Take 1 tablet (1 mg total) by mouth 3 (three) times daily as needed for anxiety. 10/19/17  Yes Boscia, Greer Ee, NP  atenolol (TENORMIN) 50 MG tablet Take 1 tablet (50 mg total) by mouth 2 (two) times daily. 10/25/18  Yes Boscia, Greer Ee, NP  carbamazepine (TEGRETOL) 200 MG tablet Take one and one-half tablets (300 mg total) by mouth 2 (two) times daily. Patient taking differently: Take 300 mg by mouth 2 (two) times daily.  01/17/18  Yes Scarboro, Audie Clear, NP  cyclobenzaprine (FLEXERIL) 10 MG tablet Take 1 tablet (10 mg total) by mouth 3 (three) times daily as needed for muscle spasms. 10/07/18  Yes Boscia, Heather E, NP  escitalopram (LEXAPRO) 20 MG tablet Take 20 mg by mouth daily.    Yes [provider]  levETIRAcetam (KEPPRA) 500 MG tablet Take 500 mg by mouth 2 (two) times daily.   Yes [provider]  mirtazapine (REMERON) 15 MG tablet Take 30 mg by mouth at bedtime.    Yes [provider]  omeprazole (PRILOSEC) 40 MG capsule Take 1  capsule (40 mg total) by mouth 2 (two) times daily. 10/25/18 04/23/19 Yes Boscia, Greer Ee, NP  traZODone (DESYREL) 50 MG tablet Take 50 mg by mouth at bedtime.   Yes [provider]    Family History  Problem Relation Age of Onset  . Alcoholism Father   . Throat cancer Mother      Social History   Tobacco Use  . Smoking status: Never Smoker  . Smokeless tobacco:  Never Used  Substance Use Topics  . Alcohol use: No    Alcohol/week: 0.0 standard drinks  . Drug use: No    Allergies as of 12/20/2018 - Review Complete 12/20/2018  Allergen Reaction Noted  . No known allergies  11/17/2015    Review of Systems:    All systems reviewed and negative except where noted in HPI.   Physical Exam:  Vital signs in last 24 hours: Vitals:   12/20/18 0715 12/20/18 0730 12/20/18 0814 12/20/18 1326  BP:  (!) 163/93 (!) 163/92 (!) 148/81  Pulse: 72 64 68 71  Resp:   18 15  Temp:   98.1 F (36.7 C) 98 F (36.7 C)  TempSrc:   Oral Oral  SpO2: 100% 98% 100% 98%  Weight:      Height:         General:   Pleasant, cooperative in NAD Head:  Normocephalic and atraumatic. Eyes:   No icterus.   Conjunctiva pink. PERRLA. Ears:  Normal auditory acuity. Neck:  Supple; no masses or thyroidomegaly Lungs: Respirations even and unlabored. Lungs clear to auscultation bilaterally.   No wheezes, crackles, or rhonchi.  Abdomen:  Soft, nondistended, nontender. Normal bowel sounds. No appreciable masses or hepatomegaly.  No rebound or guarding.  Rectal exam: Residual small skin tag in perianal area. DRE with minute strands of red blood. No mass or stool in rectal vault. Neurologic:  Alert and oriented x3;  grossly normal neurologically. Skin:  Intact without significant lesions or rashes. Cervical Nodes:  No significant cervical adenopathy. Psych:  Alert and cooperative. Normal affect.  LAB RESULTS: Recent Labs    12/20/18 0240 12/20/18 1307  WBC 8.4  --   HGB 10.7* 10.0*  HCT 31.7*  29.9*  PLT 174  --    BMET Recent Labs    12/20/18 0240 12/20/18 1307  NA 120* 125*  K 3.5  --   CL 83*  --   CO2 24  --   GLUCOSE 154*  --   BUN 10  --   CREATININE 0.68  --   CALCIUM 9.6  --    LFT Recent Labs    12/20/18 0240  PROT 7.6  ALBUMIN 4.8  AST 23  ALT 17  ALKPHOS 94  BILITOT 0.6   PT/INR No results for input(s): LABPROT, INR in the last 72 hours.  STUDIES: No results found.    Impression / Plan:   LORALYE REFFNER is a 63 y.o. y/o female with hematochezia, and history of iron deficiency anemia  Last known ferritin was over a year ago. Would recommend rechecking on this admission and if low, replace with IV iron if appropriate.  Given that patient's rectal exam did show red blood, and patient has been taking NSAIDs for the last 2 to 3 weeks, it is important to first do EGD and colonoscopy before proceeding to a capsule study, to evaluate the source of hematochezia.  Possibilities include hemorrhoids, ischemic colitis, diverticulosis, ulcers.  Continue to avoid NSAIDs PPI IV twice daily  Continue serial CBCs and transfuse PRN Avoid NSAIDs Maintain 2 large-bore IV lines Please page GI with any acute hemodynamic changes, or signs of active GI bleeding  I discussed patient's chronic hyponatremia with anesthesia, Dr. Ronelle Nigh, and he recommended that internal medicine team consider further work-up or at least comment on the cause of her hyponatremia before proceeding with the procedure.  If above procedures are negative, patient will need small bowel capsule study for further evaluation.  however, if patient has any signs of active GI bleeding today, please obtain RBC scan to localize source of bleeding  I have discussed alternative options, risks & benefits,  which include, but are not limited to, bleeding, infection, perforation, risk of capsule getting stuck in the small bowel and needing surgery for removal, respiratory complication & drug reaction.  The  patient agrees with this plan & written consent will be obtained.     Thank you for involving me in the care of this patient.      LOS: 0 days   Virgel Manifold, MD  12/20/2018, 1:36 PM

## 2018-12-20 NOTE — ED Notes (Signed)
Purewick placed on pt. Televsion turned on by request and heat turned up.

## 2018-12-20 NOTE — ED Triage Notes (Signed)
Patient to ER for c/o rectal bleeding ("that's been going on for a while, but is getting worse"). Patient also reports very high BP at home and numbness/tingling to left arm ("that has been that way a long time - 3 weeks ago). Patient states bleeding has been severe enough that she is having to wear pad and is bright red blood. Patient reports history of the same in the past.

## 2018-12-21 ENCOUNTER — Encounter
Admission: EM | Disposition: A | Discharge status: Home / Self Care | Payer: Self-pay | Source: Home / Self Care | Attending: Internal Medicine

## 2018-12-21 ENCOUNTER — Inpatient Hospital Stay: Payer: Medicare Other | Admitting: Anesthesiology

## 2018-12-21 ENCOUNTER — Encounter: Payer: Self-pay | Admitting: *Deleted

## 2018-12-21 DIAGNOSIS — K2270 Barrett's esophagus without dysplasia: Secondary | ICD-10-CM

## 2018-12-21 HISTORY — PX: ESOPHAGOGASTRODUODENOSCOPY: SHX5428

## 2018-12-21 LAB — BASIC METABOLIC PANEL
Anion gap: 10 (ref 5–15)
BUN: 10 mg/dL (ref 8–23)
CO2: 24 mmol/L (ref 22–32)
Calcium: 9 mg/dL (ref 8.9–10.3)
Chloride: 94 mmol/L — ABNORMAL LOW (ref 98–111)
Creatinine, Ser: 0.76 mg/dL (ref 0.44–1.00)
GFR calc Af Amer: >60 mL/min (ref >60)
GFR calc non Af Amer: >60 mL/min (ref >60)
Glucose, Bld: 127 mg/dL — ABNORMAL HIGH (ref 70–99)
Potassium: 3.8 mmol/L (ref 3.5–5.1)
Sodium: 128 mmol/L — ABNORMAL LOW (ref 135–145)

## 2018-12-21 LAB — IRON AND TIBC
Iron: 395 ug/dL — ABNORMAL HIGH (ref 28–170)
Saturation Ratios: 89 % — ABNORMAL HIGH (ref 10.4–31.8)
TIBC: 446 ug/dL (ref 250–450)
UIBC: 51 ug/dL

## 2018-12-21 LAB — HEMOGLOBIN AND HEMATOCRIT, BLOOD
HCT: 31.6 % — ABNORMAL LOW (ref 36.0–46.0)
HCT: 31.5 % — ABNORMAL LOW (ref 36.0–46.0)
Hemoglobin: 10.4 g/dL — ABNORMAL LOW (ref 12.0–15.0)
Hemoglobin: 10.3 g/dL — ABNORMAL LOW (ref 12.0–15.0)

## 2018-12-21 SURGERY — EGD (ESOPHAGOGASTRODUODENOSCOPY)
Anesthesia: General

## 2018-12-21 MED ORDER — POLYETHYLENE GLYCOL 3350 17 GM/SCOOP PO POWD
1.0000 | Freq: Once | ORAL | Status: AC
  Administered 2018-12-21: 255 g via ORAL
  Filled 2018-12-21: qty 255

## 2018-12-21 MED ORDER — PROPOFOL 10 MG/ML IV BOLUS
INTRAVENOUS | Status: DC | PRN
  Administered 2018-12-21: 70 mg via INTRAVENOUS

## 2018-12-21 MED ORDER — PROPOFOL 500 MG/50ML IV EMUL
INTRAVENOUS | Status: DC | PRN
  Administered 2018-12-21: 140 ug/kg/min via INTRAVENOUS

## 2018-12-21 MED ORDER — SALINE SPRAY 0.65 % NA SOLN
1.0000 | NASAL | Status: DC | PRN
  Filled 2018-12-21: qty 44

## 2018-12-21 NOTE — Anesthesia Preprocedure Evaluation (Signed)
Anesthesia Evaluation  Patient identified by MRN, date of birth, ID band Patient awake    Reviewed: Allergy & Precautions, NPO status , Patient's Chart, lab work & pertinent test results  History of Anesthesia Complications Negative for: history of anesthetic complications  Airway Mallampati: II  TM Distance: >3 FB Neck ROM: Full    Dental  (+) Poor Dentition   Pulmonary neg pulmonary ROS, neg COPD, neg recent URI,    breath sounds clear to auscultation- rhonchi (-) wheezing      Cardiovascular hypertension, Pt. on medications (-) CAD, (-) Past MI, (-) Cardiac Stents and (-) CABG  Rhythm:Regular Rate:Normal - Systolic murmurs and - Diastolic murmurs    Neuro/Psych Seizures -, Well Controlled,  PSYCHIATRIC DISORDERS Anxiety Depression    GI/Hepatic Neg liver ROS, GERD  ,  Endo/Other  diabetes  Renal/GU negative Renal ROS     Musculoskeletal  (+) Arthritis ,   Abdominal (+) + obese,   Peds  Hematology  (+) anemia ,   Anesthesia Other Findings Past Medical History: No date: Anemia No date: Anxiety No date: Arthritis No date: Barrett esophagus 2016 : Cancer ([place])     Comment:  Skin cancer right leg mylenoma No date: Depression 04/25/2017: Diabetes ([place]) No date: GERD (gastroesophageal reflux disease) No date: History of blood transfusion     Comment:  with a surgery No date: History of bronchitis No date: History of pneumonia No date: Hypertension No date: IBS (irritable bowel syndrome) 09/27/15: Pneumonia No date: PTSD (post-traumatic stress disorder) No date: Renal insufficiency     Comment:  reports acute kidney injury No date: Seizures ([place])     Comment:  rare - last one 07/30/14 - was very anemic.  Silent               seziure -June 2016-  No date: Vertigo     Comment:  thinks related to sinus issues   Reproductive/Obstetrics                             Anesthesia  Physical Anesthesia Plan  ASA: III  Anesthesia Plan: General   Post-op Pain Management:    Induction: Intravenous  PONV Risk Score and Plan: 2 and Propofol infusion  Airway Management Planned: Natural Airway  Additional Equipment:   Intra-op Plan:   Post-operative Plan:   Informed Consent: I have reviewed the patients History and Physical, chart, labs and discussed the procedure including the risks, benefits and alternatives for the proposed anesthesia with the patient or authorized representative who has indicated his/her understanding and acceptance.     Dental advisory given  Plan Discussed with: CRNA and Anesthesiologist  Anesthesia Plan Comments:         Anesthesia Quick Evaluation

## 2018-12-21 NOTE — Plan of Care (Signed)
Tolerating the miralax prep for colonoscopy tomorrow!Marland Kitchen

## 2018-12-21 NOTE — Transfer of Care (Signed)
Immediate Anesthesia Transfer of Care Note  Patient: CHAMIKA KINSEL  Procedure(s) Performed: ESOPHAGOGASTRODUODENOSCOPY (EGD) (N/A )  Patient Location: PACU  Anesthesia Type:General  Level of Consciousness: awake, alert  and oriented  Airway & Oxygen Therapy: Patient Spontanous Breathing  Post-op Assessment: Report given to RN and Post -op Vital signs reviewed and stable  Post vital signs: Reviewed and stable  Last Vitals:  Vitals Value Taken Time  BP 130/43 12/21/18 1224  Temp 36.3 C 12/21/18 1224  Pulse 94 12/21/18 1224  Resp 28 12/21/18 1224  SpO2 97 % 12/21/18 1224    Last Pain:  Vitals:   12/21/18 1059  TempSrc: Oral  PainSc: 0-No pain         Complications: No apparent anesthesia complications

## 2018-12-21 NOTE — Anesthesia Post-op Follow-up Note (Signed)
Anesthesia QCDR form completed.        

## 2018-12-21 NOTE — Anesthesia Postprocedure Evaluation (Signed)
Anesthesia Post Note  Patient: Christina French  Procedure(s) Performed: ESOPHAGOGASTRODUODENOSCOPY (EGD) (N/A )  Patient location during evaluation: Endoscopy Anesthesia Type: General Level of consciousness: awake and alert and oriented Pain management: pain level controlled Vital Signs Assessment: post-procedure vital signs reviewed and stable Respiratory status: spontaneous breathing, nonlabored ventilation and respiratory function stable Cardiovascular status: blood pressure returned to baseline and stable Postop Assessment: no signs of nausea or vomiting Anesthetic complications: no     Last Vitals:  Vitals:   12/21/18 1233 12/21/18 1243  BP: 139/82 136/84  Pulse: 91 95  Resp: (!) 21   Temp:    SpO2: 95% 99%    Last Pain:  Vitals:   12/21/18 1223  TempSrc:   PainSc: Asleep                 Menno Vanbergen

## 2018-12-21 NOTE — Op Note (Signed)
[facility] Gastroenterology Patient Name: Christina French Procedure Date: 12/21/2018 12:04 PM MRN: [id number] Account #: [id number] Date of Birth: Mar 19, 1956 Admit Type: Outpatient Age: 63 Room: [facility] ENDO ROOM 2 Gender: Female Note Status: Finalized Procedure:            Upper GI endoscopy Indications:          Hematochezia Providers:            Lamin Chandley B. Bonna Gains MD, MD Referring MD:         Placido Sou (Referring MD) Medicines:            Monitored Anesthesia Care Complications:        No immediate complications. Procedure:            Pre-Anesthesia Assessment:                       - Prior to the procedure, a History and Physical was                        performed, and patient medications, allergies and                        sensitivities were reviewed. The patient's tolerance of                        previous anesthesia was reviewed.                       - The risks and benefits of the procedure and the                        sedation options and risks were discussed with the                        patient. All questions were answered and informed                        consent was obtained.                       - Patient identification and proposed procedure were                        verified prior to the procedure by the physician, the                        nurse, the anesthesiologist, the anesthetist and the                        technician. The procedure was verified in the procedure                        room.                       - ASA Grade Assessment: II - A patient with mild                        systemic disease.                       After obtaining informed  consent, the endoscope was                        passed under direct vision. Throughout the procedure,                        the patient's blood pressure, pulse, and oxygen                        saturations were monitored continuously. The Endoscope                        was  introduced through the mouth, and advanced to the                        third part of duodenum. The upper GI endoscopy was                        accomplished with ease. The patient tolerated the                        procedure well. Findings:      There were esophageal mucosal changes classified as Barrett's stage       C4-M4 per Prague criteria present in the distal esophagus. The maximum       longitudinal extent of these mucosal changes was 4 cm in length.       Previously biopsied      A single 4 mm sessile polyp with no bleeding and no stigmata of recent       bleeding was found in the gastric fundus. Biopsies were not done due to       patient's recent GI bleeding.      The entire examined stomach was normal.      The duodenal bulb, second portion of the duodenum and examined duodenum       were normal. Impression:           - Esophageal mucosal changes classified as Barrett's                        stage C4-M4 per Prague criteria.                       - Normal stomach.                       - Normal duodenal bulb, second portion of the duodenum                        and examined duodenum.                       - No specimens collected. Recommendation:       - Clear liquid diet.                       - Continue present medications.                       - Patient has a contact number available for                        emergencies. The signs  and symptoms of potential                        delayed complications were discussed with the patient.                        Return to normal activities tomorrow. Written discharge                        instructions were provided to the patient.                       - The findings and recommendations were discussed with                        the patient.                       - Perform a colonoscopy tomorrow.                       - Return to GI clinic in 4 weeks. Procedure Code(s):    --- Professional ---                        [phone], Esophagogastroduodenoscopy, flexible, transoral;                        diagnostic, including collection of specimen(s) by                        brushing or washing, when performed (separate procedure) Diagnosis Code(s):    --- Professional ---                       K22.70, Barrett's esophagus without dysplasia                       K92.1, Melena (includes Hematochezia) CPT copyright 2019 American Medical Association. All rights reserved. The codes documented in this report are preliminary and upon coder review may  be revised to meet current compliance requirements.  [name], MD Margretta Sidle B. Bonna Gains MD, MD 12/21/2018 12:32:56 PM This report has been signed electronically. Number of Addenda: 0 Note Initiated On: 12/21/2018 12:04 PM Estimated Blood Loss: Estimated blood loss: none.      [employer]

## 2018-12-21 NOTE — Progress Notes (Signed)
[place] [facility] [place] NAME: Christina French    MR#:  [id number]  DATE OF BIRTH:  [birth year]  SUBJECTIVE:  CHIEF COMPLAINT:   Chief Complaint  Patient presents with  . Rectal Bleeding  Patient seen and evaluated this morning Presented to emergency room for rectal bleed No new episodes of rectal bleeding No vomiting of blood Tolerated IV iron infusion well Hemoglobin has been maintained  REVIEW OF SYSTEMS:    ROS  CONSTITUTIONAL: No documented fever. Has fatigue, weakness. No weight gain, no weight loss.  EYES: No blurry or double vision.  ENT: No tinnitus. No postnasal drip. No redness of the oropharynx.  RESPIRATORY: No cough, no wheeze, no hemoptysis. No dyspnea.  CARDIOVASCULAR: No chest pain. No orthopnea. No palpitations. No syncope.  GASTROINTESTINAL: No nausea, no vomiting or diarrhea. No abdominal pain Had rectal bleed. GENITOURINARY: No dysuria or hematuria.  ENDOCRINE: No polyuria or nocturia. No heat or cold intolerance.  HEMATOLOGY: No anemia. No bruising. No bleeding.  INTEGUMENTARY: No rashes. No lesions.  MUSCULOSKELETAL: No arthritis. No swelling. No gout.  NEUROLOGIC: No numbness, tingling, or ataxia. No seizure-type activity.  PSYCHIATRIC: No anxiety. No insomnia. No ADD.   DRUG ALLERGIES:   Allergies  Allergen Reactions  . No Known Allergies     VITALS:  Blood pressure (!) 147/85, pulse 93, temperature 98.3 F (36.8 C), temperature source Oral, resp. rate 20, height 5\' 1"  (1.549 m), weight 92.3 kg, SpO2 100 %.  PHYSICAL EXAMINATION:   Physical Exam  GENERAL:  63 y.o.-year-old patient lying in the bed with no acute distress.  EYES: Pupils equal, round, reactive to light and accommodation. No scleral icterus. Extraocular muscles intact.  HEENT: Head atraumatic, normocephalic. Oropharynx and nasopharynx clear.  Pallor present. NECK:  Supple, no jugular venous distention. No thyroid enlargement, no tenderness.   LUNGS: Normal breath sounds bilaterally, no wheezing, rales, rhonchi. No use of accessory muscles of respiration.  CARDIOVASCULAR: S1, S2 normal. No murmurs, rubs, or gallops.  ABDOMEN: Soft, nontender, nondistended. Bowel sounds present. No organomegaly or mass.  EXTREMITIES: No cyanosis, clubbing or edema b/l.    NEUROLOGIC: Cranial nerves II through XII are intact. No focal Motor or sensory deficits b/l.   PSYCHIATRIC: The patient is alert and oriented x 3.  SKIN: No obvious rash, lesion, or ulcer.   LABORATORY PANEL:   CBC Recent Labs  Lab 12/20/18 0240  12/21/18 0658  WBC 8.4  --   --   HGB 10.7*   < > 10.3*  HCT 31.7*   < > 31.5*  PLT 174  --   --    < > = values in this interval not displayed.   ------------------------------------------------------------------------------------------------------------------ Chemistries  Recent Labs  Lab 12/20/18 0240  12/21/18 0117  NA 120*   < > 128*  K 3.5  --  3.8  CL 83*  --  94*  CO2 24  --  24  GLUCOSE 154*  --  127*  BUN 10  --  10  CREATININE 0.68  --  0.76  CALCIUM 9.6  --  9.0  AST 23  --   --   ALT 17  --   --   ALKPHOS 94  --   --   BILITOT 0.6  --   --    < > = values in this interval not displayed.   ------------------------------------------------------------------------------------------------------------------  Cardiac Enzymes No results for input(s): TROPONINI in the last 168 hours. ------------------------------------------------------------------------------------------------------------------  RADIOLOGY:  No results found.   ASSESSMENT AND PLAN:  63 year old female patient with history of seizure disorder, GI bleed, Barrett's esophagus, arthritis, hypertension, posttraumatic stress disorder, seizures currently under hospitalist service for rectal bleed  -Acute gastrointestinal bleeding Discussed patient to stop NSAID use Gastroenterology consult Endoscopy today Continue IV proton pump  inhibitor  -Hypovolemic hyponatremia Improving with IV fluids Current sodium level 128  -Anemia Monitor hemoglobin hematocrit If hemoglobin drops less than 7 PRBC transfusion  -Iron deficiency IV iron infusion tolerated well Iron supplements  -DVT prophylaxis sequential compression device to lower extremities  -Type 2 diabetes mellitus Diet controlled  -Hypertension Continue beta-blocker and hydralazine as needed for control of blood pressure   All the records are reviewed and case discussed with Care Management/Social Worker. Management plans discussed with the patient, family and they are in agreement.  CODE STATUS: Full code  DVT Prophylaxis: SCDs  TOTAL TIME TAKING CARE OF THIS PATIENT: 34 minutes.   POSSIBLE D/C IN 2 to 3 DAYS, DEPENDING ON CLINICAL CONDITION.  Saundra Shelling M.D on 12/21/2018 at 12:14 PM  Between 7am to 6pm - Pager - [phone]  After 6pm go to [email or web address] - password [email or web address] [place]  Office  [phone]  CC: Primary care physician; [name], MD  Note: This dictation was prepared with Dragon dictation along with smaller phrase technology. Any transcriptional errors that result from this process are unintentional.

## 2018-12-21 NOTE — Progress Notes (Addendum)
[name], MD [address], [place], [place], [place], [address] [address], [place], [place], [place], [address] Phone: [phone]  Fax: [phone]   Subjective: Pt only drank half her prep yesterday. And had emesis of some of it as well.    Objective: Exam: Vital signs in last 24 hours: Vitals:   12/20/18 2136 12/21/18 0500 12/21/18 0633 12/21/18 1059  BP: (!) 150/68  (!) 172/94 (!) 147/85  Pulse: 75  91 93  Resp: 20  20 20   Temp: 97.9 F (36.6 C)  98.2 F (36.8 C) 98.3 F (36.8 C)  TempSrc: Oral  Oral Oral  SpO2: 97%  100% 100%  Weight:  92.3 kg  92.3 kg  Height:       Weight change: 1.581 kg  Intake/Output Summary (Last 24 hours) at 12/21/2018 1129 Last data filed at 12/21/2018 [id number] Gross per 24 hour  Intake 2464.88 ml  Output 550 ml  Net 1914.88 ml    General: No acute distress, AAO x3 Abd: Soft, NT/ND, No HSM Skin: Warm, no rashes Neck: Supple, Trachea midline   Lab Results: Lab Results  Component Value Date   WBC 8.4 12/20/2018   HGB 10.3 (L) 12/21/2018   HCT 31.5 (L) 12/21/2018   MCV 74.1 (L) 12/20/2018   PLT 174 12/20/2018   Micro Results: Recent Results (from the past 240 hour(s))  SARS CORONAVIRUS 2 (TAT 6-12 HRS) Nasal Swab Aptima Multi Swab     Status: None   Collection Time: 12/20/18  5:32 AM   Specimen: Aptima Multi Swab; Nasal Swab  Result Value Ref Range Status   SARS Coronavirus 2 NEGATIVE NEGATIVE Final    Comment: (NOTE) SARS-CoV-2 target nucleic acids are NOT DETECTED. The SARS-CoV-2 RNA is generally detectable in upper and lower respiratory specimens during the acute phase of infection. Negative results do not preclude SARS-CoV-2 infection, do not rule out co-infections with other pathogens, and should not be used as the sole basis for treatment or other patient management decisions. Negative results must be combined with clinical observations, patient history, and epidemiological information. The  expected result is Negative. Fact Sheet for Patients: SugarRoll.be Fact Sheet for Healthcare Providers: https://www.woods-mathews.com/ This test is not yet approved or cleared by the [place] FDA and  has been authorized for detection and/or diagnosis of SARS-CoV-2 by FDA under an Emergency Use Authorization (EUA). This EUA will remain  in effect (meaning this test can be used) for the duration of the COVID-19 declaration under Section 56 4(b)(1) of the Act, 21 U.S.C. section 360bbb-3(b)(1), unless the authorization is terminated or revoked sooner. Performed at [facility], [place] [address] [place], [place] [address]    Studies/Results: No results found. Medications:  Scheduled Meds: . [MAR Hold] atenolol  50 mg Oral BID  . [MAR Hold] carbamazepine  300 mg Oral BID  . [MAR Hold] docusate sodium  100 mg Oral BID  . [MAR Hold] escitalopram  20 mg Oral Daily  . [MAR Hold] levETIRAcetam  500 mg Oral BID  . [MAR Hold] mirtazapine  30 mg Oral QHS  . [MAR Hold] pantoprazole (PROTONIX) IV  40 mg Intravenous Q12H  . [MAR Hold] traZODone  50 mg Oral QHS   Continuous Infusions: . sodium chloride 125 mL/hr at 12/21/18 1017   PRN Meds:.[MAR Hold] acetaminophen **OR** [MAR Hold] acetaminophen, [MAR Hold] ALPRAZolam, [MAR Hold] cyclobenzaprine, [MAR Hold] hydrALAZINE, [MAR Hold] ondansetron **OR** [MAR Hold] ondansetron (ZOFRAN) IV   Assessment: Active Problems:  Hematochezia    Plan: Will proceed with EGD today   She is willing to drink the rest of the prep today as long as she gets some nausea medication. Primary team to please order nausea meds PRN  Please administer more prep today and we will proceed with colonoscopy and possible capsule tomorrow  I have discussed alternative options, risks & benefits,  which include, but are not limited to, bleeding, infection, perforation, risk of small bowel capsule getting stuck and  needing surgery for removal, respiratory complication & drug reaction.  The patient agrees with this plan & written consent will be obtained.       LOS: 1 day   [name], MD 12/21/2018, 11:29 AM

## 2018-12-22 ENCOUNTER — Inpatient Hospital Stay: Payer: Medicare Other | Admitting: Anesthesiology

## 2018-12-22 ENCOUNTER — Encounter
Admission: EM | Disposition: A | Discharge status: Home / Self Care | Payer: Self-pay | Source: Home / Self Care | Attending: Internal Medicine

## 2018-12-22 ENCOUNTER — Encounter: Payer: Self-pay | Admitting: Gastroenterology

## 2018-12-22 DIAGNOSIS — K649 Unspecified hemorrhoids: Secondary | ICD-10-CM

## 2018-12-22 HISTORY — PX: COLONOSCOPY WITH PROPOFOL: SHX5780

## 2018-12-22 HISTORY — PX: GIVENS CAPSULE STUDY: SHX5432

## 2018-12-22 LAB — TYPE AND SCREEN
ABO/RH(D): O NEG
Antibody Screen: POSITIVE
Unit division: 0
Unit division: 0

## 2018-12-22 LAB — BPAM RBC
Blood Product Expiration Date: 202009062359
Blood Product Expiration Date: 202009062359
Unit Type and Rh: 9500
Unit Type and Rh: 9500

## 2018-12-22 LAB — BASIC METABOLIC PANEL
Anion gap: 13 (ref 5–15)
BUN: <5 mg/dL — ABNORMAL LOW (ref 8–23)
CO2: 21 mmol/L — ABNORMAL LOW (ref 22–32)
Calcium: 8.9 mg/dL (ref 8.9–10.3)
Chloride: 97 mmol/L — ABNORMAL LOW (ref 98–111)
Creatinine, Ser: 0.59 mg/dL (ref 0.44–1.00)
GFR calc Af Amer: >60 mL/min (ref >60)
GFR calc non Af Amer: >60 mL/min (ref >60)
Glucose, Bld: 155 mg/dL — ABNORMAL HIGH (ref 70–99)
Potassium: 3 mmol/L — ABNORMAL LOW (ref 3.5–5.1)
Sodium: 131 mmol/L — ABNORMAL LOW (ref 135–145)

## 2018-12-22 LAB — GLUCOSE, CAPILLARY
Glucose-Capillary: 154 mg/dL — ABNORMAL HIGH (ref 70–99)
Glucose-Capillary: 118 mg/dL — ABNORMAL HIGH (ref 70–99)

## 2018-12-22 SURGERY — COLONOSCOPY WITH PROPOFOL
Anesthesia: General

## 2018-12-22 MED ORDER — PHENYLEPHRINE HCL (PRESSORS) 10 MG/ML IV SOLN
INTRAVENOUS | Status: DC | PRN
  Administered 2018-12-22 (×2): 100 ug via INTRAVENOUS

## 2018-12-22 MED ORDER — LEVETIRACETAM IN NACL 500 MG/100ML IV SOLN
500.0000 mg | INTRAVENOUS | Status: AC
  Administered 2018-12-22: 16:00:00 500 mg via INTRAVENOUS
  Filled 2018-12-22: qty 100

## 2018-12-22 MED ORDER — POTASSIUM CHLORIDE CRYS ER 20 MEQ PO TBCR
40.0000 meq | EXTENDED_RELEASE_TABLET | Freq: Once | ORAL | Status: AC
  Administered 2018-12-22: 15:00:00 40 meq via ORAL
  Filled 2018-12-22: qty 2

## 2018-12-22 MED ORDER — PROPOFOL 500 MG/50ML IV EMUL
INTRAVENOUS | Status: AC
  Filled 2018-12-22: qty 50

## 2018-12-22 MED ORDER — PROPOFOL 500 MG/50ML IV EMUL
INTRAVENOUS | Status: DC | PRN
  Administered 2018-12-22: 175 ug/kg/min via INTRAVENOUS

## 2018-12-22 MED ORDER — LORAZEPAM 2 MG/ML IJ SOLN
INTRAMUSCULAR | Status: AC
  Administered 2018-12-22: 2 mg
  Filled 2018-12-22: qty 1

## 2018-12-22 MED ORDER — LIDOCAINE HCL (CARDIAC) PF 100 MG/5ML IV SOSY
PREFILLED_SYRINGE | INTRAVENOUS | Status: DC | PRN
  Administered 2018-12-22: 50 mg via INTRAVENOUS

## 2018-12-22 MED ORDER — LORAZEPAM 2 MG/ML IJ SOLN
1.0000 mg | Freq: Once | INTRAMUSCULAR | Status: AC
  Administered 2018-12-22: 1 mg via INTRAVENOUS

## 2018-12-22 MED ORDER — PROPOFOL 10 MG/ML IV BOLUS
INTRAVENOUS | Status: DC | PRN
  Administered 2018-12-22 (×2): 40 mg via INTRAVENOUS

## 2018-12-22 MED ORDER — SODIUM CHLORIDE 0.9 % IV SOLN
INTRAVENOUS | Status: DC | PRN
  Administered 2018-12-22: 16:00:00 250 mL via INTRAVENOUS

## 2018-12-22 MED ORDER — LABETALOL HCL 5 MG/ML IV SOLN
20.0000 mg | INTRAVENOUS | Status: DC | PRN
  Administered 2018-12-22: 20 mg via INTRAVENOUS

## 2018-12-22 MED ORDER — LORAZEPAM 2 MG/ML IJ SOLN
1.0000 mg | INTRAMUSCULAR | Status: DC | PRN
  Administered 2018-12-22 (×2): 1 mg via INTRAVENOUS
  Filled 2018-12-22: qty 1

## 2018-12-22 MED ORDER — LABETALOL HCL 5 MG/ML IV SOLN
INTRAVENOUS | Status: AC
  Filled 2018-12-22: qty 4

## 2018-12-22 NOTE — Anesthesia Postprocedure Evaluation (Signed)
Anesthesia Post Note  Patient: Christina French  Procedure(s) Performed: COLONOSCOPY WITH PROPOFOL (N/A ) GIVENS CAPSULE STUDY  Patient location during evaluation: Endoscopy Anesthesia Type: General Level of consciousness: awake and alert Pain management: pain level controlled Vital Signs Assessment: post-procedure vital signs reviewed and stable Respiratory status: spontaneous breathing, nonlabored ventilation, respiratory function stable and patient connected to nasal cannula oxygen Cardiovascular status: blood pressure returned to baseline and stable Postop Assessment: no apparent nausea or vomiting Anesthetic complications: no     Last Vitals:  Vitals:   12/22/18 1133 12/22/18 1143  BP: (!) 148/70 (!) 155/102  Pulse: 88 89  Resp: 18 (!) 21  Temp:    SpO2: 92% 94%    Last Pain:  Vitals:   12/22/18 1143  TempSrc:   PainSc: 0-No pain                 Inez Rosato S

## 2018-12-22 NOTE — Op Note (Signed)
[facility] Gastroenterology Patient Name: Christina French Procedure Date: 12/22/2018 10:15 AM MRN: [id number] Account #: [id number] Date of Birth: [birth year] Admit Type: Inpatient Age: 63 Room: [facility] ENDO ROOM 1 Gender: Female Note Status: Finalized Procedure:            Colonoscopy Indications:          Hematochezia Providers:            Averie Hornbaker B. Bonna Gains MD, MD Referring MD:         [name], MD (Referring MD) Medicines:            Monitored Anesthesia Care Complications:        No immediate complications. Procedure:            Pre-Anesthesia Assessment:                       - Prior to the procedure, a History and Physical was                        performed, and patient medications, allergies and                        sensitivities were reviewed. The patient's tolerance of                        previous anesthesia was reviewed.                       - The risks and benefits of the procedure and the                        sedation options and risks were discussed with the                        patient. All questions were answered and informed                        consent was obtained.                       - Patient identification and proposed procedure were                        verified prior to the procedure by the physician, the                        nurse, the anesthetist and the technician. The                        procedure was verified in the pre-procedure area in the                        procedure room in the endoscopy suite.                       - ASA Grade Assessment: II - A patient with mild                        systemic disease.                       -  After reviewing the risks and benefits, the patient                        was deemed in satisfactory condition to undergo the                        procedure.                       After obtaining informed consent, the colonoscope was                        passed under direct  vision. Throughout the procedure,                        the patient's blood pressure, pulse, and oxygen                        saturations were monitored continuously. The                        Colonoscope was introduced through the anus and                        advanced to the the cecum, identified by appendiceal                        orifice and ileocecal valve. The colonoscopy was                        performed with ease. The patient tolerated the                        procedure well. The quality of the bowel preparation                        was good. Findings:      The perianal and digital rectal examinations were normal.      The rectum, sigmoid colon, descending colon, transverse colon, ascending       colon and cecum appeared normal.      Non-bleeding internal hemorrhoids were found during retroflexion. Impression:           - The rectum, sigmoid colon, descending colon,                        transverse colon, ascending colon and cecum are normal.                       - Non-bleeding internal hemorrhoids.                       - No specimens collected.                       - No evidence of active or recent bleeding seen                        throughout the exam. Recommendation:       - NPO for 4 hours, then start clear liquid diet for the  rest of the day. Advance to solid food diet tomorrow                       - To visualize the small bowel, perform video capsule                        endoscopy today.                       - Continue present medications.                       - Return to primary care physician as previously                        scheduled.                       - The findings and recommendations were discussed with                        the patient.                       - Return to GI clinic in 4 weeks. Procedure Code(s):    --- Professional ---                       [phone], Colonoscopy, flexible; diagnostic, including                         collection of specimen(s) by brushing or washing, when                        performed (separate procedure) Diagnosis Code(s):    --- Professional ---                       K64.8, Other hemorrhoids                       K92.1, Melena (includes Hematochezia) CPT copyright 2019 American Medical Association. All rights reserved. The codes documented in this report are preliminary and upon coder review may  be revised to meet current compliance requirements.  [name], MD Margretta Sidle B. Bonna Gains MD, MD 12/22/2018 11:03:56 AM This report has been signed electronically. Number of Addenda: 0 Note Initiated On: 12/22/2018 10:15 AM Scope Withdrawal Time: 0 hours 8 minutes 49 seconds  Total Procedure Duration: 0 hours 20 minutes 14 seconds       [facility]

## 2018-12-22 NOTE — Progress Notes (Signed)
RN responded to pts room after pt called out reporting she was having a seizure. Rapid response called. VSS. Pt turned to her right side. Pt awake and responsive throughout episode. Pt able to follow verbal commands. PRN ativan given. Pt reports never feeling like this before. Dr. Estanislado Pandy called and ordered another 1 mg of ativan to be given IV keppra to be administered. Ativan administered. Pt awaiting the IV keppra. Pt still talking to RN, reporting feeling cold. Temperature is at 98 degrees.

## 2018-12-22 NOTE — Significant Event (Signed)
Rapid Response Event Note  Overview: Time Called: [id number] Arrival Time: [id number] Event Type: Other (Comment)(Hypertension; Tachycardia; Anxiety)  Initial Focused Assessment: RR called for pt with acute tachycardia, increased anxiety, and persistent hypertension despite given PRN meds.  Patient with history of seizures; stated she felt like she was going to "pass out"   Interventions: MD paged for new PRN meds for BP.   Plan of Care (if not transferred):  New orders placed.  Nurse to give 20mg  labatalol and PRN ativan; recheck BP after and call if further assistance is needed.  Will check back with patient.  Event Summary: Name of Physician Notified: Dr. Sidney Ace at 0700    at    Outcome: Stayed in room and stabalized  Event End Time: 0708  Christina French

## 2018-12-22 NOTE — Progress Notes (Signed)
[facility] [place] NAME: Christina French    MR#:  [id number]  DATE OF BIRTH:  10/22/1955  SUBJECTIVE:  CHIEF COMPLAINT:   Chief Complaint  Patient presents with  . Rectal Bleeding  Patient seen and evaluated this morning Presented to emergency room for rectal bleed No new episodes of rectal bleeding No vomiting of blood Patient says she had a seizure but no witness Tolerated IV iron infusion well Hemoglobin has been maintained well  REVIEW OF SYSTEMS:    ROS  CONSTITUTIONAL: No documented fever. Has fatigue, weakness. No weight gain, no weight loss.  EYES: No blurry or double vision.  ENT: No tinnitus. No postnasal drip. No redness of the oropharynx.  RESPIRATORY: No cough, no wheeze, no hemoptysis. No dyspnea.  CARDIOVASCULAR: No chest pain. No orthopnea. No palpitations. No syncope.  GASTROINTESTINAL: No nausea, no vomiting or diarrhea. No abdominal pain Had rectal bleed. GENITOURINARY: No dysuria or hematuria.  ENDOCRINE: No polyuria or nocturia. No heat or cold intolerance.  HEMATOLOGY: No anemia. No bruising. No bleeding.  INTEGUMENTARY: No rashes. No lesions.  MUSCULOSKELETAL: No arthritis. No swelling. No gout.  NEUROLOGIC: No numbness, tingling, or ataxia. No seizure-type activity.  PSYCHIATRIC: No anxiety. No insomnia. No ADD.   DRUG ALLERGIES:   Allergies  Allergen Reactions  . No Known Allergies     VITALS:  Blood pressure (!) 168/109, pulse 91, temperature 97.9 F (36.6 C), temperature source Tympanic, resp. rate 20, height 5\' 1"  (1.549 m), weight 93.4 kg, SpO2 96 %.  PHYSICAL EXAMINATION:   Physical Exam  GENERAL:  63 y.o.-year-old patient lying in the bed with no acute distress.  EYES: Pupils equal, round, reactive to light and accommodation. No scleral icterus. Extraocular muscles intact.  HEENT: Head atraumatic, normocephalic. Oropharynx and nasopharynx clear.  Pallor present. NECK:  Supple, no jugular  venous distention. No thyroid enlargement, no tenderness.  LUNGS: Normal breath sounds bilaterally, no wheezing, rales, rhonchi. No use of accessory muscles of respiration.  CARDIOVASCULAR: S1, S2 normal. No murmurs, rubs, or gallops.  ABDOMEN: Soft, nontender, nondistended. Bowel sounds present. No organomegaly or mass.  EXTREMITIES: No cyanosis, clubbing or edema b/l.    NEUROLOGIC: Cranial nerves II through XII are intact. No focal Motor or sensory deficits b/l.   PSYCHIATRIC: The patient is alert and oriented x 3.  SKIN: No obvious rash, lesion, or ulcer.   LABORATORY PANEL:   CBC Recent Labs  Lab 12/20/18 0240  12/21/18 0658  WBC 8.4  --   --   HGB 10.7*   < > 10.3*  HCT 31.7*   < > 31.5*  PLT 174  --   --    < > = values in this interval not displayed.   ------------------------------------------------------------------------------------------------------------------ Chemistries  Recent Labs  Lab 12/20/18 0240  12/22/18 0933  NA 120*   < > 131*  K 3.5   < > 3.0*  CL 83*   < > 97*  CO2 24   < > 21*  GLUCOSE 154*   < > 155*  BUN 10   < > <5*  CREATININE 0.68   < > 0.59  CALCIUM 9.6   < > 8.9  AST 23  --   --   ALT 17  --   --   ALKPHOS 94  --   --   BILITOT 0.6  --   --    < > = values in this interval not displayed.   ------------------------------------------------------------------------------------------------------------------  Cardiac Enzymes No results for input(s): TROPONINI in the last 168 hours. ------------------------------------------------------------------------------------------------------------------  RADIOLOGY:  No results found.   ASSESSMENT AND PLAN:  63 year old female patient with history of seizure disorder, GI bleed, Barrett's esophagus, arthritis, hypertension, posttraumatic stress disorder, seizures currently under hospitalist service for rectal bleed  -Acute gastrointestinal bleeding Discussed patient to stop NSAID  use Gastroenterology consult Endoscopy today Continue IV proton pump inhibitor  -Hypovolemic hyponatremia Improving every day Improving with IV fluids Current sodium level 131  -Acute hypokalemia Replace potassium  -Anemia Monitor hemoglobin hematocrit If hemoglobin drops less than 7 PRBC transfusion  -Iron deficiency IV iron infusion tolerated well Iron supplements  -DVT prophylaxis sequential compression device to lower extremities  -Type 2 diabetes mellitus Diet controlled  -Hypertension Continue beta-blocker and hydralazine as needed for control of blood pressure   All the records are reviewed and case discussed with Care Management/Social Worker. Management plans discussed with the patient, family and they are in agreement.  CODE STATUS: Full code  DVT Prophylaxis: SCDs  TOTAL TIME TAKING CARE OF THIS PATIENT: 35 minutes.   POSSIBLE D/C IN 2 to 3 DAYS, DEPENDING ON CLINICAL CONDITION.  Saundra Shelling M.D on 12/22/2018 at 10:49 AM  Between 7am to 6pm - Pager - [phone]  After 6pm go to [email or web address] - password [email or web address] [facility]  Office  [phone]  CC: Primary care physician; [name], MD  Note: This dictation was prepared with Dragon dictation along with smaller phrase technology. Any transcriptional errors that result from this process are unintentional.

## 2018-12-22 NOTE — Progress Notes (Signed)
[name], MD [address], [place], [place], [place], [address] [address], [place], [place], [place], [address] Phone: [phone]  Fax: [phone]   Subjective:  No further hematochezia. No abdominal pain  Objective: Exam: Vital signs in last 24 hours: Vitals:   12/22/18 0500 12/22/18 0650 12/22/18 0707 12/22/18 0707  BP:  (!) 179/93 (!) 179/85 (!) 179/85  Pulse:  (!) 118 (!) 113 (!) 113  Resp:  20    Temp:  97.7 F (36.5 C)    TempSrc:  Oral    SpO2:  98% 97% 97%  Weight: 93.4 kg     Height:       Weight change: 0 kg  Intake/Output Summary (Last 24 hours) at 12/22/2018 1006 Last data filed at 12/21/2018 1700 Gross per 24 hour  Intake 800 ml  Output -  Net 800 ml    General: No acute distress, AAO x3 Abd: Soft, NT/ND, No HSM Skin: Warm, no rashes Neck: Supple, Trachea midline   Lab Results: Lab Results  Component Value Date   WBC 8.4 12/20/2018   HGB 10.3 (L) 12/21/2018   HCT 31.5 (L) 12/21/2018   MCV 74.1 (L) 12/20/2018   PLT 174 12/20/2018   Micro Results: Recent Results (from the past 240 hour(s))  SARS CORONAVIRUS 2 (TAT 6-12 HRS) Nasal Swab Aptima Multi Swab     Status: None   Collection Time: 12/20/18  5:32 AM   Specimen: Aptima Multi Swab; Nasal Swab  Result Value Ref Range Status   SARS Coronavirus 2 NEGATIVE NEGATIVE Final    Comment: (NOTE) SARS-CoV-2 target nucleic acids are NOT DETECTED. The SARS-CoV-2 RNA is generally detectable in upper and lower respiratory specimens during the acute phase of infection. Negative results do not preclude SARS-CoV-2 infection, do not rule out co-infections with other pathogens, and should not be used as the sole basis for treatment or other patient management decisions. Negative results must be combined with clinical observations, patient history, and epidemiological information. The expected result is Negative. Fact Sheet for  Patients: SugarRoll.be Fact Sheet for Healthcare Providers: https://www.woods-mathews.com/ This test is not yet approved or cleared by the [place] FDA and  has been authorized for detection and/or diagnosis of SARS-CoV-2 by FDA under an Emergency Use Authorization (EUA). This EUA will remain  in effect (meaning this test can be used) for the duration of the COVID-19 declaration under Section 56 4(b)(1) of the Act, 21 U.S.C. section 360bbb-3(b)(1), unless the authorization is terminated or revoked sooner. Performed at [facility], [place] [address], [place], [place] [address]    Studies/Results: No results found. Medications:  Scheduled Meds: . atenolol  50 mg Oral BID  . carbamazepine  300 mg Oral BID  . docusate sodium  100 mg Oral BID  . escitalopram  20 mg Oral Daily  . labetalol      . levETIRAcetam  500 mg Oral BID  . mirtazapine  30 mg Oral QHS  . pantoprazole (PROTONIX) IV  40 mg Intravenous Q12H  . traZODone  50 mg Oral QHS   Continuous Infusions: . sodium chloride 125 mL/hr at 12/22/18 0620   PRN Meds:.acetaminophen **OR** acetaminophen, ALPRAZolam, cyclobenzaprine, hydrALAZINE, labetalol, LORazepam, ondansetron **OR** ondansetron (ZOFRAN) IV, sodium chloride   Assessment: Active Problems:   Hematochezia    Plan: Colonoscopy with possible small bowel capsule today Pt completed her prep  Hyponatremia improving.   I have discussed alternative options, risks & benefits,  which include, but are not  limited to, bleeding, infection, perforation, risk of capsule getting stuck and needing surgery for removal, respiratory complication & drug reaction.  The patient agrees with this plan & written consent will be obtained.     LOS: 2 days   [name], MD 12/22/2018, 10:06 AM

## 2018-12-22 NOTE — Transfer of Care (Signed)
Immediate Anesthesia Transfer of Care Note  Patient: Christina French  Procedure(s) Performed: COLONOSCOPY WITH PROPOFOL (N/A )  Patient Location: PACU  Anesthesia Type:General  Level of Consciousness: sedated  Airway & Oxygen Therapy: Patient Spontanous Breathing and Patient connected to nasal cannula oxygen  Post-op Assessment: Report given to RN and Post -op Vital signs reviewed and stable  Post vital signs: Reviewed and stable  Last Vitals:  Vitals Value Taken Time  BP 100/55 12/22/18 1103  Temp 36.3 C 12/22/18 1103  Pulse 72 12/22/18 1104  Resp 24 12/22/18 1104  SpO2 97 % 12/22/18 1104  Vitals shown include unvalidated device data.  Last Pain:  Vitals:   12/22/18 1103  TempSrc: Tympanic  PainSc:          Complications: No apparent anesthesia complications

## 2018-12-22 NOTE — OR Nursing (Signed)
Report given to [place] RN, reviewed [facility] capsule study instructions, NPO until 1430, then clear liquids until 1830, then light supper.

## 2018-12-22 NOTE — Anesthesia Post-op Follow-up Note (Signed)
Anesthesia QCDR form completed.        

## 2018-12-22 NOTE — Anesthesia Preprocedure Evaluation (Signed)
Anesthesia Evaluation  Patient identified by MRN, date of birth, ID band Patient awake    Reviewed: Allergy & Precautions, NPO status , Patient's Chart, lab work & pertinent test results, reviewed documented beta blocker date and time   Airway Mallampati: III  TM Distance: >3 FB     Dental  (+) Chipped   Pulmonary pneumonia, resolved,           Cardiovascular hypertension, Pt. on medications and Pt. on home beta blockers      Neuro/Psych Seizures -,  PSYCHIATRIC DISORDERS Anxiety Depression  Neuromuscular disease    GI/Hepatic GERD  ,  Endo/Other  diabetes, Type 2  Renal/GU Renal disease     Musculoskeletal  (+) Arthritis ,   Abdominal   Peds  Hematology  (+) anemia ,   Anesthesia Other Findings Obese.  Reproductive/Obstetrics                             Anesthesia Physical Anesthesia Plan  ASA: III  Anesthesia Plan: General   Post-op Pain Management:    Induction: Intravenous  PONV Risk Score and Plan:   Airway Management Planned:   Additional Equipment:   Intra-op Plan:   Post-operative Plan:   Informed Consent: I have reviewed the patients History and Physical, chart, labs and discussed the procedure including the risks, benefits and alternatives for the proposed anesthesia with the patient or authorized representative who has indicated his/her understanding and acceptance.       Plan Discussed with: CRNA  Anesthesia Plan Comments:         Anesthesia Quick Evaluation

## 2018-12-22 NOTE — Anesthesia Procedure Notes (Signed)
Date/Time: 12/22/2018 10:30 AM Performed by: Johnna Acosta, CRNA Pre-anesthesia Checklist: Patient identified, Emergency Drugs available, Suction available, Patient being monitored and Timeout performed Patient Re-evaluated:Patient Re-evaluated prior to induction Oxygen Delivery Method: Nasal cannula Preoxygenation: Pre-oxygenation with 100% oxygen

## 2018-12-22 NOTE — Progress Notes (Signed)
Patient called RN and stated she felt like she was about to have a seizure and that she was going to "black out" or "pass out". Patient complained of feeling numb and bothered by lights. BP was 179/85.  Rapid response called and MD notified. Patient given prn Ativan and Labetalol IV.

## 2018-12-23 ENCOUNTER — Encounter: Payer: Self-pay | Admitting: Gastroenterology

## 2018-12-23 ENCOUNTER — Telehealth (HOSPITAL_BASED_OUTPATIENT_CLINIC_OR_DEPARTMENT_OTHER): Payer: Medicare Other | Admitting: Gastroenterology

## 2018-12-23 DIAGNOSIS — K921 Melena: Secondary | ICD-10-CM

## 2018-12-23 DIAGNOSIS — D509 Iron deficiency anemia, unspecified: Secondary | ICD-10-CM

## 2018-12-23 LAB — BASIC METABOLIC PANEL
Anion gap: 11 (ref 5–15)
BUN: 9 mg/dL (ref 8–23)
CO2: 22 mmol/L (ref 22–32)
Calcium: 8.6 mg/dL — ABNORMAL LOW (ref 8.9–10.3)
Chloride: 95 mmol/L — ABNORMAL LOW (ref 98–111)
Creatinine, Ser: 0.82 mg/dL (ref 0.44–1.00)
GFR calc Af Amer: >60 mL/min (ref >60)
GFR calc non Af Amer: >60 mL/min (ref >60)
Glucose, Bld: 125 mg/dL — ABNORMAL HIGH (ref 70–99)
Potassium: 3.4 mmol/L — ABNORMAL LOW (ref 3.5–5.1)
Sodium: 128 mmol/L — ABNORMAL LOW (ref 135–145)

## 2018-12-23 MED ORDER — FERROUS SULFATE 325 (65 FE) MG PO TBEC: 325.0000 mg | DELAYED_RELEASE_TABLET | Freq: Two times a day (BID) | ORAL | 0 refills | Status: DC

## 2018-12-23 MED ORDER — POTASSIUM CHLORIDE CRYS ER 20 MEQ PO TBCR
40.0000 meq | EXTENDED_RELEASE_TABLET | Freq: Once | ORAL | Status: AC
  Administered 2018-12-23: 09:00:00 40 meq via ORAL
  Filled 2018-12-23: qty 2

## 2018-12-23 NOTE — Care Management Important Message (Signed)
Important Message  Patient Details  Name: Christina French MRN: [id number] Date of Birth: 03-21-1956   Medicare Important Message Given:  Yes     Dannette Barbara 12/23/2018, 10:53 AM

## 2018-12-23 NOTE — Progress Notes (Signed)
12/23/2018 12:53 PM  [name] to be D/C'd Home per MD order.  Discussed prescriptions and follow up appointments with the patient. Prescriptions given to patient, medication list explained in detail. Pt verbalized understanding.  Allergies as of 12/23/2018      Reactions   No Known Allergies       Medication List    TAKE these medications   acetaminophen 500 MG tablet Commonly known as: TYLENOL Take 1,000 mg by mouth 2 (two) times daily as needed for mild pain or headache.   ALPRAZolam 1 MG tablet Commonly known as: XANAX Take 1 tablet (1 mg total) by mouth 3 (three) times daily as needed for anxiety.   atenolol 50 MG tablet Commonly known as: TENORMIN Take 1 tablet (50 mg total) by mouth 2 (two) times daily.   carbamazepine 200 MG tablet Commonly known as: TEGRETOL Take one and one-half tablets (300 mg total) by mouth 2 (two) times daily. What changed: See the new instructions.   cyclobenzaprine 10 MG tablet Commonly known as: FLEXERIL Take 1 tablet (10 mg total) by mouth 3 (three) times daily as needed for muscle spasms.   escitalopram 20 MG tablet Commonly known as: LEXAPRO Take 20 mg by mouth daily.   ferrous sulfate 325 (65 FE) MG EC tablet Take 1 tablet (325 mg total) by mouth 2 (two) times daily.   levETIRAcetam 500 MG tablet Commonly known as: KEPPRA Take 500 mg by mouth 2 (two) times daily.   mirtazapine 15 MG tablet Commonly known as: REMERON Take 30 mg by mouth at bedtime.   omeprazole 40 MG capsule Commonly known as: PRILOSEC Take 1 capsule (40 mg total) by mouth 2 (two) times daily.   traZODone 50 MG tablet Commonly known as: DESYREL Take 50 mg by mouth at bedtime.       Vitals:   12/22/18 2057 12/23/18 0453  BP: 121/63 122/75  Pulse: (!) 105 90  Resp: 18 16  Temp: 98 F (36.7 C) 98.2 F (36.8 C)  SpO2: 98% 97%    Skin clean, dry and intact without evidence of skin break down, no evidence of skin tears noted. IV catheter  discontinued intact. Site without signs and symptoms of complications. Dressing and pressure applied. Pt denies pain at this time. No complaints noted.  An After Visit Summary was printed and given to the patient. Patient escorted via [place], and D/C home via private auto.  Dola Argyle ]

## 2018-12-23 NOTE — Discharge Summary (Addendum)
[facility] [place] NAME: Christina French    MR#:  [id number]  DATE OF BIRTH:  1956-01-03  DATE OF ADMISSION:  12/20/2018 ADMITTING PHYSICIAN: Harrie Foreman, MD  DATE OF DISCHARGE: 12/23/2018  PRIMARY CARE PHYSICIAN: [name], MD   ADMISSION DIAGNOSIS:  Hematochezia [K92.1] Hyponatremia [E87.1] Rectal bleeding [K62.5]  DISCHARGE DIAGNOSIS:  Active Problems:   Hematochezia Chronic hyponatremia Barrett's esophagus Hypokalemia Iron deficiency anemia Type 2 diabetes mellitus Hypertension  SECONDARY DIAGNOSIS:   Past Medical History:  Diagnosis Date  . Anemia   . Anxiety   . Arthritis   . Barrett esophagus   . Cancer ([place]) 2016    Skin cancer right leg mylenoma  . Depression   . Diabetes ([place]) 04/25/2017  . GERD (gastroesophageal reflux disease)   . History of blood transfusion    with a surgery  . History of bronchitis   . History of pneumonia   . Hypertension   . IBS (irritable bowel syndrome)   . Pneumonia 09/27/15  . PTSD (post-traumatic stress disorder)   . Renal insufficiency    reports acute kidney injury  . Seizures ([place])    rare - last one 07/30/14 - was very anemic.  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 Vertigo    thinks related to sinus issues     ADMITTING HISTORY The patient with past medical history of seizure disorder, GI bleed in the past, depression and diabetes presents to the emergency department on the insistence of her daughter who found the patient to have blood in her seat this evening.  The patient denies abdominal pain.  She was stable throughout emergency department evaluation.  She is given pantoprazole IV prior to the emergency department staff, hospitalist service for admission.  HOSPITAL COURSE:  Patient was admitted to medical floor.  She received IV proton pump inhibitor during the hospitalization.  Blood pressure was controlled with PRN IV hydralazine and oral beta-blocker.  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  Sodium responded to IV fluids and improved.  Patient continued Keppra and carbamazepine for seizure disorder.  She was evaluated by gastroenterology for hematochezia and worked up with endoscopy and colonoscopy.  Colonoscopy was a normal study.  Endoscopy showed a Barrett's esophagus corresponding to c4 prague criterion.  Continue proton pump inhibitor.  Hemoglobin has been stable.  CONSULTS OBTAINED:  Treatment Team:  Virgel Manifold, MD  DRUG ALLERGIES:   Allergies  Allergen Reactions  . No Known Allergies     DISCHARGE MEDICATIONS:   Allergies as of 12/23/2018      Reactions   No Known Allergies       Medication List    TAKE these medications   acetaminophen 500 MG tablet Commonly known as: TYLENOL Take 1,000 mg by mouth 2 (two) times daily as needed for mild pain or headache.   ALPRAZolam 1 MG tablet Commonly known as: XANAX Take 1 tablet (1 mg total) by mouth 3 (three) times daily as needed for anxiety.   atenolol 50 MG tablet Commonly known as: TENORMIN Take 1 tablet (50 mg total) by mouth 2 (two) times daily.   carbamazepine 200 MG tablet Commonly known as: TEGRETOL Take one and one-half tablets (300 mg total) by mouth 2 (two) times daily. What changed: See the new instructions.   cyclobenzaprine 10 MG tablet Commonly known as: FLEXERIL Take 1 tablet (10 mg total) by mouth 3 (three) times  daily as needed for muscle spasms.   escitalopram 20 MG tablet Commonly known as: LEXAPRO Take 20 mg by mouth daily.   ferrous sulfate 325 (65 FE) MG EC tablet Take 1 tablet (325 mg total) by mouth 2 (two) times daily.   levETIRAcetam 500 MG tablet Commonly known as: KEPPRA Take 500 mg by mouth 2 (two) times daily.   mirtazapine 15 MG tablet Commonly known as: REMERON Take 30 mg by mouth at bedtime.   omeprazole 40 MG capsule Commonly known as: PRILOSEC Take 1 capsule (40 mg total) by  mouth 2 (two) times daily.   traZODone 50 MG tablet Commonly known as: DESYREL Take 50 mg by mouth at bedtime.       Today  Patient seen today No seizures Tolerated diet well Hemodynamically stable VITAL SIGNS:  Blood pressure 122/75, pulse 90, temperature 98.2 F (36.8 C), temperature source Oral, resp. rate 16, height 5\' 1"  (1.549 m), weight 93.6 kg, SpO2 97 %.  I/O:    Intake/Output Summary (Last 24 hours) at 12/23/2018 1005 Last data filed at 12/23/2018 0900 Gross per 24 hour  Intake 1520 ml  Output 0 ml  Net 1520 ml    PHYSICAL EXAMINATION:  Physical Exam  GENERAL:  63 y.o.-year-old patient lying in the bed with no acute distress.  LUNGS: Normal breath sounds bilaterally, no wheezing, rales,rhonchi or crepitation. No use of accessory muscles of respiration.  CARDIOVASCULAR: S1, S2 normal. No murmurs, rubs, or gallops.  ABDOMEN: Soft, non-tender, non-distended. Bowel sounds present. No organomegaly or mass.  NEUROLOGIC: Moves all 4 extremities. PSYCHIATRIC: The patient is alert and oriented x 3.  SKIN: No obvious rash, lesion, or ulcer.   DATA REVIEW:   CBC Recent Labs  Lab 12/20/18 0240  12/21/18 0658  WBC 8.4  --   --   HGB 10.7*   < > 10.3*  HCT 31.7*   < > 31.5*  PLT 174  --   --    < > = values in this interval not displayed.    Chemistries  Recent Labs  Lab 12/20/18 0240  12/23/18 0548  NA 120*   < > 128*  K 3.5   < > 3.4*  CL 83*   < > 95*  CO2 24   < > 22  GLUCOSE 154*   < > 125*  BUN 10   < > 9  CREATININE 0.68   < > 0.82  CALCIUM 9.6   < > 8.6*  AST 23  --   --   ALT 17  --   --   ALKPHOS 94  --   --   BILITOT 0.6  --   --    < > = values in this interval not displayed.    Cardiac Enzymes No results for input(s): TROPONINI in the last 168 hours.  Microbiology Results  Results for orders placed or performed during the hospital encounter of 12/20/18  SARS CORONAVIRUS 2 (TAT 6-12 HRS) Nasal Swab Aptima Multi Swab     Status: None    Collection Time: 12/20/18  5:32 AM   Specimen: Aptima Multi Swab; Nasal Swab  Result Value Ref Range Status   SARS Coronavirus 2 NEGATIVE NEGATIVE Final    Comment: (NOTE) SARS-CoV-2 target nucleic acids are NOT DETECTED. The SARS-CoV-2 RNA is generally detectable in upper and lower respiratory specimens during the acute phase of infection. Negative results do not preclude SARS-CoV-2 infection, do not rule out co-infections with other pathogens, and  should not be used as the sole basis for treatment or other patient management decisions. Negative results must be combined with clinical observations, patient history, and epidemiological information. The expected result is Negative. Fact Sheet for Patients: SugarRoll.be Fact Sheet for Healthcare Providers: https://www.woods-mathews.com/ This test is not yet approved or cleared by the [place] FDA and  has been authorized for detection and/or diagnosis of SARS-CoV-2 by FDA under an Emergency Use Authorization (EUA). This EUA will remain  in effect (meaning this test can be used) for the duration of the COVID-19 declaration under Section 56 4(b)(1) of the Act, 21 U.S.C. section 360bbb-3(b)(1), unless the authorization is terminated or revoked sooner. Performed at [facility], [place] [address], [place], [place] [address]     RADIOLOGY:  No results found.  Follow up with PCP in 1 week.  Management plans discussed with the patient, family and they are in agreement.  CODE STATUS: Full code    Code Status Orders  (From admission, onward)         Start     Ordered   12/20/18 0800  Full code  Continuous     12/20/18 0759        Code Status History    Date Active Date Inactive Code Status Order ID Comments User Context   04/25/2017 1822 04/27/2017 1715 Full Code [id number]  Idelle Crouch, MD Inpatient   08/03/2016 2000 08/05/2016 2141 Full Code [id number]  Henreitta Leber,  MD Inpatient   11/18/2015 2219 11/22/2015 1231 Full Code [id number]  Neldon Newport Inpatient   03/19/2015 2138 03/23/2015 1918 Full Code [id number]  Meredith Pel, MD Inpatient   Advance Care Planning Activity      TOTAL TIME TAKING CARE OF THIS PATIENT ON DAY OF DISCHARGE: more than 34 minutes.   Saundra Shelling M.D on 12/23/2018 at 10:05 AM  Between 7am to 6pm - Pager - [phone]  After 6pm go to [email or web address] - password [email or web address] [facility]  Office  [phone]  CC: Primary care physician; [name], MD  Note: This dictation was prepared with Dragon dictation along with smaller phrase technology. Any transcriptional errors that result from this process are unintentional.

## 2018-12-23 NOTE — Telephone Encounter (Signed)
Small bowel capsule report sent for scanning.  No actively bleeding lesions seen throughout the exam.  Please see scanned report for details.  Likely source of her hematochezia was her internal hemorrhoids.  Patient should follow-up in GI clinic with Dr. Vicente Males, and consider banding with Dr. Marius Ditch

## 2018-12-26 ENCOUNTER — Telehealth: Payer: Self-pay

## 2018-12-26 NOTE — Telephone Encounter (Signed)
-----   Message from Christina Manifold, MD sent at 12/23/2018  3:35 PM EDT ----- Can you please call this patient and give her the small bowel capsule results as detailed below.  No active bleeding seen.  The source of her bleeding is likely her hemorrhoids.  If she is agreeable, set her up with Dr. Marius Ditch for banding, and she should have follow-up with Dr. Vicente Males in clinic as well.

## 2018-12-26 NOTE — Telephone Encounter (Signed)
Called and left  A message for call back to go over the lab results. Tried the mobile number and the number was disconnected.

## 2018-12-26 NOTE — Telephone Encounter (Signed)
Patient states she is still very weak and has to get strength back before she can do anything. Patient states that she would really like staples instead of banding if that is possible.

## 2018-12-26 NOTE — Telephone Encounter (Signed)
Patient states that she will do a virtual visit with the provider. Transferred patient up front to make the appointment.

## 2018-12-27 ENCOUNTER — Telehealth: Payer: Self-pay | Admitting: Gastroenterology

## 2018-12-27 ENCOUNTER — Other Ambulatory Visit: Payer: Self-pay

## 2018-12-27 ENCOUNTER — Encounter: Payer: Self-pay | Admitting: Gastroenterology

## 2018-12-27 ENCOUNTER — Ambulatory Visit (INDEPENDENT_AMBULATORY_CARE_PROVIDER_SITE_OTHER): Payer: Medicare Other | Admitting: Gastroenterology

## 2018-12-27 DIAGNOSIS — Z8719 Personal history of other diseases of the digestive system: Secondary | ICD-10-CM | POA: Diagnosis not present

## 2018-12-27 DIAGNOSIS — K648 Other hemorrhoids: Secondary | ICD-10-CM

## 2018-12-27 NOTE — Telephone Encounter (Signed)
Per Dr. Bonna Gains schedule Banding #1 with Dr. Marius Ditch and schedule f/y with Dr. Vicente Males for for Barrett's esophagus Pt will call us back to schedule this

## 2018-12-27 NOTE — Progress Notes (Signed)
[name], MD [address]  [place]  [place], [place] [address]  Main: [phone]  Fax: [phone]   Primary Care Physician: [name], MD  Virtual Visit via Telephone Note  I connected with patient on 12/27/18 at  2:15 PM EDT by telephone and verified that I am speaking with the correct person using two identifiers.   I discussed the limitations, risks, security and privacy concerns of performing an evaluation and management service by telephone and the availability of in person appointments. I also discussed with the patient that there may be a patient responsible charge related to this service. The patient expressed understanding and agreed to proceed.  Location of Patient: Home Location of Provider: Home Persons involved: Patient and provider only during the visit (nursing staff and front desk staff was involved in communicating with the patient prior to the appointment, reviewing medications and checking them in)   History of Present Illness: Chief Complaint  Patient presents with  . Hemorrhoids    Patient wants to talk about other options instead of the banding for her hemorrhoids      HPI: Christina French is a 63 y.o. female here for hospital follow-up with hematochezia.  She states she is much better since hospitalization and her energy level is improved.  However, does report intermittent blood per rectum, about every other day.  Is using ClearLax and this has softened her stools and so the blood per rectum is less.  States it is minute in quantity, with small streaks in the stool only and not as much as it was when she was in the hospital.  Current Outpatient Medications  Medication Sig Dispense Refill  . acetaminophen (TYLENOL) 500 MG tablet Take 1,000 mg by mouth 2 (two) times daily as needed for mild pain or headache.    . ALPRAZolam (XANAX) 1 MG tablet Take 1 tablet (1 mg total) by mouth 3 (three) times daily as needed for anxiety. 90 tablet 3   . atenolol (TENORMIN) 50 MG tablet Take 1 tablet (50 mg total) by mouth 2 (two) times daily. 60 tablet 5  . carbamazepine (TEGRETOL) 200 MG tablet Take one and one-half tablets (300 mg total) by mouth 2 (two) times daily. (Patient taking differently: Take 300 mg by mouth 2 (two) times daily. ) 90 tablet 0  . cyclobenzaprine (FLEXERIL) 10 MG tablet Take 1 tablet (10 mg total) by mouth 3 (three) times daily as needed for muscle spasms. 90 tablet 3  . escitalopram (LEXAPRO) 20 MG tablet Take 20 mg by mouth daily.     . ferrous sulfate 325 (65 FE) MG EC tablet Take 1 tablet (325 mg total) by mouth 2 (two) times daily. 60 tablet 0  . levETIRAcetam (KEPPRA) 500 MG tablet Take 500 mg by mouth 2 (two) times daily.    . mirtazapine (REMERON) 15 MG tablet Take 30 mg by mouth at bedtime.     Marland Kitchen omeprazole (PRILOSEC) 40 MG capsule Take 1 capsule (40 mg total) by mouth 2 (two) times daily. 180 capsule 1  . traZODone (DESYREL) 50 MG tablet Take 50 mg by mouth at bedtime.     No current facility-administered medications for this visit.     Allergies as of 12/27/2018 - Review Complete 12/27/2018  Allergen Reaction Noted  . No known allergies  11/17/2015    Review of Systems:    All systems reviewed and negative except where noted in HPI.   Observations/Objective:  Labs: CMP  Component Value Date/Time   NA 128 (L) 12/23/2018 0548   NA 133 (L) 05/27/2012 1035   K 3.4 (L) 12/23/2018 0548   K 4.6 05/27/2012 1035   CL 95 (L) 12/23/2018 0548   CL 98 05/27/2012 1035   CO2 22 12/23/2018 0548   CO2 26 05/27/2012 1035   GLUCOSE 125 (H) 12/23/2018 0548   GLUCOSE 100 (H) 05/27/2012 1035   BUN 9 12/23/2018 0548   BUN 15 05/27/2012 1035   CREATININE 0.82 12/23/2018 0548   CREATININE 0.77 02/18/2015 1121   CALCIUM 8.6 (L) 12/23/2018 0548   CALCIUM 9.0 05/27/2012 1035   PROT 7.6 12/20/2018 0240   PROT 8.3 (H) 05/27/2012 1035   ALBUMIN 4.8 12/20/2018 0240   ALBUMIN 4.1 05/27/2012 1035   AST 23  12/20/2018 0240   AST 32 05/27/2012 1035   ALT 17 12/20/2018 0240   ALT 29 05/27/2012 1035   ALKPHOS 94 12/20/2018 0240   ALKPHOS 110 05/27/2012 1035   BILITOT 0.6 12/20/2018 0240   BILITOT 0.4 05/27/2012 1035   GFRNONAA >60 12/23/2018 0548   GFRNONAA 55 (L) 08/24/2012 1157   GFRAA >60 12/23/2018 0548   GFRAA >60 08/24/2012 1157   Lab Results  Component Value Date   WBC 8.4 12/20/2018   HGB 10.3 (L) 12/21/2018   HCT 31.5 (L) 12/21/2018   MCV 74.1 (L) 12/20/2018   PLT 174 12/20/2018    Imaging Studies: No results found.  Assessment and Plan:   Christina French is a 63 y.o. y/o female here for hospital follow-up of hematochezia  Assessment and Plan: I have discussed with her her EGD, colonoscopy and small bowel capsule findings  her hematochezia is likely due to her large internal hemorrhoids and I have discussed several options, including steroid suppositories, banding, surgery referral, and high-fiber diet.  Given her multiple episodes of GI bleeding and hospitalization for the same in the past, with the only finding being internal hemorrhoids, and her internal hemorrhoids being large, it is best to try to eradicate these hemorrhoids at this time to prevent future episodes of bleeding, and along with continuing a high-fiber diet as well.  Patient is interested in banding  We will refer to Dr. Marius Ditch  However, patient states she will call us back with the dates that she is available to come in for the banding as her family drives her there  If symptoms worsen I have asked her to notify us and she verbalized understanding  I have asked her to make this appointment as soon as possible and she verbalized understanding  I have encouraged her to continue to take her ClearLax to avoid constipation  Follow Up Instructions: Follow-up with Dr. Vicente Males for history of Barrett's   I discussed the assessment and treatment plan with the patient. The patient was provided an opportunity to  ask questions and all were answered. The patient agreed with the plan and demonstrated an understanding of the instructions.   The patient was advised to call back or seek an in-person evaluation if the symptoms worsen or if the condition fails to improve as anticipated.  I provided 30 minutes of non-face-to-face time during this encounter. Additional time was spent in reviewing patient's chart, placing orders etc.   Virgel Manifold, MD  Speech recognition software was used to dictate this note.

## 2019-01-03 ENCOUNTER — Ambulatory Visit: Payer: Medicare Other | Admitting: Adult Health

## 2019-01-10 ENCOUNTER — Other Ambulatory Visit (HOSPITAL_COMMUNITY): Payer: Self-pay | Admitting: Neurology

## 2019-01-10 ENCOUNTER — Other Ambulatory Visit: Payer: Self-pay | Admitting: Neurology

## 2019-01-10 DIAGNOSIS — Z79899 Other long term (current) drug therapy: Secondary | ICD-10-CM | POA: Diagnosis not present

## 2019-01-10 DIAGNOSIS — G40219 Localization-related (focal) (partial) symptomatic epilepsy and epileptic syndromes with complex partial seizures, intractable, without status epilepticus: Secondary | ICD-10-CM

## 2019-01-20 ENCOUNTER — Other Ambulatory Visit: Payer: Self-pay

## 2019-01-20 ENCOUNTER — Ambulatory Visit
Admission: RE | Admit: 2019-01-20 | Discharge: 2019-01-20 | Disposition: A | Discharge status: Home / Self Care | Payer: Medicare Other | Source: Ambulatory Visit | Attending: Neurology | Admitting: Neurology

## 2019-01-20 DIAGNOSIS — G40219 Localization-related (focal) (partial) symptomatic epilepsy and epileptic syndromes with complex partial seizures, intractable, without status epilepticus: Secondary | ICD-10-CM | POA: Insufficient documentation

## 2019-01-20 DIAGNOSIS — R569 Unspecified convulsions: Secondary | ICD-10-CM | POA: Diagnosis not present

## 2019-01-20 MED ORDER — GADOBUTROL 1 MMOL/ML IV SOLN
9.0000 mL | Freq: Once | INTRAVENOUS | Status: AC | PRN
  Administered 2019-01-20: 9 mL via INTRAVENOUS

## 2019-01-25 ENCOUNTER — Ambulatory Visit: Payer: Medicare Other | Admitting: Adult Health

## 2019-01-30 ENCOUNTER — Telehealth: Payer: Self-pay | Admitting: Gastroenterology

## 2019-01-30 NOTE — Telephone Encounter (Signed)
Pt left vm she lost her schedule for October for her hemorroid surgery please call pt with dates

## 2019-02-02 ENCOUNTER — Encounter: Payer: Self-pay | Admitting: Nurse Practitioner

## 2019-02-02 ENCOUNTER — Other Ambulatory Visit: Payer: Self-pay

## 2019-02-02 ENCOUNTER — Ambulatory Visit (INDEPENDENT_AMBULATORY_CARE_PROVIDER_SITE_OTHER): Payer: Medicare Other | Admitting: Nurse Practitioner

## 2019-02-02 VITALS — BP 150/99 | HR 80 | Temp 97.5°F | Resp 16 | Ht 61.0 in | Wt 200.0 lb

## 2019-02-02 DIAGNOSIS — I10 Essential (primary) hypertension: Secondary | ICD-10-CM | POA: Diagnosis not present

## 2019-02-02 DIAGNOSIS — G40909 Epilepsy, unspecified, not intractable, without status epilepticus: Secondary | ICD-10-CM

## 2019-02-02 DIAGNOSIS — M5440 Lumbago with sciatica, unspecified side: Secondary | ICD-10-CM

## 2019-02-02 MED ORDER — CYCLOBENZAPRINE HCL 10 MG PO TABS: 10.0000 mg | ORAL_TABLET | Freq: Three times a day (TID) | ORAL | 3 refills | Status: DC | PRN

## 2019-02-02 NOTE — Progress Notes (Signed)
[facility] [place], [place] [address]  Internal MEDICINE  Office Visit Note  Patient Name: Christina French  [id number]  [id number]  Date of Service: 02/12/2019  Chief Complaint  Patient presents with  . Hypertension  . Gastroesophageal Reflux  . Medication Refill    need refill on flexeril    The patient is here for routine follow up. She does suffer from chronic muscle pain and spasms. She relies on flexeril to help reduce the pain and keep her active. She needs to have refills for this. She sees psychiatry for depression and anxiety. Sees neurology for chronic seizure disorder. Medications are stable and no changes have been made in some time.         Current Medication: Outpatient Encounter Medications as of 02/02/2019  Medication Sig  . acetaminophen (TYLENOL) 500 MG tablet Take 1,000 mg by mouth 2 (two) times daily as needed for mild pain or headache.  . ALPRAZolam (XANAX) 1 MG tablet Take 1 tablet (1 mg total) by mouth 3 (three) times daily as needed for anxiety.  Marland Kitchen atenolol (TENORMIN) 50 MG tablet Take 1 tablet (50 mg total) by mouth 2 (two) times daily.  . carbamazepine (TEGRETOL) 200 MG tablet Take one and one-half tablets (300 mg total) by mouth 2 (two) times daily. (Patient taking differently: Take 300 mg by mouth 2 (two) times daily. )  . cyclobenzaprine (FLEXERIL) 10 MG tablet Take 1 tablet (10 mg total) by mouth 3 (three) times daily as needed for muscle spasms.  Marland Kitchen escitalopram (LEXAPRO) 20 MG tablet Take 20 mg by mouth daily.   . Lacosamide (VIMPAT) 100 MG TABS Take by mouth. Week 1: 1/2 tab every 12 hours Week 2: 1/2 tab in am and 1 tab at night Week 3: (start on 02/05/2019) 1 tab every 12 hours Week 4:1 tab in am and 1.5 tab every 12 hours  . levETIRAcetam (KEPPRA) 500 MG tablet Take 500 mg by mouth 2 (two) times daily.  . mirtazapine (REMERON) 15 MG tablet Take 30 mg by mouth at bedtime.   Marland Kitchen omeprazole (PRILOSEC) 40 MG capsule Take 1  capsule (40 mg total) by mouth 2 (two) times daily.  . traZODone (DESYREL) 50 MG tablet Take 50 mg by mouth at bedtime.  . [DISCONTINUED] cyclobenzaprine (FLEXERIL) 10 MG tablet Take 1 tablet (10 mg total) by mouth 3 (three) times daily as needed for muscle spasms.  . ferrous sulfate 325 (65 FE) MG EC tablet Take 1 tablet (325 mg total) by mouth 2 (two) times daily.   No facility-administered encounter medications on file as of 02/02/2019.     Surgical History: Past Surgical History:  Procedure Laterality Date  . ABDOMINAL HYSTERECTOMY    . CESAREAN SECTION    . COLONOSCOPY N/A 08/31/2014   Procedure: COLONOSCOPY;  Surgeon: Lucilla Lame, MD;  Location: [facility];  Service: Gastroenterology;  Laterality: N/A;  . COLONOSCOPY N/A 08/05/2016   Procedure: COLONOSCOPY;  Surgeon: [name], MD;  Location: [facility] ENDOSCOPY;  Service: Endoscopy;  Laterality: N/A;  . COLONOSCOPY WITH PROPOFOL N/A 12/22/2018   Procedure: COLONOSCOPY WITH PROPOFOL;  Surgeon: Virgel Manifold, MD;  Location: [facility] ENDOSCOPY;  Service: Endoscopy;  Laterality: N/A;  . CYST EXCISION  2011   from throat  . ESOPHAGOGASTRODUODENOSCOPY N/A 08/31/2014   Procedure: ESOPHAGOGASTRODUODENOSCOPY (EGD);  Surgeon: Lucilla Lame, MD;  Location: [facility];  Service: Gastroenterology;  Laterality: N/A;  . ESOPHAGOGASTRODUODENOSCOPY N/A 12/21/2018   Procedure: ESOPHAGOGASTRODUODENOSCOPY (EGD);  Surgeon: Virgel Manifold, MD;  Location[facility] END[facility]COPY;  Service: Endoscopy;  Laterality: N/A;  . ESOPHAGOGASTRODUODENOSCOPY (EGD) WITH PROPOFOL N/A 07/01/2017   Procedure: ESOPHAGOGASTRODUODENOSCOPY (EGD) WITH PROPOFOL;  Surgeon: [name], MD;  Location: [facility] ENDOSCOPY;  Service: Gastroenterology;  Laterality: N/A;  . FLEXIBLE SIGMOIDOSCOPY N/A 07/01/2017   Procedure: FLEXIBLE SIGMOIDOSCOPY;  Surgeon: [name], MD;  Location: [facility] ENDOSCOPY;  Service: Gastroenterology;  Laterality: N/A;  . GIVENS CAPSULE STUDY  12/22/2018    Procedure: GIVENS CAPSULE STUDY;  Surgeon: Virgel Manifold, MD;  Location: [facility] ENDOSCOPY;  Service: Endoscopy;;  . HEMORRHOID SURGERY    . INCISION AND DRAINAGE HIP Right 11/25/2014   Procedure: IRRIGATION  AND DRAINAGEOF RIGHT HIP WITH PLACEMENT OF ANTIBIOUTIC BEADS;  Surgeon: Mcarthur Rossetti, MD;  Location: [facility];  Service: Orthopedics;  Laterality: Right;  . INCISION AND DRAINAGE HIP Right 11/28/2014   Procedure: Repeat I&D Right Hip;  Surgeon: Mcarthur Rossetti, MD;  Location: [facility];  Service: Orthopedics;  Laterality: Right;  . JOINT REPLACEMENT Right 2007   hip  . TOTAL HIP REVISION Right 03/19/2015   Procedure: RIGHT TOTAL HIP ARTHROPLASTY REVISION;  Surgeon: Meredith Pel, MD;  Location: [facility];  Service: Orthopedics;  Laterality: Right;  . TOTAL HIP REVISION Right 11/18/2015   Procedure: TOTAL HIP REVISION;  Surgeon: Frederik Pear, MD;  Location: [facility];  Service: Orthopedics;  Laterality: Right;    Medical History: Past Medical History:  Diagnosis Date  . Anemia   . Anxiety   . Arthritis   . Barrett esophagus   . Cancer ([place]) 2016    Skin cancer right leg mylenoma  . Depression   . Diabetes ([place]) 04/25/2017  . GERD (gastroesophageal reflux disease)   . History of blood transfusion    with a surgery  . History of bronchitis   . History of pneumonia   . Hypertension   . IBS (irritable bowel syndrome)   . Pneumonia 09/27/15  . PTSD (post-traumatic stress disorder)   . Renal insufficiency    reports acute kidney injury  . Seizures ([place])    rare - last one 07/30/14 - was very anemic.  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 Vertigo    thinks related to sinus issues    Family History: Family History  Problem Relation Age of Onset  . Alcoholism Father   . Throat cancer Mother     Social History   Socioeconomic History  . Marital status: Single    Spouse name: Not on file  . Number of children: Not on file  . Years of education: Not on file  . Highest  education level: Not on file  Occupational History  . Not on file  Social Needs  . Financial resource strain: Not on file  . Food insecurity    Worry: Not on file    Inability: Not on file  . Transportation needs    Medical: Not on file    Non-medical: Not on file  Tobacco Use  . Smoking status: Never Smoker  . Smokeless tobacco: Never Used  Substance and Sexual Activity  . Alcohol use: No    Alcohol/week: 0.0 standard drinks  . Drug use: No  . Sexual activity: Not Currently  Lifestyle  . Physical activity    Days per week: Not on file    Minutes per session: Not on file  . Stress: Not on file  Relationships  . Social Herbalist on phone: Not  on file    Gets together: Not on file    Attends religious service: Not on file    Active member of club or organization: Not on file    Attends meetings of clubs or organizations: Not on file    Relationship status: Not on file  . Intimate partner violence    Fear of current or ex partner: Not on file    Emotionally abused: Not on file    Physically abused: Not on file    Forced sexual activity: Not on file  Other Topics Concern  . Not on file  Social History Narrative  . Not on file      Review of Systems  Constitutional: Negative for activity change, chills, fatigue and unexpected weight change.  HENT: Negative for congestion, postnasal drip, rhinorrhea, sneezing and sore throat.   Respiratory: Negative for cough, chest tightness and shortness of breath.   Cardiovascular: Negative for chest pain and palpitations.       Elevated blood pressures. Generally elevated when arrives in the office. Will come down after several minutes .  Gastrointestinal: Negative for abdominal pain, constipation, diarrhea, nausea and vomiting.  Endocrine: Negative for cold intolerance, heat intolerance, polydipsia and polyuria.       Thyroid panel abnormal when labs were checked in 03/2018.   Musculoskeletal: Positive for back pain and  myalgias. Negative for arthralgias, joint swelling and neck pain.       Chronic back pain, radiates to both hips and down the upper legs .  Skin: Negative for rash.  Allergic/Immunologic: Negative for environmental allergies.  Neurological: Positive for seizures and numbness. Negative for tremors.       Well managed on current medications.   Hematological: Negative for adenopathy. Does not bruise/bleed easily.  Psychiatric/Behavioral: Negative for behavioral problems (Depression), sleep disturbance and suicidal ideas. The patient is not nervous/anxious.     Today's Vitals   02/02/19 1634  BP: (!) 150/99  Pulse: 80  Resp: 16  Temp: (!) 97.5 F (36.4 C)  SpO2: 99%  Weight: 200 lb (90.7 kg)  Height: 5\' 1"  (1.549 m)   Body mass index is 37.79 kg/m.  Physical Exam Vitals signs and nursing note reviewed.  Constitutional:      General: She is not in acute distress.    Appearance: Normal appearance. She is well-developed. She is not diaphoretic.  HENT:     Head: Normocephalic and atraumatic.     Mouth/Throat:     Pharynx: No oropharyngeal exudate.  Eyes:     Pupils: Pupils are equal, round, and reactive to light.  Neck:     Musculoskeletal: Normal range of motion and neck supple.     Thyroid: No thyromegaly.     Vascular: No carotid bruit or JVD.     Trachea: No tracheal deviation.  Cardiovascular:     Rate and Rhythm: Normal rate and regular rhythm.     Heart sounds: Normal heart sounds. No murmur. No friction rub. No gallop.   Pulmonary:     Effort: Pulmonary effort is normal. No respiratory distress.     Breath sounds: Normal breath sounds. No wheezing or rales.  Chest:     Chest wall: No tenderness.  Abdominal:     Palpations: Abdomen is soft.  Musculoskeletal:     Comments: Moderate and intermittent low back pain. Worse with bending and twisting at the waist. Radiates into both hips and thighs.  Lymphadenopathy:     Cervical: No cervical adenopathy.  Skin:  General: Skin is warm and dry.  Neurological:     Mental Status: She is alert and oriented to person, place, and time. Mental status is at baseline.     Cranial Nerves: No cranial nerve deficit.  Psychiatric:        Behavior: Behavior normal.        Thought Content: Thought content normal.        Judgment: Judgment normal.   Assessment/Plan: 1. Essential hypertension Generally stable. Continue bp medication as prescribed .  2. Low back pain of thoracolumbar region with sciatica May continue to take cyclobenzaprine up to three times daily if needed for muscle pain/spasms. Refills provided today  - cyclobenzaprine (FLEXERIL) 10 MG tablet; Take 1 tablet (10 mg total) by mouth 3 (three) times daily as needed for muscle spasms.  Dispense: 90 tablet; Refill: 3  3. Seizure disorder ([place]) conitnue regular visits with neurology as scheduled   General Counseling: theria gonalez understanding of the findings of todays visit and agrees with plan of treatment. I have discussed any further diagnostic evaluation that may be needed or ordered today. We also reviewed her medications today. she has been encouraged to call the office with any questions or concerns that should arise related to todays visit.   This patient was seen by [place] with Dr [name] as a part of collaborative care agreement  Meds ordered this encounter  Medications  . cyclobenzaprine (FLEXERIL) 10 MG tablet    Sig: Take 1 tablet (10 mg total) by mouth 3 (three) times daily as needed for muscle spasms.    Dispense:  90 tablet    Refill:  3    Order Specific Question:   Supervising Provider    Answer:   [name] [id number]    Time spent: 64 Minutes      Dr [name] Internal medicine

## 2019-02-06 ENCOUNTER — Telehealth: Payer: Self-pay | Admitting: Gastroenterology

## 2019-02-06 NOTE — Telephone Encounter (Signed)
Patient called & l/m on v/m stating she was still bleeding. Please call.

## 2019-02-06 NOTE — Telephone Encounter (Signed)
Patient states since her last appointment she has had rectal bleeding from her internal hemorrhoids. Patient state the bleeding is anytime she goes to the bathroom to urinate or have a bowel movement. Patient states the blood is on the toilet paper and has blood in the toilet. Patient denies any abdominal pain, diarrhea, or constipation. Patient made a follow up appointment on 03/09/19 which is the first available appointment. Patient wants to know what she needs to do In the mean time.

## 2019-02-10 DIAGNOSIS — I639 Cerebral infarction, unspecified: Secondary | ICD-10-CM | POA: Diagnosis not present

## 2019-02-10 DIAGNOSIS — Z79899 Other long term (current) drug therapy: Secondary | ICD-10-CM | POA: Diagnosis not present

## 2019-02-10 DIAGNOSIS — Z86718 Personal history of other venous thrombosis and embolism: Secondary | ICD-10-CM | POA: Diagnosis not present

## 2019-02-10 DIAGNOSIS — R739 Hyperglycemia, unspecified: Secondary | ICD-10-CM | POA: Diagnosis not present

## 2019-02-13 NOTE — Telephone Encounter (Signed)
Tried to call patient but number kept ringing. Can you set up a appointment with Dr. Marius Ditch for Banding.

## 2019-02-20 ENCOUNTER — Ambulatory Visit: Payer: Self-pay | Admitting: Nurse Practitioner

## 2019-02-22 ENCOUNTER — Telehealth: Payer: Self-pay

## 2019-02-22 ENCOUNTER — Other Ambulatory Visit: Payer: Self-pay | Admitting: Nurse Practitioner

## 2019-02-22 DIAGNOSIS — M792 Neuralgia and neuritis, unspecified: Secondary | ICD-10-CM

## 2019-02-22 MED ORDER — GABAPENTIN 100 MG PO CAPS: 100.0000 mg | ORAL_CAPSULE | Freq: Two times a day (BID) | ORAL | 3 refills | Status: DC

## 2019-02-22 NOTE — Telephone Encounter (Signed)
Pt advised we send gabapentin to [facility] and see other note

## 2019-02-22 NOTE — Progress Notes (Signed)
Start gabapentin 100mg . I recommend she take in evenings ony for first five to seven days. May add second dose in the mornings/aternoons of 100mg  as needed and If she tolerates initial dosing well. Sent new prescription to [facility].

## 2019-02-22 NOTE — Telephone Encounter (Signed)
Start gabapentin 100mg . I recommend she take in evenings ony for first five to seven days. May add second dose in the mornings/aternoons of 100mg  as needed and If she tolerates initial dosing well. Sent new prescription to [facility].

## 2019-02-22 NOTE — Telephone Encounter (Signed)
SEND MESSAGE

## 2019-02-23 ENCOUNTER — Other Ambulatory Visit: Payer: Self-pay

## 2019-02-23 ENCOUNTER — Encounter: Payer: Self-pay | Admitting: Gastroenterology

## 2019-02-23 ENCOUNTER — Ambulatory Visit (INDEPENDENT_AMBULATORY_CARE_PROVIDER_SITE_OTHER): Payer: Medicare Other | Admitting: Gastroenterology

## 2019-02-23 VITALS — BP 138/88 | HR 107 | Temp 98.1°F | Wt 204.2 lb

## 2019-02-23 DIAGNOSIS — K648 Other hemorrhoids: Secondary | ICD-10-CM

## 2019-02-23 DIAGNOSIS — K625 Hemorrhage of anus and rectum: Secondary | ICD-10-CM

## 2019-02-23 NOTE — Progress Notes (Signed)
Christina Antigua, MD [address]  [place], [place] [address]  Main: [phone]  Fax: [phone]   Primary Care Physician: [name], MD   Chief Complaint  Patient presents with  . Rectal Bleeding    Patient states two weeks ago she was having the bleeding with every bowel movement. Has not had any bleeding in 2 weeks     HPI: Christina French is a 63 y.o. female here for follow-up of bright blood per rectum.  Reports has not had any further bright red blood in 3 weeks.  Reports her stools are now soft, type IV in consistency and she is taking MiraLAX every day.  The patient denies abdominal or flank pain, anorexia, nausea or vomiting, dysphagia, change in bowel habits or black or bloody stools or weight loss.  Has undergone EGD, colonoscopy and small bowel capsule study for hematochezia with the only finding attributable to her symptoms being internal hemorrhoids.  Follow-up with Dr. Marius Ditch for banding was discussed, but since symptoms have resolved patient does not want to consider banding at this time but will consider it later if symptoms return  Current Outpatient Medications  Medication Sig Dispense Refill  . acetaminophen (TYLENOL) 500 MG tablet Take 1,000 mg by mouth 2 (two) times daily as needed for mild pain or headache.    . ALPRAZolam (XANAX) 1 MG tablet Take 1 tablet (1 mg total) by mouth 3 (three) times daily as needed for anxiety. 90 tablet 3  . atenolol (TENORMIN) 50 MG tablet Take 1 tablet (50 mg total) by mouth 2 (two) times daily. 60 tablet 5  . carbamazepine (TEGRETOL) 200 MG tablet Take one and one-half tablets (300 mg total) by mouth 2 (two) times daily. (Patient taking differently: Take 300 mg by mouth 2 (two) times daily. ) 90 tablet 0  . cyclobenzaprine (FLEXERIL) 10 MG tablet Take 1 tablet (10 mg total) by mouth 3 (three) times daily as needed for muscle spasms. 90 tablet 3  . escitalopram (LEXAPRO) 20 MG tablet Take 20 mg by mouth  daily.     . ferrous sulfate 325 (65 FE) MG EC tablet Take 1 tablet (325 mg total) by mouth 2 (two) times daily. 60 tablet 0  . gabapentin (NEURONTIN) 100 MG capsule Take 1 capsule (100 mg total) by mouth 2 (two) times daily. 60 capsule 3  . Lacosamide (VIMPAT) 100 MG TABS Take by mouth. Week 1: 1/2 tab every 12 hours Week 2: 1/2 tab in am and 1 tab at night Week 3: (start on 02/05/2019) 1 tab every 12 hours Week 4:1 tab in am and 1.5 tab every 12 hours    . levETIRAcetam (KEPPRA) 500 MG tablet Take 500 mg by mouth 2 (two) times daily.    . mirtazapine (REMERON) 15 MG tablet Take 30 mg by mouth at bedtime.     Marland Kitchen omeprazole (PRILOSEC) 40 MG capsule Take 1 capsule (40 mg total) by mouth 2 (two) times daily. 180 capsule 1  . traZODone (DESYREL) 50 MG tablet Take 50 mg by mouth at bedtime.     No current facility-administered medications for this visit.     Allergies as of 02/23/2019 - Review Complete 02/23/2019  Allergen Reaction Noted  . No known allergies  11/17/2015    ROS:  General: Negative for anorexia, weight loss, fever, chills, fatigue, weakness. ENT: Negative for hoarseness, difficulty swallowing , nasal congestion. CV: Negative for chest pain, angina, palpitations, dyspnea on exertion,  peripheral edema.  Respiratory: Negative for dyspnea at rest, dyspnea on exertion, cough, sputum, wheezing.  GI: See history of present illness. GU:  Negative for dysuria, hematuria, urinary incontinence, urinary frequency, nocturnal urination.  Endo: Negative for unusual weight change.    Physical Examination:   BP 138/88 (BP Location: Left Arm, Patient Position: Sitting, Cuff Size: Normal)   Pulse (!) 107   Temp 98.1 F (36.7 C) (Oral)   Wt 204 lb 4 oz (92.6 kg)   BMI 38.59 kg/m   General: Well-nourished, well-developed in no acute distress.  Eyes: No icterus. Conjunctivae pink. Mouth: Oropharyngeal mucosa moist and pink , no lesions erythema or exudate. Neck: Supple, Trachea  midline Abdomen: Bowel sounds are normal, nontender, nondistended, no hepatosplenomegaly or masses, no abdominal bruits or hernia , no rebound or guarding.   Extremities: No lower extremity edema. No clubbing or deformities. Neuro: Alert and oriented x 3.  Grossly intact. Skin: Warm and dry, no jaundice.   Psych: Alert and cooperative, normal mood and affect.   Labs: CMP     Component Value Date/Time   NA 128 (L) 12/23/2018 0548   NA 133 (L) 05/27/2012 1035   K 3.4 (L) 12/23/2018 0548   K 4.6 05/27/2012 1035   CL 95 (L) 12/23/2018 0548   CL 98 05/27/2012 1035   CO2 22 12/23/2018 0548   CO2 26 05/27/2012 1035   GLUCOSE 125 (H) 12/23/2018 0548   GLUCOSE 100 (H) 05/27/2012 1035   BUN 9 12/23/2018 0548   BUN 15 05/27/2012 1035   CREATININE 0.82 12/23/2018 0548   CREATININE 0.77 02/18/2015 1121   CALCIUM 8.6 (L) 12/23/2018 0548   CALCIUM 9.0 05/27/2012 1035   PROT 7.6 12/20/2018 0240   PROT 8.3 (H) 05/27/2012 1035   ALBUMIN 4.8 12/20/2018 0240   ALBUMIN 4.1 05/27/2012 1035   AST 23 12/20/2018 0240   AST 32 05/27/2012 1035   ALT 17 12/20/2018 0240   ALT 29 05/27/2012 1035   ALKPHOS 94 12/20/2018 0240   ALKPHOS 110 05/27/2012 1035   BILITOT 0.6 12/20/2018 0240   BILITOT 0.4 05/27/2012 1035   GFRNONAA >60 12/23/2018 0548   GFRNONAA 55 (L) 08/24/2012 1157   GFRAA >60 12/23/2018 0548   GFRAA >60 08/24/2012 1157   Lab Results  Component Value Date   WBC 8.4 12/20/2018   HGB 10.3 (L) 12/21/2018   HCT 31.5 (L) 12/21/2018   MCV 74.1 (L) 12/20/2018   PLT 174 12/20/2018    Imaging Studies: No results found.  Assessment and Plan:   Christina French is a 63 y.o. y/o female history of internal hemorrhoids and bright red blood per rectum  Symptoms resolved with MiraLAX daily and maintain soft stool Continue the above  If symptoms return, I have asked the patient to call us and make an appointment with Dr. Marius Ditch for discussing banding procedure and she verbalized understanding     Dr Christina French

## 2019-02-27 ENCOUNTER — Encounter: Payer: Self-pay | Admitting: Nurse Practitioner

## 2019-02-27 ENCOUNTER — Ambulatory Visit (INDEPENDENT_AMBULATORY_CARE_PROVIDER_SITE_OTHER): Payer: Medicare Other | Admitting: Nurse Practitioner

## 2019-02-27 ENCOUNTER — Other Ambulatory Visit: Payer: Self-pay

## 2019-02-27 VITALS — BP 129/92 | HR 85 | Temp 96.9°F | Resp 16 | Ht 61.0 in | Wt 206.0 lb

## 2019-02-27 DIAGNOSIS — G4737 Central sleep apnea in conditions classified elsewhere: Secondary | ICD-10-CM | POA: Diagnosis not present

## 2019-02-27 DIAGNOSIS — I10 Essential (primary) hypertension: Secondary | ICD-10-CM | POA: Diagnosis not present

## 2019-02-27 DIAGNOSIS — G40909 Epilepsy, unspecified, not intractable, without status epilepticus: Secondary | ICD-10-CM | POA: Diagnosis not present

## 2019-02-27 NOTE — Progress Notes (Signed)
[facility] [place], [place] [address]  Internal MEDICINE  Office Visit Note  Patient Name: Christina French  [id number]  YD:[id number]  Date of Service: 02/27/2019  Chief Complaint  Patient presents with  . Referral    cardiology    The patient is here for follow up. Her neurologist is recommending she see cardiology for placement of neurostimulator, just under the surface of the skin. This is to help control seizures as well as central sleep apnea, which is related to seizure disorder. She is unable to tolerate traditional CPAP as she is extremely claustrophobic. Her neurologist feels as though she would be a good candidate for this. She did have echocardiogram done in 2016 which showed normal LVEF and normal wall motion of the ventricles. She did have some calcification present on aortic valve.       Current Medication: Outpatient Encounter Medications as of 02/27/2019  Medication Sig  . acetaminophen (TYLENOL) 500 MG tablet Take 1,000 mg by mouth 2 (two) times daily as needed for mild pain or headache.  . ALPRAZolam (XANAX) 1 MG tablet Take 1 tablet (1 mg total) by mouth 3 (three) times daily as needed for anxiety.  Marland Kitchen atenolol (TENORMIN) 50 MG tablet Take 1 tablet (50 mg total) by mouth 2 (two) times daily.  . carbamazepine (TEGRETOL) 200 MG tablet Take one and one-half tablets (300 mg total) by mouth 2 (two) times daily. (Patient taking differently: Take 300 mg by mouth 2 (two) times daily. )  . cyclobenzaprine (FLEXERIL) 10 MG tablet Take 1 tablet (10 mg total) by mouth 3 (three) times daily as needed for muscle spasms.  Marland Kitchen escitalopram (LEXAPRO) 20 MG tablet Take 20 mg by mouth daily.   Marland Kitchen gabapentin (NEURONTIN) 100 MG capsule Take 1 capsule (100 mg total) by mouth 2 (two) times daily.  . Lacosamide (VIMPAT) 100 MG TABS Take by mouth. Week 1: 1/2 tab every 12 hours Week 2: 1/2 tab in am and 1 tab at night Week 3: (start on 02/05/2019) 1 tab every 12 hours Week  4:1 tab in am and 1.5 tab every 12 hours  . levETIRAcetam (KEPPRA) 500 MG tablet Take 500 mg by mouth 2 (two) times daily.  . mirtazapine (REMERON) 15 MG tablet Take 30 mg by mouth at bedtime.   Marland Kitchen omeprazole (PRILOSEC) 40 MG capsule Take 1 capsule (40 mg total) by mouth 2 (two) times daily.  . traZODone (DESYREL) 50 MG tablet Take 50 mg by mouth at bedtime.  . ferrous sulfate 325 (65 FE) MG EC tablet Take 1 tablet (325 mg total) by mouth 2 (two) times daily.   No facility-administered encounter medications on file as of 02/27/2019.     Surgical History: Past Surgical History:  Procedure Laterality Date  . ABDOMINAL HYSTERECTOMY    . CESAREAN SECTION    . COLONOSCOPY N/A 08/31/2014   Procedure: COLONOSCOPY;  Surgeon: Lucilla Lame, MD;  Location: [place];  Service: Gastroenterology;  Laterality: N/A;  . COLONOSCOPY N/A 08/05/2016   Procedure: COLONOSCOPY;  Surgeon: [name], MD;  Location: [facility] ENDOSCOPY;  Service: Endoscopy;  Laterality: N/A;  . COLONOSCOPY WITH PROPOFOL N/A 12/22/2018   Procedure: COLONOSCOPY WITH PROPOFOL;  Surgeon: Virgel Manifold, MD;  Location: [facility] ENDOSCOPY;  Service: Endoscopy;  Laterality: N/A;  . CYST EXCISION  2011   from throat  . ESOPHAGOGASTRODUODENOSCOPY N/A 08/31/2014   Procedure: ESOPHAGOGASTRODUODENOSCOPY (EGD);  Surgeon: Lucilla Lame, MD;  Location: [place];  Service: Gastroenterology;  Laterality: N/A;  . ESOPHAGOGASTRODUODENOSCOPY N/A 12/21/2018   Procedure: ESOPHAGOGASTRODUODENOSCOPY (EGD);  Surgeon: Virgel Manifold, MD;  Location: [facility] ENDOSCOPY;  Service: Endoscopy;  Laterality: N/A;  . ESOPHAGOGASTRODUODENOSCOPY (EGD) WITH PROPOFOL N/A 07/01/2017   Procedure: ESOPHAGOGASTRODUODENOSCOPY (EGD) WITH PROPOFOL;  Surgeon: [name], MD;  Location: [facility] ENDOSCOPY;  Service: Gastroenterology;  Laterality: N/A;  . FLEXIBLE SIGMOIDOSCOPY N/A 07/01/2017   Procedure: FLEXIBLE SIGMOIDOSCOPY;  Surgeon: [name], MD;  Location: [facility]  ENDOSCOPY;  Service: Gastroenterology;  Laterality: N/A;  . GIVENS CAPSULE STUDY  12/22/2018   Procedure: GIVENS CAPSULE STUDY;  Surgeon: Virgel Manifold, MD;  Location: [facility] ENDOSCOPY;  Service: Endoscopy;;  . HEMORRHOID SURGERY    . INCISION AND DRAINAGE HIP Right 11/25/2014   Procedure: IRRIGATION  AND DRAINAGEOF RIGHT HIP WITH PLACEMENT OF ANTIBIOUTIC BEADS;  Surgeon: Mcarthur Rossetti, MD;  Location: [facility];  Service: Orthopedics;  Laterality: Right;  . INCISION AND DRAINAGE HIP Right 11/28/2014   Procedure: Repeat I&D Right Hip;  Surgeon: Mcarthur Rossetti, MD;  Location: [place];  Service: Orthopedics;  Laterality: Right;  . JOINT REPLACEMENT Right 2007   hip  . TOTAL HIP REVISION Right 03/19/2015   Procedure: RIGHT TOTAL HIP ARTHROPLASTY REVISION;  Surgeon: Meredith Pel, MD;  Location: [place];  Service: Orthopedics;  Laterality: Right;  . TOTAL HIP REVISION Right 11/18/2015   Procedure: TOTAL HIP REVISION;  Surgeon: Frederik Pear, MD;  Location: [place];  Service: Orthopedics;  Laterality: Right;    Medical History: Past Medical History:  Diagnosis Date  . Anemia   . Anxiety   . Arthritis   . Barrett esophagus   . Cancer ([place]) 2016    Skin cancer right leg mylenoma  . Depression   . Diabetes ([place]) 04/25/2017  . GERD (gastroesophageal reflux disease)   . History of blood transfusion    with a surgery  . History of bronchitis   . History of pneumonia   . Hypertension   . IBS (irritable bowel syndrome)   . Pneumonia 09/27/15  . PTSD (post-traumatic stress disorder)   . Renal insufficiency    reports acute kidney injury  . Seizures ([place])    rare - last one 07/30/14 - was very anemic.  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 Vertigo    thinks related to sinus issues    Family History: Family History  Problem Relation Age of Onset  . Alcoholism Father   . Throat cancer Mother     Social History   Socioeconomic History  . Marital status: Single    Spouse name: Not  on file  . Number of children: Not on file  . Years of education: Not on file  . Highest education level: Not on file  Occupational History  . Not on file  Social Needs  . Financial resource strain: Not on file  . Food insecurity    Worry: Not on file    Inability: Not on file  . Transportation needs    Medical: Not on file    Non-medical: Not on file  Tobacco Use  . Smoking status: Never Smoker  . Smokeless tobacco: Never Used  Substance and Sexual Activity  . Alcohol use: No    Alcohol/week: 0.0 standard drinks  . Drug use: No  . Sexual activity: Not Currently  Lifestyle  . Physical activity    Days per week: Not on file    Minutes per session: Not on file  . Stress: Not on file  Relationships  . Social Herbalist on phone: Not on file    Gets together: Not on file    Attends religious service: Not on file    Active member of club or organization: Not on file    Attends meetings of clubs or organizations: Not on file    Relationship status: Not on file  . Intimate partner violence    Fear of current or ex partner: Not on file    Emotionally abused: Not on file    Physically abused: Not on file    Forced sexual activity: Not on file  Other Topics Concern  . Not on file  Social History Narrative  . Not on file      Review of Systems  Constitutional: Negative for activity change, chills, fatigue and unexpected weight change.  HENT: Negative for congestion, postnasal drip, rhinorrhea, sneezing and sore throat.   Respiratory: Negative for cough, chest tightness and shortness of breath.   Cardiovascular: Negative for chest pain and palpitations.       Blood pressure well controlled with current medication.   Gastrointestinal: Negative for abdominal pain, constipation, diarrhea, nausea and vomiting.  Endocrine: Negative for cold intolerance, heat intolerance, polydipsia and polyuria.       Thyroid panel abnormal when labs were checked in 03/2018.    Musculoskeletal: Positive for back pain and myalgias. Negative for arthralgias, joint swelling and neck pain.       Chronic back pain, radiates to both hips and down the upper legs .  Skin: Negative for rash.  Allergic/Immunologic: Negative for environmental allergies.  Neurological: Positive for seizures, weakness and numbness. Negative for tremors.       Well managed on current medications.   Hematological: Negative for adenopathy. Does not bruise/bleed easily.  Psychiatric/Behavioral: Negative for behavioral problems (Depression), sleep disturbance and suicidal ideas. The patient is not nervous/anxious.     Today's Vitals   02/27/19 0911  BP: (!) 129/92  Pulse: 85  Resp: 16  Temp: (!) 96.9 F (36.1 C)  SpO2: 94%  Weight: 206 lb (93.4 kg)  Height: 5\' 1"  (1.549 m)   Body mass index is 38.92 kg/m.  Physical Exam Vitals signs and nursing note reviewed.  Constitutional:      General: She is not in acute distress.    Appearance: Normal appearance. She is well-developed. She is not diaphoretic.  HENT:     Head: Normocephalic and atraumatic.     Mouth/Throat:     Pharynx: No oropharyngeal exudate.  Eyes:     Pupils: Pupils are equal, round, and reactive to light.  Neck:     Musculoskeletal: Normal range of motion and neck supple.     Thyroid: No thyromegaly.     Vascular: No carotid bruit or JVD.     Trachea: No tracheal deviation.  Cardiovascular:     Rate and Rhythm: Normal rate and regular rhythm.     Heart sounds: Normal heart sounds. No murmur. No friction rub. No gallop.   Pulmonary:     Effort: Pulmonary effort is normal. No respiratory distress.     Breath sounds: Normal breath sounds. No wheezing or rales.  Chest:     Chest wall: No tenderness.  Abdominal:     Palpations: Abdomen is soft.  Musculoskeletal:     Comments: Moderate and intermittent low back pain. Worse with bending and twisting at the waist. Radiates into both hips and thighs. Currently using a  walker to help with  ambulation.   Lymphadenopathy:     Cervical: No cervical adenopathy.  Skin:    General: Skin is warm and dry.  Neurological:     Mental Status: She is alert and oriented to person, place, and time. Mental status is at baseline.     Cranial Nerves: No cranial nerve deficit.  Psychiatric:        Behavior: Behavior normal.        Thought Content: Thought content normal.        Judgment: Judgment normal.    Assessment/Plan: 1. Seizure disorder [facility]) Patient's neurologist has recommended she see cardiologist for placement of neurostimulator to help control seizures and central sleep apnea. A referral has been made today.   2. Central sleep apnea due to medical condition Patient's neurologist has recommended she see cardiologist for placement of neurostimulator to help control seizures and central sleep apnea. A referral has been made today.  - Ambulatory referral to Cardiology  3. Essential hypertension Stable. Continue medication as prescribed   General Counseling: thalia gooley understanding of the findings of todays visit and agrees with plan of treatment. I have discussed any further diagnostic evaluation that may be needed or ordered today. We also reviewed her medications today. she has been encouraged to call the office with any questions or concerns that should arise related to todays visit.   This patient was seen by Leretha Pol FNP Collaboration with Dr [name] as a part of collaborative care agreement  Orders Placed This Encounter  Procedures  . Ambulatory referral to Cardiology     Time spent: 25 Minutes      Dr [name] Internal medicine

## 2019-03-09 ENCOUNTER — Ambulatory Visit: Payer: Medicare Other | Admitting: Gastroenterology

## 2019-03-17 DIAGNOSIS — G459 Transient cerebral ischemic attack, unspecified: Secondary | ICD-10-CM | POA: Diagnosis not present

## 2019-03-17 DIAGNOSIS — R739 Hyperglycemia, unspecified: Secondary | ICD-10-CM | POA: Diagnosis not present

## 2019-03-17 DIAGNOSIS — Z79899 Other long term (current) drug therapy: Secondary | ICD-10-CM | POA: Diagnosis not present

## 2019-03-24 ENCOUNTER — Other Ambulatory Visit: Admission: RE | Admit: 2019-03-24 | Payer: Medicare Other | Source: Ambulatory Visit

## 2019-03-28 ENCOUNTER — Ambulatory Visit: Admission: RE | Admit: 2019-03-28 | Payer: Medicare Other | Source: Home / Self Care | Admitting: Cardiology

## 2019-03-28 ENCOUNTER — Encounter: Admission: RE | Payer: Self-pay | Source: Home / Self Care

## 2019-03-28 SURGERY — LOOP RECORDER INSERTION
Anesthesia: LOCAL

## 2019-03-30 ENCOUNTER — Other Ambulatory Visit: Payer: Self-pay

## 2019-03-30 ENCOUNTER — Other Ambulatory Visit
Admission: RE | Admit: 2019-03-30 | Discharge: 2019-03-30 | Disposition: A | Discharge status: Home / Self Care | Payer: Medicare Other | Source: Ambulatory Visit | Attending: Cardiology | Admitting: Cardiology

## 2019-03-30 DIAGNOSIS — Z20828 Contact with and (suspected) exposure to other viral communicable diseases: Secondary | ICD-10-CM | POA: Insufficient documentation

## 2019-03-30 DIAGNOSIS — Z01812 Encounter for preprocedural laboratory examination: Secondary | ICD-10-CM | POA: Insufficient documentation

## 2019-03-31 ENCOUNTER — Other Ambulatory Visit: Payer: Medicare Other

## 2019-03-31 LAB — SARS CORONAVIRUS 2 (TAT 6-24 HRS): SARS Coronavirus 2: NEGATIVE

## 2019-04-04 ENCOUNTER — Encounter
Admission: RE | Disposition: A | Discharge status: Home / Self Care | Payer: Self-pay | Source: Home / Self Care | Attending: Cardiology

## 2019-04-04 ENCOUNTER — Ambulatory Visit
Admission: RE | Admit: 2019-04-04 | Discharge: 2019-04-04 | Disposition: A | Discharge status: Home / Self Care | Payer: Medicare Other | Attending: Cardiology | Admitting: Cardiology

## 2019-04-04 ENCOUNTER — Other Ambulatory Visit: Payer: Self-pay

## 2019-04-04 ENCOUNTER — Ambulatory Visit: Payer: Medicare Other | Admitting: Gastroenterology

## 2019-04-04 DIAGNOSIS — R569 Unspecified convulsions: Secondary | ICD-10-CM | POA: Diagnosis not present

## 2019-04-04 DIAGNOSIS — I10 Essential (primary) hypertension: Secondary | ICD-10-CM | POA: Diagnosis not present

## 2019-04-04 DIAGNOSIS — Z79899 Other long term (current) drug therapy: Secondary | ICD-10-CM | POA: Insufficient documentation

## 2019-04-04 DIAGNOSIS — K589 Irritable bowel syndrome without diarrhea: Secondary | ICD-10-CM | POA: Diagnosis not present

## 2019-04-04 DIAGNOSIS — I4891 Unspecified atrial fibrillation: Secondary | ICD-10-CM

## 2019-04-04 DIAGNOSIS — G459 Transient cerebral ischemic attack, unspecified: Secondary | ICD-10-CM | POA: Diagnosis not present

## 2019-04-04 DIAGNOSIS — E7849 Other hyperlipidemia: Secondary | ICD-10-CM | POA: Insufficient documentation

## 2019-04-04 HISTORY — PX: LOOP RECORDER INSERTION: EP1214

## 2019-04-04 SURGERY — LOOP RECORDER INSERTION
Anesthesia: LOCAL

## 2019-04-04 MED ORDER — LIDOCAINE HCL (PF) 1 % IJ SOLN
INTRAMUSCULAR | Status: DC | PRN
  Administered 2019-04-04: 20 mg via SUBCUTANEOUS

## 2019-04-04 MED ORDER — LIDOCAINE HCL (PF) 1 % IJ SOLN
INTRAMUSCULAR | Status: AC
  Filled 2019-04-04: qty 30

## 2019-04-04 SURGICAL SUPPLY — 2 items
LOOP REVEAL LINQSYS (Prosthesis & Implant Heart) ×1 IMPLANT
PACK LOOP INSERTION (CUSTOM PROCEDURE TRAY) ×1 IMPLANT

## 2019-04-10 ENCOUNTER — Ambulatory Visit: Payer: Self-pay | Admitting: Nurse Practitioner

## 2019-04-10 ENCOUNTER — Telehealth: Payer: Self-pay

## 2019-04-10 NOTE — Telephone Encounter (Signed)
QUALITY OF CARE OUTREACH LETTER RECEIVED FROM [facility]. CHECK HBA1C AND MICROALBUMIN OR UA 03-30-19.

## 2019-05-01 ENCOUNTER — Other Ambulatory Visit: Payer: Self-pay

## 2019-05-01 MED ORDER — ATENOLOL 50 MG PO TABS: 50.0000 mg | ORAL_TABLET | Freq: Two times a day (BID) | ORAL | 5 refills | Status: DC

## 2019-05-01 MED ORDER — OMEPRAZOLE 40 MG PO CPDR: 40.0000 mg | DELAYED_RELEASE_CAPSULE | Freq: Two times a day (BID) | ORAL | 1 refills | Status: DC

## 2019-05-16 DIAGNOSIS — R569 Unspecified convulsions: Secondary | ICD-10-CM | POA: Diagnosis not present

## 2019-05-25 ENCOUNTER — Other Ambulatory Visit: Payer: Self-pay

## 2019-05-25 DIAGNOSIS — M792 Neuralgia and neuritis, unspecified: Secondary | ICD-10-CM

## 2019-05-25 MED ORDER — GABAPENTIN 100 MG PO CAPS: 100.0000 mg | ORAL_CAPSULE | Freq: Two times a day (BID) | ORAL | 1 refills | Status: DC

## 2019-06-08 ENCOUNTER — Other Ambulatory Visit: Payer: Self-pay

## 2019-06-08 DIAGNOSIS — M5440 Lumbago with sciatica, unspecified side: Secondary | ICD-10-CM

## 2019-06-08 MED ORDER — CYCLOBENZAPRINE HCL 10 MG PO TABS: 10.0000 mg | ORAL_TABLET | Freq: Three times a day (TID) | ORAL | 1 refills | Status: DC | PRN

## 2019-06-14 ENCOUNTER — Telehealth: Payer: Self-pay

## 2019-06-14 NOTE — Telephone Encounter (Signed)
Confirmed 06-16-19 ov as virtual.

## 2019-06-16 ENCOUNTER — Encounter: Payer: Self-pay | Admitting: Nurse Practitioner

## 2019-06-16 ENCOUNTER — Ambulatory Visit (INDEPENDENT_AMBULATORY_CARE_PROVIDER_SITE_OTHER): Payer: Medicare Other | Admitting: Nurse Practitioner

## 2019-06-16 VITALS — Ht 61.0 in | Wt 201.0 lb

## 2019-06-16 DIAGNOSIS — M792 Neuralgia and neuritis, unspecified: Secondary | ICD-10-CM | POA: Diagnosis not present

## 2019-06-16 DIAGNOSIS — Z0001 Encounter for general adult medical examination with abnormal findings: Secondary | ICD-10-CM

## 2019-06-16 DIAGNOSIS — M5440 Lumbago with sciatica, unspecified side: Secondary | ICD-10-CM

## 2019-06-16 DIAGNOSIS — Z1231 Encounter for screening mammogram for malignant neoplasm of breast: Secondary | ICD-10-CM | POA: Diagnosis not present

## 2019-06-16 DIAGNOSIS — I10 Essential (primary) hypertension: Secondary | ICD-10-CM | POA: Diagnosis not present

## 2019-06-16 MED ORDER — CYCLOBENZAPRINE HCL 10 MG PO TABS: 10.0000 mg | ORAL_TABLET | Freq: Three times a day (TID) | ORAL | 3 refills | Status: DC | PRN

## 2019-06-16 MED ORDER — GABAPENTIN 100 MG PO CAPS: 100.0000 mg | ORAL_CAPSULE | Freq: Two times a day (BID) | ORAL | 3 refills | Status: DC

## 2019-06-16 NOTE — Progress Notes (Signed)
[facility] [place], [place] [address]  Internal MEDICINE  Telephone Visit  Patient Name: Christina French  [id number]  [id number]  Date of Service: 06/16/2019  I connected with the patient at 9:25am by telephone and verified the patients identity using two identifiers.   I discussed the limitations, risks, security and privacy concerns of performing an evaluation and management service by telephone and the availability of in person appointments. I also discussed with the patient that there may be a patient responsible charge related to the service.  The patient expressed understanding and agrees to proceed.    Chief Complaint  Patient presents with  . Telephone Assessment  . Telephone Screen  . Medicare Wellness  . Quality Metric Gaps    mammogram and pneumonia vacc    The patient has been contacted via telephone for health maintenance visit due to concerns for spread of novel coronavirus. She states that she is new doing very well. Was put on new medication per neurologist. She had been having breakthrough seizures on tegretol and keppra. She is now taking vimpat 150mg  twice daily. She states that "she has never felt better." her neurologist referred her to cardiology to make sure there were no cardiac issues leading to breakthrough seizures. Patient is wearing a cardiac monitor. States that she has only had to press the button, indicating palpitations twice. She is due to have screening mammogram. States that she will call to make appointment, as she relies on her daughter in law to take her to appointments, and the work schedule varies.  The patient does not wish to have a flu or pneumonia vaccine .      Current Medication: Outpatient Encounter Medications as of 06/16/2019  Medication Sig  . acetaminophen (TYLENOL) 500 MG tablet Take 1,000 mg by mouth 2 (two) times daily as needed for mild pain or headache.  . ALPRAZolam (XANAX) 1 MG tablet Take 1 tablet (1 mg  total) by mouth 3 (three) times daily as needed for anxiety.  Marland Kitchen aspirin EC 81 MG tablet Take 81 mg by mouth daily.  Marland Kitchen atenolol (TENORMIN) 50 MG tablet Take 1 tablet (50 mg total) by mouth 2 (two) times daily.  Marland Kitchen escitalopram (LEXAPRO) 20 MG tablet Take 20 mg by mouth daily.   . Lacosamide (VIMPAT) 150 MG TABS Take 150 mg by mouth 2 (two) times daily.   Marland Kitchen levETIRAcetam (KEPPRA) 500 MG tablet Take 500 mg by mouth 2 (two) times daily.  . mirtazapine (REMERON) 15 MG tablet Take 30 mg by mouth at bedtime.   Marland Kitchen omeprazole (PRILOSEC) 40 MG capsule Take 1 capsule (40 mg total) by mouth 2 (two) times daily.  . traZODone (DESYREL) 50 MG tablet Take 50 mg by mouth at bedtime.  . [DISCONTINUED] cyclobenzaprine (FLEXERIL) 10 MG tablet Take 1 tablet (10 mg total) by mouth 3 (three) times daily as needed for muscle spasms.  . [DISCONTINUED] gabapentin (NEURONTIN) 100 MG capsule Take 1 capsule (100 mg total) by mouth 2 (two) times daily.  . cyclobenzaprine (FLEXERIL) 10 MG tablet Take 1 tablet (10 mg total) by mouth 3 (three) times daily as needed for muscle spasms.  . ferrous sulfate 325 (65 FE) MG EC tablet Take 1 tablet (325 mg total) by mouth 2 (two) times daily.  Marland Kitchen gabapentin (NEURONTIN) 100 MG capsule Take 1 capsule (100 mg total) by mouth 2 (two) times daily.  . [DISCONTINUED] carbamazepine (TEGRETOL) 200 MG tablet Take one and one-half tablets (300 mg total)  by mouth 2 (two) times daily. (Patient not taking: Reported on 06/16/2019)   No facility-administered encounter medications on file as of 06/16/2019.    Surgical History: Past Surgical History:  Procedure Laterality Date  . ABDOMINAL HYSTERECTOMY    . CESAREAN SECTION    . COLONOSCOPY N/A 08/31/2014   Procedure: COLONOSCOPY;  Surgeon: Lucilla Lame, MD;  Location: [place];  Service: Gastroenterology;  Laterality: N/A;  . COLONOSCOPY N/A 08/05/2016   Procedure: COLONOSCOPY;  Surgeon: [name], MD;  Location: [facility] ENDOSCOPY;  Service:  Endoscopy;  Laterality: N/A;  . COLONOSCOPY WITH PROPOFOL N/A 12/22/2018   Procedure: COLONOSCOPY WITH PROPOFOL;  Surgeon: Virgel Manifold, MD;  Location: [facility] ENDOSCOPY;  Service: Endoscopy;  Laterality: N/A;  . CYST EXCISION  2011   from throat  . ESOPHAGOGASTRODUODENOSCOPY N/A 08/31/2014   Procedure: ESOPHAGOGASTRODUODENOSCOPY (EGD);  Surgeon: Lucilla Lame, MD;  Location: [place];  Service: Gastroenterology;  Laterality: N/A;  . ESOPHAGOGASTRODUODENOSCOPY N/A 12/21/2018   Procedure: ESOPHAGOGASTRODUODENOSCOPY (EGD);  Surgeon: Virgel Manifold, MD;  Location[facility] ENDOSCOPY;  Service: Endoscopy;  Laterality: N/A;  . ESOPHAGOGASTRODUODENOSCOPY (EGD) WITH PROPOFOL N/A 07/01/2017   Procedure: ESOPHAGOGASTRODUODENOSCOPY (EGD) WITH PROPOFOL;  Surgeon: [name], MD;  Location: [facility] ENDOSCOPY;  Service: Gastroenterology;  Laterality: N/A;  . FLEXIBLE SIGMOIDOSCOPY N/A 07/01/2017   Procedure: FLEXIBLE SIGMOIDOSCOPY;  Surgeon: [name], MD;  Location: [facility] ENDOSCOPY;  Service: Gastroenterology;  Laterality: N/A;  . GIVENS CAPSULE STUDY  12/22/2018   Procedure: GIVENS CAPSULE STUDY;  Surgeon: Virgel Manifold, MD;  Location: [facility] ENDOSCOPY;  Service: Endoscopy;;  . HEMORRHOID SURGERY    . INCISION AND DRAINAGE HIP Right 11/25/2014   Procedure: IRRIGATION  AND DRAINAGEOF RIGHT HIP WITH PLACEMENT OF ANTIBIOUTIC BEADS;  Surgeon: Mcarthur Rossetti, MD;  Location: [place];  Service: Orthopedics;  Laterality: Right;  . INCISION AND DRAINAGE HIP Right 11/28/2014   Procedure: Repeat I&D Right Hip;  Surgeon: Mcarthur Rossetti, MD;  Location: [place];  Service: Orthopedics;  Laterality: Right;  . JOINT REPLACEMENT Right 2007   hip  . LOOP RECORDER INSERTION N/A 04/04/2019   Procedure: LOOP RECORDER INSERTION;  Surgeon: Isaias Cowman, MD;  Location: [facility];  Service: Cardiovascular;  Laterality: N/A;  . TOTAL HIP REVISION Right 03/19/2015   Procedure: RIGHT TOTAL HIP  ARTHROPLASTY REVISION;  Surgeon: Meredith Pel, MD;  Location: [place];  Service: Orthopedics;  Laterality: Right;  . TOTAL HIP REVISION Right 11/18/2015   Procedure: TOTAL HIP REVISION;  Surgeon: Frederik Pear, MD;  Location: [place];  Service: Orthopedics;  Laterality: Right;    Medical History: Past Medical History:  Diagnosis Date  . Anemia   . Anxiety   . Arthritis   . Barrett esophagus   . Cancer ([place]) 2016    Skin cancer right leg mylenoma  . Depression   . Diabetes ([place]) 04/25/2017  . GERD (gastroesophageal reflux disease)   . History of blood transfusion    with a surgery  . History of bronchitis   . History of pneumonia   . Hypertension   . IBS (irritable bowel syndrome)   . Pneumonia 09/27/15  . PTSD (post-traumatic stress disorder)   . Renal insufficiency    reports acute kidney injury  . Seizures ([place])    rare - last one 07/30/14 - was very anemic.  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 Vertigo    thinks related to sinus issues    Family History: Family History  Problem  Relation Age of Onset  . Alcoholism Father   . Throat cancer Mother     Social History   Socioeconomic History  . Marital status: Single    Spouse name: Not on file  . Number of children: Not on file  . Years of education: Not on file  . Highest education level: Not on file  Occupational History  . Not on file  Tobacco Use  . Smoking status: Never Smoker  . Smokeless tobacco: Never Used  Substance and Sexual Activity  . Alcohol use: No    Alcohol/week: 0.0 standard drinks  . Drug use: No  . Sexual activity: Not Currently  Other Topics Concern  . Not on file  Social History Narrative  . Not on file   Social Determinants of Health   Financial Resource Strain:   . Difficulty of Paying Living Expenses: Not on file  Food Insecurity:   . Worried About Charity fundraiser in the Last Year: Not on file  . Ran Out of Food in the Last Year: Not on file  Transportation Needs:   . Lack of  Transportation (Medical): Not on file  . Lack of Transportation (Non-Medical): Not on file  Physical Activity:   . Days of Exercise per Week: Not on file  . Minutes of Exercise per Session: Not on file  Stress:   . Feeling of Stress : Not on file  Social Connections:   . Frequency of Communication with Friends and Family: Not on file  . Frequency of Social Gatherings with Friends and Family: Not on file  . Attends Religious Services: Not on file  . Active Member of Clubs or Organizations: Not on file  . Attends Archivist Meetings: Not on file  . Marital Status: Not on file  Intimate Partner Violence:   . Fear of Current or Ex-Partner: Not on file  . Emotionally Abused: Not on file  . Physically Abused: Not on file  . Sexually Abused: Not on file      Review of Systems  Constitutional: Negative for activity change, chills, fatigue and unexpected weight change.  HENT: Negative for congestion, postnasal drip, rhinorrhea, sneezing and sore throat.   Respiratory: Negative for cough, chest tightness and shortness of breath.   Cardiovascular: Negative for chest pain and palpitations.          Gastrointestinal: Negative for abdominal pain, constipation, diarrhea, nausea and vomiting.  Endocrine: Negative for cold intolerance, heat intolerance, polydipsia and polyuria.  Musculoskeletal: Positive for back pain and myalgias. Negative for arthralgias, joint swelling and neck pain.       Chronic back pain, radiates to both hips and down the upper legs .  Skin: Negative for rash.  Allergic/Immunologic: Negative for environmental allergies.  Neurological: Positive for seizures, weakness and numbness. Negative for tremors.       Recent medication changes per neurology. meds updated in med list.   Hematological: Negative for adenopathy. Does not bruise/bleed easily.  Psychiatric/Behavioral: Positive for dysphoric mood. Negative for behavioral problems (Depression), sleep disturbance  and suicidal ideas. The patient is nervous/anxious.        Patient seeing psychiatry and doing well with current medication.    Today's Vitals   06/16/19 0904  Weight: 201 lb (91.2 kg)  Height: 5\' 1"  (1.549 m)   Body mass index is 37.98 kg/m.  Observation/Objective:   The patient is alert and oriented. She is pleasant and answers all questions appropriately. Breathing is non-labored. She is in  no acute distress at this time.   Depression screen [facility] 2/9 06/16/2019 02/02/2019 10/07/2018 06/16/2018 02/15/2018  Decreased Interest 0 0 1 0 0  Down, Depressed, Hopeless 0 0 0 0 0  PHQ - 2 Score 0 0 1 0 0    Functional Status Survey: Is the patient deaf or have difficulty hearing?: No Does the patient have difficulty seeing, even when wearing glasses/contacts?: No Does the patient have difficulty concentrating, remembering, or making decisions?: No Does the patient have difficulty walking or climbing stairs?: No Does the patient have difficulty dressing or bathing?: No Does the patient have difficulty doing errands alone such as visiting a doctor's office or shopping?: Yes  MMSE - [place] Exam 06/16/2019 02/15/2018  Orientation to time 5 5  Orientation to Place 5 5  Registration 3 3  Attention/ Calculation 5 5  Recall 3 3  Language- name 2 objects 2 2  Language- repeat 1 1  Language- follow 3 step command 3 3  Language- read & follow direction 1 1  Write a sentence 1 1  Copy design 1 1  Total score 30 30    Fall Risk  06/16/2019 02/02/2019 10/07/2018 06/16/2018 02/15/2018  Falls in the past year? 1 1 0 0 Yes  Number falls in past yr: 1 0 - - 2 or more  Injury with Fall? 0 0 - - No  Risk for fall due to : - - - - -      Assessment/Plan:  1. Encounter for general adult medical examination with abnormal findings Annual health maintenance exam today.   2. Essential hypertension Stable. Continue BP medications as prescribed. Regular visits with cardiology as scheduled.    3. Low back pain of thoracolumbar region with sciatica May continue to take flexeril 10mg  up to three times daily if needed for muscle pain. Refills provided today.  - cyclobenzaprine (FLEXERIL) 10 MG tablet; Take 1 tablet (10 mg total) by mouth 3 (three) times daily as needed for muscle spasms.  Dispense: 90 tablet; Refill: 3  4. Neuralgia and neuritis, unspecified Gabapentin 100mg  may be taken twice daily. Refills provided today.  - gabapentin (NEURONTIN) 100 MG capsule; Take 1 capsule (100 mg total) by mouth 2 (two) times daily.  Dispense: 60 capsule; Refill: 3  5. Encounter for screening mammogram for malignant neoplasm of breast Patient will call to make appointment for screening mammogram.  - MM DIGITAL SCREENING BILATERAL; Future  General Counseling: bethan lucke understanding of the findings of today's phone visit and agrees with plan of treatment. I have discussed any further diagnostic evaluation that may be needed or ordered today. We also reviewed her medications today. she has been encouraged to call the office with any questions or concerns that should arise related to todays visit.  This patient was seen by Leretha Pol FNP Collaboration with Dr [name] as a part of collaborative care agreement  Orders Placed This Encounter  Procedures  . MM DIGITAL SCREENING BILATERAL    Meds ordered this encounter  Medications  . cyclobenzaprine (FLEXERIL) 10 MG tablet    Sig: Take 1 tablet (10 mg total) by mouth 3 (three) times daily as needed for muscle spasms.    Dispense:  90 tablet    Refill:  3    Order Specific Question:   Supervising Provider    Answer:   [name] [id number]  . gabapentin (NEURONTIN) 100 MG capsule    Sig: Take 1 capsule (100 mg total)  by mouth 2 (two) times daily.    Dispense:  60 capsule    Refill:  3    Order Specific Question:   Supervising Provider    Answer:   [name] [[id number]    Time spent: 30 Minutes  Time spent with patient  included reviewing progress notes, labs, imaging studies, and discussing plan for follow up.   Dr [name] Internal medicine

## 2019-07-21 DIAGNOSIS — G459 Transient cerebral ischemic attack, unspecified: Secondary | ICD-10-CM | POA: Diagnosis not present

## 2019-10-12 ENCOUNTER — Telehealth: Payer: Self-pay

## 2019-10-12 NOTE — Telephone Encounter (Signed)
Confirmed and screened for 10-16-19. Not yet vaccinated for Covid.

## 2019-10-12 NOTE — Telephone Encounter (Signed)
Patient cancelled appointment on 10/16/2019 will call back to reschedule. klh

## 2019-10-16 ENCOUNTER — Ambulatory Visit: Payer: Medicare Other | Admitting: Nurse Practitioner

## 2019-10-24 ENCOUNTER — Other Ambulatory Visit: Payer: Self-pay | Admitting: Internal Medicine

## 2019-10-24 NOTE — Telephone Encounter (Signed)
Pt need appt for refills  ?

## 2019-11-02 DIAGNOSIS — G459 Transient cerebral ischemic attack, unspecified: Secondary | ICD-10-CM | POA: Diagnosis not present

## 2019-11-14 ENCOUNTER — Other Ambulatory Visit: Payer: Self-pay | Admitting: Internal Medicine

## 2019-11-14 NOTE — Telephone Encounter (Signed)
Pt need appt for refills  ?

## 2019-11-24 ENCOUNTER — Other Ambulatory Visit: Payer: Self-pay

## 2019-11-24 DIAGNOSIS — M5440 Lumbago with sciatica, unspecified side: Secondary | ICD-10-CM

## 2019-11-24 MED ORDER — CYCLOBENZAPRINE HCL 10 MG PO TABS: 10.0000 mg | ORAL_TABLET | Freq: Three times a day (TID) | ORAL | 3 refills | Status: DC | PRN

## 2019-12-27 ENCOUNTER — Other Ambulatory Visit: Payer: Self-pay

## 2019-12-27 DIAGNOSIS — M792 Neuralgia and neuritis, unspecified: Secondary | ICD-10-CM

## 2019-12-27 MED ORDER — ATENOLOL 50 MG PO TABS: 50.0000 mg | ORAL_TABLET | Freq: Two times a day (BID) | ORAL | 0 refills | Status: DC

## 2019-12-27 MED ORDER — GABAPENTIN 100 MG PO CAPS: 100.0000 mg | ORAL_CAPSULE | Freq: Two times a day (BID) | ORAL | 0 refills | Status: DC

## 2020-01-02 ENCOUNTER — Telehealth: Payer: Self-pay

## 2020-01-09 ENCOUNTER — Ambulatory Visit (INDEPENDENT_AMBULATORY_CARE_PROVIDER_SITE_OTHER): Payer: Medicare Other | Admitting: Nurse Practitioner

## 2020-01-09 ENCOUNTER — Encounter: Payer: Self-pay | Admitting: Nurse Practitioner

## 2020-01-09 DIAGNOSIS — M5440 Lumbago with sciatica, unspecified side: Secondary | ICD-10-CM | POA: Diagnosis not present

## 2020-01-09 DIAGNOSIS — E1165 Type 2 diabetes mellitus with hyperglycemia: Secondary | ICD-10-CM

## 2020-01-09 DIAGNOSIS — G40909 Epilepsy, unspecified, not intractable, without status epilepticus: Secondary | ICD-10-CM | POA: Diagnosis not present

## 2020-01-09 MED ORDER — CYCLOBENZAPRINE HCL 10 MG PO TABS: 10.0000 mg | ORAL_TABLET | Freq: Three times a day (TID) | ORAL | 3 refills | Status: DC | PRN

## 2020-01-09 MED ORDER — DAPAGLIFLOZIN PROPANEDIOL 5 MG PO TABS: 5.0000 mg | ORAL_TABLET | Freq: Every day | ORAL | 2 refills | Status: DC

## 2020-01-09 NOTE — Progress Notes (Signed)
[facility] [place],  [address]  Internal MEDICINE  Telephone Visit  Patient Name: Christina French  [id number]  [id number]  Date of Service: 01/31/2020  I connected with the patient at 4:51pm by telephone and verified the patients identity using two identifiers.   I discussed the limitations, risks, security and privacy concerns of performing an evaluation and management service by telephone and the availability of in person appointments. I also discussed with the patient that there may be a patient responsible charge related to the service.  The patient expressed understanding and agrees to proceed.    Chief Complaint  Patient presents with  . Telephone Assessment    telephone  [phone]  . Telephone Screen  . Follow-up    pt says that her insurance check her hBa1c 13.0   . Diabetes  . Hypertension  . Anxiety  . Quality Metric Gaps    flu shots and urine microalbumin    The patient has been contacted via telephone for follow up visit due to concerns for spread of novel coronavirus. She was recently seen at home by nurse practitioner who works for her [employer]. She states that her blood sugar was very high and her HgbA1c was over 14.0. she has not been diabetic in the past. She states that she is feeling well. She denies any increase in thirst or need to urinate. She has not lost any weight unintentionally. She is overdue to have check of routine, fasting labs.       Current Medication: Outpatient Encounter Medications as of 01/09/2020  Medication Sig  . acetaminophen (TYLENOL) 500 MG tablet Take 1,000 mg by mouth 2 (two) times daily as needed for mild pain or headache.  . ALPRAZolam (XANAX) 1 MG tablet Take 1 tablet (1 mg total) by mouth 3 (three) times daily as needed for anxiety.  Marland Kitchen aspirin EC 81 MG tablet Take 81 mg by mouth daily.  . cyclobenzaprine (FLEXERIL) 10 MG tablet Take 1 tablet (10 mg total) by mouth 3 (three) times daily  as needed for muscle spasms.  Marland Kitchen escitalopram (LEXAPRO) 20 MG tablet Take 20 mg by mouth daily.   . Lacosamide (VIMPAT) 150 MG TABS Take 150 mg by mouth 2 (two) times daily.   Marland Kitchen levETIRAcetam (KEPPRA) 500 MG tablet Take 500 mg by mouth 2 (two) times daily.  . mirtazapine (REMERON) 15 MG tablet Take 30 mg by mouth at bedtime.   Marland Kitchen omeprazole (PRILOSEC) 40 MG capsule TAKE ONE CAPSULE BY MOUTH TWICE A DAY  . traZODone (DESYREL) 50 MG tablet Take 50 mg by mouth at bedtime.  . [DISCONTINUED] atenolol (TENORMIN) 50 MG tablet Take 1 tablet (50 mg total) by mouth 2 (two) times daily.  . [DISCONTINUED] cyclobenzaprine (FLEXERIL) 10 MG tablet Take 1 tablet (10 mg total) by mouth 3 (three) times daily as needed for muscle spasms.  . [DISCONTINUED] gabapentin (NEURONTIN) 100 MG capsule Take 1 capsule (100 mg total) by mouth 2 (two) times daily.  . ferrous sulfate 325 (65 FE) MG EC tablet Take 1 tablet (325 mg total) by mouth 2 (two) times daily.  . [DISCONTINUED] dapagliflozin propanediol (FARXIGA) 5 MG TABS tablet Take 1 tablet (5 mg total) by mouth daily before breakfast.   No facility-administered encounter medications on file as of 01/09/2020.    Surgical History: Past Surgical History:  Procedure Laterality Date  . ABDOMINAL HYSTERECTOMY    . CESAREAN SECTION    . COLONOSCOPY N/A 08/31/2014  Procedure: COLONOSCOPY;  Surgeon: Lucilla Lame, MD;  Location: [place];  Service: Gastroenterology;  Laterality: N/A;  . COLONOSCOPY N/A 08/05/2016   Procedure: COLONOSCOPY;  Surgeon: [name], MD;  Location: [facility] ENDOSCOPY;  Service: Endoscopy;  Laterality: N/A;  . COLONOSCOPY WITH PROPOFOL N/A 12/22/2018   Procedure: COLONOSCOPY WITH PROPOFOL;  Surgeon: Virgel Manifold, MD;  Location: [facility] ENDOSCOPY;  Service: Endoscopy;  Laterality: N/A;  . CYST EXCISION  2011   from throat  . ESOPHAGOGASTRODUODENOSCOPY N/A 08/31/2014   Procedure: ESOPHAGOGASTRODUODENOSCOPY (EGD);  Surgeon: Lucilla Lame, MD;   Location: [place];  Service: Gastroenterology;  Laterality: N/A;  . ESOPHAGOGASTRODUODENOSCOPY N/A 12/21/2018   Procedure: ESOPHAGOGASTRODUODENOSCOPY (EGD);  Surgeon: Virgel Manifold, MD;  Location: [facility] ENDOSCOPY;  Service: Endoscopy;  Laterality: N/A;  . ESOPHAGOGASTRODUODENOSCOPY (EGD) WITH PROPOFOL N/A 07/01/2017   Procedure: ESOPHAGOGASTRODUODENOSCOPY (EGD) WITH PROPOFOL;  Surgeon: [name], MD;  Location: [facility] ENDOSCOPY;  Service: Gastroenterology;  Laterality: N/A;  . FLEXIBLE SIGMOIDOSCOPY N/A 07/01/2017   Procedure: FLEXIBLE SIGMOIDOSCOPY;  Surgeon: [name], MD;  Location: [facility] ENDOSCOPY;  Service: Gastroenterology;  Laterality: N/A;  . GIVENS CAPSULE STUDY  12/22/2018   Procedure: GIVENS CAPSULE STUDY;  Surgeon: Virgel Manifold, MD;  Location: [facility] ENDOSCOPY;  Service: Endoscopy;;  . HEMORRHOID SURGERY    . INCISION AND DRAINAGE HIP Right 11/25/2014   Procedure: IRRIGATION  AND DRAINAGEOF RIGHT HIP WITH PLACEMENT OF ANTIBIOUTIC BEADS;  Surgeon: Mcarthur Rossetti, MD;  Location: [place];  Service: Orthopedics;  Laterality: Right;  . INCISION AND DRAINAGE HIP Right 11/28/2014   Procedure: Repeat I&D Right Hip;  Surgeon: Mcarthur Rossetti, MD;  Location: [place];  Service: Orthopedics;  Laterality: Right;  . JOINT REPLACEMENT Right 2007   hip  . LOOP RECORDER INSERTION N/A 04/04/2019   Procedure: LOOP RECORDER INSERTION;  Surgeon: Isaias Cowman, MD;  Location: [place] CV LAB;  Service: Cardiovascular;  Laterality: N/A;  . TOTAL HIP REVISION Right 03/19/2015   Procedure: RIGHT TOTAL HIP ARTHROPLASTY REVISION;  Surgeon: Meredith Pel, MD;  Location: [place];  Service: Orthopedics;  Laterality: Right;  . TOTAL HIP REVISION Right 11/18/2015   Procedure: TOTAL HIP REVISION;  Surgeon: Frederik Pear, MD;  Location: [place];  Service: Orthopedics;  Laterality: Right;    Medical History: Past Medical History:  Diagnosis Date  . Anemia   . Anxiety   .  Arthritis   . Barrett esophagus   . Cancer ([place]) 2016    Skin cancer right leg mylenoma  . Depression   . Diabetes ([place]) 04/25/2017  . GERD (gastroesophageal reflux disease)   . History of blood transfusion    with a surgery  . History of bronchitis   . History of pneumonia   . Hypertension   . IBS (irritable bowel syndrome)   . Pneumonia 09/27/15  . PTSD (post-traumatic stress disorder)   . Renal insufficiency    reports acute kidney injury  . Seizures ([place])    rare - last one 07/30/14 - was very anemic.  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 Vertigo    thinks related to sinus issues    Family History: Family History  Problem Relation Age of Onset  . Alcoholism Father   . Throat cancer Mother     Social History   Socioeconomic History  . Marital status: Single    Spouse name: Not on file  . Number of children: Not on file  . Years of education: Not on file  .  Highest education level: Not on file  Occupational History  . Not on file  Tobacco Use  . Smoking status: Never Smoker  . Smokeless tobacco: Never Used  Vaping Use  . Vaping Use: Never used  Substance and Sexual Activity  . Alcohol use: No    Alcohol/week: 0.0 standard drinks  . Drug use: No  . Sexual activity: Not Currently  Other Topics Concern  . Not on file  Social History Narrative  . Not on file   Social Determinants of Health   Financial Resource Strain:   . Difficulty of Paying Living Expenses: Not on file  Food Insecurity:   . Worried About Charity fundraiser in the Last Year: Not on file  . Ran Out of Food in the Last Year: Not on file  Transportation Needs:   . Lack of Transportation (Medical): Not on file  . Lack of Transportation (Non-Medical): Not on file  Physical Activity:   . Days of Exercise per Week: Not on file  . Minutes of Exercise per Session: Not on file  Stress:   . Feeling of Stress : Not on file  Social Connections:   . Frequency of Communication with Friends and Family:  Not on file  . Frequency of Social Gatherings with Friends and Family: Not on file  . Attends Religious Services: Not on file  . Active Member of Clubs or Organizations: Not on file  . Attends Archivist Meetings: Not on file  . Marital Status: Not on file  Intimate Partner Violence:   . Fear of Current or Ex-Partner: Not on file  . Emotionally Abused: Not on file  . Physically Abused: Not on file  . Sexually Abused: Not on file      Review of Systems  Constitutional: Positive for fatigue. Negative for activity change, chills and unexpected weight change.  HENT: Negative for congestion, postnasal drip, rhinorrhea, sneezing and sore throat.   Respiratory: Negative for cough, chest tightness and shortness of breath.   Cardiovascular: Negative for chest pain and palpitations.  Gastrointestinal: Negative for abdominal pain, constipation, diarrhea, nausea and vomiting.  Endocrine: Negative for cold intolerance, heat intolerance, polydipsia and polyuria.       Recent lab check from home visit by nurse practitioner indicates very high blood sugar and HgbA1c.   Musculoskeletal: Negative for arthralgias, back pain, joint swelling and neck pain.  Skin: Negative for rash.  Allergic/Immunologic: Negative for environmental allergies.  Neurological: Positive for seizures and weakness. Negative for tremors and numbness.       Chronic and currently well managed.   Hematological: Negative for adenopathy. Does not bruise/bleed easily.  Psychiatric/Behavioral: Negative for behavioral problems (Depression), sleep disturbance and suicidal ideas. The patient is not nervous/anxious.     Vital Signs: There were no vitals taken for this visit.   Observation/Objective:   The patient is alert and oriented. She is pleasant and answers all questions appropriately. Breathing is non-labored. She is in no acute distress at this time.    Assessment/Plan: 1. Uncontrolled type 2 diabetes mellitus  with hyperglycemia ([place]) Recent check of labs per home nurse practitioner indicated high blood sugar and elevated HgbA1c. Will start on farxiga 10mg  daily. Will check full routine, fasting labs. Will get patient a glucose monitor and provide instructions for use at home. Will see her back promptly to review labs.   2. Low back pain of thoracolumbar region with sciatica May take flexeril 10mg  as needed and as prescribed. New prescription  provided today.  - cyclobenzaprine (FLEXERIL) 10 MG tablet; Take 1 tablet (10 mg total) by mouth 3 (three) times daily as needed for muscle spasms.  Dispense: 90 tablet; Refill: 3  3. Seizure disorder ([place]) Continue medication and regular visits with neurology as scheduled.   General Counseling: kataleia quaranta understanding of the findings of today's phone visit and agrees with plan of treatment. I have discussed any further diagnostic evaluation that may be needed or ordered today. We also reviewed her medications today. she has been encouraged to call the office with any questions or concerns that should arise related to todays visit.  Diabetes Counseling:  1. Addition of ACE inh/ ARB'S for nephroprotection. Microalbumin is updated  2. Diabetic foot care, prevention of complications. Podiatry consult 3. Exercise and lose weight.  4. Diabetic eye examination, Diabetic eye exam is updated  5. Monitor blood sugar closlely. nutrition counseling.  6. Sign and symptoms of hypoglycemia including shaking sweating,confusion and headaches.   This patient was seen by [place] with Dr [name] as a part of collaborative care agreement  Meds ordered this encounter  Medications  . DISCONTD: dapagliflozin propanediol (FARXIGA) 5 MG TABS tablet    Sig: Take 1 tablet (5 mg total) by mouth daily before breakfast.    Dispense:  30 tablet    Refill:  2    New diagnosis of diabetes. HgbA1c 13.0    Order Specific Question:   Supervising Provider     Answer:   [name] [[id number]]  . cyclobenzaprine (FLEXERIL) 10 MG tablet    Sig: Take 1 tablet (10 mg total) by mouth 3 (three) times daily as needed for muscle spasms.    Dispense:  90 tablet    Refill:  3    Order Specific Question:   Supervising Provider    Answer:   [name] [[id number]]    Time spent: 28 Minutes    Dr [name] Internal medicine

## 2020-01-10 ENCOUNTER — Telehealth: Payer: Self-pay

## 2020-01-11 ENCOUNTER — Telehealth: Payer: Self-pay

## 2020-01-11 ENCOUNTER — Other Ambulatory Visit: Payer: Self-pay

## 2020-01-11 MED ORDER — ACCU-CHEK GUIDE VI STRP: ORAL_STRIP | 1 refills | Status: DC

## 2020-01-11 MED ORDER — ACCU-CHEK SOFTCLIX LANCETS MISC: 3 refills | Status: DC

## 2020-01-11 NOTE — Telephone Encounter (Signed)
Spoke with pt daughter that she can come pick up glucometer  and also we send test strips and lancets to [facility] and also I cal her back when labslips is ready for pickup

## 2020-01-11 NOTE — Telephone Encounter (Signed)
She needs a three week follow up from when I talked to her. Thanks.

## 2020-01-11 NOTE — Telephone Encounter (Signed)
Labs ordered, pt needs follow up, can be virtual. No follow up app made on previous visit

## 2020-01-11 NOTE — Telephone Encounter (Signed)
Spoke with pt daughter and advised labs slip ready for pickup with glucometer and also her mom need follow up appt

## 2020-01-11 NOTE — Telephone Encounter (Signed)
PLEASE CALL PT

## 2020-01-22 ENCOUNTER — Other Ambulatory Visit: Payer: Self-pay

## 2020-01-22 ENCOUNTER — Other Ambulatory Visit
Admission: RE | Admit: 2020-01-22 | Discharge: 2020-01-22 | Disposition: A | Discharge status: Home / Self Care | Payer: Medicare Other | Attending: Nurse Practitioner | Admitting: Nurse Practitioner

## 2020-01-22 ENCOUNTER — Telehealth: Payer: Self-pay

## 2020-01-22 ENCOUNTER — Other Ambulatory Visit: Payer: Self-pay | Admitting: Nurse Practitioner

## 2020-01-22 DIAGNOSIS — Z0001 Encounter for general adult medical examination with abnormal findings: Secondary | ICD-10-CM | POA: Insufficient documentation

## 2020-01-22 DIAGNOSIS — I10 Essential (primary) hypertension: Secondary | ICD-10-CM | POA: Diagnosis not present

## 2020-01-22 DIAGNOSIS — E1165 Type 2 diabetes mellitus with hyperglycemia: Secondary | ICD-10-CM

## 2020-01-22 DIAGNOSIS — E559 Vitamin D deficiency, unspecified: Secondary | ICD-10-CM | POA: Insufficient documentation

## 2020-01-22 DIAGNOSIS — E119 Type 2 diabetes mellitus without complications: Secondary | ICD-10-CM | POA: Diagnosis not present

## 2020-01-22 LAB — LIPID PANEL
Cholesterol: 171 mg/dL (ref 0–200)
HDL: 35 mg/dL — ABNORMAL LOW (ref >40)
LDL Cholesterol: 91 mg/dL (ref 0–99)
Total CHOL/HDL Ratio: 4.9 RATIO
Triglycerides: 224 mg/dL — ABNORMAL HIGH (ref <150)
VLDL: 45 mg/dL — ABNORMAL HIGH (ref 0–40)

## 2020-01-22 LAB — VITAMIN D 25 HYDROXY (VIT D DEFICIENCY, FRACTURES): Vit D, 25-Hydroxy: 46.18 ng/mL (ref 30–100)

## 2020-01-22 LAB — COMPREHENSIVE METABOLIC PANEL
ALT: 24 U/L (ref 0–44)
AST: 23 U/L (ref 15–41)
Albumin: 4.3 g/dL (ref 3.5–5.0)
Alkaline Phosphatase: 82 U/L (ref 38–126)
Anion gap: 10 (ref 5–15)
BUN: 18 mg/dL (ref 8–23)
CO2: 26 mmol/L (ref 22–32)
Calcium: 9 mg/dL (ref 8.9–10.3)
Chloride: 98 mmol/L (ref 98–111)
Creatinine, Ser: 1.1 mg/dL — ABNORMAL HIGH (ref 0.44–1.00)
GFR calc Af Amer: >60 mL/min (ref >60)
GFR calc non Af Amer: 54 mL/min — ABNORMAL LOW (ref >60)
Glucose, Bld: 200 mg/dL — ABNORMAL HIGH (ref 70–99)
Potassium: 4.2 mmol/L (ref 3.5–5.1)
Sodium: 134 mmol/L — ABNORMAL LOW (ref 135–145)
Total Bilirubin: 1 mg/dL (ref 0.3–1.2)
Total Protein: 7.3 g/dL (ref 6.5–8.1)

## 2020-01-22 LAB — CBC
HCT: 42.2 % (ref 36.0–46.0)
Hemoglobin: 13.9 g/dL (ref 12.0–15.0)
MCH: 28.1 pg (ref 26.0–34.0)
MCHC: 32.9 g/dL (ref 30.0–36.0)
MCV: 85.3 fL (ref 80.0–100.0)
Platelets: 166 10*3/uL (ref 150–400)
RBC: 4.95 MIL/uL (ref 3.87–5.11)
RDW: 13 % (ref 11.5–15.5)
WBC: 5.4 10*3/uL (ref 4.0–10.5)
nRBC: 0 % (ref 0.0–0.2)

## 2020-01-22 LAB — TSH: TSH: 2.457 u[IU]/mL (ref 0.350–4.500)

## 2020-01-22 LAB — HEMOGLOBIN A1C
Hgb A1c MFr Bld: 12.4 % — ABNORMAL HIGH (ref 4.8–5.6)
Mean Plasma Glucose: 309.18 mg/dL

## 2020-01-22 LAB — T4, FREE: Free T4: 0.89 ng/dL (ref 0.61–1.12)

## 2020-01-22 MED ORDER — METFORMIN HCL 500 MG PO TABS: 500.0000 mg | ORAL_TABLET | Freq: Two times a day (BID) | ORAL | 3 refills | Status: DC

## 2020-01-22 NOTE — Telephone Encounter (Signed)
Please tell her to Discontinue farxiaga due to causing blurry vision. Add metformin 500mg  twice daily. Advise her to check blood sugars closely and we will discus labs when results are available. thanks

## 2020-01-22 NOTE — Progress Notes (Signed)
Unable to tolerate low dose farxiga. Started metformin 500mg  bid today. Review labs at visit 02/02/2020

## 2020-01-22 NOTE — Progress Notes (Signed)
Discontinue farxiaga due to causing blurry vision. Add metformin 500mg  twice daily. Advise her to check blood sugars closely and we will discus labs when results are available.

## 2020-01-23 ENCOUNTER — Telehealth: Payer: Self-pay

## 2020-01-23 NOTE — Telephone Encounter (Signed)
The patient has appointment on 10/8 to discuss labs. This is time I have set aside to discuss the findings of all results.

## 2020-01-23 NOTE — Telephone Encounter (Signed)
Please call the pt that her blurred can be result of high sugar. She should call every 2-3 days with her diabetic numbers

## 2020-01-23 NOTE — Telephone Encounter (Signed)
Spoke to pt's daughter Vira Blanco and informed her that metformin was sent to pharmacy and that pt should check BS twice a day.  dbs

## 2020-01-23 NOTE — Telephone Encounter (Signed)
Spoke to pt's daughter Vira Blanco and informed her to have pt check BS twice daily and to keep records of them and the [facility] wants pt to record her BS's and call us to report them every 2-3 days.  I also told daughter to print off the daily diabetes meal planning guide to help pt plan good habits for her meals.  dbs

## 2020-01-23 NOTE — Telephone Encounter (Signed)
Review.

## 2020-01-25 ENCOUNTER — Ambulatory Visit: Payer: Medicare Other | Admitting: Nurse Practitioner

## 2020-01-26 ENCOUNTER — Telehealth: Payer: Self-pay

## 2020-01-29 ENCOUNTER — Other Ambulatory Visit: Payer: Self-pay | Admitting: Nurse Practitioner

## 2020-01-29 DIAGNOSIS — Z1231 Encounter for screening mammogram for malignant neoplasm of breast: Secondary | ICD-10-CM

## 2020-01-30 ENCOUNTER — Other Ambulatory Visit: Payer: Self-pay

## 2020-01-30 DIAGNOSIS — M792 Neuralgia and neuritis, unspecified: Secondary | ICD-10-CM

## 2020-01-30 MED ORDER — ATENOLOL 50 MG PO TABS
50.0000 mg | ORAL_TABLET | Freq: Two times a day (BID) | ORAL | 0 refills | Status: DC
Start: 2020-01-30 — End: 2020-03-05

## 2020-01-30 MED ORDER — GABAPENTIN 100 MG PO CAPS: 100.0000 mg | ORAL_CAPSULE | Freq: Two times a day (BID) | ORAL | 0 refills | Status: DC

## 2020-01-31 DIAGNOSIS — E1165 Type 2 diabetes mellitus with hyperglycemia: Secondary | ICD-10-CM | POA: Insufficient documentation

## 2020-02-01 NOTE — Telephone Encounter (Signed)
done

## 2020-02-02 ENCOUNTER — Ambulatory Visit (INDEPENDENT_AMBULATORY_CARE_PROVIDER_SITE_OTHER): Payer: Medicare Other | Admitting: Nurse Practitioner

## 2020-02-02 ENCOUNTER — Encounter: Payer: Self-pay | Admitting: Nurse Practitioner

## 2020-02-02 VITALS — Resp 16 | Ht 61.0 in | Wt 190.0 lb

## 2020-02-02 DIAGNOSIS — E1165 Type 2 diabetes mellitus with hyperglycemia: Secondary | ICD-10-CM

## 2020-02-02 DIAGNOSIS — E785 Hyperlipidemia, unspecified: Secondary | ICD-10-CM | POA: Diagnosis not present

## 2020-02-02 DIAGNOSIS — E1169 Type 2 diabetes mellitus with other specified complication: Secondary | ICD-10-CM

## 2020-02-02 DIAGNOSIS — I10 Essential (primary) hypertension: Secondary | ICD-10-CM | POA: Diagnosis not present

## 2020-02-02 MED ORDER — LISINOPRIL 5 MG PO TABS: 5.0000 mg | ORAL_TABLET | Freq: Every day | ORAL | 1 refills | Status: DC

## 2020-02-02 MED ORDER — ROSUVASTATIN CALCIUM 5 MG PO TABS: 5.0000 mg | ORAL_TABLET | Freq: Every day | ORAL | 1 refills | Status: DC

## 2020-02-02 NOTE — Progress Notes (Signed)
[facility] [place], [place] [address]  Internal MEDICINE  Telephone Visit  Patient Name: Christina French  [id number]  [id number]  Date of Service: 02/02/2020  I connected with the patient at 12:29pm by telephone and verified the patients identity using two identifiers.   I discussed the limitations, risks, security and privacy concerns of performing an evaluation and management service by telephone and the availability of in person appointments. I also discussed with the patient that there may be a patient responsible charge related to the service.  The patient expressed understanding and agrees to proceed.    Chief Complaint  Patient presents with  . Telephone Screen    telephone visit [phone]  . Telephone Assessment  . Follow-up    3 week fup review labs  . blood sugars    01/30/20 121 @ 530pm, 01/31/20 8 am 179. night 190, 02/01/20 830am 179 830pm 189, 02/02/20 188  . Anxiety    still has attacks    The patient has been contacted via telephone for follow up visit due to concerns for spread of novel coronavirus. She recently had labs done per nurse practitioner working for [employer]. During home visit, the patient's blood sugar and blood pressure were both very elevated. Since that visit, patient was initially started on Farxiga, but patient did not tolerate the medication well. She states that farxiga caused her to have blurry vision. She has been changed to metformin 500mg  twice daily. She is tolerating the medication well. She is checking her blood sugar twice daily and states that it is running between 150 and 200. She has no complaints of increased thirst or need to urinate. She states that she has an appointment with her eye doctor later this month. She is also scheduled to have a mammogram in early November. The patient had labs done at hospital outpatient lab. Her triglycerides are moderately elevated. Renal functions were  slightly abnormal. Her other labs were good. The patient reports feeling well and has no new concerns or complaints.       Current Medication: Outpatient Encounter Medications as of 02/02/2020  Medication Sig  . Accu-Chek Softclix Lancets lancets Use as instructed twice a day Dx E11.65  . acetaminophen (TYLENOL) 500 MG tablet Take 1,000 mg by mouth 2 (two) times daily as needed for mild pain or headache.  . ALPRAZolam (XANAX) 1 MG tablet Take 1 tablet (1 mg total) by mouth 3 (three) times daily as needed for anxiety.  Marland Kitchen aspirin EC 81 MG tablet Take 81 mg by mouth daily.  Marland Kitchen atenolol (TENORMIN) 50 MG tablet Take 1 tablet (50 mg total) by mouth 2 (two) times daily.  . cyclobenzaprine (FLEXERIL) 10 MG tablet Take 1 tablet (10 mg total) by mouth 3 (three) times daily as needed for muscle spasms.  Marland Kitchen escitalopram (LEXAPRO) 20 MG tablet Take 20 mg by mouth daily.   Marland Kitchen gabapentin (NEURONTIN) 100 MG capsule Take 1 capsule (100 mg total) by mouth 2 (two) times daily.  Marland Kitchen glucose blood (ACCU-CHEK GUIDE) test strip Use as directed twice a strips twice a day E 11.65  . Lacosamide (VIMPAT) 150 MG TABS Take 150 mg by mouth 2 (two) times daily.   Marland Kitchen levETIRAcetam (KEPPRA) 500 MG tablet Take 500 mg by mouth 2 (two) times daily.  . metFORMIN (GLUCOPHAGE) 500 MG tablet Take 1 tablet (500 mg total) by mouth 2 (two) times daily with a meal.  . mirtazapine (REMERON) 15 MG tablet  Take 30 mg by mouth at bedtime.   Marland Kitchen omeprazole (PRILOSEC) 40 MG capsule TAKE ONE CAPSULE BY MOUTH TWICE A DAY  . traZODone (DESYREL) 50 MG tablet Take 50 mg by mouth at bedtime.  . ferrous sulfate 325 (65 FE) MG EC tablet Take 1 tablet (325 mg total) by mouth 2 (two) times daily.  Marland Kitchen lisinopril (ZESTRIL) 5 MG tablet Take 1 tablet (5 mg total) by mouth daily.  . rosuvastatin (CRESTOR) 5 MG tablet Take 1 tablet (5 mg total) by mouth daily.   No facility-administered encounter medications on file as of 02/02/2020.    Surgical History: Past  Surgical History:  Procedure Laterality Date  . ABDOMINAL HYSTERECTOMY    . CESAREAN SECTION    . COLONOSCOPY N/A 08/31/2014   Procedure: COLONOSCOPY;  Surgeon: Lucilla Lame, MD;  Location: [place];  Service: Gastroenterology;  Laterality: N/A;  . COLONOSCOPY N/A 08/05/2016   Procedure: COLONOSCOPY;  Surgeon: [name], MD;  Location: [facility] ENDOSCOPY;  Service: Endoscopy;  Laterality: N/A;  . COLONOSCOPY WITH PROPOFOL N/A 12/22/2018   Procedure: COLONOSCOPY WITH PROPOFOL;  Surgeon: Virgel Manifold, MD;  Location: [facility] ENDOSCOPY;  Service: Endoscopy;  Laterality: N/A;  . CYST EXCISION  2011   from throat  . ESOPHAGOGASTRODUODENOSCOPY N/A 08/31/2014   Procedure: ESOPHAGOGASTRODUODENOSCOPY (EGD);  Surgeon: Lucilla Lame, MD;  Location: [place];  Service: Gastroenterology;  Laterality: N/A;  . ESOPHAGOGASTRODUODENOSCOPY N/A 12/21/2018   Procedure: ESOPHAGOGASTRODUODENOSCOPY (EGD);  Surgeon: Virgel Manifold, MD;  Location: [facility] ENDOSCOPY;  Service: Endoscopy;  Laterality: N/A;  . ESOPHAGOGASTRODUODENOSCOPY (EGD) WITH PROPOFOL N/A 07/01/2017   Procedure: ESOPHAGOGASTRODUODENOSCOPY (EGD) WITH PROPOFOL;  Surgeon: [name], MD;  Location: [facility] ENDOSCOPY;  Service: Gastroenterology;  Laterality: N/A;  . FLEXIBLE SIGMOIDOSCOPY N/A 07/01/2017   Procedure: FLEXIBLE SIGMOIDOSCOPY;  Surgeon: [name], MD;  Location: [facility] ENDOSCOPY;  Service: Gastroenterology;  Laterality: N/A;  . GIVENS CAPSULE STUDY  12/22/2018   Procedure: GIVENS CAPSULE STUDY;  Surgeon: Virgel Manifold, MD;  Location: [facility] ENDOSCOPY;  Service: Endoscopy;;  . HEMORRHOID SURGERY    . INCISION AND DRAINAGE HIP Right 11/25/2014   Procedure: IRRIGATION  AND DRAINAGEOF RIGHT HIP WITH PLACEMENT OF ANTIBIOUTIC BEADS;  Surgeon: Mcarthur Rossetti, MD;  Location: [place];  Service: Orthopedics;  Laterality: Right;  . INCISION AND DRAINAGE HIP Right 11/28/2014   Procedure: Repeat I&D Right Hip;  Surgeon: Mcarthur Rossetti, MD;  Location: [place];  Service: Orthopedics;  Laterality: Right;  . JOINT REPLACEMENT Right 2007   hip  . LOOP RECORDER INSERTION N/A 04/04/2019   Procedure: LOOP RECORDER INSERTION;  Surgeon: Isaias Cowman, MD;  Location: [place] CV [facility];  Service: Cardiovascular;  Laterality: N/A;  . TOTAL HIP REVISION Right 03/19/2015   Procedure: RIGHT TOTAL HIP ARTHROPLASTY REVISION;  Surgeon: Meredith Pel, MD;  Location: [place];  Service: Orthopedics;  Laterality: Right;  . TOTAL HIP REVISION Right 11/18/2015   Procedure: TOTAL HIP REVISION;  Surgeon: Frederik Pear, MD;  Location: [place] [facility];  Service: Orthopedics;  Laterality: Right;    Medical History: Past Medical History:  Diagnosis Date  . Anemia   . Anxiety   . Arthritis   . Barrett esophagus   . Cancer ([place]) 2016    Skin cancer right leg mylenoma  . Depression   . Diabetes ([place]) 04/25/2017  . GERD (gastroesophageal reflux disease)   . History of blood transfusion    with a surgery  . History of bronchitis   .  History of pneumonia   . Hypertension   . IBS (irritable bowel syndrome)   . Pneumonia 09/27/15  . PTSD (post-traumatic stress disorder)   . Renal insufficiency    reports acute kidney injury  . Seizures ([place])    rare - last one 07/30/14 - was very anemic.  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 Vertigo    thinks related to sinus issues    Family History: Family History  Problem Relation Age of Onset  . Alcoholism Father   . Throat cancer Mother     Social History   Socioeconomic History  . Marital status: Single    Spouse name: Not on file  . Number of children: Not on file  . Years of education: Not on file  . Highest education level: Not on file  Occupational History  . Not on file  Tobacco Use  . Smoking status: Never Smoker  . Smokeless tobacco: Never Used  Vaping Use  . Vaping Use: Never used  Substance and Sexual Activity  . Alcohol use: No    Alcohol/week: 0.0 standard drinks  . Drug use:  No  . Sexual activity: Not Currently  Other Topics Concern  . Not on file  Social History Narrative  . Not on file   Social Determinants of Health   Financial Resource Strain:   . Difficulty of Paying Living Expenses: Not on file  Food Insecurity:   . Worried About Charity fundraiser in the Last Year: Not on file  . Ran Out of Food in the Last Year: Not on file  Transportation Needs:   . Lack of Transportation (Medical): Not on file  . Lack of Transportation (Non-Medical): Not on file  Physical Activity:   . Days of Exercise per Week: Not on file  . Minutes of Exercise per Session: Not on file  Stress:   . Feeling of Stress : Not on file  Social Connections:   . Frequency of Communication with Friends and Family: Not on file  . Frequency of Social Gatherings with Friends and Family: Not on file  . Attends Religious Services: Not on file  . Active Member of Clubs or Organizations: Not on file  . Attends Archivist Meetings: Not on file  . Marital Status: Not on file  Intimate Partner Violence:   . Fear of Current or Ex-Partner: Not on file  . Emotionally Abused: Not on file  . Physically Abused: Not on file  . Sexually Abused: Not on file      Review of Systems  Constitutional: Negative for activity change, chills, fatigue and unexpected weight change.  HENT: Negative for congestion, postnasal drip, rhinorrhea, sneezing and sore throat.   Respiratory: Negative for cough, chest tightness, shortness of breath and wheezing.   Cardiovascular: Negative for chest pain and palpitations.  Gastrointestinal: Negative for abdominal pain, constipation, diarrhea, nausea and vomiting.  Endocrine: Negative for cold intolerance, heat intolerance, polydipsia and polyuria.       The blood sugars are moderately elevated, but gradually coming down since addition of metformin. She is tolerating the medication well.   Musculoskeletal: Positive for back pain. Negative for  arthralgias, joint swelling and neck pain.  Skin: Negative for rash.  Allergic/Immunologic: Negative for environmental allergies.  Neurological: Negative for dizziness, tremors, numbness and headaches.  Hematological: Negative for adenopathy. Does not bruise/bleed easily.  Psychiatric/Behavioral: Negative for behavioral problems (Depression), sleep disturbance and suicidal ideas. The patient is not nervous/anxious.  Today's Vitals   02/02/20 1015  Resp: 16  Weight: 190 lb (86.2 kg)  Height: 5\' 1"  (1.549 m)   Body mass index is 35.9 kg/m.  Observation/Objective:   The patient is alert and oriented. She is pleasant and answers all questions appropriately. Breathing is non-labored. She is in no acute distress at this time.    Assessment/Plan: 1. Uncontrolled type 2 diabetes mellitus with hyperglycemia (HCC) Current blood sugar running between 150 and 200. Her HgbA1c is 12.4. will continue metformin 500mg  twice daily. Monitor closely. Recheck at next, in-office visit.   2. Hyperlipidemia associated with type 2 diabetes mellitus ([place]) Reviewed labs. Elevated triglycerides. Start crestor 5mg  every evening. Discussed benefits of adding statin medication while diabetic. Discussed prudent diet with patient.  - rosuvastatin (CRESTOR) 5 MG tablet; Take 1 tablet (5 mg total) by mouth daily.  Dispense: 90 tablet; Refill: 1  3. Essential hypertension Add lisinopril 5mg  daily. Monitor blood pressure at home.  - lisinopril (ZESTRIL) 5 MG tablet; Take 1 tablet (5 mg total) by mouth daily.  Dispense: 90 tablet; Refill: 1  General Counseling: kafi dotter understanding of the findings of today's phone visit and agrees with plan of treatment. I have discussed any further diagnostic evaluation that may be needed or ordered today. We also reviewed her medications today. she has been encouraged to call the office with any questions or concerns that should arise related to todays  visit.   Diabetes Counseling:  1. Addition of ACE inh/ ARB'S for nephroprotection. Microalbumin is updated  2. Diabetic foot care, prevention of complications. Podiatry consult 3. Exercise and lose weight.  4. Diabetic eye examination, Diabetic eye exam is updated  5. Monitor blood sugar closlely. nutrition counseling.  6. Sign and symptoms of hypoglycemia including shaking sweating,confusion and headaches.  This patient was seen by [place] with Dr [name] as a part of collaborative care agreement  Meds ordered this encounter  Medications  . rosuvastatin (CRESTOR) 5 MG tablet    Sig: Take 1 tablet (5 mg total) by mouth daily.    Dispense:  90 tablet    Refill:  1    Order Specific Question:   Supervising Provider    Answer:   [name] [[id number]]  . lisinopril (ZESTRIL) 5 MG tablet    Sig: Take 1 tablet (5 mg total) by mouth daily.    Dispense:  90 tablet    Refill:  1    Order Specific Question:   Supervising Provider    Answer:   [name] [[id number]]    Time spent: 17 Minutes    Dr [name] Internal medicine

## 2020-02-07 ENCOUNTER — Other Ambulatory Visit: Payer: Self-pay

## 2020-02-07 MED ORDER — OMEPRAZOLE 40 MG PO CPDR: 40.0000 mg | DELAYED_RELEASE_CAPSULE | Freq: Two times a day (BID) | ORAL | 0 refills | Status: DC

## 2020-02-13 DIAGNOSIS — E119 Type 2 diabetes mellitus without complications: Secondary | ICD-10-CM | POA: Diagnosis not present

## 2020-02-28 ENCOUNTER — Other Ambulatory Visit: Payer: Self-pay

## 2020-02-28 ENCOUNTER — Ambulatory Visit
Admission: RE | Admit: 2020-02-28 | Discharge: 2020-02-28 | Disposition: A | Discharge status: Home / Self Care | Payer: Medicare Other | Source: Ambulatory Visit | Attending: Nurse Practitioner | Admitting: Nurse Practitioner

## 2020-02-28 DIAGNOSIS — Z1231 Encounter for screening mammogram for malignant neoplasm of breast: Secondary | ICD-10-CM | POA: Diagnosis not present

## 2020-03-01 NOTE — Progress Notes (Signed)
Negative mammogram

## 2020-03-05 ENCOUNTER — Other Ambulatory Visit: Payer: Self-pay

## 2020-03-05 DIAGNOSIS — M792 Neuralgia and neuritis, unspecified: Secondary | ICD-10-CM

## 2020-03-05 MED ORDER — GABAPENTIN 100 MG PO CAPS: 100.0000 mg | ORAL_CAPSULE | Freq: Two times a day (BID) | ORAL | 0 refills | Status: DC

## 2020-03-05 MED ORDER — ATENOLOL 50 MG PO TABS
50.0000 mg | ORAL_TABLET | Freq: Two times a day (BID) | ORAL | 2 refills | Status: DC
Start: 2020-03-05 — End: 2020-06-12

## 2020-03-28 DIAGNOSIS — Z79899 Other long term (current) drug therapy: Secondary | ICD-10-CM | POA: Diagnosis not present

## 2020-03-28 DIAGNOSIS — R569 Unspecified convulsions: Secondary | ICD-10-CM | POA: Diagnosis not present

## 2020-04-02 ENCOUNTER — Other Ambulatory Visit: Payer: Self-pay

## 2020-04-02 DIAGNOSIS — M792 Neuralgia and neuritis, unspecified: Secondary | ICD-10-CM

## 2020-04-02 MED ORDER — GABAPENTIN 100 MG PO CAPS: 100.0000 mg | ORAL_CAPSULE | Freq: Two times a day (BID) | ORAL | 0 refills | Status: DC

## 2020-04-02 NOTE — Telephone Encounter (Signed)
I think I did this just now. Can you double check?

## 2020-04-02 NOTE — Telephone Encounter (Signed)
Yes it is complete now.  Thank you

## 2020-04-18 ENCOUNTER — Ambulatory Visit: Payer: Medicare Other | Admitting: Nurse Practitioner

## 2020-05-01 ENCOUNTER — Other Ambulatory Visit: Payer: Self-pay

## 2020-05-01 DIAGNOSIS — M792 Neuralgia and neuritis, unspecified: Secondary | ICD-10-CM

## 2020-05-01 MED ORDER — GABAPENTIN 100 MG PO CAPS: 100.0000 mg | ORAL_CAPSULE | Freq: Two times a day (BID) | ORAL | 0 refills | Status: DC

## 2020-05-01 MED ORDER — OMEPRAZOLE 40 MG PO CPDR
40.0000 mg | DELAYED_RELEASE_CAPSULE | Freq: Two times a day (BID) | ORAL | 0 refills | Status: DC
Start: 2020-05-01 — End: 2020-08-08

## 2020-05-09 ENCOUNTER — Telehealth: Payer: Self-pay

## 2020-05-09 NOTE — Telephone Encounter (Signed)
Patient called requesting virtual appt for med refills, I advised pt since she has been vaccinated and the last few appts have been virtual its best for her to be seen in person, she stats she will call back. Christina French

## 2020-05-15 ENCOUNTER — Other Ambulatory Visit: Payer: Self-pay

## 2020-05-15 DIAGNOSIS — E1165 Type 2 diabetes mellitus with hyperglycemia: Secondary | ICD-10-CM

## 2020-05-15 MED ORDER — METFORMIN HCL 500 MG PO TABS: 500.0000 mg | ORAL_TABLET | Freq: Two times a day (BID) | ORAL | 3 refills | Status: DC

## 2020-05-21 ENCOUNTER — Ambulatory Visit: Payer: Medicare Other | Admitting: Internal Medicine

## 2020-05-30 ENCOUNTER — Encounter: Payer: Self-pay | Admitting: Physician Assistant

## 2020-05-30 ENCOUNTER — Other Ambulatory Visit: Payer: Self-pay

## 2020-05-30 ENCOUNTER — Ambulatory Visit (INDEPENDENT_AMBULATORY_CARE_PROVIDER_SITE_OTHER): Payer: Medicare Other | Admitting: Hospice and Palliative Medicine

## 2020-05-30 VITALS — BP 136/88 | HR 80 | Temp 96.7°F | Resp 16 | Ht 60.0 in | Wt 196.4 lb

## 2020-05-30 DIAGNOSIS — E1165 Type 2 diabetes mellitus with hyperglycemia: Secondary | ICD-10-CM

## 2020-05-30 DIAGNOSIS — Z6838 Body mass index (BMI) 38.0-38.9, adult: Secondary | ICD-10-CM

## 2020-05-30 DIAGNOSIS — M545 Low back pain, unspecified: Secondary | ICD-10-CM | POA: Diagnosis not present

## 2020-05-30 DIAGNOSIS — M5440 Lumbago with sciatica, unspecified side: Secondary | ICD-10-CM

## 2020-05-30 LAB — POCT GLYCOSYLATED HEMOGLOBIN (HGB A1C): Hemoglobin A1C: 6.6 % — AB (ref 4.0–5.6)

## 2020-05-30 MED ORDER — FREESTYLE LIBRE 2 SENSOR MISC: 2 refills | Status: DC

## 2020-05-30 MED ORDER — CYCLOBENZAPRINE HCL 10 MG PO TABS: 10.0000 mg | ORAL_TABLET | Freq: Three times a day (TID) | ORAL | 3 refills | Status: DC | PRN

## 2020-05-30 NOTE — Progress Notes (Signed)
[facility] [place],  [address]  Internal MEDICINE  Office Visit Note  Patient Name: Christina French  [id number]  [id number]  Date of Service: 06/03/2020  Chief Complaint  Patient presents with  . Follow-up    Discuss diabetic sensor   . Hypertension  . Gastroesophageal Reflux  . Depression  . Diabetes  . Anxiety  . Anemia    HPI Patient is here for routine follow-up Daughter in law is with her today for appointment She was recently diagnosed with T2DM--she is having issues with being able to monitor her glucose levels at home, she has a fear of blood and becomes very anxious when she has to check her glucose--would like to discuss self monitoring device Unable to tolerate Farxiga--caused blurry vision Currently she is taking Metformin 500 mg daily She her diagnosis she has drastically changed her diet--limiting her sugar and carb intake She has also started dancing daily for exercise--this has also been helping with her anxiety Since she has started dancing she is having lower back pain--feels that she pulled a muscle, pain is mild and intermittent  Current Medication: Outpatient Encounter Medications as of 05/30/2020  Medication Sig  . primidone (MYSOLINE) 50 MG tablet Take 50 mg by mouth daily.  . propranolol (INDERAL) 10 MG tablet Take 10 mg by mouth.  . [DISCONTINUED] Continuous Blood Gluc Sensor (FREESTYLE LIBRE 2 SENSOR) MISC by Does not apply route. Use a twice a day DX 11.65  . Accu-Chek Softclix Lancets lancets Use as instructed twice a day Dx E11.65  . acetaminophen (TYLENOL) 500 MG tablet Take 1,000 mg by mouth 2 (two) times daily as needed for mild pain or headache.  . ALPRAZolam (XANAX) 1 MG tablet Take 1 tablet (1 mg total) by mouth 3 (three) times daily as needed for anxiety.  Marland Kitchen aspirin EC 81 MG tablet Take 81 mg by mouth daily.  Marland Kitchen atenolol (TENORMIN) 50 MG tablet Take 1 tablet (50 mg total) by mouth 2 (two) times daily.  .  cyclobenzaprine (FLEXERIL) 10 MG tablet Take 1 tablet (10 mg total) by mouth 3 (three) times daily as needed for muscle spasms.  Marland Kitchen escitalopram (LEXAPRO) 20 MG tablet Take 20 mg by mouth daily.   . ferrous sulfate 325 (65 FE) MG EC tablet Take 1 tablet (325 mg total) by mouth 2 (two) times daily.  Marland Kitchen gabapentin (NEURONTIN) 100 MG capsule Take 1 capsule (100 mg total) by mouth 2 (two) times daily.  Marland Kitchen glucose blood (ACCU-CHEK GUIDE) test strip Use as directed twice a strips twice a day E 11.65  . Lacosamide (VIMPAT) 150 MG TABS Take 150 mg by mouth 2 (two) times daily.   Marland Kitchen levETIRAcetam (KEPPRA) 500 MG tablet Take 500 mg by mouth 2 (two) times daily.  Marland Kitchen lisinopril (ZESTRIL) 5 MG tablet Take 1 tablet (5 mg total) by mouth daily.  . metFORMIN (GLUCOPHAGE) 500 MG tablet Take 1 tablet (500 mg total) by mouth 2 (two) times daily with a meal.  . mirtazapine (REMERON) 15 MG tablet Take 30 mg by mouth at bedtime.   Marland Kitchen omeprazole (PRILOSEC) 40 MG capsule Take 1 capsule (40 mg total) by mouth 2 (two) times daily.  . rosuvastatin (CRESTOR) 5 MG tablet Take 1 tablet (5 mg total) by mouth daily.  . traZODone (DESYREL) 50 MG tablet Take 50 mg by mouth at bedtime.  . [DISCONTINUED] cyclobenzaprine (FLEXERIL) 10 MG tablet Take 1 tablet (10 mg total) by mouth 3 (three) times daily  as needed for muscle spasms.   No facility-administered encounter medications on file as of 05/30/2020.    Surgical History: Past Surgical History:  Procedure Laterality Date  . ABDOMINAL HYSTERECTOMY    . CESAREAN SECTION    . COLONOSCOPY N/A 08/31/2014   Procedure: COLONOSCOPY;  Surgeon: Lucilla Lame, MD;  Location: [place];  Service: Gastroenterology;  Laterality: N/A;  . COLONOSCOPY N/A 08/05/2016   Procedure: COLONOSCOPY;  Surgeon: [name], MD;  Location: [facility] ENDOSCOPY;  Service: Endoscopy;  Laterality: N/A;  . COLONOSCOPY WITH PROPOFOL N/A 12/22/2018   Procedure: COLONOSCOPY WITH PROPOFOL;  Surgeon: Virgel Manifold, MD;  Location: [facility] ENDOSCOPY;  Service: Endoscopy;  Laterality: N/A;  . CYST EXCISION  2011   from throat  . ESOPHAGOGASTRODUODENOSCOPY N/A 08/31/2014   Procedure: ESOPHAGOGASTRODUODENOSCOPY (EGD);  Surgeon: Lucilla Lame, MD;  Location: [place];  Service: Gastroenterology;  Laterality: N/A;  . ESOPHAGOGASTRODUODENOSCOPY N/A 12/21/2018   Procedure: ESOPHAGOGASTRODUODENOSCOPY (EGD);  Surgeon: Virgel Manifold, MD;  Location: [facility] ENDOSCOPY;  Service: Endoscopy;  Laterality: N/A;  . ESOPHAGOGASTRODUODENOSCOPY (EGD) WITH PROPOFOL N/A 07/01/2017   Procedure: ESOPHAGOGASTRODUODENOSCOPY (EGD) WITH PROPOFOL;  Surgeon: [name], MD;  Location[facility] ENDOSCOPY;  Service: Gastroenterology;  Laterality: N/A;  . FLEXIBLE SIGMOIDOSCOPY N/A 07/01/2017   Procedure: FLEXIBLE SIGMOIDOSCOPY;  Surgeon: [name], MD;  Location: [facility] ENDOSCOPY;  Service: Gastroenterology;  Laterality: N/A;  . GIVENS CAPSULE STUDY  12/22/2018   Procedure: GIVENS CAPSULE STUDY;  Surgeon: Virgel Manifold, MD;  Location: [facility] ENDOSCOPY;  Service: Endoscopy;;  . HEMORRHOID SURGERY    . INCISION AND DRAINAGE HIP Right 11/25/2014   Procedure: IRRIGATION  AND DRAINAGEOF RIGHT HIP WITH PLACEMENT OF ANTIBIOUTIC BEADS;  Surgeon: Mcarthur Rossetti, MD;  Location: [place];  Service: Orthopedics;  Laterality: Right;  . INCISION AND DRAINAGE HIP Right 11/28/2014   Procedure: Repeat I&D Right Hip;  Surgeon: Mcarthur Rossetti, MD;  Location: [place];  Service: Orthopedics;  Laterality: Right;  . JOINT REPLACEMENT Right 2007   hip  . LOOP RECORDER INSERTION N/A 04/04/2019   Procedure: LOOP RECORDER INSERTION;  Surgeon: Isaias Cowman, MD;  Location: [place] CV LAB;  Service: Cardiovascular;  Laterality: N/A;  . TOTAL HIP REVISION Right 03/19/2015   Procedure: RIGHT TOTAL HIP ARTHROPLASTY REVISION;  Surgeon: Meredith Pel, MD;  Location: [place];  Service: Orthopedics;  Laterality: Right;  . TOTAL HIP REVISION  Right 11/18/2015   Procedure: TOTAL HIP REVISION;  Surgeon: Frederik Pear, MD;  Location: [place];  Service: Orthopedics;  Laterality: Right;    Medical History: Past Medical History:  Diagnosis Date  . Anemia   . Anxiety   . Arthritis   . Barrett esophagus   . Cancer ([place]) 2016    Skin cancer right leg mylenoma  . Depression   . Diabetes ([place]) 04/25/2017  . GERD (gastroesophageal reflux disease)   . History of blood transfusion    with a surgery  . History of bronchitis   . History of pneumonia   . Hypertension   . IBS (irritable bowel syndrome)   . Pneumonia 09/27/15  . PTSD (post-traumatic stress disorder)   . Renal insufficiency    reports acute kidney injury  . Seizures ([place])    rare - last one 07/30/14 - was very anemic.  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 Vertigo    thinks related to sinus issues    Family History: Family History  Problem Relation Age of Onset  . Alcoholism  Father   . Throat cancer Mother   . Breast cancer Maternal Aunt     Social History   Socioeconomic History  . Marital status: Single    Spouse name: Not on file  . Number of children: Not on file  . Years of education: Not on file  . Highest education level: Not on file  Occupational History  . Not on file  Tobacco Use  . Smoking status: Never Smoker  . Smokeless tobacco: Never Used  Vaping Use  . Vaping Use: Never used  Substance and Sexual Activity  . Alcohol use: No    Alcohol/week: 0.0 standard drinks  . Drug use: No  . Sexual activity: Not Currently  Other Topics Concern  . Not on file  Social History Narrative  . Not on file   Social Determinants of Health   Financial Resource Strain: Not on file  Food Insecurity: Not on file  Transportation Needs: Not on file  Physical Activity: Not on file  Stress: Not on file  Social Connections: Not on file  Intimate Partner Violence: Not on file      Review of Systems  Constitutional: Negative for chills, diaphoresis and fatigue.   HENT: Negative for ear pain, postnasal drip and sinus pressure.   Eyes: Negative for photophobia, discharge, redness, itching and visual disturbance.  Respiratory: Negative for cough, shortness of breath and wheezing.   Cardiovascular: Negative for chest pain, palpitations and leg swelling.  Gastrointestinal: Negative for abdominal pain, constipation, diarrhea, nausea and vomiting.  Genitourinary: Negative for dysuria and flank pain.  Musculoskeletal: Positive for back pain. Negative for arthralgias, gait problem and neck pain.  Skin: Negative for color change.  Allergic/Immunologic: Negative for environmental allergies and food allergies.  Neurological: Negative for dizziness and headaches.  Hematological: Does not bruise/bleed easily.  Psychiatric/Behavioral: Negative for agitation, behavioral problems (depression) and hallucinations.    Vital Signs: BP 136/88   Pulse 80   Temp (!) 96.7 F (35.9 C)   Resp 16   Ht 5' (1.524 m)   Wt 196 lb 6.4 oz (89.1 kg)   SpO2 95%   BMI 38.36 kg/m    Physical Exam Vitals reviewed.  Constitutional:      Appearance: Normal appearance. She is obese.  Cardiovascular:     Rate and Rhythm: Normal rate and regular rhythm.     Pulses: Normal pulses.     Heart sounds: Normal heart sounds.  Pulmonary:     Effort: Pulmonary effort is normal.     Breath sounds: Normal breath sounds.  Abdominal:     General: Abdomen is flat.     Palpations: Abdomen is soft.  Musculoskeletal:        General: Normal range of motion.     Cervical back: Normal range of motion.  Skin:    General: Skin is warm.  Neurological:     General: No focal deficit present.     Mental Status: She is alert and oriented to person, place, and time. Mental status is at baseline.  Psychiatric:        Mood and Affect: Mood normal.        Behavior: Behavior normal.        Thought Content: Thought content normal.        Judgment: Judgment normal.    Assessment/Plan: 1.  Uncontrolled type 2 diabetes mellitus with hyperglycemia (HCC) A1C significantly improved today at 6.6 Set up with [facility] self monitoring device Praised lifestyle changes and encouraged  to continue with healthy eating and exercise Continue with Metformin at this time Will need updated labs - POCT HgB A1C  2. Acute bilateral low back pain without sciatica May use flexeril as needed for muscle cramping and low back pain--also encouraged use of acetaminophen as well as heat/ice therapy - cyclobenzaprine (FLEXERIL) 10 MG tablet; Take 1 tablet (10 mg total) by mouth 3 (three) times daily as needed for muscle spasms.  Dispense: 90 tablet; Refill: 3  3. Class 2 severe obesity due to excess calories with serious comorbidity and body mass index (BMI) of 38.0 to 38.9 in adult [facility]) Obesity Counseling: Risk Assessment: An assessment of behavioral risk factors was made today and includes lack of exercise sedentary lifestyle, lack of portion control and poor dietary habits.  Risk Modification Advice: She was counseled on portion control guidelines. Restricting daily caloric intake to 1800. The detrimental long term effects of obesity on her health and ongoing poor compliance was also discussed with the patient.  General Counseling: Christina French understanding of the findings of todays visit and agrees with plan of treatment. I have discussed any further diagnostic evaluation that may be needed or ordered today. We also reviewed her medications today. she has been encouraged to call the office with any questions or concerns that should arise related to todays visit.    Orders Placed This Encounter  Procedures  . POCT HgB A1C    Meds ordered this encounter  Medications  . cyclobenzaprine (FLEXERIL) 10 MG tablet    Sig: Take 1 tablet (10 mg total) by mouth 3 (three) times daily as needed for muscle spasms.    Dispense:  90 tablet    Refill:  3    Time spent: 30 Minutes Time spent  includes review of chart, medications, test results and follow-up plan with the patient.  This patient was seen by Theodoro Grist AGNP-C in Collaboration with Dr [name] as a part of collaborative care agreement     Tanna Furry. Krishon Adkison AGNP-C Internal medicine

## 2020-05-31 ENCOUNTER — Other Ambulatory Visit: Payer: Self-pay

## 2020-05-31 MED ORDER — DEXCOM G6 SENSOR MISC: 2 refills | Status: DC

## 2020-06-03 ENCOUNTER — Encounter: Payer: Self-pay | Admitting: Hospice and Palliative Medicine

## 2020-06-12 ENCOUNTER — Other Ambulatory Visit: Payer: Self-pay

## 2020-06-12 DIAGNOSIS — M792 Neuralgia and neuritis, unspecified: Secondary | ICD-10-CM

## 2020-06-12 MED ORDER — GABAPENTIN 100 MG PO CAPS: 100.0000 mg | ORAL_CAPSULE | Freq: Two times a day (BID) | ORAL | 0 refills | Status: DC

## 2020-06-12 MED ORDER — ATENOLOL 50 MG PO TABS: 50.0000 mg | ORAL_TABLET | Freq: Two times a day (BID) | ORAL | 2 refills | Status: DC

## 2020-06-14 HISTORY — PX: HEMORRHOID SURGERY: SHX153

## 2020-06-18 ENCOUNTER — Ambulatory Visit: Payer: Medicare Other | Admitting: Internal Medicine

## 2020-07-02 DIAGNOSIS — R569 Unspecified convulsions: Secondary | ICD-10-CM | POA: Diagnosis not present

## 2020-07-02 DIAGNOSIS — G4733 Obstructive sleep apnea (adult) (pediatric): Secondary | ICD-10-CM | POA: Diagnosis not present

## 2020-07-09 ENCOUNTER — Other Ambulatory Visit: Payer: Self-pay | Admitting: Hospice and Palliative Medicine

## 2020-07-09 DIAGNOSIS — M792 Neuralgia and neuritis, unspecified: Secondary | ICD-10-CM

## 2020-07-10 ENCOUNTER — Telehealth: Payer: Self-pay

## 2020-07-10 NOTE — Telephone Encounter (Signed)
Spoke to pt about DEXCOM sensor and advised she has to contact [employer] at [phone] and set up an account and place an order.  She is to give doctor info on who ordered the kit, then the company will send [place] information to send office notes and prescription and then they will mail pt the supplies

## 2020-07-30 ENCOUNTER — Other Ambulatory Visit: Payer: Self-pay | Admitting: Nurse Practitioner

## 2020-07-30 ENCOUNTER — Other Ambulatory Visit: Payer: Self-pay | Admitting: Internal Medicine

## 2020-07-30 DIAGNOSIS — E1169 Type 2 diabetes mellitus with other specified complication: Secondary | ICD-10-CM

## 2020-07-30 DIAGNOSIS — E785 Hyperlipidemia, unspecified: Secondary | ICD-10-CM

## 2020-07-30 DIAGNOSIS — I10 Essential (primary) hypertension: Secondary | ICD-10-CM

## 2020-08-07 ENCOUNTER — Other Ambulatory Visit: Payer: Self-pay | Admitting: Nurse Practitioner

## 2020-08-08 ENCOUNTER — Other Ambulatory Visit: Payer: Self-pay | Admitting: Internal Medicine

## 2020-08-27 ENCOUNTER — Ambulatory Visit (INDEPENDENT_AMBULATORY_CARE_PROVIDER_SITE_OTHER): Payer: Medicare Other | Admitting: Hospice and Palliative Medicine

## 2020-08-27 ENCOUNTER — Other Ambulatory Visit
Admission: RE | Admit: 2020-08-27 | Discharge: 2020-08-27 | Disposition: A | Discharge status: Home / Self Care | Payer: Medicare Other | Source: Ambulatory Visit | Attending: Hospice and Palliative Medicine | Admitting: Hospice and Palliative Medicine

## 2020-08-27 ENCOUNTER — Ambulatory Visit: Payer: Medicare Other | Admitting: Hospice and Palliative Medicine

## 2020-08-27 ENCOUNTER — Other Ambulatory Visit: Payer: Self-pay

## 2020-08-27 ENCOUNTER — Encounter: Payer: Self-pay | Admitting: Hospice and Palliative Medicine

## 2020-08-27 VITALS — BP 132/88 | HR 86 | Temp 98.3°F | Resp 16 | Ht 61.0 in | Wt 202.6 lb

## 2020-08-27 DIAGNOSIS — G8929 Other chronic pain: Secondary | ICD-10-CM

## 2020-08-27 DIAGNOSIS — Z0001 Encounter for general adult medical examination with abnormal findings: Secondary | ICD-10-CM

## 2020-08-27 DIAGNOSIS — Z79899 Other long term (current) drug therapy: Secondary | ICD-10-CM | POA: Diagnosis not present

## 2020-08-27 DIAGNOSIS — M545 Low back pain, unspecified: Secondary | ICD-10-CM

## 2020-08-27 DIAGNOSIS — R5383 Other fatigue: Secondary | ICD-10-CM | POA: Diagnosis not present

## 2020-08-27 DIAGNOSIS — R300 Dysuria: Secondary | ICD-10-CM

## 2020-08-27 DIAGNOSIS — E1165 Type 2 diabetes mellitus with hyperglycemia: Secondary | ICD-10-CM | POA: Insufficient documentation

## 2020-08-27 DIAGNOSIS — Z7984 Long term (current) use of oral hypoglycemic drugs: Secondary | ICD-10-CM | POA: Insufficient documentation

## 2020-08-27 DIAGNOSIS — I10 Essential (primary) hypertension: Secondary | ICD-10-CM | POA: Insufficient documentation

## 2020-08-27 LAB — CBC WITH DIFFERENTIAL/PLATELET
Abs Immature Granulocytes: 0.03 10*3/uL (ref 0.00–0.07)
Basophils Absolute: 0 10*3/uL (ref 0.0–0.1)
Basophils Relative: 0 %
Eosinophils Absolute: 0.2 10*3/uL (ref 0.0–0.5)
Eosinophils Relative: 3 %
HCT: 36 % (ref 36.0–46.0)
Hemoglobin: 12.9 g/dL (ref 12.0–15.0)
Immature Granulocytes: 0 %
Lymphocytes Relative: 22 %
Lymphs Abs: 1.6 10*3/uL (ref 0.7–4.0)
MCH: 28.6 pg (ref 26.0–34.0)
MCHC: 35.8 g/dL (ref 30.0–36.0)
MCV: 79.8 fL — ABNORMAL LOW (ref 80.0–100.0)
Monocytes Absolute: 0.5 10*3/uL (ref 0.1–1.0)
Monocytes Relative: 6 %
Neutro Abs: 5.3 10*3/uL (ref 1.7–7.7)
Neutrophils Relative %: 69 %
Platelets: 160 10*3/uL (ref 150–400)
RBC: 4.51 MIL/uL (ref 3.87–5.11)
RDW: 12.1 % (ref 11.5–15.5)
WBC: 7.6 10*3/uL (ref 4.0–10.5)
nRBC: 0 % (ref 0.0–0.2)

## 2020-08-27 LAB — COMPREHENSIVE METABOLIC PANEL
ALT: 17 U/L (ref 0–44)
AST: 20 U/L (ref 15–41)
Albumin: 4.5 g/dL (ref 3.5–5.0)
Alkaline Phosphatase: 82 U/L (ref 38–126)
Anion gap: 11 (ref 5–15)
BUN: 12 mg/dL (ref 8–23)
CO2: 25 mmol/L (ref 22–32)
Calcium: 9.3 mg/dL (ref 8.9–10.3)
Chloride: 93 mmol/L — ABNORMAL LOW (ref 98–111)
Creatinine, Ser: 0.8 mg/dL (ref 0.44–1.00)
GFR, Estimated: >60 mL/min (ref >60)
Glucose, Bld: 187 mg/dL — ABNORMAL HIGH (ref 70–99)
Potassium: 4.5 mmol/L (ref 3.5–5.1)
Sodium: 129 mmol/L — ABNORMAL LOW (ref 135–145)
Total Bilirubin: 0.8 mg/dL (ref 0.3–1.2)
Total Protein: 7.5 g/dL (ref 6.5–8.1)

## 2020-08-27 LAB — LIPID PANEL
Cholesterol: 145 mg/dL (ref 0–200)
HDL: 39 mg/dL — ABNORMAL LOW (ref >40)
LDL Cholesterol: 42 mg/dL (ref 0–99)
Total CHOL/HDL Ratio: 3.7 RATIO
Triglycerides: 322 mg/dL — ABNORMAL HIGH (ref <150)
VLDL: 64 mg/dL — ABNORMAL HIGH (ref 0–40)

## 2020-08-27 LAB — TSH: TSH: 2.397 u[IU]/mL (ref 0.350–4.500)

## 2020-08-27 LAB — POCT GLYCOSYLATED HEMOGLOBIN (HGB A1C): Hemoglobin A1C: 7.2 % — AB (ref 4.0–5.6)

## 2020-08-27 LAB — VITAMIN D 25 HYDROXY (VIT D DEFICIENCY, FRACTURES): Vit D, 25-Hydroxy: 29.77 ng/mL — ABNORMAL LOW (ref 30–100)

## 2020-08-27 LAB — HEMOGLOBIN A1C
Hgb A1c MFr Bld: 7.2 % — ABNORMAL HIGH (ref 4.8–5.6)
Mean Plasma Glucose: 159.94 mg/dL

## 2020-08-27 LAB — VITAMIN B12: Vitamin B-12: 727 pg/mL (ref 180–914)

## 2020-08-27 LAB — T4, FREE: Free T4: 0.96 ng/dL (ref 0.61–1.12)

## 2020-08-27 MED ORDER — GLIMEPIRIDE 2 MG PO TABS: 2.0000 mg | ORAL_TABLET | Freq: Every day | ORAL | 1 refills | Status: DC

## 2020-08-27 MED ORDER — CYCLOBENZAPRINE HCL 10 MG PO TABS: 10.0000 mg | ORAL_TABLET | Freq: Three times a day (TID) | ORAL | 3 refills | Status: DC | PRN

## 2020-08-27 NOTE — Progress Notes (Signed)
[facility] [place], [place] [address]  Internal MEDICINE  Office Visit Note  Patient Name: Christina French  [id number]  YD:[id number]  Date of Service: 08/29/2020  Chief Complaint  Patient presents with  . Medicare Wellness  . Anemia  . Diabetes  . Depression  . Hypertension  . Anxiety  . Gastroesophageal Reflux     HPI Pt is here for routine health maintenance examination Overall things have been going well Stays inside of her apartment most of the time, will have visits from her daughters every couple of weeks DM-does not routinely check her glucose levels at home Has been taking Metformin for several months Was tried on Farxiga but this caused blurred vision Followed by psychiatry for anxiety and depression--remains stable  Up to date on mammogram as well as colonoscopy Due for routine fasting labs  Current Medication: Outpatient Encounter Medications as of 08/27/2020  Medication Sig  . Accu-Chek Softclix Lancets lancets Use as instructed twice a day Dx E11.65  . acetaminophen (TYLENOL) 500 MG tablet Take 1,000 mg by mouth 2 (two) times daily as needed for mild pain or headache.  . ALPRAZolam (XANAX) 1 MG tablet Take 1 tablet (1 mg total) by mouth 3 (three) times daily as needed for anxiety.  Marland Kitchen aspirin EC 81 MG tablet Take 81 mg by mouth daily.  Marland Kitchen atenolol (TENORMIN) 50 MG tablet Take 1 tablet (50 mg total) by mouth 2 (two) times daily.  . Continuous Blood Gluc Sensor (DEXCOM G6 SENSOR) MISC Use every 14 days dx e11.65  . escitalopram (LEXAPRO) 20 MG tablet Take 20 mg by mouth daily.   Marland Kitchen gabapentin (NEURONTIN) 100 MG capsule TAKE ONE CAPSULE BY MOUTH TWICE A DAY  . glimepiride (AMARYL) 2 MG tablet Take 1 tablet (2 mg total) by mouth daily before breakfast.  . glucose blood (ACCU-CHEK GUIDE) test strip Use as directed twice a strips twice a day E 11.65  . Lacosamide 150 MG TABS Take 150 mg by mouth 2 (two) times daily.   Marland Kitchen levETIRAcetam (KEPPRA)  500 MG tablet Take 500 mg by mouth 2 (two) times daily.  Marland Kitchen lisinopril (ZESTRIL) 5 MG tablet TAKE 1 TABLET BY MOUTH DAILY  . metFORMIN (GLUCOPHAGE) 500 MG tablet Take 1 tablet (500 mg total) by mouth 2 (two) times daily with a meal.  . mirtazapine (REMERON) 15 MG tablet Take 30 mg by mouth at bedtime.   Marland Kitchen omeprazole (PRILOSEC) 40 MG capsule TAKE ONE CAPSULE BY MOUTH TWICE A DAY  . primidone (MYSOLINE) 50 MG tablet Take 50 mg by mouth daily.  . propranolol (INDERAL) 10 MG tablet Take 10 mg by mouth.  . rosuvastatin (CRESTOR) 5 MG tablet TAKE 1 TABLET BY MOUTH DAILY  . traZODone (DESYREL) 50 MG tablet Take 50 mg by mouth at bedtime.  . [DISCONTINUED] cyclobenzaprine (FLEXERIL) 10 MG tablet Take 1 tablet (10 mg total) by mouth 3 (three) times daily as needed for muscle spasms.  . cyclobenzaprine (FLEXERIL) 10 MG tablet Take 1 tablet (10 mg total) by mouth 3 (three) times daily as needed for muscle spasms.  . ferrous sulfate 325 (65 FE) MG EC tablet Take 1 tablet (325 mg total) by mouth 2 (two) times daily.   No facility-administered encounter medications on file as of 08/27/2020.    Surgical History: Past Surgical History:  Procedure Laterality Date  . ABDOMINAL HYSTERECTOMY    . CESAREAN SECTION    . COLONOSCOPY N/A 08/31/2014   Procedure: COLONOSCOPY;  Surgeon: Lucilla Lame, MD;  Location: [place];  Service: Gastroenterology;  Laterality: N/A;  . COLONOSCOPY N/A 08/05/2016   Procedure: COLONOSCOPY;  Surgeon: [name], MD;  Location: [facility] ENDOSCOPY;  Service: Endoscopy;  Laterality: N/A;  . COLONOSCOPY WITH PROPOFOL N/A 12/22/2018   Procedure: COLONOSCOPY WITH PROPOFOL;  Surgeon: Virgel Manifold, MD;  Location: [facility] ENDOSCOPY;  Service: Endoscopy;  Laterality: N/A;  . CYST EXCISION  2011   from throat  . ESOPHAGOGASTRODUODENOSCOPY N/A 08/31/2014   Procedure: ESOPHAGOGASTRODUODENOSCOPY (EGD);  Surgeon: Lucilla Lame, MD;  Location: [place];  Service: Gastroenterology;   Laterality: N/A;  . ESOPHAGOGASTRODUODENOSCOPY N/A 12/21/2018   Procedure: ESOPHAGOGASTRODUODENOSCOPY (EGD);  Surgeon: Virgel Manifold, MD;  Location[facility] ENDOSCOPY;  Service: Endoscopy;  Laterality: N/A;  . ESOPHAGOGASTRODUODENOSCOPY (EGD) WITH PROPOFOL N/A 07/01/2017   Procedure: ESOPHAGOGASTRODUODENOSCOPY (EGD) WITH PROPOFOL;  Surgeon: [name], MD;  Location: [facility] ENDOSCOPY;  Service: Gastroenterology;  Laterality: N/A;  . FLEXIBLE SIGMOIDOSCOPY N/A 07/01/2017   Procedure: FLEXIBLE SIGMOIDOSCOPY;  Surgeon: [name], MD;  Location: [facility] ENDOSCOPY;  Service: Gastroenterology;  Laterality: N/A;  . GIVENS CAPSULE STUDY  12/22/2018   Procedure: GIVENS CAPSULE STUDY;  Surgeon: Virgel Manifold, MD;  Location: [facility] ENDOSCOPY;  Service: Endoscopy;;  . HEMORRHOID SURGERY    . INCISION AND DRAINAGE HIP Right 11/25/2014   Procedure: IRRIGATION  AND DRAINAGEOF RIGHT HIP WITH PLACEMENT OF ANTIBIOUTIC BEADS;  Surgeon: Mcarthur Rossetti, MD;  Location: [place];  Service: Orthopedics;  Laterality: Right;  . INCISION AND DRAINAGE HIP Right 11/28/2014   Procedure: Repeat I&D Right Hip;  Surgeon: Mcarthur Rossetti, MD;  Location: [place];  Service: Orthopedics;  Laterality: Right;  . JOINT REPLACEMENT Right 2007   hip  . LOOP RECORDER INSERTION N/A 04/04/2019   Procedure: LOOP RECORDER INSERTION;  Surgeon: Isaias Cowman, MD;  Location: [place] [facility];  Service: Cardiovascular;  Laterality: N/A;  . TOTAL HIP REVISION Right 03/19/2015   Procedure: RIGHT TOTAL HIP ARTHROPLASTY REVISION;  Surgeon: Meredith Pel, MD;  Location: [place];  Service: Orthopedics;  Laterality: Right;  . TOTAL HIP REVISION Right 11/18/2015   Procedure: TOTAL HIP REVISION;  Surgeon: Frederik Pear, MD;  Location: [place];  Service: Orthopedics;  Laterality: Right;    Medical History: Past Medical History:  Diagnosis Date  . Anemia   . Anxiety   . Arthritis   . Barrett esophagus   . Cancer ([place]) 2016     Skin cancer right leg mylenoma  . Depression   . Diabetes ([place]) 04/25/2017  . GERD (gastroesophageal reflux disease)   . History of blood transfusion    with a surgery  . History of bronchitis   . History of pneumonia   . Hypertension   . IBS (irritable bowel syndrome)   . Pneumonia 09/27/15  . PTSD (post-traumatic stress disorder)   . Renal insufficiency    reports acute kidney injury  . Seizures ([place])    rare - last one 07/30/14 - was very anemic.  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 Vertigo    thinks related to sinus issues    Family History: Family History  Problem Relation Age of Onset  . Alcoholism Father   . Throat cancer Mother   . Breast cancer Maternal Aunt       Review of Systems  Constitutional: Negative for chills, diaphoresis and fatigue.  HENT: Negative for ear pain, postnasal drip and sinus pressure.   Eyes: Negative for photophobia, discharge, redness,  itching and visual disturbance.  Respiratory: Negative for cough, shortness of breath and wheezing.   Cardiovascular: Negative for chest pain, palpitations and leg swelling.  Gastrointestinal: Negative for abdominal pain, constipation, diarrhea, nausea and vomiting.  Genitourinary: Negative for dysuria and flank pain.  Musculoskeletal: Negative for arthralgias, back pain, gait problem and neck pain.  Skin: Negative for color change.  Allergic/Immunologic: Negative for environmental allergies and food allergies.  Neurological: Negative for dizziness and headaches.  Hematological: Does not bruise/bleed easily.  Psychiatric/Behavioral: Negative for agitation, behavioral problems (depression) and hallucinations.     Vital Signs: BP 132/88   Pulse 86   Temp 98.3 F (36.8 C)   Resp 16   Ht 5\' 1"  (1.549 m)   Wt 202 lb 9.6 oz (91.9 kg)   SpO2 97%   BMI 38.28 kg/m    Physical Exam Vitals reviewed.  Constitutional:      Appearance: She is obese.  Cardiovascular:     Rate and Rhythm: Normal rate and regular  rhythm.     Pulses: Normal pulses.     Heart sounds: Normal heart sounds.  Pulmonary:     Effort: Pulmonary effort is normal.     Breath sounds: Normal breath sounds.  Abdominal:     General: Abdomen is flat.     Palpations: Abdomen is soft.  Musculoskeletal:        General: Normal range of motion.     Cervical back: Normal range of motion.  Skin:    General: Skin is warm.  Neurological:     General: No focal deficit present.     Mental Status: She is alert and oriented to person, place, and time. Mental status is at baseline.  Psychiatric:        Mood and Affect: Mood normal.        Behavior: Behavior normal.        Thought Content: Thought content normal.        Judgment: Judgment normal.      LABS: Recent Results (from the past 2160 hour(s))  UA/M w/rflx Culture, Routine     Status: Abnormal   Collection Time: 08/27/20 11:10 AM   Specimen: Urine   Urine  Result Value Ref Range   Specific Gravity, UA 1.011 1.005 - 1.030   pH, UA 8.0 (H) 5.0 - 7.5   Color, UA Yellow Yellow   Appearance Ur Clear Clear   Leukocytes,UA Negative Negative   Protein,UA Negative Negative/Trace   Glucose, UA Negative Negative   Ketones, UA Negative Negative   RBC, UA Negative Negative   Bilirubin, UA Negative Negative   Urobilinogen, Ur 0.2 0.2 - 1.0 mg/dL   Nitrite, UA Negative Negative   Microscopic Examination Comment     Comment: Microscopic follows if indicated.   Microscopic Examination See below:     Comment: Microscopic was indicated and was performed.   Urinalysis Reflex Comment     Comment: This specimen will not reflex to a Urine Culture.  Microscopic Examination     Status: None   Collection Time: 08/27/20 11:10 AM   Urine  Result Value Ref Range   WBC, UA None seen 0 - 5 /hpf   RBC None seen 0 - 2 /hpf   Epithelial Cells (non renal) 0-10 0 - 10 /hpf   Casts None seen None seen /lpf   Bacteria, UA None seen None seen/Few  Vitamin B12     Status: None   Collection  Time: 08/27/20 12:26 PM  Result Value Ref Range   Vitamin B-12 727 180 - 914 pg/mL    Comment: (NOTE) This assay is not validated for testing neonatal or myeloproliferative syndrome specimens for Vitamin B12 levels. Performed at [facility], [place] [address], [place], [place] [address]   VITAMIN D 25 Hydroxy (Vit-D Deficiency, Fractures)     Status: Abnormal   Collection Time: 08/27/20 12:26 PM  Result Value Ref Range   Vit D, 25-Hydroxy 29.77 (L) 30 - 100 ng/mL    Comment: (NOTE) Vitamin D deficiency has been defined by the Institute of Medicine  and an Endocrine Society practice guideline as a level of serum 25-OH  vitamin D less than 20 ng/mL (1,2). The Endocrine Society went on to  further define vitamin D insufficiency as a level between 21 and 29  ng/mL (2).  1. IOM (Institute of Medicine). 2010. Dietary reference intakes for  calcium and D. [place]: The [employer]. 2. Holick MF, Binkley Dearing, Bischoff-Ferrari HA, et al. Evaluation,  treatment, and prevention of vitamin D deficiency: an Endocrine  Society clinical practice guideline, JCEM. 2011 Jul; 96(7): 1911-30.  Performed at [facility], [place] [address], [place], [place] [address]   TSH     Status: None   Collection Time: 08/27/20 12:26 PM  Result Value Ref Range   TSH 2.397 0.350 - 4.500 uIU/mL    Comment: Performed by a 3rd Generation assay with a functional sensitivity of <=0.01 uIU/mL. Performed at [facility], [address] [place],  [address]   T4, free     Status: None   Collection Time: 08/27/20 12:26 PM  Result Value Ref Range   Free T4 0.96 0.61 - 1.12 ng/dL    Comment: (NOTE) Biotin ingestion may interfere with free T4 tests. If the results are inconsistent with the TSH level, previous test results, or the clinical presentation, then consider biotin interference. If needed, order repeat testing after stopping biotin. Performed at [facility],  [address] [place],  [address]   Lipid panel     Status: Abnormal   Collection Time: 08/27/20 12:26 PM  Result Value Ref Range   Cholesterol 145 0 - 200 mg/dL   Triglycerides 322 (H) <150 mg/dL   HDL 39 (L) >40 mg/dL   Total CHOL/HDL Ratio 3.7 RATIO   VLDL 64 (H) 0 - 40 mg/dL   LDL Cholesterol 42 0 - 99 mg/dL    Comment:        Total Cholesterol/HDL:CHD Risk Coronary Heart Disease Risk Table                     Men   Women  1/2 Average Risk   3.4   3.3  Average Risk       5.0   4.4  2 X Average Risk   9.6   7.1  3 X Average Risk  23.4   11.0        Use the calculated Patient Ratio above and the CHD Risk Table to determine the patient's CHD Risk.        ATP III CLASSIFICATION (LDL):  <100     mg/dL   Optimal  100-129  mg/dL   Near or Above                    Optimal  130-159  mg/dL   Borderline  160-189  mg/dL   High  >190  mg/dL   Very High Performed at [facility], [address] [place], [place] [address]   Comprehensive metabolic panel     Status: Abnormal   Collection Time: 08/27/20 12:26 PM  Result Value Ref Range   Sodium 129 (L) 135 - 145 mmol/L   Potassium 4.5 3.5 - 5.1 mmol/L   Chloride 93 (L) 98 - 111 mmol/L   CO2 25 22 - 32 mmol/L   Glucose, Bld 187 (H) 70 - 99 mg/dL    Comment: Glucose reference range applies only to samples taken after fasting for at least 8 hours.   BUN 12 8 - 23 mg/dL   Creatinine, Ser 2.03 0.44 - 1.00 mg/dL   Calcium 9.3 8.9 - 55.9 mg/dL   Total Protein 7.5 6.5 - 8.1 g/dL   Albumin 4.5 3.5 - 5.0 g/dL   AST 20 15 - 41 U/L   ALT 17 0 - 44 U/L   Alkaline Phosphatase 82 38 - 126 U/L   Total Bilirubin 0.8 0.3 - 1.2 mg/dL   GFR, Estimated >74 >16 mL/min    Comment: (NOTE) Calculated using the CKD-EPI Creatinine Equation (2021)    Anion gap 11 5 - 15    Comment: Performed at [facility], [address] [place], [place] [address]  CBC with Differential/Platelet     Status: Abnormal   Collection  Time: 08/27/20 12:26 PM  Result Value Ref Range   WBC 7.6 4.0 - 10.5 K/uL   RBC 4.51 3.87 - 5.11 MIL/uL   Hemoglobin 12.9 12.0 - 15.0 g/dL   HCT 64.6 80.3 - 21.2 %   MCV 79.8 (L) 80.0 - 100.0 fL   MCH 28.6 26.0 - 34.0 pg   MCHC 35.8 30.0 - 36.0 g/dL   RDW 24.8 25.0 - 03.7 %   Platelets 160 150 - 400 K/uL   nRBC 0.0 0.0 - 0.2 %   Neutrophils Relative % 69 %   Neutro Abs 5.3 1.7 - 7.7 K/uL   Lymphocytes Relative 22 %   Lymphs Abs 1.6 0.7 - 4.0 K/uL   Monocytes Relative 6 %   Monocytes Absolute 0.5 0.1 - 1.0 K/uL   Eosinophils Relative 3 %   Eosinophils Absolute 0.2 0.0 - 0.5 K/uL   Basophils Relative 0 %   Basophils Absolute 0.0 0.0 - 0.1 K/uL   Immature Granulocytes 0 %   Abs Immature Granulocytes 0.03 0.00 - 0.07 K/uL    Comment: Performed at [facility], [address] [place], [place] [address]  Hemoglobin A1c     Status: Abnormal   Collection Time: 08/27/20 12:26 PM  Result Value Ref Range   Hgb A1c MFr Bld 7.2 (H) 4.8 - 5.6 %    Comment: (NOTE) Pre diabetes:          5.7%-6.4%  Diabetes:              >6.4%  Glycemic control for   <7.0% adults with diabetes    Mean Plasma Glucose 159.94 mg/dL    Comment: Performed at [facility], [address], [place], [place] [address]  POCT HgB A1C     Status: Abnormal   Collection Time: 08/27/20  2:14 PM  Result Value Ref Range   Hemoglobin A1C 7.2 (A) 4.0 - 5.6 %   HbA1c POC (<> result, manual entry)     HbA1c, POC (prediabetic range)     HbA1c, POC (controlled diabetic range)     Assessment/Plan: 1.  Encounter for routine adult health examination with abnormal findings Well appearing 65 year old female Up to date on PHM  2. Uncontrolled type 2 diabetes mellitus with hyperglycemia (HCC) A1C elevated at 7.2 Add Amaryl daily with biggest meal of the day - POCT HgB A1C - glimepiride (AMARYL) 2 MG tablet; Take 1 tablet (2 mg total) by mouth daily before breakfast.  Dispense: 90 tablet; Refill: 1  3.  Essential hypertension BP and HR remain well controlled on current management, continue to monitor  4. Chronic bilateral low back pain without sciatica May continue with Flexeril as needed - cyclobenzaprine (FLEXERIL) 10 MG tablet; Take 1 tablet (10 mg total) by mouth 3 (three) times daily as needed for muscle spasms.  Dispense: 90 tablet; Refill: 3  5. Dysuria - UA/M w/rflx Culture, Routine - Microscopic Examination  6. Other fatigue - CBC w/Diff/Platelet - Comprehensive Metabolic Panel (CMET) - Lipid Panel With LDL/HDL Ratio - TSH + free T4 - Vitamin D (25 hydroxy) - B12  General Counseling: Barby verbalizes understanding of the findings of todays visit and agrees with plan of treatment. I have discussed any further diagnostic evaluation that may be needed or ordered today. We also reviewed her medications today. she has been encouraged to call the office with any questions or concerns that should arise related to todays visit.    Counseling:    Orders Placed This Encounter  Procedures  . Microscopic Examination  . CBC w/Diff/Platelet  . Comprehensive Metabolic Panel (CMET)  . Lipid Panel With LDL/HDL Ratio  . TSH + free T4  . Vitamin D (25 hydroxy)  . B12  . UA/M w/rflx Culture, Routine  . POCT HgB A1C    Meds ordered this encounter  Medications  . glimepiride (AMARYL) 2 MG tablet    Sig: Take 1 tablet (2 mg total) by mouth daily before breakfast.    Dispense:  90 tablet    Refill:  1  . cyclobenzaprine (FLEXERIL) 10 MG tablet    Sig: Take 1 tablet (10 mg total) by mouth 3 (three) times daily as needed for muscle spasms.    Dispense:  90 tablet    Refill:  3    Total time spent: 30 Minutes  Time spent includes review of chart, medications, test results, and follow up plan with the patient.   This patient was seen by Theodoro Grist AGNP-C Collaboration with Dr [name] as a part of collaborative care agreement   Tanna Furry. [facility] Internal  Medicine

## 2020-08-28 ENCOUNTER — Telehealth: Payer: Self-pay

## 2020-08-28 LAB — UA/M W/RFLX CULTURE, ROUTINE
Bilirubin, UA: NEGATIVE
Glucose, UA: NEGATIVE
Ketones, UA: NEGATIVE
Leukocytes,UA: NEGATIVE
Nitrite, UA: NEGATIVE
Protein,UA: NEGATIVE
RBC, UA: NEGATIVE
Specific Gravity, UA: 1.011 (ref 1.005–1.030)
Urobilinogen, Ur: 0.2 mg/dL (ref 0.2–1.0)
pH, UA: 8 — ABNORMAL HIGH (ref 5.0–7.5)

## 2020-08-28 LAB — MICROSCOPIC EXAMINATION
Bacteria, UA: NONE SEEN
Casts: NONE SEEN /lpf
RBC, Urine: NONE SEEN /hpf (ref 0–2)
WBC, UA: NONE SEEN /hpf (ref 0–5)

## 2020-08-28 NOTE — Progress Notes (Signed)
Labs reviewed, will set up in office visit to discuss abnormalities.

## 2020-08-28 NOTE — Telephone Encounter (Signed)
Spoke to pt and asked to set up appt to discuss labs, pt will get with her daughter and then call back to schedule appt.

## 2020-08-29 ENCOUNTER — Encounter: Payer: Self-pay | Admitting: Hospice and Palliative Medicine

## 2020-09-03 ENCOUNTER — Other Ambulatory Visit: Payer: Self-pay | Admitting: Internal Medicine

## 2020-09-03 ENCOUNTER — Other Ambulatory Visit: Payer: Self-pay | Admitting: Hospice and Palliative Medicine

## 2020-09-03 DIAGNOSIS — M792 Neuralgia and neuritis, unspecified: Secondary | ICD-10-CM

## 2020-09-03 DIAGNOSIS — E1165 Type 2 diabetes mellitus with hyperglycemia: Secondary | ICD-10-CM

## 2020-09-05 ENCOUNTER — Telehealth: Payer: Self-pay | Admitting: Internal Medicine

## 2020-09-05 NOTE — Progress Notes (Signed)
  Chronic Care Management   Outreach Note  09/05/2020 Name: Christina French MRN: [id number] DOB: 03/28/1956  Referred by: [name], MD Reason for referral : No chief complaint on file.   An unsuccessful telephone outreach was attempted today. The patient was referred to the pharmacist for assistance with care management and care coordination.   Follow Up Plan:   Carley Perdue [employer] Scheduler

## 2020-09-06 ENCOUNTER — Encounter: Payer: Self-pay | Admitting: Nurse Practitioner

## 2020-09-06 ENCOUNTER — Other Ambulatory Visit: Payer: Self-pay

## 2020-09-06 ENCOUNTER — Ambulatory Visit (INDEPENDENT_AMBULATORY_CARE_PROVIDER_SITE_OTHER): Payer: Medicare Other | Admitting: Nurse Practitioner

## 2020-09-06 VITALS — BP 146/94 | HR 74 | Temp 98.2°F | Ht 61.0 in | Wt 206.4 lb

## 2020-09-06 DIAGNOSIS — E782 Mixed hyperlipidemia: Secondary | ICD-10-CM

## 2020-09-06 DIAGNOSIS — E1165 Type 2 diabetes mellitus with hyperglycemia: Secondary | ICD-10-CM

## 2020-09-06 DIAGNOSIS — B372 Candidiasis of skin and nail: Secondary | ICD-10-CM

## 2020-09-06 DIAGNOSIS — I10 Essential (primary) hypertension: Secondary | ICD-10-CM | POA: Diagnosis not present

## 2020-09-06 MED ORDER — NYSTATIN 100000 UNIT/GM EX POWD: 1.0000 "application " | Freq: Two times a day (BID) | CUTANEOUS | 0 refills | Status: DC

## 2020-09-06 MED ORDER — RYBELSUS 3 MG PO TABS
3.0000 mg | ORAL_TABLET | Freq: Every day | ORAL | 2 refills | Status: DC
Start: 2020-09-06 — End: 2020-12-17

## 2020-09-06 MED ORDER — NYSTATIN 100000 UNIT/GM EX POWD
1.0000 | Freq: Two times a day (BID) | CUTANEOUS | 0 refills | Status: DC
Start: 2020-09-06 — End: 2020-09-06

## 2020-09-06 NOTE — Progress Notes (Signed)
[facility] [place], [place] [address]  Internal MEDICINE  Office Visit Note  Patient Name: Christina French  [id number]  [id number]  Date of Service: 09/14/2020  Chief Complaint  Patient presents with  . Results    Discuss labs  . Rash    Under both breast x 1 month    HPI  Christina French presents for a follow up visit to discuss labs and for rash under breasts.  Rash under breasts x 2 weeks, treating with monistat cream, itchy area with odor.  -Review labs: low sodium, low chloride, abnormal lipid panel, low vitamin D -A1C 7.2, up from 6.6 in February 2022 TSH and T4 normal      Current Medication: Outpatient Encounter Medications as of 09/06/2020  Medication Sig  . Accu-Chek Softclix Lancets lancets Use as instructed twice a day Dx E11.65  . acetaminophen (TYLENOL) 500 MG tablet Take 1,000 mg by mouth 2 (two) times daily as needed for mild pain or headache.  . ALPRAZolam (XANAX) 1 MG tablet Take 1 tablet (1 mg total) by mouth 3 (three) times daily as needed for anxiety.  Marland Kitchen aspirin EC 81 MG tablet Take 81 mg by mouth daily.  Marland Kitchen atenolol (TENORMIN) 50 MG tablet TAKE 1 TABLET BY MOUTH TWICE A DAY  . Continuous Blood Gluc Sensor (DEXCOM G6 SENSOR) MISC Use every 14 days dx e11.65  . cyclobenzaprine (FLEXERIL) 10 MG tablet Take 1 tablet (10 mg total) by mouth 3 (three) times daily as needed for muscle spasms.  Marland Kitchen escitalopram (LEXAPRO) 20 MG tablet Take 20 mg by mouth daily.   Marland Kitchen gabapentin (NEURONTIN) 100 MG capsule TAKE ONE CAPSULE BY MOUTH TWICE A DAY  . glimepiride (AMARYL) 2 MG tablet Take 1 tablet (2 mg total) by mouth daily before breakfast.  . glucose blood (ACCU-CHEK GUIDE) test strip Use as directed twice a strips twice a day E 11.65  . Lacosamide 150 MG TABS Take 150 mg by mouth 2 (two) times daily.   Marland Kitchen levETIRAcetam (KEPPRA) 500 MG tablet Take 500 mg by mouth 2 (two) times daily.  Marland Kitchen lisinopril (ZESTRIL) 5 MG tablet TAKE 1 TABLET BY MOUTH DAILY  .  metFORMIN (GLUCOPHAGE) 500 MG tablet TAKE 1 TABLET BY MOUTH TWICE A DAY WITH A MEAL  . mirtazapine (REMERON) 15 MG tablet Take 30 mg by mouth at bedtime.   Marland Kitchen omeprazole (PRILOSEC) 40 MG capsule TAKE ONE CAPSULE BY MOUTH TWICE A DAY  . primidone (MYSOLINE) 50 MG tablet Take 50 mg by mouth daily.  . propranolol (INDERAL) 10 MG tablet Take 10 mg by mouth.  . rosuvastatin (CRESTOR) 5 MG tablet TAKE 1 TABLET BY MOUTH DAILY  . Semaglutide (RYBELSUS) 3 MG TABS Take 3 mg by mouth daily before breakfast. With no more than 4 oz of water. Wait 30 minutes before eating, drinking or taking the rest of your medications.  . traZODone (DESYREL) 50 MG tablet Take 50 mg by mouth at bedtime.  . [DISCONTINUED] nystatin (MYCOSTATIN/NYSTOP) powder Apply 1 application topically 2 (two) times daily for 14 days.  . ferrous sulfate 325 (65 FE) MG EC tablet Take 1 tablet (325 mg total) by mouth 2 (two) times daily.  . [DISCONTINUED] nystatin (MYCOSTATIN/NYSTOP) powder Apply 1 application topically 2 (two) times daily for 14 days.   No facility-administered encounter medications on file as of 09/06/2020.    Surgical History: Past Surgical History:  Procedure Laterality Date  . ABDOMINAL HYSTERECTOMY    . CESAREAN SECTION    .  COLONOSCOPY N/A 08/31/2014   Procedure: COLONOSCOPY;  Surgeon: Lucilla Lame, MD;  Location: [place];  Service: Gastroenterology;  Laterality: N/A;  . COLONOSCOPY N/A 08/05/2016   Procedure: COLONOSCOPY;  Surgeon: [name], MD;  Location: [facility] ENDOSCOPY;  Service: Endoscopy;  Laterality: N/A;  . COLONOSCOPY WITH PROPOFOL N/A 12/22/2018   Procedure: COLONOSCOPY WITH PROPOFOL;  Surgeon: Virgel Manifold, MD;  Location: [facility] ENDOSCOPY;  Service: Endoscopy;  Laterality: N/A;  . CYST EXCISION  2011   from throat  . ESOPHAGOGASTRODUODENOSCOPY N/A 08/31/2014   Procedure: ESOPHAGOGASTRODUODENOSCOPY (EGD);  Surgeon: Lucilla Lame, MD;  Location: [place];  Service: Gastroenterology;   Laterality: N/A;  . ESOPHAGOGASTRODUODENOSCOPY N/A 12/21/2018   Procedure: ESOPHAGOGASTRODUODENOSCOPY (EGD);  Surgeon: Virgel Manifold, MD;  Location: [facility] ENDOSCOPY;  Service: Endoscopy;  Laterality: N/A;  . ESOPHAGOGASTRODUODENOSCOPY (EGD) WITH PROPOFOL N/A 07/01/2017   Procedure: ESOPHAGOGASTRODUODENOSCOPY (EGD) WITH PROPOFOL;  Surgeon: [name], MD;  Location: [facility] ENDOSCOPY;  Service: Gastroenterology;  Laterality: N/A;  . FLEXIBLE SIGMOIDOSCOPY N/A 07/01/2017   Procedure: FLEXIBLE SIGMOIDOSCOPY;  Surgeon: [name], MD;  Location: [facility] ENDOSCOPY;  Service: Gastroenterology;  Laterality: N/A;  . GIVENS CAPSULE STUDY  12/22/2018   Procedure: GIVENS CAPSULE STUDY;  Surgeon: Virgel Manifold, MD;  Location: [facility] ENDOSCOPY;  Service: Endoscopy;;  . HEMORRHOID SURGERY    . INCISION AND DRAINAGE HIP Right 11/25/2014   Procedure: IRRIGATION  AND DRAINAGEOF RIGHT HIP WITH PLACEMENT OF ANTIBIOUTIC BEADS;  Surgeon: Mcarthur Rossetti, MD;  Location: [place];  Service: Orthopedics;  Laterality: Right;  . INCISION AND DRAINAGE HIP Right 11/28/2014   Procedure: Repeat I&D Right Hip;  Surgeon: Mcarthur Rossetti, MD;  Location: [place];  Service: Orthopedics;  Laterality: Right;  . JOINT REPLACEMENT Right 2007   hip  . LOOP RECORDER INSERTION N/A 04/04/2019   Procedure: LOOP RECORDER INSERTION;  Surgeon: Isaias Cowman, MD;  Location: [place] CV LAB;  Service: Cardiovascular;  Laterality: N/A;  . TOTAL HIP REVISION Right 03/19/2015   Procedure: RIGHT TOTAL HIP ARTHROPLASTY REVISION;  Surgeon: Meredith Pel, MD;  Location: [place];  Service: Orthopedics;  Laterality: Right;  . TOTAL HIP REVISION Right 11/18/2015   Procedure: TOTAL HIP REVISION;  Surgeon: Frederik Pear, MD;  Location: [place];  Service: Orthopedics;  Laterality: Right;    Medical History: Past Medical History:  Diagnosis Date  . Anemia   . Anxiety   . Arthritis   . Barrett esophagus   . Cancer ([place]) 2016     Skin cancer right leg mylenoma  . Depression   . Diabetes ([place]) 04/25/2017  . GERD (gastroesophageal reflux disease)   . History of blood transfusion    with a surgery  . History of bronchitis   . History of pneumonia   . Hypertension   . IBS (irritable bowel syndrome)   . Pneumonia 09/27/15  . PTSD (post-traumatic stress disorder)   . Renal insufficiency    reports acute kidney injury  . Seizures ([place])    rare - last one 07/30/14 - was very anemic.  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 Vertigo    thinks related to sinus issues    Family History: Family History  Problem Relation Age of Onset  . Alcoholism Father   . Throat cancer Mother   . Breast cancer Maternal Aunt     Social History   Socioeconomic History  . Marital status: Single    Spouse name: Not on file  . Number of children:  Not on file  . Years of education: Not on file  . Highest education level: Not on file  Occupational History  . Not on file  Tobacco Use  . Smoking status: Never Smoker  . Smokeless tobacco: Never Used  Vaping Use  . Vaping Use: Never used  Substance and Sexual Activity  . Alcohol use: No    Alcohol/week: 0.0 standard drinks  . Drug use: No  . Sexual activity: Not Currently  Other Topics Concern  . Not on file  Social History Narrative  . Not on file   Social Determinants of Health   Financial Resource Strain: Not on file  Food Insecurity: Not on file  Transportation Needs: Not on file  Physical Activity: Not on file  Stress: Not on file  Social Connections: Not on file  Intimate Partner Violence: Not on file      Review of Systems  Constitutional: Negative.  Negative for chills, diaphoresis and fatigue.  HENT: Negative for ear pain, postnasal drip and sinus pressure.   Eyes: Negative for photophobia, discharge, redness, itching and visual disturbance.  Respiratory: Negative.  Negative for cough, shortness of breath and wheezing.   Cardiovascular: Negative.  Negative for  chest pain, palpitations and leg swelling.  Gastrointestinal: Negative.  Negative for abdominal pain, constipation, diarrhea, nausea and vomiting.  Genitourinary: Negative.  Negative for dysuria and flank pain.  Musculoskeletal: Negative for arthralgias, back pain, gait problem and neck pain.  Skin: Positive for rash. Negative for color change.       Under breasts   Allergic/Immunologic: Negative for environmental allergies and food allergies.  Neurological: Negative.  Negative for dizziness and headaches.  Hematological: Does not bruise/bleed easily.  Psychiatric/Behavioral: Negative.  Negative for agitation, behavioral problems (depression) and hallucinations.    Vital Signs: BP (!) 146/94   Pulse 74   Temp 98.2 F (36.8 C)   Ht 5\' 1"  (1.549 m)   Wt 206 lb 6.4 oz (93.6 kg)   SpO2 98%   BMI 39.00 kg/m    Physical Exam Vitals reviewed. Exam conducted with a chaperone present.  Constitutional:      Appearance: She is morbidly obese.  HENT:     Head: Normocephalic and atraumatic.  Cardiovascular:     Rate and Rhythm: Normal rate and regular rhythm.     Pulses: Normal pulses.     Heart sounds: Normal heart sounds.  Pulmonary:     Effort: Pulmonary effort is normal.     Breath sounds: Normal breath sounds.  Skin:    General: Skin is warm and dry.     Capillary Refill: Capillary refill takes less than 2 seconds.     Findings: Erythema and rash present. Rash is macular, papular and urticarial.     Comments: Rash under breasts  Neurological:     Mental Status: She is alert and oriented to person, place, and time.  Psychiatric:        Behavior: Behavior is cooperative.        Assessment/Plan: 1. Uncontrolled type 2 diabetes mellitus with hyperglycemia (HCC) Porfiria has diabetes type 2 and it is not optimally controlled. A1C increased from 6.6 in February 2022 to 7.2 tofday. She is currently taking metformin and glimepiride. Rybelsus added to improve blood glucose levels,  A1C and may help Arabel lost some weight which will also help with her glucose levels.  - Semaglutide (RYBELSUS) 3 MG TABS; Take 3 mg by mouth daily before breakfast. With no more than 4 oz  of water. Wait 30 minutes before eating, drinking or taking the rest of your medications.  Dispense: 30 tablet; Refill: 2  2. Candidiasis, intertriginous Bridey has a yeast injection under both breasts. Monistat has not been helping and the area stays moist. Nystain powder prescribed to treat the yeast injection and keep the area dry.  - nystatin (MYCOSTATIN/NYSTOP) powder; Apply 1 application topically 2 (two) times daily for 14 days.  Dispense: 28 g; Refill: 0 Might need Lotrisone on next visit   3. Essential hypertension Her initial blood pressure is elevated, she is on atenolol 50 mg once a day lisinopril 5 mg once a day patient is instructed to monitor at home.  If needed will increase lisinopril to 10 mg once a day she will also need basic metabolic panel  4. Mixed hyperlipidemia Patient is on Crestor 5 mg once a day her recent triglycerides are above 300 however she is not sure if this sample is fasting patient will get a fasting blood draw and then we will add another agent if needed Zetia or Lopid  General Counseling: rashida ladouceur understanding of the findings of todays visit and agrees with plan of treatment. I have discussed any further diagnostic evaluation that may be needed or ordered today. We also reviewed her medications today. she has been encouraged to call the office with any questions or concerns that should arise related to todays visit.    No orders of the defined types were placed in this encounter.   Meds ordered this encounter  Medications  . DISCONTD: nystatin (MYCOSTATIN/NYSTOP) powder    Sig: Apply 1 application topically 2 (two) times daily for 14 days.    Dispense:  28 g    Refill:  0  . DISCONTD: nystatin (MYCOSTATIN/NYSTOP) powder    Sig: Apply 1 application topically 2  (two) times daily for 14 days.    Dispense:  28 g    Refill:  0  . Semaglutide (RYBELSUS) 3 MG TABS    Sig: Take 3 mg by mouth daily before breakfast. With no more than 4 oz of water. Wait 30 minutes before eating, drinking or taking the rest of your medications.    Dispense:  30 tablet    Refill:  2   Return in about 3 months (around 12/07/2020) for F/U, Recheck A1C, Ilai Hiller PCP.  Total time spent:18 Minutes Time spent includes review of chart, medications, test results, and follow up plan with the patient.   [place] Controlled Substance Database was reviewed by me.   Dr [name] Internal medicine

## 2020-09-10 ENCOUNTER — Other Ambulatory Visit: Payer: Self-pay

## 2020-09-10 ENCOUNTER — Encounter: Payer: Self-pay | Admitting: Nurse Practitioner

## 2020-09-10 DIAGNOSIS — B372 Candidiasis of skin and nail: Secondary | ICD-10-CM

## 2020-09-10 MED ORDER — NYSTATIN 100000 UNIT/GM EX POWD: 1.0000 "application " | Freq: Two times a day (BID) | CUTANEOUS | 0 refills | Status: AC

## 2020-09-18 ENCOUNTER — Telehealth: Payer: Self-pay | Admitting: Internal Medicine

## 2020-09-18 NOTE — Chronic Care Management (AMB) (Signed)
  Chronic Care Management   Outreach Note  09/18/2020 Name: Christina French MRN: [id number] DOB: [birth year]  Referred by: [name], MD Reason for referral : No chief complaint on file.   Third unsuccessful telephone outreach was attempted today. The patient was referred to the pharmacist for assistance with care management and care coordination.   Follow Up Plan:   [name] [employer] scheduler

## 2020-09-27 ENCOUNTER — Telehealth: Payer: Self-pay | Admitting: Internal Medicine

## 2020-09-27 NOTE — Chronic Care Management (AMB) (Signed)
  Chronic Care Management   Outreach Note  09/27/2020 Name: Christina French MRN: [id number] DOB: 01/04/1956  Referred by: [name], MD Reason for referral : No chief complaint on file.   Third unsuccessful telephone outreach was attempted today. The patient was referred to the pharmacist for assistance with care management and care coordination.   Follow Up Plan:   [name] [employer] Scheduler

## 2020-10-11 ENCOUNTER — Observation Stay
Admission: EM | Admit: 2020-10-11 | Discharge: 2020-10-17 | Disposition: A | Discharge status: Home / Self Care | Payer: Medicare Other | Attending: Internal Medicine | Admitting: Internal Medicine

## 2020-10-11 ENCOUNTER — Other Ambulatory Visit: Payer: Self-pay

## 2020-10-11 ENCOUNTER — Encounter: Payer: Self-pay | Admitting: Emergency Medicine

## 2020-10-11 ENCOUNTER — Emergency Department: Payer: Medicare Other

## 2020-10-11 DIAGNOSIS — K7460 Unspecified cirrhosis of liver: Secondary | ICD-10-CM | POA: Diagnosis not present

## 2020-10-11 DIAGNOSIS — Z79899 Other long term (current) drug therapy: Secondary | ICD-10-CM | POA: Diagnosis not present

## 2020-10-11 DIAGNOSIS — G40909 Epilepsy, unspecified, not intractable, without status epilepticus: Secondary | ICD-10-CM

## 2020-10-11 DIAGNOSIS — E871 Hypo-osmolality and hyponatremia: Secondary | ICD-10-CM | POA: Insufficient documentation

## 2020-10-11 DIAGNOSIS — R296 Repeated falls: Secondary | ICD-10-CM | POA: Diagnosis not present

## 2020-10-11 DIAGNOSIS — Z743 Need for continuous supervision: Secondary | ICD-10-CM | POA: Diagnosis not present

## 2020-10-11 DIAGNOSIS — W19XXXA Unspecified fall, initial encounter: Secondary | ICD-10-CM | POA: Diagnosis not present

## 2020-10-11 DIAGNOSIS — M25519 Pain in unspecified shoulder: Secondary | ICD-10-CM

## 2020-10-11 DIAGNOSIS — R0902 Hypoxemia: Secondary | ICD-10-CM | POA: Diagnosis not present

## 2020-10-11 DIAGNOSIS — R404 Transient alteration of awareness: Secondary | ICD-10-CM | POA: Diagnosis not present

## 2020-10-11 DIAGNOSIS — F411 Generalized anxiety disorder: Secondary | ICD-10-CM

## 2020-10-11 DIAGNOSIS — Z96641 Presence of right artificial hip joint: Secondary | ICD-10-CM | POA: Diagnosis not present

## 2020-10-11 DIAGNOSIS — Z23 Encounter for immunization: Secondary | ICD-10-CM | POA: Insufficient documentation

## 2020-10-11 DIAGNOSIS — R29818 Other symptoms and signs involving the nervous system: Secondary | ICD-10-CM | POA: Diagnosis not present

## 2020-10-11 DIAGNOSIS — Z7982 Long term (current) use of aspirin: Secondary | ICD-10-CM | POA: Insufficient documentation

## 2020-10-11 DIAGNOSIS — E119 Type 2 diabetes mellitus without complications: Secondary | ICD-10-CM | POA: Insufficient documentation

## 2020-10-11 DIAGNOSIS — Z7984 Long term (current) use of oral hypoglycemic drugs: Secondary | ICD-10-CM | POA: Insufficient documentation

## 2020-10-11 DIAGNOSIS — R1032 Left lower quadrant pain: Secondary | ICD-10-CM | POA: Diagnosis not present

## 2020-10-11 DIAGNOSIS — Z20822 Contact with and (suspected) exposure to covid-19: Secondary | ICD-10-CM | POA: Diagnosis not present

## 2020-10-11 DIAGNOSIS — E1165 Type 2 diabetes mellitus with hyperglycemia: Secondary | ICD-10-CM

## 2020-10-11 DIAGNOSIS — R42 Dizziness and giddiness: Secondary | ICD-10-CM

## 2020-10-11 DIAGNOSIS — I10 Essential (primary) hypertension: Secondary | ICD-10-CM | POA: Insufficient documentation

## 2020-10-11 DIAGNOSIS — I4891 Unspecified atrial fibrillation: Secondary | ICD-10-CM

## 2020-10-11 DIAGNOSIS — H8112 Benign paroxysmal vertigo, left ear: Principal | ICD-10-CM | POA: Insufficient documentation

## 2020-10-11 DIAGNOSIS — K449 Diaphragmatic hernia without obstruction or gangrene: Secondary | ICD-10-CM | POA: Diagnosis not present

## 2020-10-11 DIAGNOSIS — M79601 Pain in right arm: Secondary | ICD-10-CM | POA: Diagnosis not present

## 2020-10-11 DIAGNOSIS — Z85828 Personal history of other malignant neoplasm of skin: Secondary | ICD-10-CM | POA: Insufficient documentation

## 2020-10-11 DIAGNOSIS — K5730 Diverticulosis of large intestine without perforation or abscess without bleeding: Secondary | ICD-10-CM | POA: Diagnosis not present

## 2020-10-11 LAB — URINALYSIS, COMPLETE (UACMP) WITH MICROSCOPIC
Bilirubin Urine: NEGATIVE
Glucose, UA: NEGATIVE mg/dL
Hgb urine dipstick: NEGATIVE
Ketones, ur: 5 mg/dL — AB
Leukocytes,Ua: NEGATIVE
Nitrite: NEGATIVE
Protein, ur: 30 mg/dL — AB
Specific Gravity, Urine: 1.017 (ref 1.005–1.030)
pH: 5 (ref 5.0–8.0)

## 2020-10-11 LAB — BASIC METABOLIC PANEL
Anion gap: 11 (ref 5–15)
BUN: 15 mg/dL (ref 8–23)
CO2: 24 mmol/L (ref 22–32)
Calcium: 8.8 mg/dL — ABNORMAL LOW (ref 8.9–10.3)
Chloride: 92 mmol/L — ABNORMAL LOW (ref 98–111)
Creatinine, Ser: 1.01 mg/dL — ABNORMAL HIGH (ref 0.44–1.00)
GFR, Estimated: >60 mL/min (ref >60)
Glucose, Bld: 171 mg/dL — ABNORMAL HIGH (ref 70–99)
Potassium: 3.7 mmol/L (ref 3.5–5.1)
Sodium: 127 mmol/L — ABNORMAL LOW (ref 135–145)

## 2020-10-11 LAB — CBC
HCT: 35.1 % — ABNORMAL LOW (ref 36.0–46.0)
Hemoglobin: 12.5 g/dL (ref 12.0–15.0)
MCH: 27.4 pg (ref 26.0–34.0)
MCHC: 35.6 g/dL (ref 30.0–36.0)
MCV: 76.8 fL — ABNORMAL LOW (ref 80.0–100.0)
Platelets: 170 10*3/uL (ref 150–400)
RBC: 4.57 MIL/uL (ref 3.87–5.11)
RDW: 13.1 % (ref 11.5–15.5)
WBC: 15.1 10*3/uL — ABNORMAL HIGH (ref 4.0–10.5)
nRBC: 0 % (ref 0.0–0.2)

## 2020-10-11 MED ORDER — MECLIZINE HCL 25 MG PO TABS
50.0000 mg | ORAL_TABLET | Freq: Once | ORAL | Status: AC
  Administered 2020-10-11: 50 mg via ORAL
  Filled 2020-10-11: qty 2

## 2020-10-11 MED ORDER — SODIUM CHLORIDE 0.9 % IV BOLUS
1000.0000 mL | Freq: Once | INTRAVENOUS | Status: AC
  Administered 2020-10-11: 1000 mL via INTRAVENOUS

## 2020-10-11 MED ORDER — ONDANSETRON HCL 4 MG/2ML IJ SOLN
4.0000 mg | Freq: Once | INTRAMUSCULAR | Status: AC
  Administered 2020-10-11: 4 mg via INTRAVENOUS
  Filled 2020-10-11: qty 2

## 2020-10-11 NOTE — ED Notes (Signed)
See triage note. Pt reports dizziness that started today causing 3 falls today. Denies hitting head or LOC.  States she thinks it is vertigo or d/t new meds for "nerves" Denies hx of vertigo Alert and oriented. NAD noted. Clear speech

## 2020-10-11 NOTE — ED Notes (Signed)
Pt placed on purewick 

## 2020-10-11 NOTE — ED Provider Notes (Signed)
[facility] Emergency Department Provider Note  ____________________________________________  Time seen: Approximately 6:20 PM  I have reviewed the triage vital signs and the nursing notes.   HISTORY  Chief Complaint Fall and Dizziness    HPI Christina French is a 65 y.o. female with a past history of diabetes hypertension prior stroke and vertigo who comes ED complaining of dizziness described as room spinning that she first noticed when waking up at 4:30 AM today.  It has been constant.  This caused her to fall 3 times, and she was unable to get herself back up.  After her third time calling EMS to help her up off the floor they brought her to the emergency department.  She denies headaches vision changes paresthesias or weakness.  No chest pain shortness of breath or other pain complaints.  Denies vomiting.  Does not take blood thinners.  She endorses a history of vertigo, but denies having symptoms like this before.    Past Medical History:  Diagnosis Date   Anemia    Anxiety    Arthritis    Barrett esophagus    Cancer ([place]) 2016    Skin cancer right leg mylenoma   Depression    Diabetes ([place]) 04/25/2017   GERD (gastroesophageal reflux disease)    History of blood transfusion    with a surgery   History of bronchitis    History of pneumonia    Hypertension    IBS (irritable bowel syndrome)    Pneumonia 09/27/15   PTSD (post-traumatic stress disorder)    Renal insufficiency    reports acute kidney injury   Seizures ([place])    rare - last one 07/30/14 - was very anemic.  Silent seziure -June 2016-    Vertigo    thinks related to sinus issues     Patient Active Problem List   Diagnosis Date Noted   Atrial fibrillation, unspecified type ([place]) 10/11/2020   Hyperlipidemia associated with type 2 diabetes mellitus ([place]) 02/02/2020   Uncontrolled type 2 diabetes mellitus with hyperglycemia ([place]) 01/31/2020   Encounter for general adult medical examination  with abnormal findings 06/16/2019   Neuralgia and neuritis, unspecified 06/16/2019   Central sleep apnea due to medical condition 02/27/2019   Hematochezia 12/20/2018   Impaired fasting glucose 11/11/2017   Low back pain of thoracolumbar region with sciatica 11/11/2017   Severe anemia    GI bleed 04/25/2017   Symptomatic anemia 04/25/2017   Hyponatremia 04/25/2017   Diabetes ([place]) 04/25/2017   Rectal bleeding 08/03/2016   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) 08/17/2013     Past Surgical History:  Procedure Laterality Date   ABDOMINAL HYSTERECTOMY     CESAREAN SECTION     COLONOSCOPY N/A 08/31/2014   Procedure: COLONOSCOPY;  Surgeon: Lucilla Lame, MD;  Location: [place];  Service: Gastroenterology;  Laterality: N/A;   COLONOSCOPY N/A 08/05/2016  Procedure: COLONOSCOPY;  Surgeon: [name], MD;  Location: [facility] ENDOSCOPY;  Service: Endoscopy;  Laterality: N/A;   COLONOSCOPY WITH PROPOFOL N/A 12/22/2018   Procedure: COLONOSCOPY WITH PROPOFOL;  Surgeon: Virgel Manifold, MD;  Location: [facility] ENDOSCOPY;  Service: Endoscopy;  Laterality: N/A;   CYST EXCISION  2011   from throat   ESOPHAGOGASTRODUODENOSCOPY N/A 08/31/2014   Procedure: ESOPHAGOGASTRODUODENOSCOPY (EGD);  Surgeon: Lucilla Lame, MD;  Location: [place];  Service: Gastroenterology;  Laterality: N/A;   ESOPHAGOGASTRODUODENOSCOPY N/A  12/21/2018   Procedure: ESOPHAGOGASTRODUODENOSCOPY (EGD);  Surgeon: Virgel Manifold, MD;  Location: [facility] ENDOSCOPY;  Service: Endoscopy;  Laterality: N/A;   ESOPHAGOGASTRODUODENOSCOPY (EGD) WITH PROPOFOL N/A 07/01/2017   Procedure: ESOPHAGOGASTRODUODENOSCOPY (EGD) WITH PROPOFOL;  Surgeon: [name], MD;  Location: [facility] ENDOSCOPY;  Service: Gastroenterology;  Laterality: N/A;   FLEXIBLE SIGMOIDOSCOPY N/A 07/01/2017   Procedure: FLEXIBLE SIGMOIDOSCOPY;  Surgeon: [name], MD;  Location: [facility] ENDOSCOPY;  Service: Gastroenterology;  Laterality: N/A;   GIVENS CAPSULE STUDY  12/22/2018   Procedure: GIVENS CAPSULE STUDY;  Surgeon: Virgel Manifold, MD;  Location: [facility] ENDOSCOPY;  Service: Endoscopy;;   HEMORRHOID SURGERY     INCISION AND DRAINAGE HIP Right 11/25/2014   Procedure: IRRIGATION  AND DRAINAGEOF RIGHT HIP WITH PLACEMENT OF ANTIBIOUTIC BEADS;  Surgeon: Mcarthur Rossetti, MD;  Location: [place];  Service: Orthopedics;  Laterality: Right;   INCISION AND DRAINAGE HIP Right 11/28/2014   Procedure: Repeat I&D Right Hip;  Surgeon: Mcarthur Rossetti, MD;  Location: [place];  Service: Orthopedics;  Laterality: Right;   JOINT REPLACEMENT Right 2007   hip   LOOP RECORDER INSERTION N/A 04/04/2019   Procedure: LOOP RECORDER INSERTION;  Surgeon: Isaias Cowman, MD;  Location: [place] CV LAB;  Service: Cardiovascular;  Laterality: N/A;   TOTAL HIP REVISION Right 03/19/2015   Procedure: RIGHT TOTAL HIP ARTHROPLASTY REVISION;  Surgeon: Meredith Pel, MD;  Location: [place];  Service: Orthopedics;  Laterality: Right;   TOTAL HIP REVISION Right 11/18/2015   Procedure: TOTAL HIP REVISION;  Surgeon: Frederik Pear, MD;  Location: [place];  Service: Orthopedics;  Laterality: Right;     Prior to Admission medications   Medication Sig Start Date End Date Taking? Authorizing Provider  Accu-Chek Softclix Lancets lancets Use as instructed twice a day Dx E11.65 01/11/20   Ronnell Freshwater, NP   acetaminophen (TYLENOL) 500 MG tablet Take 1,000 mg by mouth 2 (two) times daily as needed for mild pain or headache.    [provider]  ALPRAZolam Duanne Moron) 1 MG tablet Take 1 tablet (1 mg total) by mouth 3 (three) times daily as needed for anxiety. 10/19/17   Ronnell Freshwater, NP  aspirin EC 81 MG tablet Take 81 mg by mouth daily.    [provider]  atenolol (TENORMIN) 50 MG tablet TAKE 1 TABLET BY MOUTH TWICE A DAY 09/03/20   [name], MD  Continuous Blood Gluc Sensor (DEXCOM G6 SENSOR) MISC Use every 14 days dx e11.65 05/31/20   Luiz Ochoa, NP  cyclobenzaprine (FLEXERIL) 10 MG tablet Take 1 tablet (10 mg total) by mouth 3 (three) times daily as needed for muscle spasms. 08/27/20   Luiz Ochoa, NP  escitalopram (LEXAPRO) 20 MG tablet Take 20 mg by mouth daily.     [provider]  ferrous sulfate 325 (65 FE) MG EC tablet Take 1 tablet (325 mg total) by mouth 2 (two) times daily. 12/23/18 04/04/19  Saundra Shelling, MD  gabapentin (  NEURONTIN) 100 MG capsule TAKE ONE CAPSULE BY MOUTH TWICE A DAY 09/03/20   [name], MD  glimepiride (AMARYL) 2 MG tablet Take 1 tablet (2 mg total) by mouth daily before breakfast. 08/27/20   Luiz Ochoa, NP  glucose blood (ACCU-CHEK GUIDE) test strip Use as directed twice a strips twice a day E 11.65 01/11/20   Ronnell Freshwater, NP  Lacosamide 150 MG TABS Take 150 mg by mouth 2 (two) times daily.     [provider]  levETIRAcetam (KEPPRA) 500 MG tablet Take 500 mg by mouth 2 (two) times daily.    [provider]  lisinopril (ZESTRIL) 5 MG tablet TAKE 1 TABLET BY MOUTH DAILY 07/30/20   [name], MD  metFORMIN (GLUCOPHAGE) 500 MG tablet TAKE 1 TABLET BY MOUTH TWICE A DAY WITH A MEAL 09/03/20   [name], MD  mirtazapine (REMERON) 15 MG tablet Take 30 mg by mouth at bedtime.     [provider]  omeprazole (PRILOSEC) 40 MG capsule TAKE ONE CAPSULE BY MOUTH TWICE A DAY 08/08/20   [name], MD   primidone (MYSOLINE) 50 MG tablet Take 50 mg by mouth daily. 03/28/20 03/28/21  [provider]  propranolol (INDERAL) 10 MG tablet Take 10 mg by mouth. 03/28/20 03/28/21  [provider]  rosuvastatin (CRESTOR) 5 MG tablet TAKE 1 TABLET BY MOUTH DAILY 07/30/20   [name], MD  Semaglutide (RYBELSUS) 3 MG TABS Take 3 mg by mouth daily before breakfast. With no more than 4 oz of water. Wait 30 minutes before eating, drinking or taking the rest of your medications. 09/06/20   Jonetta Osgood, NP  traZODone (DESYREL) 50 MG tablet Take 50 mg by mouth at bedtime.    [provider]     Allergies Patient has no known allergies.   Family History  Problem Relation Age of Onset   Alcoholism Father    Throat cancer Mother    Breast cancer Maternal Aunt     Social History Social History   Tobacco Use   Smoking status: Never   Smokeless tobacco: Never  Vaping Use   Vaping Use: Never used  Substance Use Topics   Alcohol use: No    Alcohol/week: 0.0 standard drinks   Drug use: No    Review of Systems  Constitutional:   No fever or chills.  ENT:   No sore throat. No rhinorrhea. Cardiovascular:   No chest pain or syncope. Respiratory:   No dyspnea or cough. Gastrointestinal:   Negative for abdominal pain, vomiting and diarrhea.  Musculoskeletal:   Negative for focal pain or swelling All other systems reviewed and are negative except as documented above in ROS and HPI.  ____________________________________________   PHYSICAL EXAM:  VITAL SIGNS: ED Triage Vitals  Enc Vitals Group     BP 10/11/20 1324 107/84     Pulse Rate 10/11/20 1324 (!) 101     Resp 10/11/20 1324 20     Temp 10/11/20 1324 98 F (36.7 C)     Temp Source 10/11/20 1324 Oral     SpO2 10/11/20 1324 98 %     Weight 10/11/20 1325 206 lb 5.6 oz (93.6 kg)     Height 10/11/20 1325 5\' 1"  (1.549 m)     Head Circumference --      Peak Flow --      Pain Score 10/11/20 1325 0     Pain Loc  --  Pain Edu? --      Excl. in [place]? --     Vital signs reviewed, nursing assessments reviewed.   Constitutional:   Alert and oriented. Non-toxic appearance. Eyes:   Conjunctivae are normal. EOMI. PERRL.  No nystagmus ENT      Head:   Normocephalic and atraumatic.      Nose:   Wearing a mask.      Mouth/Throat:   Wearing a mask.      Neck:   No meningismus. Full ROM. Hematological/Lymphatic/Immunilogical:   No cervical lymphadenopathy. Cardiovascular:   RRR. Symmetric bilateral radial and DP pulses.  No murmurs. Cap refill less than 2 seconds. Respiratory:   Normal respiratory effort without tachypnea/retractions. Breath sounds are clear and equal bilaterally. No wheezes/rales/rhonchi. Gastrointestinal:   Soft with mild left lower quadrant tenderness. Non distended. There is no CVA tenderness.  No rebound, rigidity, or guarding. Genitourinary:   deferred Musculoskeletal:   Normal range of motion in all extremities. No joint effusions.  No lower extremity tenderness.  No edema. Neurologic:   Normal speech and language.  Cranial nerves II through XII intact Motor grossly intact. No pronator drift.  Normal finger-to-nose, cerebellar function intact No acute focal neurologic deficits are appreciated.  Skin:    Skin is warm, dry and intact. No rash noted.  No petechiae, purpura, or bullae.  ____________________________________________    LABS (pertinent positives/negatives) (all labs ordered are listed, but only abnormal results are displayed) Labs Reviewed  BASIC METABOLIC PANEL - Abnormal; Notable for the following components:      Result Value   Sodium 127 (*)    Chloride 92 (*)    Glucose, Bld 171 (*)    Creatinine, Ser 1.01 (*)    Calcium 8.8 (*)    All other components within normal limits  CBC - Abnormal; Notable for the following components:   WBC 15.1 (*)    HCT 35.1 (*)    MCV 76.8 (*)    All other components within normal limits  URINALYSIS, COMPLETE (UACMP)  WITH MICROSCOPIC - Abnormal; Notable for the following components:   Color, Urine YELLOW (*)    APPearance CLOUDY (*)    Ketones, ur 5 (*)    Protein, ur 30 (*)    Bacteria, UA RARE (*)    All other components within normal limits  RESP PANEL BY RT-PCR (FLU A&B, COVID) ARPGX2  CBG MONITORING, ED   ____________________________________________   EKG  Interpreted by me Sinus rhythm rate of 100.  Normal axis, first-degree AV block.  Normal QRS and ST segments.  Isolated T wave inversion in lead III which is nonspecific.  ____________________________________________    RADIOLOGY  CT ABDOMEN PELVIS WO CONTRAST  Result Date: 10/11/2020 CLINICAL DATA:  Diverticulitis suspected Patient reports dizziness leading to fall.  Multiple falls today. EXAM: CT ABDOMEN AND PELVIS WITHOUT CONTRAST TECHNIQUE: Multidetector CT imaging of the abdomen and pelvis was performed following the standard protocol without IV contrast. COMPARISON:  Remote CT 12/04/2010 FINDINGS: Lower chest: Subsegmental atelectasis in the lung bases. Heart is normal in size. Hepatobiliary: Diffusely decreased hepatic density typical of steatosis. There is no evidence of focal lesion. Question of micro nodular hepatic contours. Small calcified gallstone versus wall calcification in the gallbladder neck. No pericholecystic inflammation. There is no biliary dilatation. Pancreas: Fatty atrophy.  No ductal dilatation or inflammation. Spleen: Mild splenomegaly with spleen measuring 14.0 x 7.4 x 8.9 cm (volume = 480 cm^3). No focal abnormality. No perisplenic fluid. Adrenals/Urinary Tract:  Normal adrenal glands. There is no hydronephrosis or perinephric edema. Slight lobulated renal contours. No renal calculi. No evidence of focal renal lesion. Decompressed ureters. Partially distended urinary bladder. Stomach/Bowel: Small hiatal hernia. Stomach otherwise physiologically distended. Decompressed small bowel without obstruction or inflammation.  Appendix is not definitively visualized, no appendicitis. There is moderate volume of colonic stool. Mild diverticulosis without diverticulitis. Chain sutures noted in the distal sigmoid/rectum. There is a 2.6 cm segment of nondistended proximal sigmoid colon, that is surrounded by a stool, unclear if this represents a focal colonic narrowing or area of peristalsis. No associated fat stranding. Vascular/Lymphatic: Mild aortic atherosclerosis. No aortic aneurysm. No bulky abdominopelvic adenopathy. Reproductive: Hysterectomy without adnexal mass. Other: No ascites, free air, or focal fluid collection. No body wall hernia. Musculoskeletal: Right hip replacement partially visualized. Mild scoliosis and degenerative change in the spine. There are no acute or suspicious osseous abnormalities. IMPRESSION: 1. Colonic diverticulosis without diverticulitis. There is a 2.6 cm segment of nondistended proximal sigmoid colon, that is surrounded by a stool. It is unclear if this represents a focal colonic narrowing or area of peristalsis. Recommend correlation with colonoscopy. 2. Hepatic steatosis. Question of micro nodular hepatic contours, can be seen with cirrhosis. Recommend correlation with cirrhosis risk factors. Mild splenomegaly. 3. Gallstone versus wall calcification in the gallbladder neck without CT findings of acute cholecystitis. 4. Small hiatal hernia. Aortic Atherosclerosis (ICD10-I70.0). Electronically Signed   By: Keith Rake M.D.   On: 10/11/2020 17:29   CT Head Wo Contrast  Result Date: 10/11/2020 CLINICAL DATA:  Acute neuro deficit.  Fall, dizziness EXAM: CT HEAD WITHOUT CONTRAST TECHNIQUE: Contiguous axial images were obtained from the base of the skull through the vertex without intravenous contrast. COMPARISON:  MRI head 01/20/2019 FINDINGS: Brain: No evidence of acute infarction, hemorrhage, hydrocephalus, extra-axial collection or mass lesion/mass effect. Vascular: Negative for hyperdense vessel  Skull: Negative Sinuses/Orbits: Air-fluid level sphenoid sinus. Remaining sinuses clear. Negative orbit Other: None IMPRESSION: No acute abnormality Air-fluid level sphenoid sinus. Electronically Signed   By: Franchot Gallo M.D.   On: 10/11/2020 17:16    ____________________________________________   PROCEDURES Procedures  ____________________________________________  DIFFERENTIAL DIAGNOSIS   Intracranial hemorrhage, stroke, dehydration, UTI, electrolyte abnormality  CLINICAL IMPRESSION / ASSESSMENT AND PLAN / ED COURSE  Medications ordered in the ED: Medications  sodium chloride 0.9 % bolus 1,000 mL (0 mLs Intravenous Stopped 10/11/20 2205)  ondansetron (ZOFRAN) injection 4 mg (4 mg Intravenous Given 10/11/20 1701)  meclizine (ANTIVERT) tablet 50 mg (50 mg Oral Given 10/11/20 1700)    Pertinent labs & imaging results that were available during my care of the patient were reviewed by me and considered in my medical decision making (see chart for details).  Christina French was evaluated in Emergency Department on 10/11/2020 for the symptoms described in the history of present illness. She was evaluated in the context of the global COVID-19 pandemic, which necessitated consideration that the patient might be at risk for infection with the SARS-CoV-2 virus that causes COVID-19. Institutional protocols and algorithms that pertain to the evaluation of patients at risk for COVID-19 are in a state of rapid change based on information released by regulatory bodies including the CDC and federal and state organizations. These policies and algorithms were followed during the patient's care in the ED.   Patient presents with vertiginous symptoms.  Labs show slightly low sodium level which appears to be approximately her chronic baseline.  No other significant findings on lab work-up.  Urinalysis normal.  CT head unremarkable negative for intracranial hemorrhage.  CT abdomen pelvis unremarkable.  Will obtain  MRI brain.  Clinical Course as of 10/11/20 2355  Fri Oct 11, 2020  2353 Patient not feeling any better, still very vertiginous.  She lives alone, had 3 falls today requiring calling EMS to help her up.  MRI read is still pending.  We will need to hospitalize for further management until symptoms improve [PS]    Clinical Course User Index [PS] Carrie Mew, MD     ____________________________________________   FINAL CLINICAL IMPRESSION(S) / ED DIAGNOSES    Final diagnoses:  Dizziness  Multiple falls  Hyponatremia  Atrial fibrillation, unspecified type ([place])  Seizure disorder ([place])  Type 2 diabetes mellitus without complication, without long-term current use of insulin [facility])     ED Discharge Orders     None       Portions of this note were generated with dragon dictation software. Dictation errors may occur despite best attempts at proofreading.   Carrie Mew, MD 10/11/20 2355

## 2020-10-11 NOTE — ED Notes (Signed)
Responded to family member requesting water and food for her mother. Dr Joni Fears notified and cleared patient to eat and drink. Pt given [place] sandwich tray and water.

## 2020-10-11 NOTE — ED Notes (Signed)
Patient transported to MRI 

## 2020-10-11 NOTE — ED Triage Notes (Signed)
Pt brought in by [facility] with c/o fall and dizziness. Pt has never had this happen before. She states that she has been dizzy all day and it is constant because of this she has fallen three times. Denies any injuries, LOC or being on blood thinners

## 2020-10-12 ENCOUNTER — Observation Stay: Payer: Medicare Other

## 2020-10-12 ENCOUNTER — Encounter: Payer: Self-pay | Admitting: Family Medicine

## 2020-10-12 DIAGNOSIS — I10 Essential (primary) hypertension: Secondary | ICD-10-CM

## 2020-10-12 DIAGNOSIS — M7989 Other specified soft tissue disorders: Secondary | ICD-10-CM | POA: Diagnosis not present

## 2020-10-12 DIAGNOSIS — F418 Other specified anxiety disorders: Secondary | ICD-10-CM | POA: Diagnosis not present

## 2020-10-12 DIAGNOSIS — E1165 Type 2 diabetes mellitus with hyperglycemia: Secondary | ICD-10-CM | POA: Diagnosis not present

## 2020-10-12 DIAGNOSIS — E119 Type 2 diabetes mellitus without complications: Secondary | ICD-10-CM | POA: Diagnosis not present

## 2020-10-12 DIAGNOSIS — M25511 Pain in right shoulder: Secondary | ICD-10-CM | POA: Diagnosis not present

## 2020-10-12 DIAGNOSIS — R42 Dizziness and giddiness: Secondary | ICD-10-CM | POA: Diagnosis not present

## 2020-10-12 DIAGNOSIS — G40909 Epilepsy, unspecified, not intractable, without status epilepticus: Secondary | ICD-10-CM

## 2020-10-12 DIAGNOSIS — M25521 Pain in right elbow: Secondary | ICD-10-CM | POA: Diagnosis not present

## 2020-10-12 DIAGNOSIS — I4891 Unspecified atrial fibrillation: Secondary | ICD-10-CM | POA: Diagnosis not present

## 2020-10-12 DIAGNOSIS — H8112 Benign paroxysmal vertigo, left ear: Secondary | ICD-10-CM | POA: Diagnosis not present

## 2020-10-12 DIAGNOSIS — M25531 Pain in right wrist: Secondary | ICD-10-CM | POA: Diagnosis not present

## 2020-10-12 DIAGNOSIS — E871 Hypo-osmolality and hyponatremia: Secondary | ICD-10-CM | POA: Diagnosis not present

## 2020-10-12 LAB — BASIC METABOLIC PANEL
Anion gap: 7 (ref 5–15)
Anion gap: 5 (ref 5–15)
BUN: 12 mg/dL (ref 8–23)
BUN: 9 mg/dL (ref 8–23)
CO2: 28 mmol/L (ref 22–32)
CO2: 29 mmol/L (ref 22–32)
Calcium: 8.4 mg/dL — ABNORMAL LOW (ref 8.9–10.3)
Calcium: 8.4 mg/dL — ABNORMAL LOW (ref 8.9–10.3)
Chloride: 94 mmol/L — ABNORMAL LOW (ref 98–111)
Chloride: 99 mmol/L (ref 98–111)
Creatinine, Ser: 0.82 mg/dL (ref 0.44–1.00)
Creatinine, Ser: 0.8 mg/dL (ref 0.44–1.00)
GFR, Estimated: >60 mL/min (ref >60)
GFR, Estimated: >60 mL/min (ref >60)
Glucose, Bld: 162 mg/dL — ABNORMAL HIGH (ref 70–99)
Glucose, Bld: 123 mg/dL — ABNORMAL HIGH (ref 70–99)
Potassium: 3.2 mmol/L — ABNORMAL LOW (ref 3.5–5.1)
Potassium: 3.9 mmol/L (ref 3.5–5.1)
Sodium: 129 mmol/L — ABNORMAL LOW (ref 135–145)
Sodium: 133 mmol/L — ABNORMAL LOW (ref 135–145)

## 2020-10-12 LAB — CBC
HCT: 33.7 % — ABNORMAL LOW (ref 36.0–46.0)
Hemoglobin: 11.4 g/dL — ABNORMAL LOW (ref 12.0–15.0)
MCH: 26.8 pg (ref 26.0–34.0)
MCHC: 33.8 g/dL (ref 30.0–36.0)
MCV: 79.1 fL — ABNORMAL LOW (ref 80.0–100.0)
Platelets: 123 10*3/uL — ABNORMAL LOW (ref 150–400)
RBC: 4.26 MIL/uL (ref 3.87–5.11)
RDW: 13.3 % (ref 11.5–15.5)
WBC: 7.3 10*3/uL (ref 4.0–10.5)
nRBC: 0 % (ref 0.0–0.2)

## 2020-10-12 LAB — CBG MONITORING, ED
Glucose-Capillary: 124 mg/dL — ABNORMAL HIGH (ref 70–99)
Glucose-Capillary: 188 mg/dL — ABNORMAL HIGH (ref 70–99)
Glucose-Capillary: 136 mg/dL — ABNORMAL HIGH (ref 70–99)

## 2020-10-12 LAB — RESP PANEL BY RT-PCR (FLU A&B, COVID) ARPGX2
Influenza A by PCR: NEGATIVE
Influenza B by PCR: NEGATIVE
SARS Coronavirus 2 by RT PCR: NEGATIVE

## 2020-10-12 LAB — HIV ANTIBODY (ROUTINE TESTING W REFLEX): HIV Screen 4th Generation wRfx: NONREACTIVE

## 2020-10-12 LAB — MAGNESIUM: Magnesium: 1.7 mg/dL (ref 1.7–2.4)

## 2020-10-12 LAB — GLUCOSE, CAPILLARY: Glucose-Capillary: 102 mg/dL — ABNORMAL HIGH (ref 70–99)

## 2020-10-12 MED ORDER — PROPRANOLOL HCL 20 MG PO TABS: 10.0000 mg | ORAL_TABLET | Freq: Two times a day (BID) | ORAL | Status: DC

## 2020-10-12 MED ORDER — GABAPENTIN 100 MG PO CAPS
100.0000 mg | ORAL_CAPSULE | Freq: Two times a day (BID) | ORAL | Status: DC
  Administered 2020-10-12 – 2020-10-17 (×11): 100 mg via ORAL
  Filled 2020-10-12 (×11): qty 1

## 2020-10-12 MED ORDER — PANTOPRAZOLE SODIUM 40 MG PO TBEC
40.0000 mg | DELAYED_RELEASE_TABLET | Freq: Every day | ORAL | Status: DC
  Administered 2020-10-12 – 2020-10-17 (×6): 40 mg via ORAL
  Filled 2020-10-12 (×6): qty 1

## 2020-10-12 MED ORDER — ONDANSETRON HCL 4 MG/2ML IJ SOLN: 4.0000 mg | Freq: Four times a day (QID) | INTRAMUSCULAR | Status: DC | PRN

## 2020-10-12 MED ORDER — CYCLOBENZAPRINE HCL 10 MG PO TABS
10.0000 mg | ORAL_TABLET | Freq: Three times a day (TID) | ORAL | Status: DC | PRN
  Administered 2020-10-12 – 2020-10-17 (×11): 10 mg via ORAL
  Filled 2020-10-12 (×13): qty 1

## 2020-10-12 MED ORDER — SEMAGLUTIDE 3 MG PO TABS: 3.0000 mg | ORAL_TABLET | Freq: Every day | ORAL | Status: DC

## 2020-10-12 MED ORDER — SODIUM CHLORIDE 0.9 % IV SOLN: INTRAVENOUS | Status: DC

## 2020-10-12 MED ORDER — ATENOLOL 50 MG PO TABS
50.0000 mg | ORAL_TABLET | Freq: Two times a day (BID) | ORAL | Status: DC
  Administered 2020-10-12 – 2020-10-17 (×11): 50 mg via ORAL
  Filled 2020-10-12 (×10): qty 1
  Filled 2020-10-12: qty 2
  Filled 2020-10-12: qty 1

## 2020-10-12 MED ORDER — GLIMEPIRIDE 2 MG PO TABS
2.0000 mg | ORAL_TABLET | Freq: Every day | ORAL | Status: DC
  Administered 2020-10-12: 2 mg via ORAL
  Filled 2020-10-12: qty 1

## 2020-10-12 MED ORDER — MAGNESIUM HYDROXIDE 400 MG/5ML PO SUSP
30.0000 mL | Freq: Every day | ORAL | Status: DC | PRN
  Filled 2020-10-12: qty 30

## 2020-10-12 MED ORDER — LEVETIRACETAM 500 MG PO TABS
500.0000 mg | ORAL_TABLET | Freq: Two times a day (BID) | ORAL | Status: DC
  Administered 2020-10-12 – 2020-10-17 (×12): 500 mg via ORAL
  Filled 2020-10-12 (×14): qty 1

## 2020-10-12 MED ORDER — MIRTAZAPINE 15 MG PO TABS
30.0000 mg | ORAL_TABLET | Freq: Every day | ORAL | Status: DC
  Administered 2020-10-12 (×2): 30 mg via ORAL
  Filled 2020-10-12 (×2): qty 2

## 2020-10-12 MED ORDER — ACETAMINOPHEN 650 MG RE SUPP: 650.0000 mg | Freq: Four times a day (QID) | RECTAL | Status: DC | PRN

## 2020-10-12 MED ORDER — ALPRAZOLAM 1 MG PO TABS
1.0000 mg | ORAL_TABLET | Freq: Three times a day (TID) | ORAL | Status: DC | PRN
  Administered 2020-10-12 – 2020-10-17 (×13): 1 mg via ORAL
  Filled 2020-10-12: qty 2
  Filled 2020-10-12 (×12): qty 1

## 2020-10-12 MED ORDER — TRAZODONE HCL 50 MG PO TABS
150.0000 mg | ORAL_TABLET | Freq: Every day | ORAL | Status: DC
  Administered 2020-10-12 (×2): 150 mg via ORAL
  Filled 2020-10-12: qty 3
  Filled 2020-10-12: qty 1

## 2020-10-12 MED ORDER — ASPIRIN EC 81 MG PO TBEC
81.0000 mg | DELAYED_RELEASE_TABLET | Freq: Every day | ORAL | Status: DC
  Administered 2020-10-12 – 2020-10-17 (×6): 81 mg via ORAL
  Filled 2020-10-12 (×6): qty 1

## 2020-10-12 MED ORDER — INSULIN ASPART 100 UNIT/ML IJ SOLN: 0.0000 [IU] | Freq: Every day | INTRAMUSCULAR | Status: DC

## 2020-10-12 MED ORDER — PNEUMOCOCCAL VAC POLYVALENT 25 MCG/0.5ML IJ INJ
0.5000 mL | INJECTION | INTRAMUSCULAR | Status: AC
  Administered 2020-10-14: 09:00:00 0.5 mL via INTRAMUSCULAR
  Filled 2020-10-12: qty 0.5

## 2020-10-12 MED ORDER — ACETAMINOPHEN 500 MG PO TABS
1000.0000 mg | ORAL_TABLET | Freq: Once | ORAL | Status: AC
  Administered 2020-10-12: 1000 mg via ORAL
  Filled 2020-10-12: qty 2

## 2020-10-12 MED ORDER — TRAZODONE HCL 50 MG PO TABS
25.0000 mg | ORAL_TABLET | Freq: Every evening | ORAL | Status: DC | PRN
  Administered 2020-10-13 – 2020-10-16 (×3): 25 mg via ORAL
  Filled 2020-10-12 (×3): qty 1

## 2020-10-12 MED ORDER — HYDROXYZINE HCL 25 MG PO TABS
25.0000 mg | ORAL_TABLET | Freq: Two times a day (BID) | ORAL | Status: DC
  Administered 2020-10-12 – 2020-10-13 (×3): 25 mg via ORAL
  Filled 2020-10-12 (×4): qty 1

## 2020-10-12 MED ORDER — ENOXAPARIN SODIUM 80 MG/0.8ML IJ SOSY
0.5000 mg/kg | PREFILLED_SYRINGE | INTRAMUSCULAR | Status: DC
  Administered 2020-10-12: 47.5 mg via SUBCUTANEOUS
  Filled 2020-10-12 (×2): qty 0.47

## 2020-10-12 MED ORDER — LISINOPRIL 5 MG PO TABS
5.0000 mg | ORAL_TABLET | Freq: Every day | ORAL | Status: DC
  Administered 2020-10-12 – 2020-10-17 (×6): 5 mg via ORAL
  Filled 2020-10-12 (×6): qty 1

## 2020-10-12 MED ORDER — ESCITALOPRAM OXALATE 20 MG PO TABS
20.0000 mg | ORAL_TABLET | Freq: Every day | ORAL | Status: DC
  Administered 2020-10-12 – 2020-10-17 (×6): 20 mg via ORAL
  Filled 2020-10-12 (×4): qty 1
  Filled 2020-10-12: qty 2
  Filled 2020-10-12: qty 1

## 2020-10-12 MED ORDER — ACETAMINOPHEN 325 MG PO TABS
650.0000 mg | ORAL_TABLET | Freq: Four times a day (QID) | ORAL | Status: DC | PRN
  Administered 2020-10-12 – 2020-10-17 (×13): 650 mg via ORAL
  Filled 2020-10-12 (×14): qty 2

## 2020-10-12 MED ORDER — POTASSIUM CHLORIDE CRYS ER 20 MEQ PO TBCR
40.0000 meq | EXTENDED_RELEASE_TABLET | Freq: Once | ORAL | Status: AC
  Administered 2020-10-12: 40 meq via ORAL
  Filled 2020-10-12: qty 2

## 2020-10-12 MED ORDER — ROSUVASTATIN CALCIUM 10 MG PO TABS
5.0000 mg | ORAL_TABLET | Freq: Every day | ORAL | Status: DC
  Administered 2020-10-12 – 2020-10-17 (×6): 5 mg via ORAL
  Filled 2020-10-12 (×7): qty 1

## 2020-10-12 MED ORDER — LACOSAMIDE 50 MG PO TABS
150.0000 mg | ORAL_TABLET | Freq: Two times a day (BID) | ORAL | Status: DC
  Administered 2020-10-12 – 2020-10-17 (×11): 150 mg via ORAL
  Filled 2020-10-12 (×11): qty 3

## 2020-10-12 MED ORDER — INSULIN ASPART 100 UNIT/ML IJ SOLN
0.0000 [IU] | INTRAMUSCULAR | Status: DC
  Administered 2020-10-12: 2 [IU] via SUBCUTANEOUS
  Filled 2020-10-12: qty 1

## 2020-10-12 MED ORDER — FERROUS SULFATE 325 (65 FE) MG PO TABS
325.0000 mg | ORAL_TABLET | Freq: Two times a day (BID) | ORAL | Status: DC
  Administered 2020-10-12 – 2020-10-17 (×10): 325 mg via ORAL
  Filled 2020-10-12 (×11): qty 1

## 2020-10-12 MED ORDER — INSULIN ASPART 100 UNIT/ML IJ SOLN
0.0000 [IU] | Freq: Three times a day (TID) | INTRAMUSCULAR | Status: DC
  Administered 2020-10-13: 3 [IU] via SUBCUTANEOUS
  Administered 2020-10-13: 17:00:00 2 [IU] via SUBCUTANEOUS
  Administered 2020-10-13: 10:00:00 5 [IU] via SUBCUTANEOUS
  Administered 2020-10-14: 3 [IU] via SUBCUTANEOUS
  Administered 2020-10-14: 2 [IU] via SUBCUTANEOUS
  Administered 2020-10-14 – 2020-10-15 (×2): 3 [IU] via SUBCUTANEOUS
  Administered 2020-10-15: 2 [IU] via SUBCUTANEOUS
  Administered 2020-10-15 – 2020-10-16 (×2): 3 [IU] via SUBCUTANEOUS
  Administered 2020-10-16: 2 [IU] via SUBCUTANEOUS
  Administered 2020-10-16: 3 [IU] via SUBCUTANEOUS
  Administered 2020-10-17: 5 [IU] via SUBCUTANEOUS
  Administered 2020-10-17: 08:00:00 3 [IU] via SUBCUTANEOUS
  Filled 2020-10-12 (×14): qty 1

## 2020-10-12 MED ORDER — PRIMIDONE 50 MG PO TABS
50.0000 mg | ORAL_TABLET | Freq: Every day | ORAL | Status: DC
  Administered 2020-10-12 – 2020-10-17 (×6): 50 mg via ORAL
  Filled 2020-10-12 (×6): qty 1

## 2020-10-12 MED ORDER — ONDANSETRON HCL 4 MG PO TABS: 4.0000 mg | ORAL_TABLET | Freq: Four times a day (QID) | ORAL | Status: DC | PRN

## 2020-10-12 NOTE — ED Notes (Signed)
PT at bedside.

## 2020-10-12 NOTE — ED Notes (Signed)
Pt given meal tray.

## 2020-10-12 NOTE — ED Notes (Signed)
Message sent to Dr Posey Pronto d/t pt still having pain and requesting flexeril

## 2020-10-12 NOTE — ED Notes (Signed)
Pt given remote for TV

## 2020-10-12 NOTE — Progress Notes (Signed)
[facility] PLAN OF CARE NOTE Patient: Christina French FHL:[id number]   PCP: [name], MD DOB: December 16, 1955   DOA: 10/11/2020   DOS: 10/12/2020    Patient was admitted by my colleague earlier on 10/12/2020. I have reviewed the H&P as well as assessment and plan and agree with the same. Important changes in the plan are listed below.  Plan of care: Active Problems:   Atrial fibrillation, unspecified type (HCC)   Vertigo    Recurrent dizziness and lightheadedness. Symptoms are still present but improving. Orthostatic vitals negative. MRI brain negative for acute stroke. PT OT recommends SNF. Suspect dizziness could be associated with her hyponatremia. Will monitor for improvement in symptoms now the sodium is corrected.  Right arm pain after fall Patient reported some right arm pain but x-rays are negative for any acute fracture.  Hyponatremia. Likely hypovolemic from poor p.o. intake. Although etiology not clear. Sodium improved significantly with simple IV fluids. We will currently hold it and monitor for improvement.   Author: Berle Mull, MD [facility] 10/12/2020 5:33 PM   If 7PM-7AM, please contact night-coverage at [email or web address]

## 2020-10-12 NOTE — H&P (Addendum)
[place]   PATIENT NAME: Christina French    MR#:  [id number]  DATE OF BIRTH:  November 10, 1955  DATE OF ADMISSION:  10/11/2020  PRIMARY CARE PHYSICIAN: [name], MD   Patient is coming from: Home  REQUESTING/REFERRING PHYSICIAN: Brenton Grills, MD  CHIEF COMPLAINT:   Chief Complaint  Patient presents with   Fall   Dizziness    HISTORY OF PRESENT ILLNESS:  Christina French is a 64 y.o. female with medical history significant for multiple medical problems that are mentioned below, who presented to the emergency room with acute onset of intractable dizziness with associated vertigo since yesterday.  The patient had subsequent 3 falls for which she called EMS each time.  After the fourth fall she was brought to the emergency room.  The patient denied any syncope or chest pain or palpitations.  No fever or chills.  No nausea or vomiting.  As a result of her falls she had bilateral elbow swelling and right shoulder pain.  No dysuria, oliguria or hematuria or flank pain.  She admitted to recent bronchitis about 3 weeks ago.  She has been mildly loose bowel movements.  No melena or bright red bleeding per rectum.  ED Course: When she came to the ER heart rate was 101 with otherwise normal vital signs.  Labs revealed hyponatremia hypochloremia and leukocytosis 15.1.  Influenza antigen and COVID-19 PCR came back negative.  UA was positive for UTI. EKG as reviewed by me : EKG showed sinus rhythm with rate of 100 with first-degree AV block. Imaging: Noncontrast CT scan revealed air-fluid level within the sphenoid sinus. Abdominal pelvic CT scan showed colonic diverticulosis without diverticulitis, hepatic steatosis, mild splenomegaly, gallstone versus wall calcification in the gallbladder neck without findings of acute cholecystitis.  It showed small hiatal hernia and aortic atherosclerosis.  The patient was given 650 mg of p.o. Tylenol, 50 mg of p.o. meclizine, 4 mg of IV Zofran and 1 L bolus  of IV normal saline PAST MEDICAL HISTORY:   Past Medical History:  Diagnosis Date   Anemia    Anxiety    Arthritis    Barrett esophagus    Cancer ([place]) 2016    Skin cancer right leg mylenoma   Depression    Diabetes ([place]) 04/25/2017   GERD (gastroesophageal reflux disease)    History of blood transfusion    with a surgery   History of bronchitis    History of pneumonia    Hypertension    IBS (irritable bowel syndrome)    Pneumonia 09/27/15   PTSD (post-traumatic stress disorder)    Renal insufficiency    reports acute kidney injury   Seizures ([place])    rare - last one 07/30/14 - was very anemic.  Silent seziure -June 2016-    Vertigo    thinks related to sinus issues    PAST SURGICAL HISTORY:   Past Surgical History:  Procedure Laterality Date   ABDOMINAL HYSTERECTOMY     CESAREAN SECTION     COLONOSCOPY N/A 08/31/2014   Procedure: COLONOSCOPY;  Surgeon: Lucilla Lame, MD;  Location: [place];  Service: Gastroenterology;  Laterality: N/A;   COLONOSCOPY N/A 08/05/2016   Procedure: COLONOSCOPY;  Surgeon: [name], MD;  Location: [facility] ENDOSCOPY;  Service: Endoscopy;  Laterality: N/A;   COLONOSCOPY WITH PROPOFOL N/A 12/22/2018   Procedure: COLONOSCOPY WITH PROPOFOL;  Surgeon: Virgel Manifold, MD;  Location: [facility] ENDOSCOPY;  Service: Endoscopy;  Laterality: N/A;  CYST EXCISION  2011   from throat   ESOPHAGOGASTRODUODENOSCOPY N/A 08/31/2014   Procedure: ESOPHAGOGASTRODUODENOSCOPY (EGD);  Surgeon: Lucilla Lame, MD;  Location: [place];  Service: Gastroenterology;  Laterality: N/A;   ESOPHAGOGASTRODUODENOSCOPY N/A 12/21/2018   Procedure: ESOPHAGOGASTRODUODENOSCOPY (EGD);  Surgeon: Virgel Manifold, MD;  Location: [facility] ENDOSCOPY;  Service: Endoscopy;  Laterality: N/A;   ESOPHAGOGASTRODUODENOSCOPY (EGD) WITH PROPOFOL N/A 07/01/2017   Procedure: ESOPHAGOGASTRODUODENOSCOPY (EGD) WITH PROPOFOL;  Surgeon: [name], MD;  Location: [facility] ENDOSCOPY;  Service:  Gastroenterology;  Laterality: N/A;   FLEXIBLE SIGMOIDOSCOPY N/A 07/01/2017   Procedure: FLEXIBLE SIGMOIDOSCOPY;  Surgeon: [name], MD;  Location: [facility] ENDOSCOPY;  Service: Gastroenterology;  Laterality: N/A;   GIVENS CAPSULE STUDY  12/22/2018   Procedure: GIVENS CAPSULE STUDY;  Surgeon: Virgel Manifold, MD;  Location: [facility] ENDOSCOPY;  Service: Endoscopy;;   HEMORRHOID SURGERY     INCISION AND DRAINAGE HIP Right 11/25/2014   Procedure: IRRIGATION  AND DRAINAGEOF RIGHT HIP WITH PLACEMENT OF ANTIBIOUTIC BEADS;  Surgeon: Mcarthur Rossetti, MD;  Location: [place];  Service: Orthopedics;  Laterality: Right;   INCISION AND DRAINAGE HIP Right 11/28/2014   Procedure: Repeat I&D Right Hip;  Surgeon: Mcarthur Rossetti, MD;  Location: [place];  Service: Orthopedics;  Laterality: Right;   JOINT REPLACEMENT Right 2007   hip   LOOP RECORDER INSERTION N/A 04/04/2019   Procedure: LOOP RECORDER INSERTION;  Surgeon: Isaias Cowman, MD;  Location: [place] CV [facility];  Service: Cardiovascular;  Laterality: N/A;   TOTAL HIP REVISION Right 03/19/2015   Procedure: RIGHT TOTAL HIP ARTHROPLASTY REVISION;  Surgeon: Meredith Pel, MD;  Location: [place];  Service: Orthopedics;  Laterality: Right;   TOTAL HIP REVISION Right 11/18/2015   Procedure: TOTAL HIP REVISION;  Surgeon: Frederik Pear, MD;  Location: [place];  Service: Orthopedics;  Laterality: Right;    SOCIAL HISTORY:   Social History   Tobacco Use   Smoking status: Never   Smokeless tobacco: Never  Substance Use Topics   Alcohol use: No    Alcohol/week: 0.0 standard drinks    FAMILY HISTORY:   Family History  Problem Relation Age of Onset   Alcoholism Father    Throat cancer Mother    Breast cancer Maternal Aunt     DRUG ALLERGIES:  No Known Allergies  REVIEW OF SYSTEMS:   ROS As per history of present illness. All pertinent systems were reviewed above. Constitutional, HEENT, cardiovascular, respiratory, GI, GU,  musculoskeletal, neuro, psychiatric, endocrine, integumentary and hematologic systems were reviewed and are otherwise negative/unremarkable except for positive findings mentioned above in the HPI.   MEDICATIONS AT HOME:   Prior to Admission medications   Medication Sig Start Date End Date Taking? Authorizing Provider  Accu-Chek Softclix Lancets lancets Use as instructed twice a day Dx E11.65 01/11/20   Ronnell Freshwater, NP  acetaminophen (TYLENOL) 500 MG tablet Take 1,000 mg by mouth 2 (two) times daily as needed for mild pain or headache.    [provider]  ALPRAZolam Duanne Moron) 1 MG tablet Take 1 tablet (1 mg total) by mouth 3 (three) times daily as needed for anxiety. 10/19/17   Ronnell Freshwater, NP  aspirin EC 81 MG tablet Take 81 mg by mouth daily.    [provider]  atenolol (TENORMIN) 50 MG tablet TAKE 1 TABLET BY MOUTH TWICE A DAY 09/03/20   [name], MD  Continuous Blood Gluc Sensor (DEXCOM G6 SENSOR) MISC Use every 14 days dx e11.65 05/31/20  Luiz Ochoa, NP  cyclobenzaprine (FLEXERIL) 10 MG tablet Take 1 tablet (10 mg total) by mouth 3 (three) times daily as needed for muscle spasms. 08/27/20   Luiz Ochoa, NP  escitalopram (LEXAPRO) 20 MG tablet Take 20 mg by mouth daily.     [provider]  ferrous sulfate 325 (65 FE) MG EC tablet Take 1 tablet (325 mg total) by mouth 2 (two) times daily. 12/23/18 04/04/19  Saundra Shelling, MD  gabapentin (NEURONTIN) 100 MG capsule TAKE ONE CAPSULE BY MOUTH TWICE A DAY 09/03/20   [name], MD  glimepiride (AMARYL) 2 MG tablet Take 1 tablet (2 mg total) by mouth daily before breakfast. 08/27/20   Luiz Ochoa, NP  glucose blood (ACCU-CHEK GUIDE) test strip Use as directed twice a strips twice a day E 11.65 01/11/20   Ronnell Freshwater, NP  Lacosamide 150 MG TABS Take 150 mg by mouth 2 (two) times daily.     [provider]  levETIRAcetam (KEPPRA) 500 MG tablet Take 500 mg by mouth 2 (two) times daily.     [provider]  lisinopril (ZESTRIL) 5 MG tablet TAKE 1 TABLET BY MOUTH DAILY 07/30/20   [name], MD  metFORMIN (GLUCOPHAGE) 500 MG tablet TAKE 1 TABLET BY MOUTH TWICE A DAY WITH A MEAL 09/03/20   [name], MD  mirtazapine (REMERON) 15 MG tablet Take 30 mg by mouth at bedtime.     [provider]  omeprazole (PRILOSEC) 40 MG capsule TAKE ONE CAPSULE BY MOUTH TWICE A DAY 08/08/20   [name], MD  primidone (MYSOLINE) 50 MG tablet Take 50 mg by mouth daily. 03/28/20 03/28/21  [provider]  propranolol (INDERAL) 10 MG tablet Take 10 mg by mouth. 03/28/20 03/28/21  [provider]  rosuvastatin (CRESTOR) 5 MG tablet TAKE 1 TABLET BY MOUTH DAILY 07/30/20   [name], MD  Semaglutide (RYBELSUS) 3 MG TABS Take 3 mg by mouth daily before breakfast. With no more than 4 oz of water. Wait 30 minutes before eating, drinking or taking the rest of your medications. 09/06/20   Jonetta Osgood, NP  traZODone (DESYREL) 50 MG tablet Take 50 mg by mouth at bedtime.    [provider]      VITAL SIGNS:  Blood pressure (!) 130/91, pulse 88, temperature 98 F (36.7 C), temperature source Oral, resp. rate 20, height 5\' 1"  (1.549 m), weight 93.6 kg, SpO2 96 %.  PHYSICAL EXAMINATION:  Physical Exam  GENERAL:  65 y.o.-year-old Caucasian female patient lying in the bed with no acute distress.  EYES: Pupils equal, round, reactive to light and accommodation. No scleral icterus. Extraocular muscles intact.  HEENT: Head atraumatic, normocephalic. Oropharynx and nasopharynx clear.  NECK:  Supple, no jugular venous distention. No thyroid enlargement, no tenderness.  LUNGS: Normal breath sounds bilaterally, no wheezing, rales,rhonchi or crepitation. No use of accessory muscles of respiration.  CARDIOVASCULAR: Regular rate and rhythm, S1, S2 normal. No murmurs, rubs, or gallops.  ABDOMEN: Soft, nondistended, nontender. Bowel sounds present. No organomegaly or mass.   EXTREMITIES: No pedal edema, cyanosis, or clubbing.  NEUROLOGIC: Cranial nerves II through XII are intact. Muscle strength 5/5 in all extremities. Sensation intact. Gait not checked. Musculoskeletal: Bilateral elbow contusions with adequate range of motion's of both elbows.  Mildly diminished right shoulder range of motion secondary to pain.  No deformity. PSYCHIATRIC: The patient is alert and oriented x 3.  Normal affect and good  eye contact. SKIN: No obvious rash, lesion, or ulcer.   LABORATORY PANEL:   CBC Recent Labs  Lab 10/11/20 1341  WBC 15.1*  HGB 12.5  HCT 35.1*  PLT 170   ------------------------------------------------------------------------------------------------------------------  Chemistries  Recent Labs  Lab 10/11/20 1341  NA 127*  K 3.7  CL 92*  CO2 24  GLUCOSE 171*  BUN 15  CREATININE 1.01*  CALCIUM 8.8*   ------------------------------------------------------------------------------------------------------------------  Cardiac Enzymes No results for input(s): TROPONINI in the last 168 hours. ------------------------------------------------------------------------------------------------------------------  RADIOLOGY:  CT ABDOMEN PELVIS WO CONTRAST  Result Date: 10/11/2020 CLINICAL DATA:  Diverticulitis suspected Patient reports dizziness leading to fall.  Multiple falls today. EXAM: CT ABDOMEN AND PELVIS WITHOUT CONTRAST TECHNIQUE: Multidetector CT imaging of the abdomen and pelvis was performed following the standard protocol without IV contrast. COMPARISON:  Remote CT 12/04/2010 FINDINGS: Lower chest: Subsegmental atelectasis in the lung bases. Heart is normal in size. Hepatobiliary: Diffusely decreased hepatic density typical of steatosis. There is no evidence of focal lesion. Question of micro nodular hepatic contours. Small calcified gallstone versus wall calcification in the gallbladder neck. No pericholecystic inflammation. There is no biliary  dilatation. Pancreas: Fatty atrophy.  No ductal dilatation or inflammation. Spleen: Mild splenomegaly with spleen measuring 14.0 x 7.4 x 8.9 cm (volume = 480 cm^3). No focal abnormality. No perisplenic fluid. Adrenals/Urinary Tract: Normal adrenal glands. There is no hydronephrosis or perinephric edema. Slight lobulated renal contours. No renal calculi. No evidence of focal renal lesion. Decompressed ureters. Partially distended urinary bladder. Stomach/Bowel: Small hiatal hernia. Stomach otherwise physiologically distended. Decompressed small bowel without obstruction or inflammation. Appendix is not definitively visualized, no appendicitis. There is moderate volume of colonic stool. Mild diverticulosis without diverticulitis. Chain sutures noted in the distal sigmoid/rectum. There is a 2.6 cm segment of nondistended proximal sigmoid colon, that is surrounded by a stool, unclear if this represents a focal colonic narrowing or area of peristalsis. No associated fat stranding. Vascular/Lymphatic: Mild aortic atherosclerosis. No aortic aneurysm. No bulky abdominopelvic adenopathy. Reproductive: Hysterectomy without adnexal mass. Other: No ascites, free air, or focal fluid collection. No body wall hernia. Musculoskeletal: Right hip replacement partially visualized. Mild scoliosis and degenerative change in the spine. There are no acute or suspicious osseous abnormalities. IMPRESSION: 1. Colonic diverticulosis without diverticulitis. There is a 2.6 cm segment of nondistended proximal sigmoid colon, that is surrounded by a stool. It is unclear if this represents a focal colonic narrowing or area of peristalsis. Recommend correlation with colonoscopy. 2. Hepatic steatosis. Question of micro nodular hepatic contours, can be seen with cirrhosis. Recommend correlation with cirrhosis risk factors. Mild splenomegaly. 3. Gallstone versus wall calcification in the gallbladder neck without CT findings of acute cholecystitis. 4.  Small hiatal hernia. Aortic Atherosclerosis (ICD10-I70.0). Electronically Signed   By: Keith Rake M.D.   On: 10/11/2020 17:29   CT Head Wo Contrast  Result Date: 10/11/2020 CLINICAL DATA:  Acute neuro deficit.  Fall, dizziness EXAM: CT HEAD WITHOUT CONTRAST TECHNIQUE: Contiguous axial images were obtained from the base of the skull through the vertex without intravenous contrast. COMPARISON:  MRI head 01/20/2019 FINDINGS: Brain: No evidence of acute infarction, hemorrhage, hydrocephalus, extra-axial collection or mass lesion/mass effect. Vascular: Negative for hyperdense vessel Skull: Negative Sinuses/Orbits: Air-fluid level sphenoid sinus. Remaining sinuses clear. Negative orbit Other: None IMPRESSION: No acute abnormality Air-fluid level sphenoid sinus. Electronically Signed   By: Franchot Gallo M.D.   On: 10/11/2020 17:16      IMPRESSION AND PLAN:  Active Problems:  Atrial fibrillation, unspecified type (HCC)   Vertigo  1.  Intractable dizziness and vertigo, likely benign positional vertigo. - The patient will be admitted to an observation medically monitored bed. - We will follow neurochecks every 4 hours for 24 hours. - PT consult to be obtained for vestibular rehabitation. - The patient will be placed on p.o. meclizine on a as needed basis at 12.5 mg p.o. 3 times daily as needed. - Brain MRI showed no acute intracranial normalities.  2.  Depression and anxiety. - We will continue Lexapro and Xanax.  3.  Essential hypertension. - Continue Tenormin and Zestril.  4.  Type 2 diabetes mellitus. - We will continue her on supplement coverage with NovoLog and hold off her metformin. - We will continue her basal coverage.  5.  Dyslipidemia. - We will continue statin therapy.  DVT prophylaxis: Lovenox.  Code Status: full code.  Family Communication:  The plan of care was discussed in details with the patient (and family). I answered all questions. The patient agreed to proceed  with the above mentioned plan. Further management will depend upon hospital course. Disposition Plan: Back to previous home environment Consults called: none.  All the records are reviewed and case discussed with ED provider.  Status is: Observation  The patient remains OBS appropriate and will d/c before 2 midnights.  Dispo: The patient is from: Home              Anticipated d/c is to: Home              Patient currently is not medically stable to d/c.   Difficult to place patient No      TOTAL TIME TAKING CARE OF THIS PATIENT: 55 minutes.    Christel Mormon M.D on 10/12/2020 at 12:23 AM  [facility]   From 7 PM-7 AM, contact night-coverage [email or web address]  CC: Primary care physician; [name], MD

## 2020-10-12 NOTE — ED Notes (Signed)
Message sent to pharmacy for crestor

## 2020-10-12 NOTE — ED Notes (Signed)
Pt taken to xray 

## 2020-10-12 NOTE — ED Notes (Signed)
Fluids switched to L arm IV d/t pt bending arm

## 2020-10-12 NOTE — ED Notes (Signed)
Pt given tooth brush and tooth paste

## 2020-10-12 NOTE — ED Notes (Signed)
Pt cleansed of stool, clean purwick and brief placed

## 2020-10-12 NOTE — Evaluation (Signed)
Physical Therapy Evaluation Patient Details Name: Christina French MRN: [id number] DOB: June 30, 1955 Today's Date: 10/12/2020   History of Present Illness  Pt is a 65 y/o F admitted on 10/11/20 with c/c of dizziness & 3 falls. Pt being treated for intractable dizziness & vertigo, likely BPPV. PMH: anxiety, arthritis, skin CA, depression, DM, GERD, HTN, IBS, PTSD, sizures, vertigo  Clinical Impression  Pt seen for PT evaluation with pt endorsing significant R shoulder pain with limited use of UE during mobility. Pt also limited in mobility 2/2 pain. Pt requires max assist for bed mobility and mod/max assist for sit>stand & to take a couple steps EOB. Pt continues to lean back on bed for support & would benefit from RW trial but is limited on this date by dizziness that worsens with change in position. No orthostatic hypotension noted, nor any nystagmus was observed with rolling in bed or supine>sit. Pt would benefit from more formal BPPV assessment. Will continue to follow pt acutely to address mobility as able. At this time, recommending STR upon d/c to maximize independence with mobility & reduce fall risk; pt is also unsafe to d/c home alone at this time.   BP checked in R wrist: Supine: 136/82 mmHg (MAP 96), HR 88 bpm Sitting: 151/109 mmhg (MAP 122) Rechecked in sitting: 154/102 mmHg (MAP 119) After trying to stand: 158/120 mmHg (MAP 133) Returned to supine in bed: 142/91 mmHg (MAP 107)     Follow Up Recommendations SNF    Equipment Recommendations   (TBD in next venue)    Recommendations for Other Services       Precautions / Restrictions Precautions Precautions: Fall Restrictions Weight Bearing Restrictions: No      Mobility  Bed Mobility Overal bed mobility: Needs Assistance Bed Mobility: Supine to Sit;Sit to Supine;Rolling Rolling: Min guard (unable to roll to R sidelying 2/2 R shoulder pain, rolled to L with CGA)   Supine to sit: Max assist;HOB elevated Sit to supine: Max  assist;HOB elevated   General bed mobility comments: requires physical assistance as pt is limited by pain with movement, limited use of R shoulder    Transfers Overall transfer level: Needs assistance Equipment used: 1 person hand held assist Transfers: Sit to/from Stand Sit to Stand: Mod assist;From elevated surface            Ambulation/Gait Ambulation/Gait assistance: Mod assist;Max assist Gait Distance (Feet): 2 Feet Assistive device: 1 person hand held assist   Gait velocity: decreased   General Gait Details: Pt takes a couple of steps to L with HHA to [facility] but pt continues to lean back onto EOB for support/balance. Would benefit from RW trial but pt limited by dizziness with movement.  Stairs            Wheelchair Mobility    Modified Rankin (Stroke Patients Only)       Balance Overall balance assessment: Needs assistance Sitting-balance support: Feet unsupported;Single extremity supported Sitting balance-Leahy Scale: Fair       Standing balance-Leahy Scale: Poor                               Pertinent Vitals/Pain Pain Assessment: Faces Faces Pain Scale: Hurts whole lot Pain Location: R shoulder Pain Descriptors / Indicators: Grimacing;Sore Pain Intervention(s): Limited activity within patient's tolerance;Monitored during session;Repositioned    Home Living Family/patient expects to be discharged to:: Private residence Living Arrangements: Children Available Help at Discharge: Available PRN/intermittently;Family  Type of Home: Apartment Home Access: Level entry;Other (comment) (4 steps with B rails + curb step to access level entry door)     Home Layout: One level Home Equipment: [place] - 2 wheels      Prior Function Level of Independence: Independent with assistive device(s)         Comments: Mod I with SBQC, endorses only these 3-4 falls in the past 6 months. Doesn't drive 2/2 hx of seizures, lives alone & cares  for herself     Hand Dominance        Extremity/Trunk Assessment   Upper Extremity Assessment Upper Extremity Assessment:  (pt with limited willingness to move BUE 2/2 bruising (redness noted to B elbows, wrists) & pt with c/o R shoulder pain (recently returned from xray))    Lower Extremity Assessment Lower Extremity Assessment: Generalized weakness       Communication   Communication: No difficulties  Cognition Arousal/Alertness: Awake/alert Behavior During Therapy: WFL for tasks assessed/performed Overall Cognitive Status: Within Functional Limits for tasks assessed                                 General Comments: very pleasant lady & willing to participate      General Comments      Exercises     Assessment/Plan    PT Assessment Patient needs continued PT services  PT Problem List Decreased strength;Decreased mobility;Decreased range of motion;Decreased activity tolerance;Decreased balance;Decreased knowledge of use of DME;Pain;Decreased skin integrity       PT Treatment Interventions DME instruction;Therapeutic exercise;Balance training;Stair training;Gait training;Functional mobility training;Neuromuscular re-education;Modalities;Therapeutic activities;Patient/family education    PT Goals (Current goals can be found in the Care Plan section)  Acute Rehab PT Goals Patient Stated Goal: less pain PT Goal Formulation: With patient Time For Goal Achievement: 10/26/20 Potential to Achieve Goals: Fair    Frequency Min 2X/week   Barriers to discharge Decreased caregiver support;Inaccessible home environment lives alone with steps to access apartment    Co-evaluation               AM-PAC PT "6 Clicks" Mobility  Outcome Measure Help needed turning from your back to your side while in a flat bed without using bedrails?: A Lot Help needed moving from lying on your back to sitting on the side of a flat bed without using bedrails?: Total Help  needed moving to and from a bed to a chair (including a wheelchair)?: Total Help needed standing up from a chair using your arms (e.g., wheelchair or bedside chair)?: A Lot Help needed to walk in hospital room?: Total Help needed climbing 3-5 steps with a railing? : Total 6 Click Score: 8    End of Session   Activity Tolerance: Patient limited by pain (limited by dizziness) Patient left: in bed;with call bell/phone within reach Nurse Communication:  (BP) PT Visit Diagnosis: Unsteadiness on feet (R26.81);Difficulty in walking, not elsewhere classified (R26.2);Muscle weakness (generalized) (M62.81);Pain Pain - Right/Left: Right Pain - part of body: Shoulder    Time: 1322-1340 PT Time Calculation (min) (ACUTE ONLY): 18 min   Charges:   PT Evaluation $PT Eval Moderate Complexity: [place], PT, DPT 10/12/20, 2:47 PM   Waunita Schooner 10/12/2020, 2:43 PM

## 2020-10-12 NOTE — Progress Notes (Signed)
PHARMACIST - PHYSICIAN COMMUNICATION  CONCERNING:  Enoxaparin (Lovenox) for DVT Prophylaxis    RECOMMENDATION: Patient was prescribed enoxaprin 40mg  q24 hours for VTE prophylaxis.   Filed Weights   10/11/20 1325  Weight: 93.6 kg (206 lb 5.6 oz)    Body mass index is 38.99 kg/m.  Estimated Creatinine Clearance: 59.5 mL/min (A) (by C-G formula based on SCr of 1.01 mg/dL (H)).   Based on [place] patient is candidate for enoxaparin 0.5mg /kg TBW SQ every 24 hours based on BMI being >30.   DESCRIPTION: Pharmacy has adjusted enoxaparin dose per [facility] policy.  Patient is now receiving enoxaparin 0.5 mg/kg every 24 hours   Renda Rolls, PharmD, [facility] 10/12/2020 1:46 AM

## 2020-10-13 DIAGNOSIS — R42 Dizziness and giddiness: Secondary | ICD-10-CM | POA: Diagnosis not present

## 2020-10-13 DIAGNOSIS — H8112 Benign paroxysmal vertigo, left ear: Secondary | ICD-10-CM | POA: Diagnosis not present

## 2020-10-13 LAB — CBC
HCT: 33.7 % — ABNORMAL LOW (ref 36.0–46.0)
Hemoglobin: 11.5 g/dL — ABNORMAL LOW (ref 12.0–15.0)
MCH: 27 pg (ref 26.0–34.0)
MCHC: 34.1 g/dL (ref 30.0–36.0)
MCV: 79.1 fL — ABNORMAL LOW (ref 80.0–100.0)
Platelets: 117 10*3/uL — ABNORMAL LOW (ref 150–400)
RBC: 4.26 MIL/uL (ref 3.87–5.11)
RDW: 13.3 % (ref 11.5–15.5)
WBC: 5 10*3/uL (ref 4.0–10.5)
nRBC: 0 % (ref 0.0–0.2)

## 2020-10-13 LAB — GLUCOSE, CAPILLARY
Glucose-Capillary: 221 mg/dL — ABNORMAL HIGH (ref 70–99)
Glucose-Capillary: 168 mg/dL — ABNORMAL HIGH (ref 70–99)
Glucose-Capillary: 127 mg/dL — ABNORMAL HIGH (ref 70–99)
Glucose-Capillary: 160 mg/dL — ABNORMAL HIGH (ref 70–99)

## 2020-10-13 LAB — FERRITIN: Ferritin: 16 ng/mL (ref 11–307)

## 2020-10-13 LAB — IRON AND TIBC
Iron: 262 ug/dL — ABNORMAL HIGH (ref 28–170)
Saturation Ratios: 73 % — ABNORMAL HIGH (ref 10.4–31.8)
TIBC: 360 ug/dL (ref 250–450)
UIBC: 98 ug/dL

## 2020-10-13 LAB — BASIC METABOLIC PANEL
Anion gap: 5 (ref 5–15)
BUN: 8 mg/dL (ref 8–23)
CO2: 29 mmol/L (ref 22–32)
Calcium: 8.4 mg/dL — ABNORMAL LOW (ref 8.9–10.3)
Chloride: 102 mmol/L (ref 98–111)
Creatinine, Ser: 0.73 mg/dL (ref 0.44–1.00)
GFR, Estimated: >60 mL/min (ref >60)
Glucose, Bld: 154 mg/dL — ABNORMAL HIGH (ref 70–99)
Potassium: 3.9 mmol/L (ref 3.5–5.1)
Sodium: 136 mmol/L (ref 135–145)

## 2020-10-13 MED ORDER — TRAZODONE HCL 50 MG PO TABS
100.0000 mg | ORAL_TABLET | Freq: Every day | ORAL | Status: DC
  Administered 2020-10-13 – 2020-10-16 (×4): 100 mg via ORAL
  Filled 2020-10-13 (×4): qty 2

## 2020-10-13 MED ORDER — MECLIZINE HCL 25 MG PO TABS
25.0000 mg | ORAL_TABLET | Freq: Three times a day (TID) | ORAL | Status: DC
  Administered 2020-10-13 – 2020-10-17 (×13): 25 mg via ORAL
  Filled 2020-10-13 (×15): qty 1

## 2020-10-13 MED ORDER — MIRTAZAPINE 15 MG PO TABS
15.0000 mg | ORAL_TABLET | Freq: Every day | ORAL | Status: DC
  Administered 2020-10-13 – 2020-10-16 (×4): 15 mg via ORAL
  Filled 2020-10-13 (×4): qty 1

## 2020-10-13 NOTE — Plan of Care (Signed)
Patient orientedx4, VSS, NSR on telemetry. C/o pain addressed with scheduled meds and PRN tylenol. PRN xanax given for anxiety. PRN trazodone utilized due to pt reporting waking up and feeling like she was falling. Mild vertigo while performing incontinence care, occurred while rolling from side to side. Adequate UO overnight. Fall/safety precautions in place, rounding performed, needs/concerns addressed.   Problem: Education: Goal: Knowledge of General Education information will improve Description: Including pain rating scale, medication(s)/side effects and non-pharmacologic comfort measures Outcome: Progressing   Problem: Health Behavior/Discharge Planning: Goal: Ability to manage health-related needs will improve Outcome: Progressing   Problem: Clinical Measurements: Goal: Ability to maintain clinical measurements within normal limits will improve Outcome: Progressing Goal: Will remain free from infection Outcome: Progressing Goal: Diagnostic test results will improve Outcome: Progressing Goal: Respiratory complications will improve Outcome: Progressing Goal: Cardiovascular complication will be avoided Outcome: Progressing   Problem: Activity: Goal: Risk for activity intolerance will decrease Outcome: Progressing   Problem: Nutrition: Goal: Adequate nutrition will be maintained Outcome: Progressing   Problem: Coping: Goal: Level of anxiety will decrease Outcome: Progressing   Problem: Elimination: Goal: Will not experience complications related to bowel motility Outcome: Progressing Goal: Will not experience complications related to urinary retention Outcome: Progressing   Problem: Pain Managment: Goal: General experience of comfort will improve Outcome: Progressing   Problem: Safety: Goal: Ability to remain free from injury will improve Outcome: Progressing   Problem: Skin Integrity: Goal: Risk for impaired skin integrity will decrease Outcome: Progressing

## 2020-10-13 NOTE — Progress Notes (Signed)
[facility] Progress Note  Patient: Christina French    YQM:[id number]  DOA: 10/11/2020     Date of Service: the patient was seen and examined on 10/13/2020  Brief hospital course: Past medical history of anxiety, tremors, HTN, PTSD, on multiple psychotropic medication, type II DM, GERD.  Presents with complaints of recurrent dizziness and fall. Appears to have BPPV. Currently plan is continue to educate and treat with vestibular rehab.  Will require SNF.  Assessment and Plan: 1.  BPPV. Recurrent dizziness and lightheadedness. Symptoms are still present but improving. Orthostatic vitals negative. MRI brain negative for acute stroke. PT OT recommends SNF. Suspect dizziness could be associated with her hyponatremia and also found to have positive Dix-Hallpike for posterior canal BPPV on the left. PT educated the patient with improved symptoms but still persistent. Currently on scheduled meclizine as well.  Although may not needed. Social worker consult for SNF. Should be able to go to SNF tomorrow.  2. Right arm pain after fall Patient reported some right arm pain but x-rays are negative for any acute fracture.  3. Hyponatremia. Likely hypovolemic from poor p.o. intake. Although etiology not clear.  No history that can cause hypovolemia or dehydration. Sodium improved significantly with simple IV fluids. We will currently hold it and monitor for improvement.  4.  Depression, anxiety, essential tremor Patient is on multiple psychotropic medication. She is on Lexapro, Remeron, trazodone. At present we will discontinue Vistaril.  Reduce the dose of the Remeron and trazodone as this can also contribute to patient's dizziness. Continue primidone for essential tremor.  5.  History of seizure disorder Continue current regimen.  No evidence of seizures right now.  6.  HTN Blood pressure stable. Continuing current regimen.  7.  Type 2 diabetes mellitus Continue sliding scale  insulin. Holding home regimen.  8.  HLD. Continuing statin.  9.  Obesity. Potential OSA. Patient will benefit from outpatient sleep study. At risk for poor outcome. Body mass index is 38.99 kg/m.   Diet: Cardiac diet DVT Prophylaxis: Subcutaneous Lovenox      Advance goals of care discussion: Full code  Family Communication: no family was present at bedside, at the time of interview.  Disposition:  Status is: Observation  Dispo: The patient is from: Home              Anticipated d/c is to: SNF              Patient currently is not medically stable to d/c.   Difficult to place patient No  Subjective: No nausea no vomiting.  Continues to have dizziness and lightheadedness.  Unable to sleep secondary to feeling dizzy even at night.  No no fever no chills.  No diarrhea no constipation.  Physical Exam:  General: Appear in mild distress, no Rash; Oral Mucosa Clear, moist. no Abnormal Neck Mass Or lumps, Conjunctiva normal  Cardiovascular: S1 and S2 Present, no Murmur, Respiratory: good respiratory effort, Bilateral Air entry present and CTA, no Crackles, no wheezes Abdomen: Bowel Sound present, Soft and no tenderness Extremities: no Pedal edema Neurology: alert and oriented to time, place, and person affect appropriate. no new focal deficit Gait not checked due to patient safety concerns  Vitals:   10/13/20 0927 10/13/20 1143 10/13/20 1314 10/13/20 1524  BP: (!) 164/95 (!) 160/92  140/83  Pulse: 92 87 (!) 101 84  Resp: 17 15 19 16   Temp: 97.6 F (36.4 C) 98.4 F (36.9 C)  97.7 F (36.5 C)  TempSrc:      SpO2: 100% 96%  98%  Weight:      Height:        Intake/Output Summary (Last 24 hours) at 10/13/2020 1616 Last data filed at 10/13/2020 1300 Gross per 24 hour  Intake 1478.35 ml  Output 1550 ml  Net -71.65 ml   Filed Weights   10/11/20 1325  Weight: 93.6 kg    Data Reviewed: I have personally reviewed and interpreted daily labs, tele strips, imaging. I  reviewed all nursing notes, pharmacy notes, vitals, pertinent old records I have discussed plan of care as described above with RN and patient/family.  CBC: Recent Labs  Lab 10/11/20 1341 10/12/20 0517 10/13/20 0520  WBC 15.1* 7.3 5.0  HGB 12.5 11.4* 11.5*  HCT 35.1* 33.7* 33.7*  MCV 76.8* 79.1* 79.1*  PLT 170 123* 169*   Basic Metabolic Panel: Recent Labs  Lab 10/11/20 1341 10/12/20 0517 10/12/20 1431 10/13/20 0520  NA 127* 129* 133* 136  K 3.7 3.2* 3.9 3.9  CL 92* 94* 99 102  CO2 24 28 29 29   GLUCOSE 171* 162* 123* 154*  BUN 15 12 9 8   CREATININE 1.01* 0.82 0.80 0.73  CALCIUM 8.8* 8.4* 8.4* 8.4*  MG  --   --  1.7  --     Studies: No results found.  Scheduled Meds:  aspirin EC  81 mg Oral Daily   atenolol  50 mg Oral BID   escitalopram  20 mg Oral Daily   ferrous sulfate  325 mg Oral BID   gabapentin  100 mg Oral BID   insulin aspart  0-15 Units Subcutaneous TID WC   insulin aspart  0-5 Units Subcutaneous QHS   lacosamide  150 mg Oral BID   levETIRAcetam  500 mg Oral BID   lisinopril  5 mg Oral Daily   meclizine  25 mg Oral TID   mirtazapine  15 mg Oral QHS   pantoprazole  40 mg Oral Daily   pneumococcal 23 valent vaccine  0.5 mL Intramuscular Tomorrow-1000   primidone  50 mg Oral Daily   rosuvastatin  5 mg Oral Daily   traZODone  100 mg Oral QHS   Continuous Infusions: PRN Meds: acetaminophen **OR** acetaminophen, ALPRAZolam, cyclobenzaprine, magnesium hydroxide, ondansetron **OR** ondansetron (ZOFRAN) IV, traZODone  Time spent: 35 minutes  Author: Berle Mull, MD [facility] 10/13/2020 4:16 PM  To reach On-call, see care teams to locate the attending and reach out via [email or web address]. Between 7PM-7AM, please contact night-coverage If you still have difficulty reaching the attending provider, please page the [facility] (Director on Call) for [facility] on amion for assistance.

## 2020-10-13 NOTE — Progress Notes (Signed)
Physical Therapy Treatment Patient Details Name: Christina French MRN: [id number] DOB: 09-Oct-1955 Today's Date: 10/13/2020    History of Present Illness Pt is a 65 y/o F admitted on 10/11/20 with c/c of dizziness & 3 falls. Pt being treated for intractable dizziness & vertigo, likely BPPV. PMH: anxiety, arthritis, skin CA, depression, DM, GERD, HTN, IBS, PTSD, sizures, vertigo    PT Comments    Pt seen today for vestibular screen and to progress mobility. Upbeating torsional nystagmus noted with L dix-hallpike indicating (+) L posterior canal BPPV. Pt with improved symptoms, nystagmus, and duration of symptoms s/p x2 rounds of L semont maneuver. Pt able to progress overall assist levels, ambulation distance, balance, and activity tolerance since IE. She required supervision for bed mobility, CGA for transfers, and CGA-min assist for gait, with x1 LOB while using RW. Increased time required during session for toileting, peri-care, and BM; pt with blood/clotting in BM to which pt notes this is "normal due to internal hemorrhoids" - RN notified. Despite progress, pt continues to be limited with meeting goals secondary to generalized weakness, decreased upright tolerance, impaired CVP tolerance to activity, and poor safety awareness. Pt with RPE of 2-3/10 indicating "light activity" after functional mobility, however, pt demonstrates labored breathing and RR of 34 breaths/min after moderate activity. Deficits limit the pt's ability for safe and independent gait, transfers, and performance of ADL's/IADL's. Pt will continue to benefit from skilled acute PT services to address deficits for return to baseline function. Will continue to recommend SNF at DC; pt agreeable.     Follow Up Recommendations  SNF     Equipment Recommendations   (defer to post acute)    Recommendations for Other Services       Precautions / Restrictions Precautions Precautions: Fall Restrictions Weight Bearing Restrictions: No     Mobility  Bed Mobility Overal bed mobility: Needs Assistance Bed Mobility: Rolling;Supine to Sit;Sit to Supine Rolling: Supervision   Supine to sit: HOB elevated;Min guard     General bed mobility comments: Supervision-CGA for safety for bed mobility. Increased time/effort for supine>sit transfer due to generalized weakness and shoulder pain    Transfers Overall transfer level: Needs assistance Equipment used: Rolling walker (2 wheeled) Transfers: Sit to/from Stand Sit to Stand: Min guard         General transfer comment: CGA for safety to perform STS transfers from EOB and BSC with RW; verbal cues for hand placement on RW.  Ambulation/Gait Ambulation/Gait assistance: Min guard Gait Distance (Feet): 104 Feet (44ft x2 to BSC, 137ft x1) Assistive device: Rolling walker (2 wheeled)   Gait velocity: decreased   General Gait Details: CGA for safety to ambulate with RW. Pt demonstrates slowed cadence, decreased step length/foot clearance bil, narrow BOS, and decreased RW proximity. x1 LOB due to scissoring, which she was able to correct with cues for widened BOS and min assist. Denies dizziness. RR up to 34 breaths/min and HR up to 104 bpm. RPE of 2-3/10 indicating "light activity" with labored breathing.     Balance Overall balance assessment: Needs assistance Sitting-balance support: Feet supported;No upper extremity supported Sitting balance-Leahy Scale: Fair     Standing balance support: During functional activity;Bilateral upper extremity supported Standing balance-Leahy Scale: Fair Standing balance comment: x1 LOB during gait. Able to brush teeth standing at counter without UE support and wide BOS.  Cognition Arousal/Alertness: Awake/alert Behavior During Therapy: WFL for tasks assessed/performed Overall Cognitive Status: Within Functional Limits for tasks assessed                                 General  Comments: alert and able to follow 100% of simple 2 step commands      Exercises Other Exercises Other Exercises: Pt able to participate in bed mobility, transfers, gait, and toileting with RW and supervision-CGA. x1 LOB during gait, which she was able to correct with min assist and cues for widened BOS. Pt asymptomatic during functional mobility. Other Exercises: Vestibular screen performed. (-) smooth pursuits and saccades. Upbeating torsional nystagmus with L dix-hallpike indicating L posterior canal involvement. Pt performed x2 rounds of L semont maneuver, demonstrating no nystagmus, mild dizziness, and decreased duration of symptoms. Other Exercises: Pt educated regarding: PT role/POC, DC recommendations, use of RW for energy conservation/safety, safe use with RW with gait/transfers, signs and symptoms of vestibular involvement, and semont maneuver. She verbalized understanding of all education.    General Comments General comments (skin integrity, edema, etc.): (-) smooth pursuits and saccades. upbeating torsional nystagmus noted with L dix-hallpike indicating (+) L posterior canal BPPV involvement with mild-mod dizziness.      Pertinent Vitals/Pain Pain Assessment: Faces Faces Pain Scale: Hurts a little bit Pain Location: R shoulder with bed mobility Pain Descriptors / Indicators: Grimacing;Sore Pain Intervention(s): Limited activity within patient's tolerance;Monitored during session;Repositioned     PT Goals (current goals can now be found in the care plan section) Acute Rehab PT Goals Patient Stated Goal: less pain PT Goal Formulation: With patient Time For Goal Achievement: 10/26/20 Potential to Achieve Goals: Fair Progress towards PT goals: Progressing toward goals    Frequency    Min 2X/week      PT Plan Current plan remains appropriate       AM-PAC PT "6 Clicks" Mobility   Outcome Measure  Help needed turning from your back to your side while in a flat bed  without using bedrails?: A Little Help needed moving from lying on your back to sitting on the side of a flat bed without using bedrails?: A Little Help needed moving to and from a bed to a chair (including a wheelchair)?: A Little Help needed standing up from a chair using your arms (e.g., wheelchair or bedside chair)?: A Little Help needed to walk in hospital room?: A Little Help needed climbing 3-5 steps with a railing? : A Lot 6 Click Score: 17    End of Session Equipment Utilized During Treatment: Gait belt Activity Tolerance: Patient tolerated treatment well Patient left: in chair;with call bell/phone within reach;with chair alarm set Nurse Communication: Mobility status (bloody BM, (+) L posterior BPPV) PT Visit Diagnosis: Unsteadiness on feet (R26.81);Difficulty in walking, not elsewhere classified (R26.2);Muscle weakness (generalized) (M62.81);Pain;BPPV BPPV - Right/Left : Left Pain - Right/Left: Right Pain - part of body: Shoulder     Time: 2426-8341 PT Time Calculation (min) (ACUTE ONLY): 53 min  Charges:  $Therapeutic Activity: 23-37 mins $Canalith Rep Proc: 23-37 mins                    Herminio Commons, PT, DPT 1:59 PM,10/13/20

## 2020-10-14 DIAGNOSIS — R42 Dizziness and giddiness: Secondary | ICD-10-CM | POA: Diagnosis not present

## 2020-10-14 DIAGNOSIS — H8112 Benign paroxysmal vertigo, left ear: Secondary | ICD-10-CM | POA: Diagnosis not present

## 2020-10-14 LAB — GLUCOSE, CAPILLARY
Glucose-Capillary: 146 mg/dL — ABNORMAL HIGH (ref 70–99)
Glucose-Capillary: 167 mg/dL — ABNORMAL HIGH (ref 70–99)
Glucose-Capillary: 161 mg/dL — ABNORMAL HIGH (ref 70–99)
Glucose-Capillary: 158 mg/dL — ABNORMAL HIGH (ref 70–99)

## 2020-10-14 MED ORDER — HYDROCORTISONE 1 % EX CREA
1.0000 "application " | TOPICAL_CREAM | Freq: Three times a day (TID) | CUTANEOUS | Status: DC | PRN
  Administered 2020-10-14 – 2020-10-15 (×2): 1 via TOPICAL
  Filled 2020-10-14 (×2): qty 28

## 2020-10-14 NOTE — Progress Notes (Signed)
[facility] Progress Note  Patient: Christina French    IPJ:[id number]  DOA: 10/11/2020     Date of Service: the patient was seen and examined on 10/14/2020  Brief hospital course: Past medical history of anxiety, tremors, HTN, PTSD, on multiple psychotropic medication, type II DM, GERD.  Presents with complaints of recurrent dizziness and fall. Appears to have BPPV. Currently plan is continue to educate and treat with vestibular rehab.  Will require SNF.  Medically stable.  Assessment and Plan: 1.  BPPV. Recurrent dizziness and lightheadedness. Symptoms are still present but improving. Orthostatic vitals negative. MRI brain negative for acute stroke. PT OT recommends SNF. Patient's dizziness is multifactorial, associated with her hyponatremia, BPPV as well as multiple medication that shares dizziness as side effect. positive Dix-Hallpike for posterior canal BPPV on the left.  PT educated the patient with improved symptoms but still persistent. Currently on scheduled meclizine as well.  Although may not needed. Social worker consult for SNF. Medically stable.  Awaiting placement.  2. Right arm pain after fall Patient reported some right arm pain but x-rays are negative for any acute fracture.  3. Hyponatremia. Likely hypovolemic from poor p.o. intake. Although etiology not clear.  No history that can cause hypovolemia or dehydration. Sodium improved significantly with simple IV fluids.  4.  Depression, anxiety, essential tremor Patient is on multiple psychotropic medication. She is on Lexapro, Remeron, trazodone.  Along with that patient is also on Xanax and Vistaril. At present we will discontinue Vistaril.  Reduce the dose of the Remeron and trazodone as this can also contribute to patient's dizziness. Continue primidone for essential tremor.  5.  History of seizure disorder Continue current regimen.  Which includes Keppra, Vimpat. No evidence of seizures right now.  6.   HTN Blood pressure stable. Continuing current regimen.  7.  Type 2 diabetes mellitus uncontrolled with hyperglycemia with neuropathy complication without any long-term insulin use Continue sliding scale insulin. Holding home regimen. Continue gabapentin  8.  HLD. Continuing statin.  9.  Obesity. Potential OSA. Patient will benefit from outpatient sleep study. At risk for poor outcome. Body mass index is 38.99 kg/m.    Diet: Cardiac diet DVT Prophylaxis: Subcutaneous Lovenox    Advance goals of care discussion: Full code  Family Communication: no family was present at bedside, at the time of interview.   Disposition:  Status is: Observation  Dispo: The patient is from: Home              Anticipated d/c is to: SNF              Patient currently is medically stable to d/c.   Difficult to place patient No  Subjective: Still has some dizziness on left.  No nausea no vomiting.  Oral intake improving.  No chest pain.  No abdominal pain.  No diarrhea no constipation.  Physical Exam:  General: Appear in mild distress, no Rash; Oral Mucosa Clear, moist. no Abnormal Neck Mass Or lumps, Conjunctiva normal  Cardiovascular: S1 and S2 Present, no Murmur, Respiratory: good respiratory effort, Bilateral Air entry present and CTA, no Crackles, no wheezes Abdomen: Bowel Sound present, Soft and no tenderness Extremities: no Pedal edema Neurology: alert and oriented to time, place, and person affect appropriate. no new focal deficit Gait not checked due to patient safety concerns   Vitals:   10/14/20 0104 10/14/20 0325 10/14/20 0817 10/14/20 1229  BP: 120/80 (!) 151/97 120/74 138/88  Pulse: 78 80 76 82  Resp: 16 16 16 16   Temp: (!) 97.4 F (36.3 C) 97.9 F (36.6 C) 98.1 F (36.7 C) 98.6 F (37 C)  TempSrc:      SpO2: 99% 99% 95% 98%  Weight:      Height:        Intake/Output Summary (Last 24 hours) at 10/14/2020 1609 Last data filed at 10/14/2020 1035 Gross per 24 hour   Intake 480 ml  Output 1600 ml  Net -1120 ml    Filed Weights   10/11/20 1325  Weight: 93.6 kg    Data Reviewed: I have personally reviewed and interpreted daily labs, tele strips, imaging. I reviewed all nursing notes, pharmacy notes, vitals, pertinent old records I have discussed plan of care as described above with RN and patient/family.  CBC: Recent Labs  Lab 10/11/20 1341 10/12/20 0517 10/13/20 0520  WBC 15.1* 7.3 5.0  HGB 12.5 11.4* 11.5*  HCT 35.1* 33.7* 33.7*  MCV 76.8* 79.1* 79.1*  PLT 170 123* 117*    Basic Metabolic Panel: Recent Labs  Lab 10/11/20 1341 10/12/20 0517 10/12/20 1431 10/13/20 0520  NA 127* 129* 133* 136  K 3.7 3.2* 3.9 3.9  CL 92* 94* 99 102  CO2 24 28 29 29   GLUCOSE 171* 162* 123* 154*  BUN 15 12 9 8   CREATININE 1.01* 0.82 0.80 0.73  CALCIUM 8.8* 8.4* 8.4* 8.4*  MG  --   --  1.7  --      Studies: No results found.  Scheduled Meds:  aspirin EC  81 mg Oral Daily   atenolol  50 mg Oral BID   escitalopram  20 mg Oral Daily   ferrous sulfate  325 mg Oral BID   gabapentin  100 mg Oral BID   insulin aspart  0-15 Units Subcutaneous TID WC   insulin aspart  0-5 Units Subcutaneous QHS   lacosamide  150 mg Oral BID   levETIRAcetam  500 mg Oral BID   lisinopril  5 mg Oral Daily   meclizine  25 mg Oral TID   mirtazapine  15 mg Oral QHS   pantoprazole  40 mg Oral Daily   primidone  50 mg Oral Daily   rosuvastatin  5 mg Oral Daily   traZODone  100 mg Oral QHS   Continuous Infusions: PRN Meds: acetaminophen **OR** acetaminophen, ALPRAZolam, cyclobenzaprine, hydrocortisone cream, magnesium hydroxide, ondansetron **OR** ondansetron (ZOFRAN) IV, traZODone  Time spent: 35 minutes  Author: Berle Mull, MD [facility] 10/14/2020 4:09 PM  To reach On-call, see care teams to locate the attending and reach out via [email or web address]. Between 7PM-7AM, please contact night-coverage If you still have difficulty reaching the attending  provider, please page the [facility] (Director on Call) for [facility] on amion for assistance.

## 2020-10-14 NOTE — Plan of Care (Signed)
Pt orientedx4, VSS. NSR on telemetry. PRN tylenol, flexeril used for pain control, xanax given x2 for anxiety. Reported much better sleep compared to prior night, no episodes of dizziness or feeling like falling. Adequate UO, no BM this shift. Hydrocortisone cream ordered for itchiness/irritation to buttock d/t moisture. Fall/safety precautions in place, rounding performed, needs/concerns addressed.   Problem: Education: Goal: Knowledge of General Education information will improve Description: Including pain rating scale, medication(s)/side effects and non-pharmacologic comfort measures Outcome: Progressing   Problem: Health Behavior/Discharge Planning: Goal: Ability to manage health-related needs will improve Outcome: Progressing   Problem: Clinical Measurements: Goal: Ability to maintain clinical measurements within normal limits will improve Outcome: Progressing Goal: Will remain free from infection Outcome: Progressing Goal: Diagnostic test results will improve Outcome: Progressing Goal: Respiratory complications will improve Outcome: Progressing Goal: Cardiovascular complication will be avoided Outcome: Progressing   Problem: Activity: Goal: Risk for activity intolerance will decrease Outcome: Progressing   Problem: Nutrition: Goal: Adequate nutrition will be maintained Outcome: Progressing   Problem: Coping: Goal: Level of anxiety will decrease Outcome: Progressing   Problem: Elimination: Goal: Will not experience complications related to bowel motility Outcome: Progressing Goal: Will not experience complications related to urinary retention Outcome: Progressing   Problem: Pain Managment: Goal: General experience of comfort will improve Outcome: Progressing   Problem: Safety: Goal: Ability to remain free from injury will improve Outcome: Progressing   Problem: Skin Integrity: Goal: Risk for impaired skin integrity will decrease Outcome: Progressing

## 2020-10-14 NOTE — TOC Progression Note (Signed)
Transition of Care [facility]) - Progression Note    Patient Details  Name: MIEKO KNEEBONE MRN: [id number] Date of Birth: May 06, 1955  Transition of Care [facility] CM/SW [place], RN Phone Number: 10/14/2020, 10:27 AM  Clinical Narrative:  Patient lives at home alone and has assistance from daughter at times.  States she is amenable to SNF placement "because I have seizures and I am starting to have dementia".  Awaiting results of SNF bed offers at this time.         Expected Discharge Plan and Services                                                 Social Determinants of Health (SDOH) Interventions    Readmission Risk Interventions No flowsheet data found.

## 2020-10-14 NOTE — NC FL2 (Signed)
[place] LEVEL OF CARE SCREENING TOOL     IDENTIFICATION  Patient Name: Christina French Birthdate: 06-Oct-1955 Sex: female Admission Date (Current Location): 10/11/2020  [facility] and [place] Number:  Engineering geologist and Address:  [facility], [address] [place], [place] [address]      Provider Number: [id number]  Attending Physician Name and Address:  Lavina Hamman, MD  Relative Name and Phone Number:  Renaldo Fiddler (Daughter)   [phone] (Mobile)    Current Level of Care: Hospital Recommended Level of Care: [place] Prior Approval Number:    Date Approved/Denied:   PASRR Number: [id number]  Discharge Plan: SNF    Current Diagnoses: Patient Active Problem List   Diagnosis Date Noted   Vertigo 10/12/2020   Atrial fibrillation, unspecified type ([place]) 10/11/2020   Hyperlipidemia associated with type 2 diabetes mellitus ([place]) 02/02/2020   Uncontrolled type 2 diabetes mellitus with hyperglycemia ([place]) 01/31/2020   Encounter for general adult medical examination with abnormal findings 06/16/2019   Neuralgia and neuritis, unspecified 06/16/2019   Central sleep apnea due to medical condition 02/27/2019   Hematochezia 12/20/2018   Impaired fasting glucose 11/11/2017   Low back pain of thoracolumbar region with sciatica 11/11/2017   Severe anemia    GI bleed 04/25/2017   Symptomatic anemia 04/25/2017   Hyponatremia 04/25/2017   Diabetes ([place]) 04/25/2017   Rectal bleeding 08/03/2016   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   Anemia 04/16/2015   Anxiety disorder 03/20/2015   Acute hyperglycemia 03/20/2015   Pulse irregularity 03/20/2015   Dehydration 03/20/2015   Septic arthritis ([place])    Prosthetic hip infection ([place])    Staphylococcus aureus infection    Enteritis due to Clostridium difficile    Screening for breast cancer    Acute pain of right hip; hip infection 11/24/2014   Right hip pain  11/24/2014   Chronic hyponatremia 11/24/2014   Seizure disorder ([place]) 08/17/2013   Digestive disorder 08/17/2013   Essential hypertension 08/17/2013   Hyperlipidemia 08/17/2013   Seizure ([place]) 08/17/2013    Orientation RESPIRATION BLADDER Height & Weight     Self, Time, Situation, Place  Normal Continent, External catheter Weight: 93.6 kg Height:  5\' 1"  (154.9 cm)  BEHAVIORAL SYMPTOMS/MOOD NEUROLOGICAL BOWEL NUTRITION STATUS    Convulsions/Seizures (Patient has a history of seizures) Continent Diet (carb modified)  AMBULATORY STATUS COMMUNICATION OF NEEDS Skin   Limited Assist (Max to moderate assist for standing and gait) Verbally Skin abrasions (abrasion and excoriation to face, toe and elbow, r wrist, shin and hip bruise and edema, R breast fold excoriation)                       Personal Care Assistance Level of Assistance  Bathing, Feeding, Dressing Bathing Assistance: Limited assistance Feeding assistance: Independent Dressing Assistance: Limited assistance     Functional Limitations Info  Sight, Hearing, Speech Sight Info: Adequate Hearing Info: Adequate Speech Info: Adequate    SPECIAL CARE FACTORS FREQUENCY  PT (By licensed PT), OT (By licensed OT)     PT Frequency: 5x weekly or per facility standards OT Frequency: 5x weekly or per facility standards            Contractures Contractures Info: Not present    Additional Factors Info  Code Status, Allergies Code Status Info: FULL CODE Allergies Info: No Known Allergies           Current Medications (10/14/2020):  This is the current hospital active medication list Current Facility-Administered Medications  Medication Dose Route Frequency Provider Last Rate Last Admin   acetaminophen (TYLENOL) tablet 650 mg  650 mg Oral Q6H PRN Mansy, Jan A, MD   650 mg at 10/14/20 0451   Or   acetaminophen (TYLENOL) suppository 650 mg  650 mg Rectal Q6H PRN Mansy, Jan A, MD       ALPRAZolam Duanne Moron) tablet 1 mg  1  mg Oral TID PRN Mansy, Jan A, MD   1 mg at 10/14/20 0451   aspirin EC tablet 81 mg  81 mg Oral Daily Mansy, Jan A, MD   81 mg at 10/14/20 0903   atenolol (TENORMIN) tablet 50 mg  50 mg Oral BID Mansy, Jan A, MD   50 mg at 10/14/20 0902   cyclobenzaprine (FLEXERIL) tablet 10 mg  10 mg Oral TID PRN Lavina Hamman, MD   10 mg at 10/14/20 0451   escitalopram (LEXAPRO) tablet 20 mg  20 mg Oral Daily Mansy, Jan A, MD   20 mg at 10/14/20 0902   ferrous sulfate tablet 325 mg  325 mg Oral BID Mansy, Jan A, MD   325 mg at 10/14/20 4098   gabapentin (NEURONTIN) capsule 100 mg  100 mg Oral BID Mansy, Jan A, MD   100 mg at 10/14/20 1191   hydrocortisone cream 1 % 1 application  1 application Topical TID PRN Lavina Hamman, MD       insulin aspart (novoLOG) injection 0-15 Units  0-15 Units Subcutaneous TID WC Lavina Hamman, MD   2 Units at 10/14/20 0859   insulin aspart (novoLOG) injection 0-5 Units  0-5 Units Subcutaneous QHS Lavina Hamman, MD       lacosamide (VIMPAT) tablet 150 mg  150 mg Oral BID Mansy, Jan A, MD   150 mg at 10/14/20 0901   levETIRAcetam (KEPPRA) tablet 500 mg  500 mg Oral BID Mansy, Jan A, MD   500 mg at 10/14/20 0904   lisinopril (ZESTRIL) tablet 5 mg  5 mg Oral Daily Mansy, Jan A, MD   5 mg at 10/14/20 4782   magnesium hydroxide (MILK OF MAGNESIA) suspension 30 mL  30 mL Oral Daily PRN Mansy, Jan A, MD       meclizine (ANTIVERT) tablet 25 mg  25 mg Oral TID Lavina Hamman, MD   25 mg at 10/14/20 0903   mirtazapine (REMERON) tablet 15 mg  15 mg Oral QHS Lavina Hamman, MD   15 mg at 10/13/20 2140   ondansetron (ZOFRAN) tablet 4 mg  4 mg Oral Q6H PRN Mansy, Jan A, MD       Or   ondansetron [facility]) injection 4 mg  4 mg Intravenous Q6H PRN Mansy, Jan A, MD       pantoprazole (PROTONIX) EC tablet 40 mg  40 mg Oral Daily Mansy, Jan A, MD   40 mg at 10/14/20 0901   primidone (MYSOLINE) tablet 50 mg  50 mg Oral Daily Mansy, Jan A, MD   50 mg at 10/14/20 0902   rosuvastatin (CRESTOR)  tablet 5 mg  5 mg Oral Daily Mansy, Jan A, MD   5 mg at 10/14/20 0902   traZODone (DESYREL) tablet 100 mg  100 mg Oral QHS Lavina Hamman, MD   100 mg at 10/13/20 2140   traZODone (DESYREL) tablet 25 mg  25 mg Oral QHS PRN Mansy, Arvella Merles, MD   25 mg  at 10/13/20 0156     Discharge Medications: Please see discharge summary for a list of discharge medications.  Relevant Imaging Results:  Relevant Lab Results:   Additional Information [id number]  Pete Pelt, RN

## 2020-10-15 ENCOUNTER — Encounter: Payer: Self-pay | Admitting: Family Medicine

## 2020-10-15 DIAGNOSIS — H8112 Benign paroxysmal vertigo, left ear: Secondary | ICD-10-CM | POA: Diagnosis not present

## 2020-10-15 LAB — CBC
HCT: 36.1 % (ref 36.0–46.0)
Hemoglobin: 12.2 g/dL (ref 12.0–15.0)
MCH: 27.4 pg (ref 26.0–34.0)
MCHC: 33.8 g/dL (ref 30.0–36.0)
MCV: 80.9 fL (ref 80.0–100.0)
Platelets: 131 10*3/uL — ABNORMAL LOW (ref 150–400)
RBC: 4.46 MIL/uL (ref 3.87–5.11)
RDW: 13.3 % (ref 11.5–15.5)
WBC: 5.3 10*3/uL (ref 4.0–10.5)
nRBC: 0 % (ref 0.0–0.2)

## 2020-10-15 LAB — BASIC METABOLIC PANEL
Anion gap: 9 (ref 5–15)
BUN: 7 mg/dL — ABNORMAL LOW (ref 8–23)
CO2: 27 mmol/L (ref 22–32)
Calcium: 9.3 mg/dL (ref 8.9–10.3)
Chloride: 97 mmol/L — ABNORMAL LOW (ref 98–111)
Creatinine, Ser: 0.7 mg/dL (ref 0.44–1.00)
GFR, Estimated: >60 mL/min (ref >60)
Glucose, Bld: 150 mg/dL — ABNORMAL HIGH (ref 70–99)
Potassium: 4 mmol/L (ref 3.5–5.1)
Sodium: 133 mmol/L — ABNORMAL LOW (ref 135–145)

## 2020-10-15 LAB — GLUCOSE, CAPILLARY
Glucose-Capillary: 143 mg/dL — ABNORMAL HIGH (ref 70–99)
Glucose-Capillary: 165 mg/dL — ABNORMAL HIGH (ref 70–99)
Glucose-Capillary: 153 mg/dL — ABNORMAL HIGH (ref 70–99)
Glucose-Capillary: 157 mg/dL — ABNORMAL HIGH (ref 70–99)

## 2020-10-15 MED ORDER — ALPRAZOLAM 1 MG PO TABS: 1.0000 mg | ORAL_TABLET | Freq: Three times a day (TID) | ORAL | 3 refills | Status: DC | PRN

## 2020-10-15 MED ORDER — TRAZODONE HCL 100 MG PO TABS: 100.0000 mg | ORAL_TABLET | Freq: Every day | ORAL | 0 refills | Status: DC

## 2020-10-15 MED ORDER — MIRTAZAPINE 15 MG PO TABS: 15.0000 mg | ORAL_TABLET | Freq: Every day | ORAL | Status: DC

## 2020-10-15 NOTE — Discharge Summary (Addendum)
[facility] Discharge Summary   Patient: Christina French DDU:[id number]  PCP: [name], MD  MD   Date of admission: 10/11/2020             Date of discharge:  10/16/2020         Discharge Diagnoses:  Principal diagnosis BPPV.  Active Problems:   Atrial fibrillation, unspecified type (HCC)   Vertigo Recurrent fall. History of seizures HTN  Admitted From: Home Disposition:  SNF   Recommendations for Outpatient Follow-up:  PCP: Follow-up with PCP in 2 weeks   Follow-up Information     [name], MD. Schedule an appointment as soon as possible for a visit in 2 week(s).   Specialties: Internal Medicine, Cardiology Contact information: [place] [address] [phone]                Discharge Instructions     Diet - low sodium heart healthy   Complete by: As directed    Increase activity slowly   Complete by: As directed    No wound care   Complete by: As directed        Diet recommendation: Cardiac diet  Activity: The patient is advised to gradually reintroduce usual activities, as tolerated  Discharge Condition: stable  Code Status: Full code   History of present illness: As per the H and P dictated on admission, "Christina French is a 65 y.o. female with medical history significant for multiple medical problems that are mentioned below, who presented to the emergency room with acute onset of intractable dizziness with associated vertigo since yesterday.  The patient had subsequent 3 falls for which she called EMS each time.  After the fourth fall she was brought to the emergency room.  The patient denied any syncope or chest pain or palpitations.  No fever or chills.  No nausea or vomiting.  As a result of her falls she had bilateral elbow swelling and right shoulder pain.  No dysuria, oliguria or hematuria or flank pain.  She admitted to recent bronchitis about 3 weeks ago.  She has been mildly loose bowel movements.  No melena or  bright red bleeding per rectum.  ED Course: When she came to the ER heart rate was 101 with otherwise normal vital signs.  Labs revealed hyponatremia hypochloremia and leukocytosis 15.1.  Influenza antigen and COVID-19 PCR came back negative.  UA was positive for UTI. EKG as reviewed by me : EKG showed sinus rhythm with rate of 100 with first-degree AV block. Imaging: Noncontrast CT scan revealed air-fluid level within the sphenoid sinus. Abdominal pelvic CT scan showed colonic diverticulosis without diverticulitis, hepatic steatosis, mild splenomegaly, gallstone versus wall calcification in the gallbladder neck without findings of acute cholecystitis.  It showed small hiatal hernia and aortic atherosclerosis."   Addendum6/22/22 : no overnight issues. Pt without complaints. Stable for discharge.  Hospital Course:  Summary of her active problems in the hospital is as following. 1.  BPPV. Recurrent dizziness and lightheadedness. Symptoms are still present but improving. Orthostatic vitals negative. MRI brain negative for acute stroke. PT OT recommends SNF. Patient's dizziness is multifactorial, associated with her hyponatremia, BPPV as well as multiple medication that shares dizziness as side effect. positive Dix-Hallpike for posterior canal BPPV on the left. PT educated the patient with improved symptoms but still persistent. Currently on scheduled meclizine as well.  We will stop on discharge. Social worker consult for SNF. Medically stable.  Awaiting placement.  2. Right arm pain after fall Patient reported some right arm pain but x-rays are negative for any acute fracture.   3. Hyponatremia.  Resolved. Likely hypovolemic from poor p.o. intake. Although etiology not clear.  No history that can cause hypovolemia or dehydration. Sodium improved significantly with simple IV fluids.   4.  Depression, anxiety, essential tremor Patient is on multiple psychotropic medication. She is on  Lexapro, Remeron, trazodone.  Along with that patient is also on Xanax and Vistaril. At present we will discontinue Vistaril.  Reduce the dose of the Remeron and trazodone as this can also contribute to patient's dizziness. Continue primidone for essential tremor.   5.  History of seizure disorder Continue current regimen.  Which includes Keppra, Vimpat. No evidence of seizures right now.   6.  HTN Blood pressure stable. Continuing current regimen.   7.  Type 2 diabetes mellitus uncontrolled with hyperglycemia with neuropathy complication without any long-term insulin use Continue sliding scale insulin. Holding home regimen. Continue gabapentin   8.  HLD. Continuing statin.   9.  Obesity. Potential OSA. Patient will benefit from outpatient sleep study. At risk for poor outcome. Body mass index is 38.99 kg/m.   Patient was seen by physical therapy, who recommended SNF. On the day of the discharge the patient's vitals were stable, and no other new acute medical condition were reported. The patient was felt safe to be discharge at SNF with Therapy.  Consultants: none Procedures: none  DISCHARGE MEDICATION: Allergies as of 10/15/2020   No Known Allergies      Medication List     STOP taking these medications    hydrOXYzine 25 MG tablet Commonly known as: ATARAX/VISTARIL       TAKE these medications    Accu-Chek Guide test strip Generic drug: glucose blood Use as directed twice a strips twice a day E 11.65   Accu-Chek Softclix Lancets lancets Use as instructed twice a day Dx E11.65   acetaminophen 500 MG tablet Commonly known as: TYLENOL Take 1,000 mg by mouth 2 (two) times daily as needed for mild pain or headache.   ALPRAZolam 1 MG tablet Commonly known as: XANAX Take 1 tablet (1 mg total) by mouth 3 (three) times daily as needed for anxiety.   aspirin EC 81 MG tablet Take 81 mg by mouth daily.   atenolol 50 MG tablet Commonly known as:  TENORMIN TAKE 1 TABLET BY MOUTH TWICE A DAY   cyclobenzaprine 10 MG tablet Commonly known as: FLEXERIL Take 1 tablet (10 mg total) by mouth 3 (three) times daily as needed for muscle spasms.   Dexcom G6 Sensor Misc Use every 14 days dx e11.65   escitalopram 20 MG tablet Commonly known as: LEXAPRO Take 20 mg by mouth daily.   ferrous sulfate 325 (65 FE) MG EC tablet Take 1 tablet (325 mg total) by mouth 2 (two) times daily.   gabapentin 100 MG capsule Commonly known as: NEURONTIN TAKE ONE CAPSULE BY MOUTH TWICE A DAY   glimepiride 2 MG tablet Commonly known as: AMARYL Take 1 tablet (2 mg total) by mouth daily before breakfast.   Lacosamide 150 MG Tabs Take 150 mg by mouth 2 (two) times daily.   levETIRAcetam 500 MG tablet Commonly known as: KEPPRA Take 500 mg by mouth 2 (two) times daily.   lisinopril 5 MG tablet Commonly known as: ZESTRIL TAKE 1 TABLET BY MOUTH DAILY   metFORMIN 500 MG tablet Commonly known as: GLUCOPHAGE TAKE 1 TABLET BY  MOUTH TWICE A DAY WITH A MEAL What changed: See the new instructions.   mirtazapine 15 MG tablet Commonly known as: REMERON Take 1 tablet (15 mg total) by mouth at bedtime. What changed: how much to take   omeprazole 40 MG capsule Commonly known as: PRILOSEC TAKE ONE CAPSULE BY MOUTH TWICE A DAY   primidone 50 MG tablet Commonly known as: MYSOLINE Take 50 mg by mouth daily.   propranolol 10 MG tablet Commonly known as: INDERAL Take 10 mg by mouth.   rosuvastatin 5 MG tablet Commonly known as: CRESTOR TAKE 1 TABLET BY MOUTH DAILY   Rybelsus 3 MG Tabs Generic drug: Semaglutide Take 3 mg by mouth daily before breakfast. With no more than 4 oz of water. Wait 30 minutes before eating, drinking or taking the rest of your medications.   traZODone 100 MG tablet Commonly known as: DESYREL Take 1 tablet (100 mg total) by mouth at bedtime. What changed:  medication strength how much to take        Discharge  Exam: Filed Weights   10/11/20 1325  Weight: 93.6 kg   Vitals:   10/15/20 1137 10/15/20 1733  BP: (!) 137/95 133/90  Pulse: 80 79  Resp: 18 20  Temp: 97.9 F (36.6 C) 97.7 F (36.5 C)  SpO2: 93% 100%   General: Appear in mild distress, no Rash; Oral Mucosa Clear, moist. no Abnormal Neck Mass Or lumps, Conjunctiva normal  Cardiovascular: S1 and S2 Present, no Murmur, Respiratory: good respiratory effort, Bilateral Air entry present and CTA, no Crackles, no wheezes Abdomen: Bowel Sound present, Soft and no tenderness Extremities: no Pedal edema Neurology: alert and oriented to time, place, and person affect appropriate. no new focal deficit Gait not checked due to patient safety concerns  The results of significant diagnostics from this hospitalization (including imaging, microbiology, ancillary and laboratory) are listed below for reference.    Significant Diagnostic Studies: CT ABDOMEN PELVIS WO CONTRAST  Result Date: 10/11/2020 CLINICAL DATA:  Diverticulitis suspected Patient reports dizziness leading to fall.  Multiple falls today. EXAM: CT ABDOMEN AND PELVIS WITHOUT CONTRAST TECHNIQUE: Multidetector CT imaging of the abdomen and pelvis was performed following the standard protocol without IV contrast. COMPARISON:  Remote CT 12/04/2010 FINDINGS: Lower chest: Subsegmental atelectasis in the lung bases. Heart is normal in size. Hepatobiliary: Diffusely decreased hepatic density typical of steatosis. There is no evidence of focal lesion. Question of micro nodular hepatic contours. Small calcified gallstone versus wall calcification in the gallbladder neck. No pericholecystic inflammation. There is no biliary dilatation. Pancreas: Fatty atrophy.  No ductal dilatation or inflammation. Spleen: Mild splenomegaly with spleen measuring 14.0 x 7.4 x 8.9 cm (volume = 480 cm^3). No focal abnormality. No perisplenic fluid. Adrenals/Urinary Tract: Normal adrenal glands. There is no hydronephrosis or  perinephric edema. Slight lobulated renal contours. No renal calculi. No evidence of focal renal lesion. Decompressed ureters. Partially distended urinary bladder. Stomach/Bowel: Small hiatal hernia. Stomach otherwise physiologically distended. Decompressed small bowel without obstruction or inflammation. Appendix is not definitively visualized, no appendicitis. There is moderate volume of colonic stool. Mild diverticulosis without diverticulitis. Chain sutures noted in the distal sigmoid/rectum. There is a 2.6 cm segment of nondistended proximal sigmoid colon, that is surrounded by a stool, unclear if this represents a focal colonic narrowing or area of peristalsis. No associated fat stranding. Vascular/Lymphatic: Mild aortic atherosclerosis. No aortic aneurysm. No bulky abdominopelvic adenopathy. Reproductive: Hysterectomy without adnexal mass. Other: No ascites, free air, or focal fluid collection. No  body wall hernia. Musculoskeletal: Right hip replacement partially visualized. Mild scoliosis and degenerative change in the spine. There are no acute or suspicious osseous abnormalities. IMPRESSION: 1. Colonic diverticulosis without diverticulitis. There is a 2.6 cm segment of nondistended proximal sigmoid colon, that is surrounded by a stool. It is unclear if this represents a focal colonic narrowing or area of peristalsis. Recommend correlation with colonoscopy. 2. Hepatic steatosis. Question of micro nodular hepatic contours, can be seen with cirrhosis. Recommend correlation with cirrhosis risk factors. Mild splenomegaly. 3. Gallstone versus wall calcification in the gallbladder neck without CT findings of acute cholecystitis. 4. Small hiatal hernia. Aortic Atherosclerosis (ICD10-I70.0). Electronically Signed   By: Keith Rake M.D.   On: 10/11/2020 17:29   DG Shoulder Right  Result Date: 10/12/2020 CLINICAL DATA:  Right shoulder pain after fall EXAM: RIGHT SHOULDER - 2+ VIEW COMPARISON:  None. FINDINGS:  There is no evidence of fracture or dislocation. Mild arthropathy of the glenohumeral and acromioclavicular joints. Soft tissues are unremarkable. IMPRESSION: 1. No acute fracture or dislocation. 2. Mild degenerative changes of the right shoulder. Electronically Signed   By: Davina Poke D.O.   On: 10/12/2020 12:55   DG Elbow 2 Views Right  Result Date: 10/12/2020 CLINICAL DATA:  Right elbow pain after fall EXAM: RIGHT ELBOW - 2 VIEW COMPARISON:  None. FINDINGS: There is no evidence of fracture, dislocation, or joint effusion. There is no evidence of arthropathy or other focal bone abnormality. Mild soft tissue edema about the elbow. IV catheter is noted. IMPRESSION: Negative. Electronically Signed   By: Davina Poke D.O.   On: 10/12/2020 12:58   DG Wrist 2 Views Right  Result Date: 10/12/2020 CLINICAL DATA:  Right wrist pain after fall EXAM: RIGHT WRIST - 2 VIEW COMPARISON:  09/30/2016 FINDINGS: There is no evidence of fracture or dislocation. There is no evidence of arthropathy or other focal bone abnormality. Soft tissue swelling at the ulnar aspect of the distal forearm and wrist. IMPRESSION: Soft tissue swelling without acute fracture or dislocation of the right wrist. Electronically Signed   By: Davina Poke D.O.   On: 10/12/2020 12:56   CT Head Wo Contrast  Result Date: 10/11/2020 CLINICAL DATA:  Acute neuro deficit.  Fall, dizziness EXAM: CT HEAD WITHOUT CONTRAST TECHNIQUE: Contiguous axial images were obtained from the base of the skull through the vertex without intravenous contrast. COMPARISON:  MRI head 01/20/2019 FINDINGS: Brain: No evidence of acute infarction, hemorrhage, hydrocephalus, extra-axial collection or mass lesion/mass effect. Vascular: Negative for hyperdense vessel Skull: Negative Sinuses/Orbits: Air-fluid level sphenoid sinus. Remaining sinuses clear. Negative orbit Other: None IMPRESSION: No acute abnormality Air-fluid level sphenoid sinus. Electronically Signed    By: Franchot Gallo M.D.   On: 10/11/2020 17:16   MR BRAIN WO CONTRAST  Result Date: 10/11/2020 CLINICAL DATA:  Fall with dizziness EXAM: MRI HEAD WITHOUT CONTRAST TECHNIQUE: Multiplanar, multiecho pulse sequences of the brain and surrounding structures were obtained without intravenous contrast. COMPARISON:  01/20/2019 FINDINGS: Brain: No acute infarct, mass effect or extra-axial collection. No acute or chronic hemorrhage. Minimal multifocal hyperintense T2-weight signal within the white matter. The midline structures are normal. Vascular: Major flow voids are preserved. Skull and upper cervical spine: Normal calvarium and skull base. Visualized upper cervical spine and soft tissues are normal. Sinuses/Orbits:No paranasal sinus fluid levels or advanced mucosal thickening. No mastoid or middle ear effusion. Normal orbits. IMPRESSION: No acute intracranial abnormality. Electronically Signed   By: Ulyses Jarred M.D.   On: 10/11/2020 22:08  Microbiology: Recent Results (from the past 240 hour(s))  Resp Panel by RT-PCR (Flu A&B, Covid) Nasopharyngeal Swab     Status: None   Collection Time: 10/12/20 12:31 AM   Specimen: Nasopharyngeal Swab; Nasopharyngeal(NP) swabs in vial transport medium  Result Value Ref Range Status   SARS Coronavirus 2 by RT PCR NEGATIVE NEGATIVE Final    Comment: (NOTE) SARS-CoV-2 target nucleic acids are NOT DETECTED.  The SARS-CoV-2 RNA is generally detectable in upper respiratory specimens during the acute phase of infection. The lowest concentration of SARS-CoV-2 viral copies this assay can detect is 138 copies/mL. A negative result does not preclude SARS-Cov-2 infection and should not be used as the sole basis for treatment or other patient management decisions. A negative result may occur with  improper specimen collection/handling, submission of specimen other than nasopharyngeal swab, presence of viral mutation(s) within the areas targeted by this assay, and  inadequate number of viral copies(<138 copies/mL). A negative result must be combined with clinical observations, patient history, and epidemiological information. The expected result is Negative.  Fact Sheet for Patients:  EntrepreneurPulse.com.au  Fact Sheet for Healthcare Providers:  IncredibleEmployment.be  This test is no t yet approved or cleared by the [place] FDA and  has been authorized for detection and/or diagnosis of SARS-CoV-2 by FDA under an Emergency Use Authorization (EUA). This EUA will remain  in effect (meaning this test can be used) for the duration of the COVID-19 declaration under Section 564(b)(1) of the Act, 21 U.S.C.section 360bbb-3(b)(1), unless the authorization is terminated  or revoked sooner.       Influenza A by PCR NEGATIVE NEGATIVE Final   Influenza B by PCR NEGATIVE NEGATIVE Final    Comment: (NOTE) The Xpert Xpress SARS-CoV-2/FLU/RSV plus assay is intended as an aid in the diagnosis of influenza from Nasopharyngeal swab specimens and should not be used as a sole basis for treatment. Nasal washings and aspirates are unacceptable for Xpert Xpress SARS-CoV-2/FLU/RSV testing.  Fact Sheet for Patients: EntrepreneurPulse.com.au  Fact Sheet for Healthcare Providers: IncredibleEmployment.be  This test is not yet approved or cleared by the [place] FDA and has been authorized for detection and/or diagnosis of SARS-CoV-2 by FDA under an Emergency Use Authorization (EUA). This EUA will remain in effect (meaning this test can be used) for the duration of the COVID-19 declaration under Section 564(b)(1) of the Act, 21 U.S.C. section 360bbb-3(b)(1), unless the authorization is terminated or revoked.  Performed at [facility], [address] [place], [place] [address]      Labs: CBC: Recent Labs  Lab 10/11/20 1341 10/12/20 0517 10/13/20 0520  10/15/20 0411  WBC 15.1* 7.3 5.0 5.3  HGB 12.5 11.4* 11.5* 12.2  HCT 35.1* 33.7* 33.7* 36.1  MCV 76.8* 79.1* 79.1* 80.9  PLT 170 123* 117* 128*   Basic Metabolic Panel: Recent Labs  Lab 10/11/20 1341 10/12/20 0517 10/12/20 1431 10/13/20 0520 10/15/20 0411  NA 127* 129* 133* 136 133*  K 3.7 3.2* 3.9 3.9 4.0  CL 92* 94* 99 102 97*  CO2 24 28 29 29 27   GLUCOSE 171* 162* 123* 154* 150*  BUN 15 12 9 8  7*  CREATININE 1.01* 0.82 0.80 0.73 0.70  CALCIUM 8.8* 8.4* 8.4* 8.4* 9.3  MG  --   --  1.7  --   --    Liver Function Tests: No results for input(s): AST, ALT, ALKPHOS, BILITOT, PROT, ALBUMIN in the last 168 hours. CBG: Recent Labs  Lab 10/14/20 1648 10/14/20 2133  10/15/20 0735 10/15/20 1138 10/15/20 1732  GLUCAP 161* 158* 143* 165* 153*    Time spent: 35 minutes  Signed:  Berle Mull  [facility]  10/15/2020 7:17 PM

## 2020-10-15 NOTE — Progress Notes (Signed)
Ok to [employer] tele per Dr Posey Pronto

## 2020-10-15 NOTE — Plan of Care (Signed)
No acute events overnight. Pt orientedx4, VSS, NSR on telemetry. Adequate UO, no BM this shift. Fall/safety precautions in place, rounding performed, needs/concerns addressed.   Problem: Education: Goal: Knowledge of General Education information will improve Description: Including pain rating scale, medication(s)/side effects and non-pharmacologic comfort measures Outcome: Progressing   Problem: Health Behavior/Discharge Planning: Goal: Ability to manage health-related needs will improve Outcome: Progressing   Problem: Clinical Measurements: Goal: Ability to maintain clinical measurements within normal limits will improve Outcome: Progressing Goal: Will remain free from infection Outcome: Progressing Goal: Diagnostic test results will improve Outcome: Progressing Goal: Respiratory complications will improve Outcome: Progressing Goal: Cardiovascular complication will be avoided Outcome: Progressing   Problem: Nutrition: Goal: Adequate nutrition will be maintained Outcome: Progressing   Problem: Coping: Goal: Level of anxiety will decrease Outcome: Progressing   Problem: Elimination: Goal: Will not experience complications related to bowel motility Outcome: Progressing Goal: Will not experience complications related to urinary retention Outcome: Progressing   Problem: Pain Managment: Goal: General experience of comfort will improve Outcome: Progressing

## 2020-10-15 NOTE — TOC Progression Note (Signed)
Transition of Care [facility]) - Progression Note    Patient Details  Name: Christina French MRN: [id number] Date of Birth: [birth year]  Transition of Care [facility]) CM/SW [place], RN Phone Number: 10/15/2020, 1:11 PM  Clinical Narrative:   Patent chose [place], Per Magda Paganini patient can go tomorrow.  Patient and care team aware.         Expected Discharge Plan and Services                                                 Social Determinants of Health (SDOH) Interventions    Readmission Risk Interventions No flowsheet data found.

## 2020-10-16 DIAGNOSIS — H8112 Benign paroxysmal vertigo, left ear: Secondary | ICD-10-CM | POA: Diagnosis not present

## 2020-10-16 LAB — GLUCOSE, CAPILLARY
Glucose-Capillary: 158 mg/dL — ABNORMAL HIGH (ref 70–99)
Glucose-Capillary: 192 mg/dL — ABNORMAL HIGH (ref 70–99)
Glucose-Capillary: 142 mg/dL — ABNORMAL HIGH (ref 70–99)
Glucose-Capillary: 148 mg/dL — ABNORMAL HIGH (ref 70–99)

## 2020-10-16 LAB — SARS CORONAVIRUS 2 (TAT 6-24 HRS): SARS Coronavirus 2: NEGATIVE

## 2020-10-16 NOTE — TOC Progression Note (Signed)
Transition of Care [facility]) - Progression Note    Patient Details  Name: Christina French MRN: [id number] Date of Birth: [birth year]  Transition of Care [facility]) CM/SW [place], RN Phone Number: 10/16/2020, 10:34 AM  Clinical Narrative:   Ezzard Flax called and stated approval will be sent to medical director.  Magda Paganini at [facility] aware, and care team and patient aware.  TOC to follow         Expected Discharge Plan and Services           Expected Discharge Date: 10/16/20                                     Social Determinants of Health (SDOH) Interventions    Readmission Risk Interventions No flowsheet data found.

## 2020-10-16 NOTE — Progress Notes (Signed)
Physical Therapy Treatment Patient Details Name: Christina French MRN: [id number] DOB: 1956/01/12 Today's Date: 10/16/2020    History of Present Illness Pt is a 65 y/o F admitted on 10/11/20 with c/c of dizziness & 3 falls. Pt being treated for intractable dizziness & vertigo, likely BPPV. PMH: anxiety, arthritis, skin CA, depression, DM, GERD, HTN, IBS, PTSD, sizures, vertigo    PT Comments    Patient alert, in bed, agreeable to PT. Pt did demonstrate improvement in overall mobility this session. Vestibular intervention performed (L dix hallpike and eply x1 performed). Nystagmus noted on first maneuver, decreased symptoms and decreased nystagmus as well. She was able to ambulate >34ft with RW, did exhibit decreased cadence and velocity. Pt stated it was better than last session. Recommendation remains appropriate due to decline in functional mobility (such as SPC to reliant on RW currently), and decreased caregiver support due to continued dizziness complaints.      Follow Up Recommendations  SNF;Supervision/Assistance - 24 hour     Equipment Recommendations  Other (comment) (TBD at next level of care)    Recommendations for Other Services       Precautions / Restrictions Precautions Precautions: Fall Restrictions Weight Bearing Restrictions: No    Mobility  Bed Mobility Overal bed mobility: Needs Assistance Bed Mobility: Rolling;Supine to Sit;Sit to Supine Rolling: Supervision   Supine to sit: HOB elevated;Min guard Sit to supine: Mod assist   General bed mobility comments: modA from bed due to vestibular interventions    Transfers Overall transfer level: Needs assistance Equipment used: Rolling walker (2 wheeled) Transfers: Sit to/from Stand Sit to Stand: Min guard            Ambulation/Gait Ambulation/Gait assistance: Min guard Gait Distance (Feet): 100 Feet Assistive device: Rolling walker (2 wheeled)   Gait velocity: decreased   General Gait Details: slow  cadence, but no LOB noted. reliant on RW   Stairs             Wheelchair Mobility    Modified Rankin (Stroke Patients Only)       Balance Overall balance assessment: Needs assistance Sitting-balance support: Feet supported;No upper extremity supported Sitting balance-Leahy Scale: Good     Standing balance support: During functional activity;Bilateral upper extremity supported Standing balance-Leahy Scale: Fair Standing balance comment: reliant on UE support                            Cognition Arousal/Alertness: Awake/alert Behavior During Therapy: WFL for tasks assessed/performed Overall Cognitive Status: Within Functional Limits for tasks assessed                                        Exercises Other Exercises Other Exercises: L dix hallpike and eply x2    General Comments        Pertinent Vitals/Pain Pain Assessment: No/denies pain    Home Living                      Prior Function            PT Goals (current goals can now be found in the care plan section) Progress towards PT goals: Progressing toward goals    Frequency    Min 2X/week      PT Plan Current plan remains appropriate    Co-evaluation  AM-PAC PT "6 Clicks" Mobility   Outcome Measure  Help needed turning from your back to your side while in a flat bed without using bedrails?: A Little Help needed moving from lying on your back to sitting on the side of a flat bed without using bedrails?: A Little Help needed moving to and from a bed to a chair (including a wheelchair)?: A Little Help needed standing up from a chair using your arms (e.g., wheelchair or bedside chair)?: A Little Help needed to walk in hospital room?: A Little Help needed climbing 3-5 steps with a railing? : A Little 6 Click Score: 18    End of Session Equipment Utilized During Treatment: Gait belt Activity Tolerance: Patient tolerated treatment  well Patient left: in chair;with call bell/phone within reach;with chair alarm set Nurse Communication: Mobility status PT Visit Diagnosis: Unsteadiness on feet (R26.81);Difficulty in walking, not elsewhere classified (R26.2);Muscle weakness (generalized) (M62.81);Pain;BPPV BPPV - Right/Left : Left Pain - Right/Left: Right Pain - part of body: Shoulder     Time: 9147-8295 PT Time Calculation (min) (ACUTE ONLY): 42 min  Charges:  $Therapeutic Activity: 8-22 mins $[place] Rep Proc: 23-37 mins                    Lieutenant Diego PT, DPT 4:06 PM,10/16/20

## 2020-10-17 DIAGNOSIS — G250 Essential tremor: Secondary | ICD-10-CM | POA: Diagnosis not present

## 2020-10-17 DIAGNOSIS — E1169 Type 2 diabetes mellitus with other specified complication: Secondary | ICD-10-CM | POA: Diagnosis not present

## 2020-10-17 DIAGNOSIS — R569 Unspecified convulsions: Secondary | ICD-10-CM | POA: Diagnosis not present

## 2020-10-17 DIAGNOSIS — R279 Unspecified lack of coordination: Secondary | ICD-10-CM | POA: Diagnosis not present

## 2020-10-17 DIAGNOSIS — Z79899 Other long term (current) drug therapy: Secondary | ICD-10-CM | POA: Diagnosis not present

## 2020-10-17 DIAGNOSIS — K440 Diaphragmatic hernia with obstruction, without gangrene: Secondary | ICD-10-CM | POA: Diagnosis not present

## 2020-10-17 DIAGNOSIS — M62838 Other muscle spasm: Secondary | ICD-10-CM | POA: Diagnosis not present

## 2020-10-17 DIAGNOSIS — K219 Gastro-esophageal reflux disease without esophagitis: Secondary | ICD-10-CM | POA: Diagnosis not present

## 2020-10-17 DIAGNOSIS — Z7982 Long term (current) use of aspirin: Secondary | ICD-10-CM | POA: Diagnosis not present

## 2020-10-17 DIAGNOSIS — E782 Mixed hyperlipidemia: Secondary | ICD-10-CM | POA: Diagnosis not present

## 2020-10-17 DIAGNOSIS — Z9181 History of falling: Secondary | ICD-10-CM | POA: Diagnosis not present

## 2020-10-17 DIAGNOSIS — H8112 Benign paroxysmal vertigo, left ear: Secondary | ICD-10-CM | POA: Diagnosis not present

## 2020-10-17 DIAGNOSIS — K580 Irritable bowel syndrome with diarrhea: Secondary | ICD-10-CM | POA: Diagnosis not present

## 2020-10-17 DIAGNOSIS — R5381 Other malaise: Secondary | ICD-10-CM | POA: Diagnosis not present

## 2020-10-17 DIAGNOSIS — M1990 Unspecified osteoarthritis, unspecified site: Secondary | ICD-10-CM | POA: Diagnosis not present

## 2020-10-17 DIAGNOSIS — B379 Candidiasis, unspecified: Secondary | ICD-10-CM | POA: Diagnosis not present

## 2020-10-17 DIAGNOSIS — R1032 Left lower quadrant pain: Secondary | ICD-10-CM | POA: Diagnosis not present

## 2020-10-17 DIAGNOSIS — R296 Repeated falls: Secondary | ICD-10-CM | POA: Diagnosis not present

## 2020-10-17 DIAGNOSIS — Z85828 Personal history of other malignant neoplasm of skin: Secondary | ICD-10-CM | POA: Diagnosis not present

## 2020-10-17 DIAGNOSIS — Z23 Encounter for immunization: Secondary | ICD-10-CM | POA: Diagnosis not present

## 2020-10-17 DIAGNOSIS — I440 Atrioventricular block, first degree: Secondary | ICD-10-CM | POA: Diagnosis not present

## 2020-10-17 DIAGNOSIS — I4891 Unspecified atrial fibrillation: Secondary | ICD-10-CM | POA: Diagnosis not present

## 2020-10-17 DIAGNOSIS — E1140 Type 2 diabetes mellitus with diabetic neuropathy, unspecified: Secondary | ICD-10-CM | POA: Diagnosis not present

## 2020-10-17 DIAGNOSIS — Z96641 Presence of right artificial hip joint: Secondary | ICD-10-CM | POA: Diagnosis not present

## 2020-10-17 DIAGNOSIS — I10 Essential (primary) hypertension: Secondary | ICD-10-CM | POA: Diagnosis not present

## 2020-10-17 DIAGNOSIS — D509 Iron deficiency anemia, unspecified: Secondary | ICD-10-CM | POA: Diagnosis not present

## 2020-10-17 DIAGNOSIS — R42 Dizziness and giddiness: Secondary | ICD-10-CM

## 2020-10-17 DIAGNOSIS — E871 Hypo-osmolality and hyponatremia: Secondary | ICD-10-CM | POA: Diagnosis not present

## 2020-10-17 DIAGNOSIS — E119 Type 2 diabetes mellitus without complications: Secondary | ICD-10-CM | POA: Diagnosis not present

## 2020-10-17 DIAGNOSIS — M6281 Muscle weakness (generalized): Secondary | ICD-10-CM | POA: Diagnosis not present

## 2020-10-17 DIAGNOSIS — N390 Urinary tract infection, site not specified: Secondary | ICD-10-CM | POA: Diagnosis not present

## 2020-10-17 DIAGNOSIS — K5790 Diverticulosis of intestine, part unspecified, without perforation or abscess without bleeding: Secondary | ICD-10-CM | POA: Diagnosis not present

## 2020-10-17 DIAGNOSIS — K2270 Barrett's esophagus without dysplasia: Secondary | ICD-10-CM | POA: Diagnosis not present

## 2020-10-17 DIAGNOSIS — Z20822 Contact with and (suspected) exposure to covid-19: Secondary | ICD-10-CM | POA: Diagnosis not present

## 2020-10-17 DIAGNOSIS — M79601 Pain in right arm: Secondary | ICD-10-CM | POA: Diagnosis not present

## 2020-10-17 DIAGNOSIS — Z7984 Long term (current) use of oral hypoglycemic drugs: Secondary | ICD-10-CM | POA: Diagnosis not present

## 2020-10-17 LAB — GLUCOSE, CAPILLARY
Glucose-Capillary: 169 mg/dL — ABNORMAL HIGH (ref 70–99)
Glucose-Capillary: 214 mg/dL — ABNORMAL HIGH (ref 70–99)

## 2020-10-17 NOTE — TOC Progression Note (Signed)
Transition of Care [facility]) - Progression Note    Patient Details  Name: Christina French MRN: [id number] Date of Birth: 01-02-1956  Transition of Care [facility]) CM/SW [place], RN Phone Number: 10/17/2020, 9:59 AM  Clinical Narrative:   As per [facility], patient is approved for SNF Auth # [id number]  [place] [id number] Patient to go to [facility] today, room 509 per [place].  Patient will be transported via Hospital doctor.  Patient aware of transfer, and is amenable.         Expected Discharge Plan and Services           Expected Discharge Date: 10/16/20                                     Social Determinants of Health (SDOH) Interventions    Readmission Risk Interventions No flowsheet data found.

## 2020-10-17 NOTE — Progress Notes (Signed)
Report called to [facility] spoke with assigned RN, Caron Presume. All questions addressed at time of call.

## 2020-10-17 NOTE — Discharge Summary (Signed)
Christina French DOB: Mar 31, 1956 DOA: 10/11/2020  PCP: [name], MD  Admit date: 10/11/2020 Discharge date: 10/17/2020  Admitted From: home Disposition:  SNF  Recommendations for Outpatient Follow-up:  Follow up with PCP in 1 week Please obtain BMP/CBC in one week     Discharge Condition:Stable CODE STATUS:full  Diet recommendation:Carb Modified / Brief/Interim Summary: Per HPI: Christina French is a 65 y.o. female with medical history significant for multiple medical problems that are mentioned below, who presented to the emergency room with acute onset of intractable dizziness with associated vertigo since yesterday.  The patient had subsequent 3 falls for which she called EMS each time.  After the fourth fall she was brought to the emergency room.  The patient denied any syncope or chest pain or palpitations.  No fever or chills.  No nausea or vomiting.  As a result of her falls she had bilateral elbow swelling and right shoulder pain.  No dysuria, oliguria or hematuria or flank pain.  She admitted to recent bronchitis about 3 weeks ago.  She has been mildly loose bowel movements.  No melena or bright red bleeding per rectum.  ED Course: When she came to the ER heart rate was 101 with otherwise normal vital signs.  Labs revealed hyponatremia hypochloremia and leukocytosis 15.1.  Influenza antigen and COVID-19 PCR came back negative.  UA was positive for UTI. EKG as reviewed by me : EKG showed sinus rhythm with rate of 100 with first-degree AV block. Imaging: Noncontrast CT scan revealed air-fluid level within the sphenoid sinus. Abdominal pelvic CT scan showed colonic diverticulosis without diverticulitis, hepatic steatosis, mild splenomegaly, gallstone versus wall calcification in the gallbladder neck without findings of acute cholecystitis.  It showed small hiatal hernia and aortic atherosclerosis.   Hospital Course: Summary of her active problems in the hospital is as  following. 1.  BPPV. Recurrent dizziness and lightheadedness. Symptoms are still present but improving. Orthostatic vitals negative. MRI brain negative for acute stroke. PT OT recommends SNF. Patient's dizziness is multifactorial, associated with her hyponatremia, BPPV as well as multiple medication that shares dizziness as side effect. positive Dix-Hallpike for posterior canal BPPV on the left. PT educated the patient with improved symptoms but still persistent. Currently on scheduled meclizine as well.  We will stop on discharge. Social worker consult for SNF. Medically stable.  Awaiting placement.   2. Right arm pain after fall Patient reported some right arm pain but x-rays are negative for any acute fracture.   3. Hyponatremia.  Resolved. Likely hypovolemic from poor p.o. intake. Although etiology not clear.  No history that can cause hypovolemia or dehydration. Sodium improved significantly with simple IV fluids.   4.  Depression, anxiety, essential tremor Patient is on multiple psychotropic medication. She is on Lexapro, Remeron, trazodone.  Along with that patient is also on Xanax and Vistaril. At present we will discontinue Vistaril.  Reduce the dose of the Remeron and trazodone as this can also contribute to patient's dizziness. Continue primidone for essential tremor.   5.  History of seizure disorder Continue current regimen.  Which includes Keppra, Vimpat. No evidence of seizures right now.   6.  HTN Blood pressure stable. Continuing current regimen.   7.  Type 2 diabetes mellitus uncontrolled with hyperglycemia with neuropathy complication without any long-term insulin use Continue sliding scale insulin. Holding home regimen. Continue gabapentin   8.  HLD. Continuing statin.   9.  Obesity. Potential OSA. Patient will benefit from  outpatient sleep study. At risk for poor outcome. Body mass index is 38.99 kg/m.        Discharge Diagnoses:  Active  Problems:   Atrial fibrillation, unspecified type [facility])   Vertigo    Discharge Instructions  Discharge Instructions     Diet - low sodium heart healthy   Complete by: As directed    Diet - low sodium heart healthy   Complete by: As directed    Diet Carb Modified   Complete by: As directed    Discharge wound care:   Complete by: As directed    As above   Increase activity slowly   Complete by: As directed    Increase activity slowly   Complete by: As directed    No wound care   Complete by: As directed       Allergies as of 10/17/2020   No Known Allergies      Medication List     STOP taking these medications    hydrOXYzine 25 MG tablet Commonly known as: ATARAX/VISTARIL       TAKE these medications    Accu-Chek Guide test strip Generic drug: glucose blood Use as directed twice a strips twice a day E 11.65   Accu-Chek Softclix Lancets lancets Use as instructed twice a day Dx E11.65   acetaminophen 500 MG tablet Commonly known as: TYLENOL Take 1,000 mg by mouth 2 (two) times daily as needed for mild pain or headache.   ALPRAZolam 1 MG tablet Commonly known as: XANAX Take 1 tablet (1 mg total) by mouth 3 (three) times daily as needed for anxiety.   aspirin EC 81 MG tablet Take 81 mg by mouth daily.   atenolol 50 MG tablet Commonly known as: TENORMIN TAKE 1 TABLET BY MOUTH TWICE A DAY   cyclobenzaprine 10 MG tablet Commonly known as: FLEXERIL Take 1 tablet (10 mg total) by mouth 3 (three) times daily as needed for muscle spasms.   Dexcom G6 Sensor Misc Use every 14 days dx e11.65   escitalopram 20 MG tablet Commonly known as: LEXAPRO Take 20 mg by mouth daily.   ferrous sulfate 325 (65 FE) MG EC tablet Take 1 tablet (325 mg total) by mouth 2 (two) times daily.   gabapentin 100 MG capsule Commonly known as: NEURONTIN TAKE ONE CAPSULE BY MOUTH TWICE A DAY   glimepiride 2 MG tablet Commonly known as: AMARYL Take 1 tablet (2 mg total) by  mouth daily before breakfast.   Lacosamide 150 MG Tabs Take 150 mg by mouth 2 (two) times daily.   levETIRAcetam 500 MG tablet Commonly known as: KEPPRA Take 500 mg by mouth 2 (two) times daily.   lisinopril 5 MG tablet Commonly known as: ZESTRIL TAKE 1 TABLET BY MOUTH DAILY   metFORMIN 500 MG tablet Commonly known as: GLUCOPHAGE TAKE 1 TABLET BY MOUTH TWICE A DAY WITH A MEAL What changed: See the new instructions.   mirtazapine 15 MG tablet Commonly known as: REMERON Take 1 tablet (15 mg total) by mouth at bedtime. What changed: how much to take   omeprazole 40 MG capsule Commonly known as: PRILOSEC TAKE ONE CAPSULE BY MOUTH TWICE A DAY   primidone 50 MG tablet Commonly known as: MYSOLINE Take 50 mg by mouth daily.   propranolol 10 MG tablet Commonly known as: INDERAL Take 10 mg by mouth.   rosuvastatin 5 MG tablet Commonly known as: CRESTOR TAKE 1 TABLET BY MOUTH DAILY   Rybelsus 3 MG Tabs  Generic drug: Semaglutide Take 3 mg by mouth daily before breakfast. With no more than 4 oz of water. Wait 30 minutes before eating, drinking or taking the rest of your medications.   traZODone 100 MG tablet Commonly known as: DESYREL Take 1 tablet (100 mg total) by mouth at bedtime. What changed:  medication strength how much to take               Discharge Care Instructions  (From admission, onward)           Start     Ordered   10/16/20 0000  Discharge wound care:       Comments: As above   10/16/20 1004            Follow-up Information     [name], MD. Schedule an appointment as soon as possible for a visit in 2 week(s).   Specialties: Internal Medicine, Cardiology Contact information: [place] [address] [phone]                No Known Allergies  Consultations:    Procedures/Studies: CT ABDOMEN PELVIS WO CONTRAST  Result Date: 10/11/2020 CLINICAL DATA:  Diverticulitis suspected Patient reports  dizziness leading to fall.  Multiple falls today. EXAM: CT ABDOMEN AND PELVIS WITHOUT CONTRAST TECHNIQUE: Multidetector CT imaging of the abdomen and pelvis was performed following the standard protocol without IV contrast. COMPARISON:  Remote CT 12/04/2010 FINDINGS: Lower chest: Subsegmental atelectasis in the lung bases. Heart is normal in size. Hepatobiliary: Diffusely decreased hepatic density typical of steatosis. There is no evidence of focal lesion. Question of micro nodular hepatic contours. Small calcified gallstone versus wall calcification in the gallbladder neck. No pericholecystic inflammation. There is no biliary dilatation. Pancreas: Fatty atrophy.  No ductal dilatation or inflammation. Spleen: Mild splenomegaly with spleen measuring 14.0 x 7.4 x 8.9 cm (volume = 480 cm^3). No focal abnormality. No perisplenic fluid. Adrenals/Urinary Tract: Normal adrenal glands. There is no hydronephrosis or perinephric edema. Slight lobulated renal contours. No renal calculi. No evidence of focal renal lesion. Decompressed ureters. Partially distended urinary bladder. Stomach/Bowel: Small hiatal hernia. Stomach otherwise physiologically distended. Decompressed small bowel without obstruction or inflammation. Appendix is not definitively visualized, no appendicitis. There is moderate volume of colonic stool. Mild diverticulosis without diverticulitis. Chain sutures noted in the distal sigmoid/rectum. There is a 2.6 cm segment of nondistended proximal sigmoid colon, that is surrounded by a stool, unclear if this represents a focal colonic narrowing or area of peristalsis. No associated fat stranding. Vascular/Lymphatic: Mild aortic atherosclerosis. No aortic aneurysm. No bulky abdominopelvic adenopathy. Reproductive: Hysterectomy without adnexal mass. Other: No ascites, free air, or focal fluid collection. No body wall hernia. Musculoskeletal: Right hip replacement partially visualized. Mild scoliosis and degenerative  change in the spine. There are no acute or suspicious osseous abnormalities. IMPRESSION: 1. Colonic diverticulosis without diverticulitis. There is a 2.6 cm segment of nondistended proximal sigmoid colon, that is surrounded by a stool. It is unclear if this represents a focal colonic narrowing or area of peristalsis. Recommend correlation with colonoscopy. 2. Hepatic steatosis. Question of micro nodular hepatic contours, can be seen with cirrhosis. Recommend correlation with cirrhosis risk factors. Mild splenomegaly. 3. Gallstone versus wall calcification in the gallbladder neck without CT findings of acute cholecystitis. 4. Small hiatal hernia. Aortic Atherosclerosis (ICD10-I70.0). Electronically Signed   By: Keith Rake M.D.   On: 10/11/2020 17:29   DG Shoulder Right  Result Date: 10/12/2020 CLINICAL DATA:  Right shoulder  pain after fall EXAM: RIGHT SHOULDER - 2+ VIEW COMPARISON:  None. FINDINGS: There is no evidence of fracture or dislocation. Mild arthropathy of the glenohumeral and acromioclavicular joints. Soft tissues are unremarkable. IMPRESSION: 1. No acute fracture or dislocation. 2. Mild degenerative changes of the right shoulder. Electronically Signed   By: Davina Poke D.O.   On: 10/12/2020 12:55   DG Elbow 2 Views Right  Result Date: 10/12/2020 CLINICAL DATA:  Right elbow pain after fall EXAM: RIGHT ELBOW - 2 VIEW COMPARISON:  None. FINDINGS: There is no evidence of fracture, dislocation, or joint effusion. There is no evidence of arthropathy or other focal bone abnormality. Mild soft tissue edema about the elbow. IV catheter is noted. IMPRESSION: Negative. Electronically Signed   By: Davina Poke D.O.   On: 10/12/2020 12:58   DG Wrist 2 Views Right  Result Date: 10/12/2020 CLINICAL DATA:  Right wrist pain after fall EXAM: RIGHT WRIST - 2 VIEW COMPARISON:  09/30/2016 FINDINGS: There is no evidence of fracture or dislocation. There is no evidence of arthropathy or other focal  bone abnormality. Soft tissue swelling at the ulnar aspect of the distal forearm and wrist. IMPRESSION: Soft tissue swelling without acute fracture or dislocation of the right wrist. Electronically Signed   By: Davina Poke D.O.   On: 10/12/2020 12:56   CT Head Wo Contrast  Result Date: 10/11/2020 CLINICAL DATA:  Acute neuro deficit.  Fall, dizziness EXAM: CT HEAD WITHOUT CONTRAST TECHNIQUE: Contiguous axial images were obtained from the base of the skull through the vertex without intravenous contrast. COMPARISON:  MRI head 01/20/2019 FINDINGS: Brain: No evidence of acute infarction, hemorrhage, hydrocephalus, extra-axial collection or mass lesion/mass effect. Vascular: Negative for hyperdense vessel Skull: Negative Sinuses/Orbits: Air-fluid level sphenoid sinus. Remaining sinuses clear. Negative orbit Other: None IMPRESSION: No acute abnormality Air-fluid level sphenoid sinus. Electronically Signed   By: Franchot Gallo M.D.   On: 10/11/2020 17:16   MR BRAIN WO CONTRAST  Result Date: 10/11/2020 CLINICAL DATA:  Fall with dizziness EXAM: MRI HEAD WITHOUT CONTRAST TECHNIQUE: Multiplanar, multiecho pulse sequences of the brain and surrounding structures were obtained without intravenous contrast. COMPARISON:  01/20/2019 FINDINGS: Brain: No acute infarct, mass effect or extra-axial collection. No acute or chronic hemorrhage. Minimal multifocal hyperintense T2-weight signal within the white matter. The midline structures are normal. Vascular: Major flow voids are preserved. Skull and upper cervical spine: Normal calvarium and skull base. Visualized upper cervical spine and soft tissues are normal. Sinuses/Orbits:No paranasal sinus fluid levels or advanced mucosal thickening. No mastoid or middle ear effusion. Normal orbits. IMPRESSION: No acute intracranial abnormality. Electronically Signed   By: Ulyses Jarred M.D.   On: 10/11/2020 22:08      Subjective: No complaints. Denies dizziness, sob,  cp  Discharge Exam: Vitals:   10/17/20 0325 10/17/20 0842  BP: 130/87 126/83  Pulse: 75 77  Resp: 18 18  Temp: (!) 97.4 F (36.3 C) 97.7 F (36.5 C)  SpO2: 97% 97%   Vitals:   10/16/20 1630 10/16/20 2052 10/17/20 0325 10/17/20 0842  BP: (!) 160/107 134/85 130/87 126/83  Pulse: 87 84 75 77  Resp: 18 20 18 18   Temp: 98.2 F (36.8 C) 97.8 F (36.6 C) (!) 97.4 F (36.3 C) 97.7 F (36.5 C)  TempSrc:   Oral   SpO2: 99% 97% 97% 97%  Weight:      Height:        General: Pt is alert, awake, not in acute distress Cardiovascular: RRR,  S1/S2 +, no rubs, no gallops Respiratory: CTA bilaterally, no wheezing, no rhonchi Abdominal: Soft, NT, ND, bowel sounds + Extremities: no edema    The results of significant diagnostics from this hospitalization (including imaging, microbiology, ancillary and laboratory) are listed below for reference.     Microbiology: Recent Results (from the past 240 hour(s))  Resp Panel by RT-PCR (Flu A&B, Covid) Nasopharyngeal Swab     Status: None   Collection Time: 10/12/20 12:31 AM   Specimen: Nasopharyngeal Swab; Nasopharyngeal(NP) swabs in vial transport medium  Result Value Ref Range Status   SARS Coronavirus 2 by RT PCR NEGATIVE NEGATIVE Final    Comment: (NOTE) SARS-CoV-2 target nucleic acids are NOT DETECTED.  The SARS-CoV-2 RNA is generally detectable in upper respiratory specimens during the acute phase of infection. The lowest concentration of SARS-CoV-2 viral copies this assay can detect is 138 copies/mL. A negative result does not preclude SARS-Cov-2 infection and should not be used as the sole basis for treatment or other patient management decisions. A negative result may occur with  improper specimen collection/handling, submission of specimen other than nasopharyngeal swab, presence of viral mutation(s) within the areas targeted by this assay, and inadequate number of viral copies(<138 copies/mL). A negative result must be combined  with clinical observations, patient history, and epidemiological information. The expected result is Negative.  Fact Sheet for Patients:  EntrepreneurPulse.com.au  Fact Sheet for Healthcare Providers:  IncredibleEmployment.be  This test is no t yet approved or cleared by the [place] FDA and  has been authorized for detection and/or diagnosis of SARS-CoV-2 by FDA under an Emergency Use Authorization (EUA). This EUA will remain  in effect (meaning this test can be used) for the duration of the COVID-19 declaration under Section 564(b)(1) of the Act, 21 U.S.C.section 360bbb-3(b)(1), unless the authorization is terminated  or revoked sooner.       Influenza A by PCR NEGATIVE NEGATIVE Final   Influenza B by PCR NEGATIVE NEGATIVE Final    Comment: (NOTE) The Xpert Xpress SARS-CoV-2/FLU/RSV plus assay is intended as an aid in the diagnosis of influenza from Nasopharyngeal swab specimens and should not be used as a sole basis for treatment. Nasal washings and aspirates are unacceptable for Xpert Xpress SARS-CoV-2/FLU/RSV testing.  Fact Sheet for Patients: EntrepreneurPulse.com.au  Fact Sheet for Healthcare Providers: IncredibleEmployment.be  This test is not yet approved or cleared by the [place] FDA and has been authorized for detection and/or diagnosis of SARS-CoV-2 by FDA under an Emergency Use Authorization (EUA). This EUA will remain in effect (meaning this test can be used) for the duration of the COVID-19 declaration under Section 564(b)(1) of the Act, 21 U.S.C. section 360bbb-3(b)(1), unless the authorization is terminated or revoked.  Performed at [facility], [place], [place] [address]   SARS CORONAVIRUS 2 (TAT 6-24 HRS) Nasopharyngeal Nasopharyngeal Swab     Status: None   Collection Time: 10/15/20  4:33 PM   Specimen: Nasopharyngeal Swab  Result Value Ref  Range Status   SARS Coronavirus 2 NEGATIVE NEGATIVE Final    Comment: (NOTE) SARS-CoV-2 target nucleic acids are NOT DETECTED.  The SARS-CoV-2 RNA is generally detectable in upper and lower respiratory specimens during the acute phase of infection. Negative results do not preclude SARS-CoV-2 infection, do not rule out co-infections with other pathogens, and should not be used as the sole basis for treatment or other patient management decisions. Negative results must be combined with clinical observations, patient history, and epidemiological information.  The expected result is Negative.  Fact Sheet for Patients: SugarRoll.be  Fact Sheet for Healthcare Providers: https://www.woods-mathews.com/  This test is not yet approved or cleared by the [place] FDA and  has been authorized for detection and/or diagnosis of SARS-CoV-2 by FDA under an Emergency Use Authorization (EUA). This EUA will remain  in effect (meaning this test can be used) for the duration of the COVID-19 declaration under Se ction 564(b)(1) of the Act, 21 U.S.C. section 360bbb-3(b)(1), unless the authorization is terminated or revoked sooner.  Performed at [facility], [place] [address] [place], [place] [address]      Labs: BNP (last 3 results) No results for input(s): BNP in the last 8760 hours. Basic Metabolic Panel: Recent Labs  Lab 10/11/20 1341 10/12/20 0517 10/12/20 1431 10/13/20 0520 10/15/20 0411  NA 127* 129* 133* 136 133*  K 3.7 3.2* 3.9 3.9 4.0  CL 92* 94* 99 102 97*  CO2 24 28 29 29 27   GLUCOSE 171* 162* 123* 154* 150*  BUN 15 12 9 8  7*  CREATININE 1.01* 0.82 0.80 0.73 0.70  CALCIUM 8.8* 8.4* 8.4* 8.4* 9.3  MG  --   --  1.7  --   --    Liver Function Tests: No results for input(s): AST, ALT, ALKPHOS, BILITOT, PROT, ALBUMIN in the last 168 hours. No results for input(s): LIPASE, AMYLASE in the last 168 hours. No results for input(s):  AMMONIA in the last 168 hours. CBC: Recent Labs  Lab 10/11/20 1341 10/12/20 0517 10/13/20 0520 10/15/20 0411  WBC 15.1* 7.3 5.0 5.3  HGB 12.5 11.4* 11.5* 12.2  HCT 35.1* 33.7* 33.7* 36.1  MCV 76.8* 79.1* 79.1* 80.9  PLT 170 123* 117* 131*   Cardiac Enzymes: No results for input(s): CKTOTAL, CKMB, CKMBINDEX, TROPONINI in the last 168 hours. BNP: Invalid input(s): POCBNP CBG: Recent Labs  Lab 10/16/20 0748 10/16/20 1150 10/16/20 1617 10/16/20 2157 10/17/20 0729  GLUCAP 158* 192* 142* 148* 169*   D-Dimer No results for input(s): DDIMER in the last 72 hours. Hgb A1c No results for input(s): HGBA1C in the last 72 hours. Lipid Profile No results for input(s): CHOL, HDL, LDLCALC, TRIG, CHOLHDL, LDLDIRECT in the last 72 hours. Thyroid function studies No results for input(s): TSH, T4TOTAL, T3FREE, THYROIDAB in the last 72 hours.  Invalid input(s): FREET3 Anemia work up No results for input(s): VITAMINB12, FOLATE, FERRITIN, TIBC, IRON, RETICCTPCT in the last 72 hours. Urinalysis    Component Value Date/Time   COLORURINE YELLOW (A) 10/11/2020 1341   APPEARANCEUR CLOUDY (A) 10/11/2020 1341   APPEARANCEUR Clear 08/27/2020 1110   LABSPEC 1.017 10/11/2020 1341   LABSPEC 1.009 09/25/2011 1003   PHURINE 5.0 10/11/2020 1341   GLUCOSEU NEGATIVE 10/11/2020 1341   GLUCOSEU Negative 09/25/2011 1003   HGBUR NEGATIVE 10/11/2020 1341   BILIRUBINUR NEGATIVE 10/11/2020 1341   BILIRUBINUR Negative 08/27/2020 1110   BILIRUBINUR Negative 09/25/2011 1003   KETONESUR 5 (A) 10/11/2020 1341   PROTEINUR 30 (A) 10/11/2020 1341   UROBILINOGEN 0.2 03/08/2015 1349   NITRITE NEGATIVE 10/11/2020 1341   LEUKOCYTESUR NEGATIVE 10/11/2020 1341   LEUKOCYTESUR Negative 09/25/2011 1003   Sepsis Labs Invalid input(s): PROCALCITONIN,  WBC,  LACTICIDVEN Microbiology Recent Results (from the past 240 hour(s))  Resp Panel by RT-PCR (Flu A&B, Covid) Nasopharyngeal Swab     Status: None   Collection  Time: 10/12/20 12:31 AM   Specimen: Nasopharyngeal Swab; Nasopharyngeal(NP) swabs in vial transport medium  Result Value Ref Range Status  SARS Coronavirus 2 by RT PCR NEGATIVE NEGATIVE Final    Comment: (NOTE) SARS-CoV-2 target nucleic acids are NOT DETECTED.  The SARS-CoV-2 RNA is generally detectable in upper respiratory specimens during the acute phase of infection. The lowest concentration of SARS-CoV-2 viral copies this assay can detect is 138 copies/mL. A negative result does not preclude SARS-Cov-2 infection and should not be used as the sole basis for treatment or other patient management decisions. A negative result may occur with  improper specimen collection/handling, submission of specimen other than nasopharyngeal swab, presence of viral mutation(s) within the areas targeted by this assay, and inadequate number of viral copies(<138 copies/mL). A negative result must be combined with clinical observations, patient history, and epidemiological information. The expected result is Negative.  Fact Sheet for Patients:  EntrepreneurPulse.com.au  Fact Sheet for Healthcare Providers:  IncredibleEmployment.be  This test is no t yet approved or cleared by the [place] FDA and  has been authorized for detection and/or diagnosis of SARS-CoV-2 by FDA under an Emergency Use Authorization (EUA). This EUA will remain  in effect (meaning this test can be used) for the duration of the COVID-19 declaration under Section 564(b)(1) of the Act, 21 U.S.C.section 360bbb-3(b)(1), unless the authorization is terminated  or revoked sooner.       Influenza A by PCR NEGATIVE NEGATIVE Final   Influenza B by PCR NEGATIVE NEGATIVE Final    Comment: (NOTE) The Xpert Xpress SARS-CoV-2/FLU/RSV plus assay is intended as an aid in the diagnosis of influenza from Nasopharyngeal swab specimens and should not be used as a sole basis for treatment. Nasal washings  and aspirates are unacceptable for Xpert Xpress SARS-CoV-2/FLU/RSV testing.  Fact Sheet for Patients: EntrepreneurPulse.com.au  Fact Sheet for Healthcare Providers: IncredibleEmployment.be  This test is not yet approved or cleared by the [place] FDA and has been authorized for detection and/or diagnosis of SARS-CoV-2 by FDA under an Emergency Use Authorization (EUA). This EUA will remain in effect (meaning this test can be used) for the duration of the COVID-19 declaration under Section 564(b)(1) of the Act, 21 U.S.C. section 360bbb-3(b)(1), unless the authorization is terminated or revoked.  Performed at [facility], [place], [place] [address]   SARS CORONAVIRUS 2 (TAT 6-24 HRS) Nasopharyngeal Nasopharyngeal Swab     Status: None   Collection Time: 10/15/20  4:33 PM   Specimen: Nasopharyngeal Swab  Result Value Ref Range Status   SARS Coronavirus 2 NEGATIVE NEGATIVE Final    Comment: (NOTE) SARS-CoV-2 target nucleic acids are NOT DETECTED.  The SARS-CoV-2 RNA is generally detectable in upper and lower respiratory specimens during the acute phase of infection. Negative results do not preclude SARS-CoV-2 infection, do not rule out co-infections with other pathogens, and should not be used as the sole basis for treatment or other patient management decisions. Negative results must be combined with clinical observations, patient history, and epidemiological information. The expected result is Negative.  Fact Sheet for Patients: SugarRoll.be  Fact Sheet for Healthcare Providers: https://www.woods-mathews.com/  This test is not yet approved or cleared by the [place] FDA and  has been authorized for detection and/or diagnosis of SARS-CoV-2 by FDA under an Emergency Use Authorization (EUA). This EUA will remain  in effect (meaning this test can be used) for the  duration of the COVID-19 declaration under Se ction 564(b)(1) of the Act, 21 U.S.C. section 360bbb-3(b)(1), unless the authorization is terminated or revoked sooner.  Performed at [facility], [place]  [address], [place], [place] [address]      Time coordinating discharge: Over 30 minutes  SIGNED:   Nolberto Hanlon, MD  [facility] 10/17/2020, 10:17 AM Pager   If 7PM-7AM, please contact night-coverage [email or web address] Password [email or web address]

## 2020-10-17 NOTE — Progress Notes (Signed)
Pt request I not notify family of transport to [facility]. This is a change from yesterday when we discussed that I would notify family to include Dreama Saa and Shanon Brow Cutis. NT, [employer], was in pt room at time of new request.  RN blocking note, unsure if stated family above has access to my chart

## 2020-10-23 DIAGNOSIS — E1169 Type 2 diabetes mellitus with other specified complication: Secondary | ICD-10-CM | POA: Diagnosis not present

## 2020-10-23 DIAGNOSIS — R569 Unspecified convulsions: Secondary | ICD-10-CM | POA: Diagnosis not present

## 2020-10-23 DIAGNOSIS — E871 Hypo-osmolality and hyponatremia: Secondary | ICD-10-CM | POA: Diagnosis not present

## 2020-10-23 DIAGNOSIS — R42 Dizziness and giddiness: Secondary | ICD-10-CM | POA: Diagnosis not present

## 2020-10-23 DIAGNOSIS — F3340 Major depressive disorder, recurrent, in remission, unspecified: Secondary | ICD-10-CM | POA: Insufficient documentation

## 2020-11-07 DIAGNOSIS — L600 Ingrowing nail: Secondary | ICD-10-CM | POA: Diagnosis not present

## 2020-11-07 DIAGNOSIS — B351 Tinea unguium: Secondary | ICD-10-CM | POA: Diagnosis not present

## 2020-11-07 DIAGNOSIS — L03031 Cellulitis of right toe: Secondary | ICD-10-CM | POA: Diagnosis not present

## 2020-11-07 DIAGNOSIS — E1140 Type 2 diabetes mellitus with diabetic neuropathy, unspecified: Secondary | ICD-10-CM | POA: Diagnosis not present

## 2020-11-12 ENCOUNTER — Other Ambulatory Visit: Payer: Self-pay | Admitting: Internal Medicine

## 2020-11-12 DIAGNOSIS — M792 Neuralgia and neuritis, unspecified: Secondary | ICD-10-CM

## 2020-12-06 ENCOUNTER — Ambulatory Visit: Payer: Medicare Other | Admitting: Nurse Practitioner

## 2020-12-17 ENCOUNTER — Other Ambulatory Visit: Payer: Self-pay | Admitting: Nurse Practitioner

## 2020-12-17 ENCOUNTER — Other Ambulatory Visit: Payer: Self-pay | Admitting: Internal Medicine

## 2020-12-17 DIAGNOSIS — E1165 Type 2 diabetes mellitus with hyperglycemia: Secondary | ICD-10-CM

## 2021-01-14 ENCOUNTER — Other Ambulatory Visit
Admission: RE | Admit: 2021-01-14 | Discharge: 2021-01-14 | Disposition: A | Discharge status: Home / Self Care | Payer: Medicare Other | Source: Ambulatory Visit | Attending: Nurse Practitioner | Admitting: Nurse Practitioner

## 2021-01-14 ENCOUNTER — Other Ambulatory Visit: Payer: Self-pay

## 2021-01-14 ENCOUNTER — Encounter: Payer: Self-pay | Admitting: Nurse Practitioner

## 2021-01-14 ENCOUNTER — Ambulatory Visit (INDEPENDENT_AMBULATORY_CARE_PROVIDER_SITE_OTHER): Payer: Medicare Other | Admitting: Nurse Practitioner

## 2021-01-14 ENCOUNTER — Other Ambulatory Visit: Payer: Self-pay | Admitting: Internal Medicine

## 2021-01-14 VITALS — BP 128/82 | HR 85 | Temp 98.2°F | Resp 16 | Ht 61.0 in | Wt 199.4 lb

## 2021-01-14 DIAGNOSIS — M545 Low back pain, unspecified: Secondary | ICD-10-CM | POA: Diagnosis not present

## 2021-01-14 DIAGNOSIS — B3731 Acute candidiasis of vulva and vagina: Secondary | ICD-10-CM

## 2021-01-14 DIAGNOSIS — E871 Hypo-osmolality and hyponatremia: Secondary | ICD-10-CM | POA: Diagnosis not present

## 2021-01-14 DIAGNOSIS — E1169 Type 2 diabetes mellitus with other specified complication: Secondary | ICD-10-CM

## 2021-01-14 DIAGNOSIS — E1165 Type 2 diabetes mellitus with hyperglycemia: Secondary | ICD-10-CM

## 2021-01-14 DIAGNOSIS — L03119 Cellulitis of unspecified part of limb: Secondary | ICD-10-CM | POA: Diagnosis not present

## 2021-01-14 DIAGNOSIS — I10 Essential (primary) hypertension: Secondary | ICD-10-CM

## 2021-01-14 DIAGNOSIS — B373 Candidiasis of vulva and vagina: Secondary | ICD-10-CM | POA: Diagnosis not present

## 2021-01-14 DIAGNOSIS — G8929 Other chronic pain: Secondary | ICD-10-CM

## 2021-01-14 LAB — SODIUM: Sodium: 129 mmol/L — ABNORMAL LOW (ref 135–145)

## 2021-01-14 MED ORDER — FLUCONAZOLE 150 MG PO TABS: 150.0000 mg | ORAL_TABLET | Freq: Once | ORAL | 0 refills | Status: AC

## 2021-01-14 MED ORDER — DOXYCYCLINE HYCLATE 100 MG PO TABS: 100.0000 mg | ORAL_TABLET | Freq: Two times a day (BID) | ORAL | 0 refills | Status: AC

## 2021-01-14 MED ORDER — CYCLOBENZAPRINE HCL 10 MG PO TABS: 10.0000 mg | ORAL_TABLET | Freq: Three times a day (TID) | ORAL | 0 refills | Status: DC | PRN

## 2021-01-14 MED ORDER — MUPIROCIN 2 % EX OINT: 1.0000 "application " | TOPICAL_OINTMENT | Freq: Two times a day (BID) | CUTANEOUS | 0 refills | Status: DC

## 2021-01-14 NOTE — Progress Notes (Signed)
[facility] [place], [place] [address]  Internal MEDICINE  Office Visit Note  Patient Name: Christina French  [id number]  [id number]  Date of Service: 01/14/2021  Chief Complaint  Patient presents with   Follow-up    Wants lab work, increased thirst, dizziness, confusion, thinks she may have low sodium    Depression   Diabetes   Gastroesophageal Reflux   Hypertension   Anemia   Anxiety    HPI Christina French presents for a follow up visit to request lab work and is accompanied by her daughter. She has has low sodium in the past and ended up in the hospital. She reports increased thirst, dizziness, and confusion. She has muscle weakness especially in her legs per report.  Cellulitis to lower extremities, she has some areas of broken skin that look like they may be infected per patient report.     Current Medication: Outpatient Encounter Medications as of 01/14/2021  Medication Sig   Accu-Chek Softclix Lancets lancets Use as instructed twice a day Dx E11.65   acetaminophen (TYLENOL) 500 MG tablet Take 1,000 mg by mouth 2 (two) times daily as needed for mild pain or headache.   ALPRAZolam (XANAX) 1 MG tablet Take 1 tablet (1 mg total) by mouth 3 (three) times daily as needed for anxiety.   aspirin EC 81 MG tablet Take 81 mg by mouth daily.   atenolol (TENORMIN) 50 MG tablet TAKE 1 TABLET BY MOUTH TWICE A DAY   Continuous Blood Gluc Sensor (DEXCOM G6 SENSOR) MISC Use every 14 days dx e11.65   [EXPIRED] doxycycline (VIBRA-TABS) 100 MG tablet Take 1 tablet (100 mg total) by mouth 2 (two) times daily for 7 days. With food   escitalopram (LEXAPRO) 20 MG tablet Take 20 mg by mouth daily.    [EXPIRED] fluconazole (DIFLUCAN) 150 MG tablet Take 1 tablet (150 mg total) by mouth once for 1 dose. May take an additional dose 72 hours later if still symptomatic   gabapentin (NEURONTIN) 100 MG capsule TAKE ONE CAPSULE BY MOUTH TWICE A DAY   glimepiride (AMARYL) 2 MG tablet Take 1  tablet (2 mg total) by mouth daily before breakfast.   glucose blood (ACCU-CHEK GUIDE) test strip Use as directed twice a strips twice a day E 11.65   Lacosamide 150 MG TABS Take 150 mg by mouth 2 (two) times daily.    levETIRAcetam (KEPPRA) 500 MG tablet Take 500 mg by mouth 2 (two) times daily.   lisinopril (ZESTRIL) 5 MG tablet TAKE 1 TABLET BY MOUTH DAILY   metFORMIN (GLUCOPHAGE) 500 MG tablet TAKE 1 TABLET BY MOUTH TWICE A DAY WITH A MEAL   mirtazapine (REMERON) 15 MG tablet Take 1 tablet (15 mg total) by mouth at bedtime.   mupirocin ointment (BACTROBAN) 2 % Apply 1 application topically 2 (two) times daily.   omeprazole (PRILOSEC) 40 MG capsule TAKE ONE CAPSULE BY MOUTH TWICE A DAY   primidone (MYSOLINE) 50 MG tablet Take 50 mg by mouth daily.   propranolol (INDERAL) 10 MG tablet Take 10 mg by mouth.   rosuvastatin (CRESTOR) 5 MG tablet TAKE 1 TABLET BY MOUTH DAILY   RYBELSUS 3 MG TABS TAKE 1 TABLET BY MOUTH DAILY BEFORE BREAKFAST WITH NO MORE THAN 4 OZ OF WATER. WAIT 30 MINUTES BEFORE EATING, DRINKING OR TAKING THE REST OF YOUR MEDS   traZODone (DESYREL) 100 MG tablet Take 1 tablet (100 mg total) by mouth at bedtime.   [DISCONTINUED] cyclobenzaprine (FLEXERIL) 10  MG tablet Take 1 tablet (10 mg total) by mouth 3 (three) times daily as needed for muscle spasms.   cyclobenzaprine (FLEXERIL) 10 MG tablet Take 1 tablet (10 mg total) by mouth 3 (three) times daily as needed for muscle spasms.   ferrous sulfate 325 (65 FE) MG EC tablet Take 1 tablet (325 mg total) by mouth 2 (two) times daily.   No facility-administered encounter medications on file as of 01/14/2021.    Surgical History: Past Surgical History:  Procedure Laterality Date   ABDOMINAL HYSTERECTOMY     CESAREAN SECTION     COLONOSCOPY N/A 08/31/2014   Procedure: COLONOSCOPY;  Surgeon: Lucilla Lame, MD;  Location: [facility];  Service: Gastroenterology;  Laterality: N/A;   COLONOSCOPY N/A 08/05/2016   Procedure:  COLONOSCOPY;  Surgeon: [name], MD;  Location: [facility] ENDOSCOPY;  Service: Endoscopy;  Laterality: N/A;   COLONOSCOPY WITH PROPOFOL N/A 12/22/2018   Procedure: COLONOSCOPY WITH PROPOFOL;  Surgeon: Virgel Manifold, MD;  Location: [facility] ENDOSCOPY;  Service: Endoscopy;  Laterality: N/A;   CYST EXCISION  2011   from throat   ESOPHAGOGASTRODUODENOSCOPY N/A 08/31/2014   Procedure: ESOPHAGOGASTRODUODENOSCOPY (EGD);  Surgeon: Lucilla Lame, MD;  Location: [place];  Service: Gastroenterology;  Laterality: N/A;   ESOPHAGOGASTRODUODENOSCOPY N/A 12/21/2018   Procedure: ESOPHAGOGASTRODUODENOSCOPY (EGD);  Surgeon: Virgel Manifold, MD;  Location: [facility] ENDOSCOPY;  Service: Endoscopy;  Laterality: N/A;   ESOPHAGOGASTRODUODENOSCOPY (EGD) WITH PROPOFOL N/A 07/01/2017   Procedure: ESOPHAGOGASTRODUODENOSCOPY (EGD) WITH PROPOFOL;  Surgeon: [name], MD;  Location: [facility] ENDOSCOPY;  Service: Gastroenterology;  Laterality: N/A;   FLEXIBLE SIGMOIDOSCOPY N/A 07/01/2017   Procedure: FLEXIBLE SIGMOIDOSCOPY;  Surgeon: [name], MD;  Location: [facility] ENDOSCOPY;  Service: Gastroenterology;  Laterality: N/A;   GIVENS CAPSULE STUDY  12/22/2018   Procedure: GIVENS CAPSULE STUDY;  Surgeon: Virgel Manifold, MD;  Location: [facility] ENDOSCOPY;  Service: Endoscopy;;   HEMORRHOID SURGERY     INCISION AND DRAINAGE HIP Right 11/25/2014   Procedure: IRRIGATION  AND DRAINAGEOF RIGHT HIP WITH PLACEMENT OF ANTIBIOUTIC BEADS;  Surgeon: Mcarthur Rossetti, MD;  Location: [place];  Service: Orthopedics;  Laterality: Right;   INCISION AND DRAINAGE HIP Right 11/28/2014   Procedure: Repeat I&D Right Hip;  Surgeon: Mcarthur Rossetti, MD;  Location: [place];  Service: Orthopedics;  Laterality: Right;   JOINT REPLACEMENT Right 2007   hip   LOOP RECORDER INSERTION N/A 04/04/2019   Procedure: LOOP RECORDER INSERTION;  Surgeon: Isaias Cowman, MD;  Location: [facility];  Service: Cardiovascular;  Laterality: N/A;    TOTAL HIP REVISION Right 03/19/2015   Procedure: RIGHT TOTAL HIP ARTHROPLASTY REVISION;  Surgeon: Meredith Pel, MD;  Location: [place] [facility];  Service: Orthopedics;  Laterality: Right;   TOTAL HIP REVISION Right 11/18/2015   Procedure: TOTAL HIP REVISION;  Surgeon: Frederik Pear, MD;  Location: [facility];  Service: Orthopedics;  Laterality: Right;    Medical History: Past Medical History:  Diagnosis Date   Anemia    Anxiety    Arthritis    Barrett esophagus    Cancer ([place]) 2016    Skin cancer right leg mylenoma   Depression    Diabetes ([place]) 04/25/2017   GERD (gastroesophageal reflux disease)    History of blood transfusion    with a surgery   History of bronchitis    History of pneumonia    Hypertension    IBS (irritable bowel syndrome)    Pneumonia 09/27/15   PTSD (post-traumatic stress disorder)  Renal insufficiency    reports acute kidney injury   Seizures ([place])    rare - last one 07/30/14 - was very anemic.  Silent seziure -June 2016-    Vertigo    thinks related to sinus issues    Family History: Family History  Problem Relation Age of Onset   Alcoholism Father    Throat cancer Mother    Breast cancer Maternal Aunt     Social History   Socioeconomic History   Marital status: Single    Spouse name: Not on file   Number of children: Not on file   Years of education: Not on file   Highest education level: Not on file  Occupational History   Not on file  Tobacco Use   Smoking status: Never   Smokeless tobacco: Never  Vaping Use   Vaping Use: Never used  Substance and Sexual Activity   Alcohol use: No    Alcohol/week: 0.0 standard drinks   Drug use: No   Sexual activity: Not Currently  Other Topics Concern   Not on file  Social History Narrative   Not on file   Social Determinants of Health   Financial Resource Strain: Not on file  Food Insecurity: Not on file  Transportation Needs: Not on file  Physical Activity: Not on file  Stress: Not on file   Social Connections: Not on file  Intimate Partner Violence: Not on file      Review of Systems  Constitutional:  Positive for fatigue. Negative for appetite change, diaphoresis and fever.  HENT: Negative.    Respiratory: Negative.  Negative for cough, chest tightness, shortness of breath and wheezing.   Cardiovascular: Negative.  Negative for chest pain and palpitations.  Gastrointestinal: Negative.  Negative for abdominal pain, diarrhea, nausea and vomiting.  Endocrine: Positive for polydipsia.  Musculoskeletal: Negative.   Skin: Negative.  Negative for rash.  Neurological:  Positive for dizziness and weakness.  Psychiatric/Behavioral:  Positive for confusion.    Vital Signs: BP 128/82   Pulse 85   Temp 98.2 F (36.8 C)   Resp 16   Ht 5\' 1"  (1.549 m)   Wt 199 lb 6.4 oz (90.4 kg)   SpO2 98%   BMI 37.68 kg/m    Physical Exam Vitals reviewed.  Constitutional:      General: She is not in acute distress.    Appearance: Normal appearance. She is obese. She is not ill-appearing.  HENT:     Head: Normocephalic and atraumatic.  Eyes:     Extraocular Movements: Extraocular movements intact.     Pupils: Pupils are equal, round, and reactive to light.  Cardiovascular:     Rate and Rhythm: Normal rate and regular rhythm.  Pulmonary:     Effort: Pulmonary effort is normal. No respiratory distress.  Skin:    Findings: Rash (redness, rash and swelling noted to lower extremities. some areas of broken skin noted.) present.  Neurological:     Mental Status: She is alert and oriented to person, place, and time.     Cranial Nerves: No cranial nerve deficit.     Coordination: Coordination normal.     Gait: Gait normal.  Psychiatric:        Mood and Affect: Mood normal.        Behavior: Behavior normal.     Assessment/Plan: 1. Chronic hyponatremia Sodium level ordered every 6 weeks - Sodium; Standing  2. Chronic bilateral low back pain without sciatica Muscle relaxant  refilled.  -  cyclobenzaprine (FLEXERIL) 10 MG tablet; Take 1 tablet (10 mg total) by mouth 3 (three) times daily as needed for muscle spasms.  Dispense: 90 tablet; Refill: 0  3. Cellulitis of lower extremity, unspecified laterality Empiric antibiotic oral and topical treatment prescribed.  - doxycycline (VIBRA-TABS) 100 MG tablet; Take 1 tablet (100 mg total) by mouth 2 (two) times daily for 7 days. With food  Dispense: 14 tablet; Refill: 0 - mupirocin ointment (BACTROBAN) 2 %; Apply 1 application topically 2 (two) times daily.  Dispense: 30 g; Refill: 0  4. Vulvovaginal candidiasis Fluconazole prescribed because patient often gets vaginal yeast infection when she takes an antibiotic.  - fluconazole (DIFLUCAN) 150 MG tablet; Take 1 tablet (150 mg total) by mouth once for 1 dose. May take an additional dose 72 hours later if still symptomatic  Dispense: 2 tablet; Refill: 0   General Counseling: meela wareing understanding of the findings of todays visit and agrees with plan of treatment. I have discussed any further diagnostic evaluation that may be needed or ordered today. We also reviewed her medications today. she has been encouraged to call the office with any questions or concerns that should arise related to todays visit.    Orders Placed This Encounter  Procedures   Sodium    Meds ordered this encounter  Medications   cyclobenzaprine (FLEXERIL) 10 MG tablet    Sig: Take 1 tablet (10 mg total) by mouth 3 (three) times daily as needed for muscle spasms.    Dispense:  90 tablet    Refill:  0   doxycycline (VIBRA-TABS) 100 MG tablet    Sig: Take 1 tablet (100 mg total) by mouth 2 (two) times daily for 7 days. With food    Dispense:  14 tablet    Refill:  0   mupirocin ointment (BACTROBAN) 2 %    Sig: Apply 1 application topically 2 (two) times daily.    Dispense:  30 g    Refill:  0   fluconazole (DIFLUCAN) 150 MG tablet    Sig: Take 1 tablet (150 mg total) by mouth once for  1 dose. May take an additional dose 72 hours later if still symptomatic    Dispense:  2 tablet    Refill:  0    Return in 6 weeks (on 02/27/2021) for previously scheduled, F/U, Senita Corredor PCP.   Total time spent:20 Minutes Time spent includes review of chart, medications, test results, and follow up plan with the patient.   [place] Controlled Substance Database was reviewed by me.  This patient was seen by Jonetta Osgood, FNP-C in collaboration with Dr. Clayborn Bigness as a part of collaborative care agreement.   Belma Dyches R. Valetta Fuller, MSN, FNP-C Internal medicine

## 2021-01-17 ENCOUNTER — Telehealth: Payer: Self-pay

## 2021-01-18 ENCOUNTER — Encounter: Payer: Self-pay | Admitting: Nurse Practitioner

## 2021-01-20 ENCOUNTER — Telehealth: Payer: Self-pay

## 2021-01-20 NOTE — Telephone Encounter (Signed)
Christina French send pt message

## 2021-01-21 DIAGNOSIS — K921 Melena: Secondary | ICD-10-CM | POA: Insufficient documentation

## 2021-01-21 DIAGNOSIS — I10 Essential (primary) hypertension: Secondary | ICD-10-CM | POA: Diagnosis not present

## 2021-01-21 DIAGNOSIS — K625 Hemorrhage of anus and rectum: Secondary | ICD-10-CM | POA: Diagnosis not present

## 2021-01-21 DIAGNOSIS — Z7982 Long term (current) use of aspirin: Secondary | ICD-10-CM | POA: Diagnosis not present

## 2021-01-21 DIAGNOSIS — Z7984 Long term (current) use of oral hypoglycemic drugs: Secondary | ICD-10-CM | POA: Diagnosis not present

## 2021-01-21 DIAGNOSIS — Z85828 Personal history of other malignant neoplasm of skin: Secondary | ICD-10-CM | POA: Insufficient documentation

## 2021-01-21 DIAGNOSIS — Z96641 Presence of right artificial hip joint: Secondary | ICD-10-CM | POA: Diagnosis not present

## 2021-01-21 DIAGNOSIS — E785 Hyperlipidemia, unspecified: Secondary | ICD-10-CM | POA: Diagnosis not present

## 2021-01-21 DIAGNOSIS — Z79899 Other long term (current) drug therapy: Secondary | ICD-10-CM | POA: Insufficient documentation

## 2021-01-21 DIAGNOSIS — E1169 Type 2 diabetes mellitus with other specified complication: Secondary | ICD-10-CM | POA: Diagnosis not present

## 2021-01-21 LAB — COMPREHENSIVE METABOLIC PANEL
ALT: 15 U/L (ref 0–44)
AST: 17 U/L (ref 15–41)
Albumin: 4.3 g/dL (ref 3.5–5.0)
Alkaline Phosphatase: 86 U/L (ref 38–126)
Anion gap: 9 (ref 5–15)
BUN: 24 mg/dL — ABNORMAL HIGH (ref 8–23)
CO2: 27 mmol/L (ref 22–32)
Calcium: 9.7 mg/dL (ref 8.9–10.3)
Chloride: 95 mmol/L — ABNORMAL LOW (ref 98–111)
Creatinine, Ser: 0.98 mg/dL (ref 0.44–1.00)
GFR, Estimated: >60 mL/min (ref >60)
Glucose, Bld: 93 mg/dL (ref 70–99)
Potassium: 4.5 mmol/L (ref 3.5–5.1)
Sodium: 131 mmol/L — ABNORMAL LOW (ref 135–145)
Total Bilirubin: 0.7 mg/dL (ref 0.3–1.2)
Total Protein: 7.5 g/dL (ref 6.5–8.1)

## 2021-01-21 LAB — CBC
HCT: 37.3 % (ref 36.0–46.0)
Hemoglobin: 12.5 g/dL (ref 12.0–15.0)
MCH: 27.8 pg (ref 26.0–34.0)
MCHC: 33.5 g/dL (ref 30.0–36.0)
MCV: 82.9 fL (ref 80.0–100.0)
Platelets: 203 10*3/uL (ref 150–400)
RBC: 4.5 MIL/uL (ref 3.87–5.11)
RDW: 13.1 % (ref 11.5–15.5)
WBC: 11.4 10*3/uL — ABNORMAL HIGH (ref 4.0–10.5)
nRBC: 0 % (ref 0.0–0.2)

## 2021-01-21 LAB — SAMPLE TO BLOOD BANK

## 2021-01-21 LAB — CBG MONITORING, ED: Glucose-Capillary: 88 mg/dL (ref 70–99)

## 2021-01-21 NOTE — ED Provider Notes (Signed)
Emergency Medicine Provider Triage Evaluation Note  AYALA RIBBLE , a 65 y.o. female  was evaluated in triage.  Pt complains of bright red blood from rectum x 2 weeks.  No dizziness, lightheadedness, fatigue.  She denies any abdominal pain nausea or vomiting.  Has had some slight diarrhea.  She states the bleeding is constant.  She is established with gastroenterologist, she has had colonoscopies, she states they have not addressed her internal hemorrhoids.  Review of Systems  Positive: Bright red rectal bleeding, diarrhea Negative: Abdominal pain, fevers, nausea, vomiting  Physical Exam  BP 133/85 (BP Location: Left Arm)   Pulse 99   Temp 97.6 F (36.4 C) (Oral)   Resp 17   SpO2 94%  Gen:   Awake, no distress   Resp:  Normal effort  MSK:   Moves extremities without difficulty  Other:  Abdominal soft nontender nondistended  Medical Decision Making  Medically screening exam initiated at 5:30 PM.  Appropriate orders placed.  YIANNA TERSIGNI was informed that the remainder of the evaluation will be completed by another provider, this initial triage assessment does not replace that evaluation, and the importance of remaining in the ED until their evaluation is complete.     Duanne Guess, PA-C 01/21/21 1733    Naaman Plummer, MD 01/21/21 2236

## 2021-01-21 NOTE — ED Notes (Signed)
Fsbs 88

## 2021-01-21 NOTE — ED Notes (Addendum)
Pt presents to ED with c/o melena that started about 2-2.5 weeks ago. Pt states HX internal hemroids. Pt states blood is bright red in color. Pt ambulatory to triage.

## 2021-01-22 ENCOUNTER — Emergency Department
Admission: EM | Admit: 2021-01-22 | Discharge: 2021-01-22 | Disposition: A | Discharge status: Home / Self Care | Payer: Medicare Other | Attending: Emergency Medicine | Admitting: Emergency Medicine

## 2021-01-22 DIAGNOSIS — K921 Melena: Secondary | ICD-10-CM

## 2021-01-22 NOTE — Discharge Instructions (Addendum)
You have been seen in the Emergency Department (ED) for GI bleeding.  Your evaluation did not identify a clear cause of your symptoms but was generally reassuring, including a normal hemoglobin level (normal blood count).  You do not require admission to the hospital at this time, but we encourage close outpatient follow up with the doctor(s) listed above in this paperwork.  Return to the ED if your abdominal pain worsens or fails to improve, you are unable to tolerate fluids due to vomiting, fever greater than 101, or you develop other symptoms that concern you.

## 2021-01-22 NOTE — ED Provider Notes (Signed)
[facility] Emergency Department Provider Note  ____________________________________________   Event Date/Time   First MD Initiated Contact with Patient 01/22/21 [phone]     (approximate)  I have reviewed the triage vital signs and the nursing notes.   HISTORY  Chief Complaint Melena    HPI Christina French is a 65 y.o. female with medical history as listed below who presents for evaluation of about 2 and half weeks of bright red blood per rectum.  She said that it comes out with her stool and sometimes it comes out when she urinates.  She has had similar bleeding issues in the past and sees a GI doctor, Dr. Vicente Males, whom she last saw about a year ago.  She has been told that she has internal hemorrhoids but that there is nothing really that can be done about the bleeding.  However she had been having any bleeding until the last couple of weeks.  She is concerned that her blood level might be low.  She called the office of Dr. Vicente Males today but was told she would not be able to have an appointment for more than a month so she came in here.  She describes the bleeding as moderate to severe.  She has had no other symptoms including excessive fatigue, dizziness, chest pain, shortness of breath, nausea, vomiting, abdominal pain, and dysuria.  Nothing in particular makes it better or worse.     Past Medical History:  Diagnosis Date   Anemia    Anxiety    Arthritis    Barrett esophagus    Cancer ([place]) 2016    Skin cancer right leg mylenoma   Depression    Diabetes ([place]) 04/25/2017   GERD (gastroesophageal reflux disease)    History of blood transfusion    with a surgery   History of bronchitis    History of pneumonia    Hypertension    IBS (irritable bowel syndrome)    Pneumonia 09/27/15   PTSD (post-traumatic stress disorder)    Renal insufficiency    reports acute kidney injury   Seizures ([place])    rare - last one 07/30/14 - was very anemic.  Silent seziure -June  2016-    Vertigo    thinks related to sinus issues    Patient Active Problem List   Diagnosis Date Noted   Vertigo 10/12/2020   Atrial fibrillation, unspecified type ([place]) 10/11/2020   Hyperlipidemia associated with type 2 diabetes mellitus ([place]) 02/02/2020   Uncontrolled type 2 diabetes mellitus with hyperglycemia ([place]) 01/31/2020   Encounter for general adult medical examination with abnormal findings 06/16/2019   Neuralgia and neuritis, unspecified 06/16/2019   Central sleep apnea due to medical condition 02/27/2019   Hematochezia 12/20/2018   Impaired fasting glucose 11/11/2017   Low back pain of thoracolumbar region with sciatica 11/11/2017   Severe anemia    GI bleed 04/25/2017   Symptomatic anemia 04/25/2017   Hyponatremia 04/25/2017   Diabetes ([place]) 04/25/2017   Rectal bleeding 08/03/2016   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   Anemia 04/16/2015   Anxiety disorder 03/20/2015   Acute hyperglycemia 03/20/2015   Pulse irregularity 03/20/2015   Dehydration 03/20/2015   Septic arthritis ([place])    Prosthetic hip infection ([place])    Staphylococcus aureus infection    Enteritis due to Clostridium difficile    Screening for breast cancer    Acute pain of right hip; hip infection 11/24/2014   Right hip pain 11/24/2014  Chronic hyponatremia 11/24/2014   Seizure disorder ([place]) 08/17/2013   Digestive disorder 08/17/2013   Essential hypertension 08/17/2013   Hyperlipidemia 08/17/2013   Seizure ([place]) 08/17/2013    Past Surgical History:  Procedure Laterality Date   ABDOMINAL HYSTERECTOMY     CESAREAN SECTION     COLONOSCOPY N/A 08/31/2014   Procedure: COLONOSCOPY;  Surgeon: Lucilla Lame, MD;  Location: [place];  Service: Gastroenterology;  Laterality: N/A;   COLONOSCOPY N/A 08/05/2016   Procedure: COLONOSCOPY;  Surgeon: [name], MD;  Location: [facility] ENDOSCOPY;  Service: Endoscopy;  Laterality: N/A;   COLONOSCOPY WITH PROPOFOL N/A 12/22/2018   Procedure:  COLONOSCOPY WITH PROPOFOL;  Surgeon: Virgel Manifold, MD;  Location: [facility] ENDOSCOPY;  Service: Endoscopy;  Laterality: N/A;   CYST EXCISION  2011   from throat   ESOPHAGOGASTRODUODENOSCOPY N/A 08/31/2014   Procedure: ESOPHAGOGASTRODUODENOSCOPY (EGD);  Surgeon: Lucilla Lame, MD;  Location: [place];  Service: Gastroenterology;  Laterality: N/A;   ESOPHAGOGASTRODUODENOSCOPY N/A 12/21/2018   Procedure: ESOPHAGOGASTRODUODENOSCOPY (EGD);  Surgeon: Virgel Manifold, MD;  Location: [facility] ENDOSCOPY;  Service: Endoscopy;  Laterality: N/A;   ESOPHAGOGASTRODUODENOSCOPY (EGD) WITH PROPOFOL N/A 07/01/2017   Procedure: ESOPHAGOGASTRODUODENOSCOPY (EGD) WITH PROPOFOL;  Surgeon: [name], MD;  Location[facility] ENDOSCOPY;  Service: Gastroenterology;  Laterality: N/A;   FLEXIBLE SIGMOIDOSCOPY N/A 07/01/2017   Procedure: FLEXIBLE SIGMOIDOSCOPY;  Surgeon: [name], MD;  Location: [facility] ENDOSCOPY;  Service: Gastroenterology;  Laterality: N/A;   GIVENS CAPSULE STUDY  12/22/2018   Procedure: GIVENS CAPSULE STUDY;  Surgeon: Virgel Manifold, MD;  Location: [facility] ENDOSCOPY;  Service: Endoscopy;;   HEMORRHOID SURGERY     INCISION AND DRAINAGE HIP Right 11/25/2014   Procedure: IRRIGATION  AND DRAINAGEOF RIGHT HIP WITH PLACEMENT OF ANTIBIOUTIC BEADS;  Surgeon: Mcarthur Rossetti, MD;  Location: [place];  Service: Orthopedics;  Laterality: Right;   INCISION AND DRAINAGE HIP Right 11/28/2014   Procedure: Repeat I&D Right Hip;  Surgeon: Mcarthur Rossetti, MD;  Location: [place];  Service: Orthopedics;  Laterality: Right;   JOINT REPLACEMENT Right 2007   hip   LOOP RECORDER INSERTION N/A 04/04/2019   Procedure: LOOP RECORDER INSERTION;  Surgeon: Isaias Cowman, MD;  Location: [place] [facility];  Service: Cardiovascular;  Laterality: N/A;   TOTAL HIP REVISION Right 03/19/2015   Procedure: RIGHT TOTAL HIP ARTHROPLASTY REVISION;  Surgeon: Meredith Pel, MD;  Location: [place];  Service: Orthopedics;   Laterality: Right;   TOTAL HIP REVISION Right 11/18/2015   Procedure: TOTAL HIP REVISION;  Surgeon: Frederik Pear, MD;  Location: [place];  Service: Orthopedics;  Laterality: Right;    Prior to Admission medications   Medication Sig Start Date End Date Taking? Authorizing Provider  Accu-Chek Softclix Lancets lancets Use as instructed twice a day Dx E11.65 01/11/20   Ronnell Freshwater, NP  acetaminophen (TYLENOL) 500 MG tablet Take 1,000 mg by mouth 2 (two) times daily as needed for mild pain or headache.    [provider]  ALPRAZolam Duanne Moron) 1 MG tablet Take 1 tablet (1 mg total) by mouth 3 (three) times daily as needed for anxiety. 10/15/20   Lavina Hamman, MD  aspirin EC 81 MG tablet Take 81 mg by mouth daily.    [provider]  atenolol (TENORMIN) 50 MG tablet TAKE 1 TABLET BY MOUTH TWICE A DAY 12/17/20   [name], MD  Continuous Blood Gluc Sensor (DEXCOM G6 SENSOR) MISC Use every 14 days dx e11.65 05/31/20   Kenton Kingfisher,  Tanna Furry, NP  cyclobenzaprine (FLEXERIL) 10 MG tablet Take 1 tablet (10 mg total) by mouth 3 (three) times daily as needed for muscle spasms. 01/14/21   Jonetta Osgood, NP  escitalopram (LEXAPRO) 20 MG tablet Take 20 mg by mouth daily.     [provider]  ferrous sulfate 325 (65 FE) MG EC tablet Take 1 tablet (325 mg total) by mouth 2 (two) times daily. 12/23/18 04/04/19  Saundra Shelling, MD  gabapentin (NEURONTIN) 100 MG capsule TAKE ONE CAPSULE BY MOUTH TWICE A DAY 11/12/20   [name], MD  glimepiride (AMARYL) 2 MG tablet Take 1 tablet (2 mg total) by mouth daily before breakfast. 08/27/20   Luiz Ochoa, NP  glucose blood (ACCU-CHEK GUIDE) test strip Use as directed twice a strips twice a day E 11.65 01/11/20   Ronnell Freshwater, NP  Lacosamide 150 MG TABS Take 150 mg by mouth 2 (two) times daily.     [provider]  levETIRAcetam (KEPPRA) 500 MG tablet Take 500 mg by mouth 2 (two) times daily.    [provider]  lisinopril  (ZESTRIL) 5 MG tablet TAKE 1 TABLET BY MOUTH DAILY 01/14/21   [name], MD  metFORMIN (GLUCOPHAGE) 500 MG tablet TAKE 1 TABLET BY MOUTH TWICE A DAY WITH A MEAL 01/14/21   [name], MD  mirtazapine (REMERON) 15 MG tablet Take 1 tablet (15 mg total) by mouth at bedtime. 10/15/20   Lavina Hamman, MD  mupirocin ointment (BACTROBAN) 2 % Apply 1 application topically 2 (two) times daily. 01/14/21   Jonetta Osgood, NP  omeprazole (PRILOSEC) 40 MG capsule TAKE ONE CAPSULE BY MOUTH TWICE A DAY 11/12/20   [name], MD  primidone (MYSOLINE) 50 MG tablet Take 50 mg by mouth daily. 03/28/20 03/28/21  [provider]  propranolol (INDERAL) 10 MG tablet Take 10 mg by mouth. 03/28/20 03/28/21  [provider]  rosuvastatin (CRESTOR) 5 MG tablet TAKE 1 TABLET BY MOUTH DAILY 01/14/21   [name], MD  RYBELSUS 3 MG TABS TAKE 1 TABLET BY MOUTH DAILY BEFORE BREAKFAST WITH NO MORE THAN 4 OZ OF WATER. WAIT 30 MINUTES BEFORE EATING, DRINKING OR TAKING THE REST OF YOUR MEDS 12/17/20   [name], MD  traZODone (DESYREL) 100 MG tablet Take 1 tablet (100 mg total) by mouth at bedtime. 10/15/20   Lavina Hamman, MD    Allergies Patient has no known allergies.  Family History  Problem Relation Age of Onset   Alcoholism Father    Throat cancer Mother    Breast cancer Maternal Aunt     Social History Social History   Tobacco Use   Smoking status: Never   Smokeless tobacco: Never  Vaping Use   Vaping Use: Never used  Substance Use Topics   Alcohol use: No    Alcohol/week: 0.0 standard drinks   Drug use: No    Review of Systems Constitutional: No fever/chills Eyes: No visual changes. ENT: No sore throat. Cardiovascular: Denies chest pain. Respiratory: Denies shortness of breath. Gastrointestinal: Bright red blood per rectum x2 and half weeks.  No abdominal pain.  No nausea, no vomiting.  No diarrhea.  No constipation. Genitourinary: Negative for dysuria. Musculoskeletal:  Negative for neck pain.  Negative for back pain. Integumentary: Negative for rash. Neurological: Negative for headaches, focal weakness or numbness.   ____________________________________________   PHYSICAL EXAM:  VITAL SIGNS: ED Triage Vitals  Enc Vitals Group  BP 01/21/21 1551 133/85     Pulse Rate 01/21/21 1551 99     Resp 01/21/21 1551 17     Temp 01/21/21 1551 97.6 F (36.4 C)     Temp Source 01/21/21 1551 Oral     SpO2 01/21/21 1551 94 %     Weight --      Height --      Head Circumference --      Peak Flow --      Pain Score 01/21/21 1714 0     Pain Loc --      Pain Edu? --      Excl. in [place]? --     Constitutional: Alert and oriented.  Eyes: Conjunctivae are normal.  Head: Atraumatic. Nose: No congestion/rhinnorhea. Mouth/Throat: Patient is wearing a mask. Neck: No stridor.  No meningeal signs.   Cardiovascular: Normal rate, regular rhythm. Good peripheral circulation. Respiratory: Normal respiratory effort.  No retractions. Gastrointestinal: Soft and nontender. No distention.  Rectal exam notable for a small external hemorrhoid and some bright red blood per rectum on digital exam.  Hemoccult positive.  No tenderness on exam.  ED chaperone present throughout exam. Musculoskeletal: No lower extremity tenderness nor edema. No gross deformities of extremities. Neurologic:  Normal speech and language. No gross focal neurologic deficits are appreciated.  Skin:  Skin is warm and dry.  Chronic skin infections for which she takes doxycycline. Psychiatric: Mood and affect are normal. Speech and behavior are normal.  ____________________________________________   LABS (all labs ordered are listed, but only abnormal results are displayed)  Labs Reviewed  COMPREHENSIVE METABOLIC PANEL - Abnormal; Notable for the following components:      Result Value   Sodium 131 (*)    Chloride 95 (*)    BUN 24 (*)    All other components within normal limits  CBC - Abnormal;  Notable for the following components:   WBC 11.4 (*)    All other components within normal limits  CBG MONITORING, ED  POC OCCULT BLOOD, ED  SAMPLE TO BLOOD BANK   ____________________________________________   INITIAL IMPRESSION / MDM / ASSESSMENT AND PLAN / ED COURSE  As part of my medical decision making, I reviewed the following data within the electronic MEDICAL RECORD NUMBER Nursing notes reviewed and incorporated, Labs reviewed , Old chart reviewed, and Notes from prior ED visits   Differential diagnosis includes, but is not limited to, internal hemorrhoid, external hemorrhoid, diverticulosis, AV malformation bleed, cancer.  Vital signs are stable and within normal limits.  Hemoglobin is normal as is metabolic panel including kidney function.  Gross blood but stable over 2 and half weeks without any evidence of anemia.  No indication for further intervention at this time.  Patient is established with Dr. Vicente Males and I sent him a message through [facility] to make him aware of her ED visit to see if closer follow-up could be arranged.  I had my usual and customary asymptomatic GI bleed discussion with the patient and she understands and agrees with the plan.  She has had no vomiting/hematemesis and is tolerating good oral intake.  She already takes an iron supplement.  I gave my usual and customary follow-up recommendations and return precautions.           ____________________________________________  FINAL CLINICAL IMPRESSION(S) / ED DIAGNOSES  Final diagnoses:  Hematochezia     MEDICATIONS GIVEN DURING THIS VISIT:  Medications - No data to display   ED Discharge Orders  None        Note:  This document was prepared using Dragon voice recognition software and may include unintentional dictation errors.   Hinda Kehr, MD 01/22/21 [phone]

## 2021-01-23 ENCOUNTER — Telehealth: Payer: Self-pay

## 2021-01-23 ENCOUNTER — Other Ambulatory Visit: Payer: Self-pay | Admitting: Nurse Practitioner

## 2021-01-23 NOTE — Telephone Encounter (Signed)
Patient refused ED f/u. She stated she needs ointment refill for cellulitis sent to [facility]. She does not remember name of ointment-Toni

## 2021-01-24 ENCOUNTER — Other Ambulatory Visit: Payer: Self-pay | Admitting: Nurse Practitioner

## 2021-01-27 ENCOUNTER — Other Ambulatory Visit: Payer: Self-pay | Admitting: Internal Medicine

## 2021-01-27 DIAGNOSIS — M792 Neuralgia and neuritis, unspecified: Secondary | ICD-10-CM

## 2021-01-27 NOTE — Telephone Encounter (Signed)
Send message to alyssa to add laborder

## 2021-01-30 ENCOUNTER — Ambulatory Visit (INDEPENDENT_AMBULATORY_CARE_PROVIDER_SITE_OTHER): Payer: Medicare Other | Admitting: Internal Medicine

## 2021-01-30 ENCOUNTER — Other Ambulatory Visit: Payer: Self-pay

## 2021-01-30 ENCOUNTER — Encounter: Payer: Self-pay | Admitting: Internal Medicine

## 2021-01-30 ENCOUNTER — Other Ambulatory Visit
Admission: RE | Admit: 2021-01-30 | Discharge: 2021-01-30 | Disposition: A | Discharge status: Home / Self Care | Payer: Medicare Other | Attending: Internal Medicine | Admitting: Internal Medicine

## 2021-01-30 VITALS — BP 150/94 | HR 89 | Temp 97.8°F | Resp 16 | Ht 61.0 in | Wt 194.0 lb

## 2021-01-30 DIAGNOSIS — R300 Dysuria: Secondary | ICD-10-CM

## 2021-01-30 DIAGNOSIS — I10 Essential (primary) hypertension: Secondary | ICD-10-CM

## 2021-01-30 DIAGNOSIS — E1165 Type 2 diabetes mellitus with hyperglycemia: Secondary | ICD-10-CM

## 2021-01-30 DIAGNOSIS — N390 Urinary tract infection, site not specified: Secondary | ICD-10-CM

## 2021-01-30 DIAGNOSIS — R21 Rash and other nonspecific skin eruption: Secondary | ICD-10-CM

## 2021-01-30 LAB — CBC WITH DIFFERENTIAL/PLATELET
Abs Immature Granulocytes: 0.04 10*3/uL (ref 0.00–0.07)
Basophils Absolute: 0 10*3/uL (ref 0.0–0.1)
Basophils Relative: 0 %
Eosinophils Absolute: 0.2 10*3/uL (ref 0.0–0.5)
Eosinophils Relative: 2 %
HCT: 36.5 % (ref 36.0–46.0)
Hemoglobin: 12.6 g/dL (ref 12.0–15.0)
Immature Granulocytes: 0 %
Lymphocytes Relative: 20 %
Lymphs Abs: 2 10*3/uL (ref 0.7–4.0)
MCH: 29.1 pg (ref 26.0–34.0)
MCHC: 34.5 g/dL (ref 30.0–36.0)
MCV: 84.3 fL (ref 80.0–100.0)
Monocytes Absolute: 0.8 10*3/uL (ref 0.1–1.0)
Monocytes Relative: 8 %
Neutro Abs: 7 10*3/uL (ref 1.7–7.7)
Neutrophils Relative %: 70 %
Platelets: 207 10*3/uL (ref 150–400)
RBC: 4.33 MIL/uL (ref 3.87–5.11)
RDW: 12.6 % (ref 11.5–15.5)
WBC: 10.1 10*3/uL (ref 4.0–10.5)
nRBC: 0 % (ref 0.0–0.2)

## 2021-01-30 LAB — COMPREHENSIVE METABOLIC PANEL
ALT: 16 U/L (ref 0–44)
AST: 17 U/L (ref 15–41)
Albumin: 4.4 g/dL (ref 3.5–5.0)
Alkaline Phosphatase: 75 U/L (ref 38–126)
Anion gap: 9 (ref 5–15)
BUN: 19 mg/dL (ref 8–23)
CO2: 28 mmol/L (ref 22–32)
Calcium: 9.4 mg/dL (ref 8.9–10.3)
Chloride: 93 mmol/L — ABNORMAL LOW (ref 98–111)
Creatinine, Ser: 1 mg/dL (ref 0.44–1.00)
GFR, Estimated: >60 mL/min (ref >60)
Glucose, Bld: 93 mg/dL (ref 70–99)
Potassium: 4.5 mmol/L (ref 3.5–5.1)
Sodium: 130 mmol/L — ABNORMAL LOW (ref 135–145)
Total Bilirubin: 0.6 mg/dL (ref 0.3–1.2)
Total Protein: 7.3 g/dL (ref 6.5–8.1)

## 2021-01-30 LAB — POCT URINALYSIS DIPSTICK
Bilirubin, UA: NEGATIVE
Blood, UA: NEGATIVE
Glucose, UA: NEGATIVE
Ketones, UA: NEGATIVE
Nitrite, UA: POSITIVE
Protein, UA: NEGATIVE
Spec Grav, UA: 1.02 (ref 1.010–1.025)
Urobilinogen, UA: 0.2 E.U./dL
pH, UA: 6 (ref 5.0–8.0)

## 2021-01-30 LAB — SEDIMENTATION RATE: Sed Rate: 13 mm/hr (ref 0–30)

## 2021-01-30 MED ORDER — BETAMETHASONE DIPROPIONATE AUG 0.05 % EX OINT: TOPICAL_OINTMENT | Freq: Two times a day (BID) | CUTANEOUS | 1 refills | Status: DC

## 2021-01-30 MED ORDER — DEXCOM G6 TRANSMITTER MISC: 0 refills | Status: DC

## 2021-01-30 MED ORDER — DEXCOM G6 RECEIVER DEVI: 0 refills | Status: DC

## 2021-01-30 MED ORDER — NITROFURANTOIN MONOHYD MACRO 100 MG PO CAPS: 100.0000 mg | ORAL_CAPSULE | Freq: Two times a day (BID) | ORAL | 0 refills | Status: DC

## 2021-01-30 MED ORDER — DEXCOM G6 SENSOR MISC: 2 refills | Status: DC

## 2021-01-30 NOTE — Progress Notes (Signed)
[facility] [place], [place] [address]  Internal MEDICINE  Office Visit Note  Patient Name: Christina French  [id number]  [id number]  Date of Service: 01/30/2021  Chief Complaint  Patient presents with   Acute Visit   Rash    Very itchy, on right leg and on back of neck, and other places  on body.  Started about a month ago    HPI Patient is here with her daughter with ongoing rash, it appears as a hive very itchy, then develops in 2 a scab is about 0.5 to 1 cm in diameter scabbed over multiple areas of the body is is involved.. Patient is on multiple medications including antiseizure therapy, patient is also on multiple diabetic medications. She is complaining of dysuria and frequency of urine for the last few days. All medications reviewed today there is duplication in therapy which will be evaluated we will get a list from the pharmacy    Current Medication: Outpatient Encounter Medications as of 01/30/2021  Medication Sig   Accu-Chek Softclix Lancets lancets Use as instructed twice a day Dx E11.65   acetaminophen (TYLENOL) 500 MG tablet Take 1,000 mg by mouth 2 (two) times daily as needed for mild pain or headache.   ALPRAZolam (XANAX) 1 MG tablet Take 1 tablet (1 mg total) by mouth 3 (three) times daily as needed for anxiety.   aspirin EC 81 MG tablet Take 81 mg by mouth daily.   atenolol (TENORMIN) 50 MG tablet TAKE 1 TABLET BY MOUTH TWICE A DAY   Continuous Blood Gluc Sensor (DEXCOM G6 SENSOR) MISC Use every 14 days dx e11.65   cyclobenzaprine (FLEXERIL) 10 MG tablet Take 1 tablet (10 mg total) by mouth 3 (three) times daily as needed for muscle spasms.   escitalopram (LEXAPRO) 20 MG tablet Take 20 mg by mouth daily.    gabapentin (NEURONTIN) 100 MG capsule TAKE ONE CAPSULE BY MOUTH TWICE A DAY   glimepiride (AMARYL) 2 MG tablet Take 1 tablet (2 mg total) by mouth daily before breakfast.   glucose blood (ACCU-CHEK GUIDE) test strip Use as directed  twice a strips twice a day E 11.65   Lacosamide 150 MG TABS Take 150 mg by mouth 2 (two) times daily.    levETIRAcetam (KEPPRA) 500 MG tablet Take 500 mg by mouth 2 (two) times daily.   lisinopril (ZESTRIL) 5 MG tablet TAKE 1 TABLET BY MOUTH DAILY   metFORMIN (GLUCOPHAGE) 500 MG tablet TAKE 1 TABLET BY MOUTH TWICE A DAY WITH A MEAL   mirtazapine (REMERON) 15 MG tablet Take 1 tablet (15 mg total) by mouth at bedtime.   mupirocin ointment (BACTROBAN) 2 % Apply 1 application topically 2 (two) times daily.   omeprazole (PRILOSEC) 40 MG capsule TAKE ONE CAPSULE BY MOUTH TWICE A DAY   primidone (MYSOLINE) 50 MG tablet Take 50 mg by mouth daily.   propranolol (INDERAL) 10 MG tablet Take 10 mg by mouth.   rosuvastatin (CRESTOR) 5 MG tablet TAKE 1 TABLET BY MOUTH DAILY   RYBELSUS 3 MG TABS TAKE 1 TABLET BY MOUTH DAILY BEFORE BREAKFAST WITH NO MORE THAN 4 OZ OF WATER. WAIT 30 MINUTES BEFORE EATING, DRINKING OR TAKING THE REST OF YOUR MEDS   traZODone (DESYREL) 100 MG tablet Take 1 tablet (100 mg total) by mouth at bedtime.   ferrous sulfate 325 (65 FE) MG EC tablet Take 1 tablet (325 mg total) by mouth 2 (two) times daily.   No facility-administered  encounter medications on file as of 01/30/2021.    Surgical History: Past Surgical History:  Procedure Laterality Date   ABDOMINAL HYSTERECTOMY     CESAREAN SECTION     COLONOSCOPY N/A 08/31/2014   Procedure: COLONOSCOPY;  Surgeon: Lucilla Lame, MD;  Location: [place];  Service: Gastroenterology;  Laterality: N/A;   COLONOSCOPY N/A 08/05/2016   Procedure: COLONOSCOPY;  Surgeon: [name], MD;  Location: [facility] ENDOSCOPY;  Service: Endoscopy;  Laterality: N/A;   COLONOSCOPY WITH PROPOFOL N/A 12/22/2018   Procedure: COLONOSCOPY WITH PROPOFOL;  Surgeon: Virgel Manifold, MD;  Location: [facility] ENDOSCOPY;  Service: Endoscopy;  Laterality: N/A;   CYST EXCISION  2011   from throat   ESOPHAGOGASTRODUODENOSCOPY N/A 08/31/2014   Procedure:  ESOPHAGOGASTRODUODENOSCOPY (EGD);  Surgeon: Lucilla Lame, MD;  Location: [place];  Service: Gastroenterology;  Laterality: N/A;   ESOPHAGOGASTRODUODENOSCOPY N/A 12/21/2018   Procedure: ESOPHAGOGASTRODUODENOSCOPY (EGD);  Surgeon: Virgel Manifold, MD;  Location: [facility] ENDOSCOPY;  Service: Endoscopy;  Laterality: N/A;   ESOPHAGOGASTRODUODENOSCOPY (EGD) WITH PROPOFOL N/A 07/01/2017   Procedure: ESOPHAGOGASTRODUODENOSCOPY (EGD) WITH PROPOFOL;  Surgeon: [name], MD;  Location: [facility] ENDOSCOPY;  Service: Gastroenterology;  Laterality: N/A;   FLEXIBLE SIGMOIDOSCOPY N/A 07/01/2017   Procedure: FLEXIBLE SIGMOIDOSCOPY;  Surgeon: [name], MD;  Location: [facility] ENDOSCOPY;  Service: Gastroenterology;  Laterality: N/A;   GIVENS CAPSULE STUDY  12/22/2018   Procedure: GIVENS CAPSULE STUDY;  Surgeon: Virgel Manifold, MD;  Location: [facility] ENDOSCOPY;  Service: Endoscopy;;   HEMORRHOID SURGERY     INCISION AND DRAINAGE HIP Right 11/25/2014   Procedure: IRRIGATION  AND DRAINAGEOF RIGHT HIP WITH PLACEMENT OF ANTIBIOUTIC BEADS;  Surgeon: Mcarthur Rossetti, MD;  Location: [place];  Service: Orthopedics;  Laterality: Right;   INCISION AND DRAINAGE HIP Right 11/28/2014   Procedure: Repeat I&D Right Hip;  Surgeon: Mcarthur Rossetti, MD;  Location: [place];  Service: Orthopedics;  Laterality: Right;   JOINT REPLACEMENT Right 2007   hip   LOOP RECORDER INSERTION N/A 04/04/2019   Procedure: LOOP RECORDER INSERTION;  Surgeon: Isaias Cowman, MD;  Location: [place] CV LAB;  Service: Cardiovascular;  Laterality: N/A;   TOTAL HIP REVISION Right 03/19/2015   Procedure: RIGHT TOTAL HIP ARTHROPLASTY REVISION;  Surgeon: Meredith Pel, MD;  Location: [place];  Service: Orthopedics;  Laterality: Right;   TOTAL HIP REVISION Right 11/18/2015   Procedure: TOTAL HIP REVISION;  Surgeon: Frederik Pear, MD;  Location: [place];  Service: Orthopedics;  Laterality: Right;    Medical History: Past Medical  History:  Diagnosis Date   Anemia    Anxiety    Arthritis    Barrett esophagus    Cancer ([place]) 2016    Skin cancer right leg mylenoma   Depression    Diabetes ([place]) 04/25/2017   GERD (gastroesophageal reflux disease)    History of blood transfusion    with a surgery   History of bronchitis    History of pneumonia    Hypertension    IBS (irritable bowel syndrome)    Pneumonia 09/27/15   PTSD (post-traumatic stress disorder)    Renal insufficiency    reports acute kidney injury   Seizures ([place])    rare - last one 07/30/14 - was very anemic.  Silent seziure -June 2016-    Vertigo    thinks related to sinus issues    Family History: Family History  Problem Relation Age of Onset   Alcoholism Father    Throat cancer Mother  Breast cancer Maternal Aunt     Social History   Socioeconomic History   Marital status: Single    Spouse name: Not on file   Number of children: Not on file   Years of education: Not on file   Highest education level: Not on file  Occupational History   Not on file  Tobacco Use   Smoking status: Never   Smokeless tobacco: Never  Vaping Use   Vaping Use: Never used  Substance and Sexual Activity   Alcohol use: No    Alcohol/week: 0.0 standard drinks   Drug use: No   Sexual activity: Not Currently  Other Topics Concern   Not on file  Social History Narrative   Not on file   Social Determinants of Health   Financial Resource Strain: Not on file  Food Insecurity: Not on file  Transportation Needs: Not on file  Physical Activity: Not on file  Stress: Not on file  Social Connections: Not on file  Intimate Partner Violence: Not on file      Review of Systems  Constitutional:  Negative for fatigue and fever.  HENT:  Negative for congestion, mouth sores and postnasal drip.   Respiratory:  Negative for cough.   Cardiovascular:  Negative for chest pain.  Genitourinary:  Negative for flank pain.  Skin:  Positive for rash.   Psychiatric/Behavioral: Negative.     Vital Signs: BP (!) 150/94   Pulse 89   Temp 97.8 F (36.6 C)   Resp 16   Ht $R'5\' 1"'RO$  (1.549 m)   Wt 194 lb (88 kg)   SpO2 98%   BMI 36.66 kg/m    Physical Exam Constitutional:      Appearance: Normal appearance.  HENT:     Head: Normocephalic and atraumatic.     Nose: Nose normal.     Mouth/Throat:     Mouth: Mucous membranes are moist.     Pharynx: No posterior oropharyngeal erythema.  Eyes:     Extraocular Movements: Extraocular movements intact.     Pupils: Pupils are equal, round, and reactive to light.  Cardiovascular:     Pulses: Normal pulses.     Heart sounds: Normal heart sounds.  Pulmonary:     Effort: Pulmonary effort is normal.     Breath sounds: Normal breath sounds.  Skin:    Findings: Lesion and rash present.     Comments: Multiple areas of excoriation is seen on her back, multiple it is involved with rash questionable drug related rash  Neurological:     General: No focal deficit present.     Mental Status: She is alert.  Psychiatric:        Mood and Affect: Mood normal.        Behavior: Behavior normal.       Assessment/Plan: 1. Rash in adult Came in multifactorial questionable etiology we will avoid treating with oral medications, patient is instructed to stop her mirtazapine for now until seen by dermatology - Sed Rate (ESR) - ANA w/Reflex if Positive; Future - CBC with Differential/Platelet - Ambulatory referral to Dermatology - augmented betamethasone dipropionate (DIPROLENE) 0.05 % ointment; Apply topically 2 (two) times daily.  Dispense: 45 g; Refill: 1 - Comprehensive metabolic panel  2. Essential hypertension Patient is on duplicate beta-blocker therapy we will get med list from the pharmacy and modify treatment as indicated - Comprehensive metabolic panel  3. Uncontrolled type 2 diabetes mellitus with hyperglycemia [facility]) Patient is also on triple medications for  diabetes we will get new med  list and treat - Comprehensive metabolic panel  4. Dysuria Urine is positive for nitrite we will have him treat with Macrobid until cultures and sensitivities are available - POCT Urinalysis Dipstick - CULTURE, URINE COMPREHENSIVE - nitrofurantoin, macrocrystal-monohydrate, (MACROBID) 100 MG capsule; Take 1 capsule (100 mg total) by mouth 2 (two) times daily.  Dispense: 14 capsule; Refill: 0     General Counseling: daphney hopke understanding of the findings of todays visit and agrees with plan of treatment. I have discussed any further diagnostic evaluation that may be needed or ordered today. We also reviewed her medications today. she has been encouraged to call the office with any questions or concerns that should arise related to todays visit.    Orders Placed This Encounter  Procedures   Sed Rate (ESR)   ANA w/Reflex if Positive   CBC with Differential/Platelet   Ambulatory referral to Dermatology    No orders of the defined types were placed in this encounter.   Total time spent:35 Minutes Time spent includes review of chart, medications, test results, and follow up plan with the patient.   [place] Controlled Substance Database was reviewed by me.   Dr [name] Internal medicine

## 2021-02-02 ENCOUNTER — Telehealth: Payer: Self-pay

## 2021-02-02 NOTE — Telephone Encounter (Signed)
PA sent for [facility] G6 Transmitter, Sensor and Receiver  on 02/02/21 at 951 pm, sent 3 different request for PA's,

## 2021-02-03 LAB — CULTURE, URINE COMPREHENSIVE

## 2021-02-04 ENCOUNTER — Ambulatory Visit (INDEPENDENT_AMBULATORY_CARE_PROVIDER_SITE_OTHER): Payer: Medicare Other | Admitting: Dermatology

## 2021-02-04 ENCOUNTER — Other Ambulatory Visit: Payer: Self-pay

## 2021-02-04 DIAGNOSIS — L299 Pruritus, unspecified: Secondary | ICD-10-CM | POA: Diagnosis not present

## 2021-02-04 DIAGNOSIS — R21 Rash and other nonspecific skin eruption: Secondary | ICD-10-CM | POA: Diagnosis not present

## 2021-02-04 DIAGNOSIS — L509 Urticaria, unspecified: Secondary | ICD-10-CM | POA: Diagnosis not present

## 2021-02-04 MED ORDER — CETIRIZINE HCL 10 MG PO TABS: ORAL_TABLET | ORAL | 1 refills | Status: DC

## 2021-02-04 MED ORDER — HYDROXYZINE HCL 25 MG PO TABS: ORAL_TABLET | ORAL | 0 refills | Status: DC

## 2021-02-04 NOTE — Progress Notes (Signed)
New Patient Visit  Subjective  Christina French is a 65 y.o. female who presents for the following: Rash (Patient here today for itchy rash all over body, present for about 1 month. PCP thinks could be related to medications. PCP gave patient mupirocin, doxycycline and then betamethasone ointment. She is also taking Benadryl. ).  Rash started at right lower leg. Patient accompanied by daughter.   Patient was put on some new medications by neurologist a few months ago. Medications listed are not 100% accurate due to some of the drs not being on Epic and there is concern that she may be on too many.   The following portions of the chart were reviewed this encounter and updated as appropriate:   Tobacco  Allergies  Meds  Problems  Med Hx  Surg Hx  Fam Hx      Review of Systems:  No other skin or systemic complaints except as noted in HPI or Assessment and Plan.  Objective  Well appearing patient in no apparent distress; mood and affect are within normal limits.  A focused examination was performed including arms, legs, chest, back, abdomen. Relevant physical exam findings are noted in the Assessment and Plan.  right chest lesional, right chest perilesional Scaly pink plaques, edematous pink plaques, many excoriations scattered at back, legs, arms, chest. No oozing or blisters per patient.        Assessment & Plan  Rash right chest perilesional; right chest lesional  Right chest lesional Right chest perilesional for DIF  R/o urticaria vs urticarial vasculitis vs hypersensitivity vs bullous pemphigoid   Skin / nail biopsy - right chest lesional Type of biopsy: punch   Informed consent: discussed and consent obtained   Timeout: patient name, date of birth, surgical site, and procedure verified   Patient was prepped and draped in usual sterile fashion: Area prepped with isopropyl alcohol. Anesthesia: the lesion was anesthetized in a standard fashion   Anesthetic:  1%  lidocaine w/ epinephrine 1-100,000 buffered w/ 8.4% NaHCO3 Punch size:  4 mm Suture size:  4-0 Suture type: Prolene (polypropylene)   Suture removal (days):  14 Hemostasis achieved with: suture and aluminum chloride   Outcome: patient tolerated procedure well   Post-procedure details: wound care instructions given   Additional details:  Mupirocin and a dressing applied  Skin / nail biopsy - right chest perilesional Type of biopsy: punch   Informed consent: discussed and consent obtained   Timeout: patient name, date of birth, surgical site, and procedure verified   Patient was prepped and draped in usual sterile fashion: Area prepped with isopropyl alcohol. Anesthesia: the lesion was anesthetized in a standard fashion   Anesthetic:  1% lidocaine w/ epinephrine 1-100,000 buffered w/ 8.4% NaHCO3 Punch size:  4 mm Suture size:  4-0 Suture type: Prolene (polypropylene)   Suture removal (days):  14 Hemostasis achieved with: suture and aluminum chloride   Outcome: patient tolerated procedure well   Post-procedure details: wound care instructions given   Additional details:  Mupirocin and a dressing applied  Specimen 1 - Surgical pathology Differential Diagnosis: R/o urticaria vs urticarial vasculitis vs hypersensitivity vs bullous pemphigoid  Check Margins: No Scaly pink plaques, edematous pink plaques, many excoriations scattered at back, legs, arms, chest. No oozing or blisters per patient.   Right chest perilesional for DIF    Specimen 2 - Surgical pathology Differential Diagnosis: R/o urticaria vs urticarial vasculitis vs hypersensitivity vs bullous pemphigoid  Check Margins: No Scaly pink plaques, edematous  pink plaques, many excoriations scattered at back, legs, arms, chest. No oozing or blisters per patient.   DIF    Pruritus Chest - Medial (Center)  Significantly affecting quality of life  Discontinue Benadryl  Continue hydroxyzine 25mg . Can increase to 1 tablet  every 6-8 hours as needed for itch. At the bedtime dose, you can take up to 2 tablets. This medication causes drowsiness - avoid driving or operating machinery while taking. Decrease dose if you have any difficulty peeing, blurred vision or bothersome dry mouth  Start cetirizine 1 tablet twice a day for rash. If rash and itch still bothersome and no side effects (like dry mouth, blurry vision, difficulty peeing), increase to 2 tablets twice a day.   Return in about 2 weeks (around 02/18/2021) for Suture Removal.  Graciella Belton, RMA, am acting as scribe for Forest Gleason, MD .   Documentation: I have reviewed the above documentation for accuracy and completeness, and I agree with the above.  Forest Gleason, MD

## 2021-02-04 NOTE — Patient Instructions (Addendum)
Wound Care Instructions  Cleanse wound gently with soap and water once a day then pat dry with clean gauze. Apply a thing coat of Petrolatum (petroleum jelly, "Vaseline") over the wound (unless you have an allergy to this). We recommend that you use a new, sterile tube of Vaseline. Do not pick or remove scabs. Do not remove the yellow or white "healing tissue" from the base of the wound.  Cover the wound with fresh, clean, nonstick gauze and secure with paper tape. You may use Band-Aids in place of gauze and tape if the would is small enough, but would recommend trimming much of the tape off as there is often too much. Sometimes Band-Aids can irritate the skin.  You should call the office for your biopsy report after 1 week if you have not already been contacted.  If you experience any problems, such as abnormal amounts of bleeding, swelling, significant bruising, significant pain, or evidence of infection, please call the office immediately.  FOR ADULT SURGERY PATIENTS: If you need something for pain relief you may take 1 extra strength Tylenol (acetaminophen) AND 2 Ibuprofen (200mg  each) together every 4 hours as needed for pain. (do not take these if you are allergic to them or if you have a reason you should not take them.) Typically, you may only need pain medication for 1 to 3 days.   Recommend OTC Gold Bond Rapid Relief Anti-Itch cream (pramoxine + menthol), CeraVe Anti-itch cream or lotion (pramoxine), Sarna lotion (Original- menthol + camphor or Sensitive- pramoxine) or Eucerin 12 hour Itch Relief lotion (menthol) up to 3 times per day to areas on body that are itchy.

## 2021-02-04 NOTE — Addendum Note (Signed)
Addended by: [name] on: 02/04/2021 01:36 PM   Modules accepted: Orders

## 2021-02-06 ENCOUNTER — Other Ambulatory Visit: Payer: Self-pay | Admitting: Dermatology

## 2021-02-10 ENCOUNTER — Telehealth: Payer: Self-pay

## 2021-02-10 ENCOUNTER — Other Ambulatory Visit: Payer: Self-pay

## 2021-02-10 MED ORDER — FAMOTIDINE 20 MG PO TABS: 20.0000 mg | ORAL_TABLET | Freq: Two times a day (BID) | ORAL | 1 refills | Status: DC

## 2021-02-10 NOTE — Telephone Encounter (Signed)
Patient advised bx results showed hives/urticaria. She will bring list of meds at suture removal. Sent in famotidine 20 mg for patient to start taking twice daily, she will continue cetirizine./js

## 2021-02-10 NOTE — Telephone Encounter (Signed)
Thank you. That is good to know. However, I would still like her to please bring info on all new medications she has started in the last 6 months so we can try and figure out the most likely culprit based on both timeline and likelihood of each drug causing this type of rash. Thank you!

## 2021-02-10 NOTE — Telephone Encounter (Signed)
-----   Message from Alfonso Patten, MD sent at 02/06/2021  5:24 PM EDT ----- 1. Skin , right chest lesional URTICARIA "Hives"   2. Direct Immunofluorescence, right chest perilesional NEGATIVE FOR IMMUNOREACTANTS "no evidence of bullous pemphigoid, an autoimmune condition that can cause a similar looking rash"  --> Recommend continuing cetirizine 10 mg twice a day, can increase to 20 mg twice a day if still getting hives and not getting drowsy.  If she does not tolerate the cetirizine due to drowsiness, switch to Xyzal (one tablet once to twice a day, can increase to maximum of two tablets twice a day if needed and no side effects).  Recommend adding famotidine 20 mg twice a day. We can prescribe this to see if insurance will cover.   This could be caused by medication but much of the time we never find a trigger. In half of people, hives will go away on their own in a year.   Recommend bringing a list of all medications she has taken in the last 3 months to suture removal appointment as well the start dates for any medications started in the last 6 months.   MAs please call. Thank you!

## 2021-02-10 NOTE — Telephone Encounter (Signed)
Patient called office today to advise that she thinks rash may be a results of Rybelsus, a medication she started 09/06/20 for her diabetes.

## 2021-02-11 ENCOUNTER — Other Ambulatory Visit: Payer: Self-pay | Admitting: Internal Medicine

## 2021-02-11 ENCOUNTER — Other Ambulatory Visit: Payer: Self-pay

## 2021-02-11 ENCOUNTER — Telehealth: Payer: Self-pay

## 2021-02-11 DIAGNOSIS — E1165 Type 2 diabetes mellitus with hyperglycemia: Secondary | ICD-10-CM

## 2021-02-11 MED ORDER — GLIMEPIRIDE 2 MG PO TABS: 2.0000 mg | ORAL_TABLET | Freq: Every day | ORAL | 1 refills | Status: DC

## 2021-02-11 NOTE — Telephone Encounter (Signed)
error 

## 2021-02-11 NOTE — Telephone Encounter (Signed)
Thank you :)

## 2021-02-11 NOTE — Telephone Encounter (Signed)
Ok thank u

## 2021-02-13 ENCOUNTER — Encounter: Payer: Self-pay | Admitting: Dermatology

## 2021-02-19 ENCOUNTER — Other Ambulatory Visit: Payer: Self-pay

## 2021-02-19 ENCOUNTER — Encounter: Payer: Self-pay | Admitting: Dermatology

## 2021-02-19 ENCOUNTER — Ambulatory Visit (INDEPENDENT_AMBULATORY_CARE_PROVIDER_SITE_OTHER): Payer: Medicare Other | Admitting: Dermatology

## 2021-02-19 DIAGNOSIS — L509 Urticaria, unspecified: Secondary | ICD-10-CM

## 2021-02-19 NOTE — Patient Instructions (Addendum)
Increase zyrtec to 2 in morning and 2 at night.   Restart hydroxyzine 25 mg tablet   Recommend OTC Gold Bond Rapid Relief Anti-Itch cream (pramoxine + menthol), CeraVe Anti-itch cream or lotion (pramoxine), Sarna lotion (Original- menthol + camphor or Sensitive- pramoxine) or Eucerin 12 hour Itch Relief lotion (menthol) up to 3 times per day to areas on body that are itchy.  Vaseline few times daily at areas scratched to help heal faster  After Suture Removal  If your medical team has placed Steri-Strips (white adhesive strips covering the surgical site to provide extra support): Keep the area dry until they fall off.  Do not peel them off. Just let them fall off on their own.  If the edges peel up, you can trim them with scissors.   If your team has not placed Steri-Strips: Wash the area daily with soap and water. Then coat the incision site with plain Vaseline and cover with a bandage. Do this daily for 5 days after the sutures are removed. After that, no additional wound care is generally needed.  However, if you would like to help fade the scar, you can apply a silicone scar cream, gel or sheet every night. The scar will remodel for one year after the procedure. If a skin cancer was removed, be sure to keep your appointment with your dermatologist for follow-up and let your dermatology team know if you have any new or changing spots between visits.    Please call our office at [phone] for any questions or concerns.  If you have any questions or concerns for your doctor, please call our main line at [phone] and press option 4 to reach your doctor's medical assistant. If no one answers, please leave a voicemail as directed and we will return your call as soon as possible. Messages left after 4 pm will be answered the following business day.   You may also send [place] a message via [place]. We typically respond to MyChart messages within 1-2 business days.  For prescription  refills, please ask your pharmacy to contact our office. Our fax number is [phone].  If you have an urgent issue when the clinic is closed that cannot wait until the next business day, you can page your doctor at the number below.    Please note that while we do our best to be available for urgent issues outside of office hours, we are not available 24/7.   If you have an urgent issue and are unable to reach [place], you may choose to seek medical care at your doctor's office, retail clinic, urgent care center, or emergency room.  If you have a medical emergency, please immediately call 911 or go to the emergency department.  Pager Numbers  - Dr. Nehemiah Massed: [phone]  - Dr. Laurence Ferrari: [phone]  - Dr. Nicole Kindred: [phone]  In the event of inclement weather, please call our main line at [phone] for an update on the status of any delays or closures.  Dermatology Medication Tips: Please keep the boxes that topical medications come in in order to help keep track of the instructions about where and how to use these. Pharmacies typically print the medication instructions only on the boxes and not directly on the medication tubes.   If your medication is too expensive, please contact our office at [phone] option 4 or send [place] a message through [place].   We are unable to tell what your co-pay for medications will be in advance as this is  different depending on your insurance coverage. However, we may be able to find a substitute medication at lower cost or fill out paperwork to get insurance to cover a needed medication.   If a prior authorization is required to get your medication covered by your insurance company, please allow [place] 1-2 business days to complete this process.  Drug prices often vary depending on where the prescription is filled and some pharmacies may offer cheaper prices.  The website www.goodrx.com contains coupons for medications through different pharmacies. The  prices here do not account for what the cost may be with help from insurance (it may be cheaper with your insurance), but the website can give you the price if you did not use any insurance.  - You can print the associated coupon and take it with your prescription to the pharmacy.  - You may also stop by our office during regular business hours and pick up a GoodRx coupon card.  - If you need your prescription sent electronically to a different pharmacy, notify our office through [facility] or by phone at [phone] option 4.

## 2021-02-19 NOTE — Progress Notes (Signed)
   Follow-Up Visit   Subjective  Christina French is a 65 y.o. female who presents for the following: Follow-up (Patient here today for rash follow up at chest. Reports rash at chest is doing better taking zyrtec. Patient here to have sutures removed. ).  The following portions of the chart were reviewed this encounter and updated as appropriate:  Tobacco  Allergies  Meds  Problems  Med Hx  Surg Hx  Fam Hx      Review of Systems: No other skin or systemic complaints except as noted in HPI or Assessment and Plan.   Objective  Well appearing patient in no apparent distress; mood and affect are within normal limits.  A focused examination was performed including right chest. Relevant physical exam findings are noted in the Assessment and Plan.  chest Excoriations at arms, rare erythematous patch  Assessment & Plan  Hives chest  Biopsy proven  Reviewed labs - normal   Reports losing brother last month  Recommend Increasing zyrtec to 2 in morning and 2 at night provided she does not have side effects like dry mouth, difficulty peeing, blurred vision.   Restart hydroxyzine 25 mg tablet as prescribed (to help with sleep and also itch).  Patient instructed to send mychart message or call if condition becomes worse or fails to clear completely   Encounter for Removal of Sutures - Incision site at the right chest is clean, dry and intact - Wound cleansed, sutures removed, wound cleansed and steri strips applied.  - Discussed pathology results showing  right chest lesional urticaria and direct immunofluorescence urticaria,  NEGATIVE FOR IMMUNOREACTANTS - Patient advised to keep steri-strips dry until they fall off. - Scars remodel for a full year. - Once steri-strips fall off, patient can apply over-the-counter silicone scar cream each night to help with scar remodeling if desired. - Patient advised to call with any concerns or if they notice any new or changing  lesions.  Return if symptoms worsen or fail to improve. I, Ruthell Rummage, CMA, am acting as scribe for Forest Gleason, MD.  Documentation: I have reviewed the above documentation for accuracy and completeness, and I agree with the above.  Forest Gleason, MD

## 2021-02-27 ENCOUNTER — Other Ambulatory Visit: Payer: Self-pay

## 2021-02-27 ENCOUNTER — Ambulatory Visit (INDEPENDENT_AMBULATORY_CARE_PROVIDER_SITE_OTHER): Payer: Medicare Other | Admitting: Nurse Practitioner

## 2021-02-27 ENCOUNTER — Encounter: Payer: Self-pay | Admitting: Nurse Practitioner

## 2021-02-27 VITALS — BP 118/70 | HR 71 | Temp 98.4°F | Resp 16 | Ht 61.0 in | Wt 192.6 lb

## 2021-02-27 DIAGNOSIS — L509 Urticaria, unspecified: Secondary | ICD-10-CM | POA: Diagnosis not present

## 2021-02-27 DIAGNOSIS — E871 Hypo-osmolality and hyponatremia: Secondary | ICD-10-CM | POA: Diagnosis not present

## 2021-02-27 DIAGNOSIS — I10 Essential (primary) hypertension: Secondary | ICD-10-CM

## 2021-02-27 DIAGNOSIS — R21 Rash and other nonspecific skin eruption: Secondary | ICD-10-CM

## 2021-02-27 DIAGNOSIS — F3340 Major depressive disorder, recurrent, in remission, unspecified: Secondary | ICD-10-CM

## 2021-02-27 DIAGNOSIS — E1165 Type 2 diabetes mellitus with hyperglycemia: Secondary | ICD-10-CM | POA: Diagnosis not present

## 2021-02-27 LAB — POCT GLYCOSYLATED HEMOGLOBIN (HGB A1C): Hemoglobin A1C: 5.4 % (ref 4.0–5.6)

## 2021-02-27 LAB — POCT CBG (FASTING - GLUCOSE)-MANUAL ENTRY: Glucose Fasting, POC: 131 mg/dL — AB (ref 70–99)

## 2021-02-27 MED ORDER — MONTELUKAST SODIUM 10 MG PO TABS: 10.0000 mg | ORAL_TABLET | Freq: Every day | ORAL | 3 refills | Status: DC

## 2021-02-27 NOTE — Progress Notes (Signed)
[facility] [place], [place] [address]  Internal MEDICINE  Office Visit Note  Patient Name: Christina French  [id number]  [id number]  Date of Service: 02/27/2021  Chief Complaint  Patient presents with   Follow-up    Discuss meds   Diabetes   Hypertension    HPI Robin presents for a follow up visit for diabetes and hypertension. Her blood pressure is well controlled at this time. Her A1C was 5.4 today which is significantly improved from 7.2 in may this year. Her random glucose was also checked in office today which was 131. She did eat breakfast this morning.  She stopped taking rybelsus approx 2 weeks ago due to unwanted side effects.  She sees dermatology and had biopsies taken of the spots of itchy rash and the biopsy confirmed hives. Unsure what is causing this. She will continue to be followed by derm.     Current Medication: Outpatient Encounter Medications as of 02/27/2021  Medication Sig   Accu-Chek Softclix Lancets lancets Use as instructed twice a day Dx E11.65   acetaminophen (TYLENOL) 500 MG tablet Take 1,000 mg by mouth 2 (two) times daily as needed for mild pain or headache.   ALPRAZolam (XANAX) 1 MG tablet Take 1 tablet (1 mg total) by mouth 3 (three) times daily as needed for anxiety.   aspirin EC 81 MG tablet Take 81 mg by mouth daily.   atenolol (TENORMIN) 50 MG tablet TAKE 1 TABLET BY MOUTH TWICE A DAY   augmented betamethasone dipropionate (DIPROLENE) 0.05 % ointment Apply topically 2 (two) times daily.   cetirizine (ZYRTEC) 10 MG tablet Take 1 tablet twice a day for rash. If rash and itch still bothersome and no side effects (like dry mouth, blurry vision, difficulty peeing), increase to 2 tablets twice a day.   Continuous Blood Gluc Receiver (DEXCOM G6 RECEIVER) DEVI Use as directed DX ell.65   Continuous Blood Gluc Sensor (DEXCOM G6 SENSOR) MISC Use every 10 days dx e11.65   Continuous Blood Gluc Transmit (DEXCOM G6 TRANSMITTER) MISC Use  as directed   cyclobenzaprine (FLEXERIL) 10 MG tablet Take 1 tablet (10 mg total) by mouth 3 (three) times daily as needed for muscle spasms.   escitalopram (LEXAPRO) 20 MG tablet Take 20 mg by mouth daily.    famotidine (PEPCID) 20 MG tablet Take 1 tablet (20 mg total) by mouth 2 (two) times daily.   gabapentin (NEURONTIN) 100 MG capsule TAKE ONE CAPSULE BY MOUTH TWICE A DAY   glimepiride (AMARYL) 2 MG tablet Take 1 tablet (2 mg total) by mouth daily before breakfast.   glucose blood (ACCU-CHEK GUIDE) test strip Use as directed twice a strips twice a day E 11.65   hydrOXYzine (ATARAX/VISTARIL) 25 MG tablet Take 25 mg by mouth 3 (three) times daily as needed.   hydrOXYzine (ATARAX/VISTARIL) 25 MG tablet Take 1 tablet every 6-8 hours as needed for itch.  At the bedtime dose, you can take up to 2 tablets. This medication causes drowsiness - avoid driving or operating machinery while taking. Decrease dose if you have any difficulty peeing, blurred vision or bothersome dry mouth.   Lacosamide 150 MG TABS Take 150 mg by mouth 2 (two) times daily.    levETIRAcetam (KEPPRA) 500 MG tablet Take 500 mg by mouth 2 (two) times daily.   lisinopril (ZESTRIL) 5 MG tablet TAKE 1 TABLET BY MOUTH DAILY   metFORMIN (GLUCOPHAGE) 500 MG tablet TAKE 1 TABLET BY MOUTH TWICE A DAY  WITH A MEAL   montelukast (SINGULAIR) 10 MG tablet Take 1 tablet (10 mg total) by mouth at bedtime.   primidone (MYSOLINE) 50 MG tablet Take 50 mg by mouth daily.   rosuvastatin (CRESTOR) 5 MG tablet TAKE 1 TABLET BY MOUTH DAILY   traZODone (DESYREL) 100 MG tablet Take 1 tablet (100 mg total) by mouth at bedtime.   ferrous sulfate 325 (65 FE) MG EC tablet Take 1 tablet (325 mg total) by mouth 2 (two) times daily.   [DISCONTINUED] mupirocin ointment (BACTROBAN) 2 % Apply 1 application topically 2 (two) times daily. (Patient not taking: Reported on 02/27/2021)   [DISCONTINUED] nitrofurantoin, macrocrystal-monohydrate, (MACROBID) 100 MG capsule  Take 1 capsule (100 mg total) by mouth 2 (two) times daily. (Patient not taking: Reported on 02/27/2021)   [DISCONTINUED] omeprazole (PRILOSEC) 40 MG capsule TAKE ONE CAPSULE BY MOUTH TWICE A DAY (Patient not taking: Reported on 02/27/2021)   [DISCONTINUED] RYBELSUS 3 MG TABS TAKE 1 TABLET BY MOUTH DAILY BEFORE BREAKFAST WITH NO MORE THAN 4 OZ OF WATER. WAIT 30 MINUTES BEFORE EATING, DRINKING OR TAKING THE REST OF YOUR MEDS (Patient not taking: Reported on 02/27/2021)   No facility-administered encounter medications on file as of 02/27/2021.    Surgical History: Past Surgical History:  Procedure Laterality Date   ABDOMINAL HYSTERECTOMY     CESAREAN SECTION     COLONOSCOPY N/A 08/31/2014   Procedure: COLONOSCOPY;  Surgeon: Lucilla Lame, MD;  Location: [facility];  Service: Gastroenterology;  Laterality: N/A;   COLONOSCOPY N/A 08/05/2016   Procedure: COLONOSCOPY;  Surgeon: [name], MD;  Location: [facility] ENDOSCOPY;  Service: Endoscopy;  Laterality: N/A;   COLONOSCOPY WITH PROPOFOL N/A 12/22/2018   Procedure: COLONOSCOPY WITH PROPOFOL;  Surgeon: Virgel Manifold, MD;  Location: [facility] ENDOSCOPY;  Service: Endoscopy;  Laterality: N/A;   CYST EXCISION  2011   from throat   ESOPHAGOGASTRODUODENOSCOPY N/A 08/31/2014   Procedure: ESOPHAGOGASTRODUODENOSCOPY (EGD);  Surgeon: Lucilla Lame, MD;  Location: [facility];  Service: Gastroenterology;  Laterality: N/A;   ESOPHAGOGASTRODUODENOSCOPY N/A 12/21/2018   Procedure: ESOPHAGOGASTRODUODENOSCOPY (EGD);  Surgeon: Virgel Manifold, MD;  Location[facility] ENDOSCOPY;  Service: Endoscopy;  Laterality: N/A;   ESOPHAGOGASTRODUODENOSCOPY (EGD) WITH PROPOFOL N/A 07/01/2017   Procedure: ESOPHAGOGASTRODUODENOSCOPY (EGD) WITH PROPOFOL;  Surgeon: [name], MD;  Location[facility] ENDOSCOPY;  Service: Gastroenterology;  Laterality: N/A;   FLEXIBLE SIGMOIDOSCOPY N/A 07/01/2017   Procedure: FLEXIBLE SIGMOIDOSCOPY;  Surgeon: [name], MD;  Location: [facility]  ENDOSCOPY;  Service: Gastroenterology;  Laterality: N/A;   GIVENS CAPSULE STUDY  12/22/2018   Procedure: GIVENS CAPSULE STUDY;  Surgeon: Virgel Manifold, MD;  Location: [facility] ENDOSCOPY;  Service: Endoscopy;;   HEMORRHOID SURGERY     INCISION AND DRAINAGE HIP Right 11/25/2014   Procedure: IRRIGATION  AND DRAINAGEOF RIGHT HIP WITH PLACEMENT OF ANTIBIOUTIC BEADS;  Surgeon: Mcarthur Rossetti, MD;  Location: [facility];  Service: Orthopedics;  Laterality: Right;   INCISION AND DRAINAGE HIP Right 11/28/2014   Procedure: Repeat I&D Right Hip;  Surgeon: Mcarthur Rossetti, MD;  Location: [facility];  Service: Orthopedics;  Laterality: Right;   JOINT REPLACEMENT Right 2007   hip   LOOP RECORDER INSERTION N/A 04/04/2019   Procedure: LOOP RECORDER INSERTION;  Surgeon: Isaias Cowman, MD;  Location: [facility];  Service: Cardiovascular;  Laterality: N/A;   TOTAL HIP REVISION Right 03/19/2015   Procedure: RIGHT TOTAL HIP ARTHROPLASTY REVISION;  Surgeon: Meredith Pel, MD;  Location: [facility];  Service: Orthopedics;  Laterality: Right;   TOTAL HIP REVISION Right 11/18/2015   Procedure: TOTAL HIP REVISION;  Surgeon: Frederik Pear, MD;  Location: [facility];  Service: Orthopedics;  Laterality: Right;    Medical History: Past Medical History:  Diagnosis Date   Anemia    Anxiety    Arthritis    Barrett esophagus    Cancer ([place]) 2016    Skin cancer right leg mylenoma   Depression    Diabetes ([place]) 04/25/2017   GERD (gastroesophageal reflux disease)    History of blood transfusion    with a surgery   History of bronchitis    History of pneumonia    Hypertension    IBS (irritable bowel syndrome)    Pneumonia 09/27/15   PTSD (post-traumatic stress disorder)    Renal insufficiency    reports acute kidney injury   Seizures ([place])    rare - last one 07/30/14 - was very anemic.  Silent seziure -June 2016-    Vertigo    thinks related to sinus issues    Family History: Family History  Problem  Relation Age of Onset   Alcoholism Father    Throat cancer Mother    Breast cancer Maternal Aunt     Social History   Socioeconomic History   Marital status: Single    Spouse name: Not on file   Number of children: Not on file   Years of education: Not on file   Highest education level: Not on file  Occupational History   Not on file  Tobacco Use   Smoking status: Never   Smokeless tobacco: Never  Vaping Use   Vaping Use: Never used  Substance and Sexual Activity   Alcohol use: No    Alcohol/week: 0.0 standard drinks   Drug use: No   Sexual activity: Not Currently  Other Topics Concern   Not on file  Social History Narrative   Not on file   Social Determinants of Health   Financial Resource Strain: Not on file  Food Insecurity: Not on file  Transportation Needs: Not on file  Physical Activity: Not on file  Stress: Not on file  Social Connections: Not on file  Intimate Partner Violence: Not on file      Review of Systems  Constitutional:  Negative for chills, fatigue and unexpected weight change.  HENT:  Negative for congestion, rhinorrhea, sneezing and sore throat.   Eyes:  Negative for redness.  Respiratory:  Negative for cough, chest tightness and shortness of breath.   Cardiovascular:  Negative for chest pain and palpitations.  Gastrointestinal:  Negative for abdominal pain, constipation, diarrhea, nausea and vomiting.  Genitourinary:  Negative for dysuria and frequency.  Musculoskeletal:  Negative for arthralgias, back pain, joint swelling and neck pain.  Skin:  Negative for rash.  Neurological: Negative.  Negative for tremors and numbness.  Hematological:  Negative for adenopathy. Does not bruise/bleed easily.  Psychiatric/Behavioral:  Negative for behavioral problems (Depression), sleep disturbance and suicidal ideas. The patient is not nervous/anxious.    Vital Signs: BP 118/70   Pulse 71   Temp 98.4 F (36.9 C)   Resp 16   Ht 5\' 1"  (1.549 m)    Wt 192 lb 9.6 oz (87.4 kg)   SpO2 97%   BMI 36.39 kg/m    Physical Exam Vitals reviewed.  Constitutional:      General: She is not in acute distress.    Appearance: Normal appearance. She is obese. She is not ill-appearing.  HENT:  Head: Normocephalic and atraumatic.  Eyes:     Pupils: Pupils are equal, round, and reactive to light.  Cardiovascular:     Rate and Rhythm: Normal rate and regular rhythm.  Pulmonary:     Effort: Pulmonary effort is normal. No respiratory distress.  Neurological:     Mental Status: She is alert and oriented to person, place, and time.     Cranial Nerves: No cranial nerve deficit.     Coordination: Coordination normal.     Gait: Gait normal.  Psychiatric:        Mood and Affect: Mood normal.        Behavior: Behavior normal.       Assessment/Plan: 1. Type 2 diabetes mellitus with hyperglycemia, without long-term current use of insulin (HCC) A1c significantly improved. She stopped taking rybelsus due to unwanted side effects. She continues to take metformin and glimepiride as prescribed. Uses dexcom.  - POCT HgB A1C - POCT CBG (Fasting - Glucose)  2. Essential hypertension Blood pressure is stable with current medications.   3. Chronic hyponatremia Last sodium level was 130 in October, patient has standing orders for her sodium level to be checked at least every 2 months.   4. Urticaria Montelukast prescribed to alleviate urticaria.  - montelukast (SINGULAIR) 10 MG tablet; Take 1 tablet (10 mg total) by mouth at bedtime.  Dispense: 30 tablet; Refill: 3  5. Major depressive disorder, recurrent, in remission ([place]) Stable on escitalopram.    General Counseling: pamella samons understanding of the findings of todays visit and agrees with plan of treatment. I have discussed any further diagnostic evaluation that may be needed or ordered today. We also reviewed her medications today. she has been encouraged to call the office with any  questions or concerns that should arise related to todays visit.    Orders Placed This Encounter  Procedures   POCT HgB A1C   POCT CBG (Fasting - Glucose)    Meds ordered this encounter  Medications   montelukast (SINGULAIR) 10 MG tablet    Sig: Take 1 tablet (10 mg total) by mouth at bedtime.    Dispense:  30 tablet    Refill:  3    Return in about 1 month (around 03/29/2021) for F/U, eval new med, Koden Hunzeker PCP and also needs appt for A1C check in 3 months.   Total time spent:30 Minutes Time spent includes review of chart, medications, test results, and follow up plan with the patient.   [place] Controlled Substance Database was reviewed by me.  This patient was seen by Jonetta Osgood, FNP-C in collaboration with Dr. Clayborn Bigness as a part of collaborative care agreement.   Jaquez Farrington R. Valetta Fuller, MSN, FNP-C Internal medicine

## 2021-03-04 DIAGNOSIS — E1169 Type 2 diabetes mellitus with other specified complication: Secondary | ICD-10-CM | POA: Diagnosis not present

## 2021-03-04 DIAGNOSIS — R569 Unspecified convulsions: Secondary | ICD-10-CM | POA: Diagnosis not present

## 2021-03-04 DIAGNOSIS — G250 Essential tremor: Secondary | ICD-10-CM | POA: Diagnosis not present

## 2021-03-04 DIAGNOSIS — G4733 Obstructive sleep apnea (adult) (pediatric): Secondary | ICD-10-CM | POA: Diagnosis not present

## 2021-03-04 DIAGNOSIS — L299 Pruritus, unspecified: Secondary | ICD-10-CM | POA: Diagnosis not present

## 2021-03-28 ENCOUNTER — Ambulatory Visit: Payer: Medicare Other | Admitting: Nurse Practitioner

## 2021-03-29 ENCOUNTER — Encounter: Payer: Self-pay | Admitting: Nurse Practitioner

## 2021-04-01 ENCOUNTER — Other Ambulatory Visit: Payer: Self-pay

## 2021-04-01 ENCOUNTER — Ambulatory Visit (INDEPENDENT_AMBULATORY_CARE_PROVIDER_SITE_OTHER): Payer: Medicare Other | Admitting: Nurse Practitioner

## 2021-04-01 ENCOUNTER — Encounter: Payer: Self-pay | Admitting: Nurse Practitioner

## 2021-04-01 ENCOUNTER — Telehealth: Payer: Self-pay

## 2021-04-01 VITALS — BP 140/81 | HR 81 | Temp 98.1°F | Resp 16 | Ht 61.0 in | Wt 190.8 lb

## 2021-04-01 DIAGNOSIS — E871 Hypo-osmolality and hyponatremia: Secondary | ICD-10-CM | POA: Diagnosis not present

## 2021-04-01 DIAGNOSIS — L509 Urticaria, unspecified: Secondary | ICD-10-CM | POA: Diagnosis not present

## 2021-04-01 DIAGNOSIS — I10 Essential (primary) hypertension: Secondary | ICD-10-CM

## 2021-04-01 NOTE — Progress Notes (Signed)
[facility] [place], [place] [address]  Internal MEDICINE  Office Visit Note  Patient Name: Christina French  [id number]  [id number]  Date of Service: 04/01/2021  Chief Complaint  Patient presents with   Follow-up    Wants allergy testing, break out of hives and they keep coming back in the same spots    Diabetes   Depression   Gastroesophageal Reflux   Hypertension   Anemia   Anxiety    HPI Christina French presents for a follow up visit for wanting allergy testing ordered. She has been having outbreaks of hives with no known cause. She does not have any pets at home and has not been in contact with any plants in or outside of the home. She has not had any new medications.  The hives keep coming back in the same places.     Current Medication: Outpatient Encounter Medications as of 04/01/2021  Medication Sig   Accu-Chek Softclix Lancets lancets Use as instructed twice a day Dx E11.65   acetaminophen (TYLENOL) 500 MG tablet Take 1,000 mg by mouth 2 (two) times daily as needed for mild pain or headache.   ALPRAZolam (XANAX) 1 MG tablet Take 1 tablet (1 mg total) by mouth 3 (three) times daily as needed for anxiety.   augmented betamethasone dipropionate (DIPROLENE) 0.05 % ointment Apply topically 2 (two) times daily.   Continuous Blood Gluc Receiver (DEXCOM G6 RECEIVER) DEVI Use as directed DX ell.65   Continuous Blood Gluc Sensor (DEXCOM G6 SENSOR) MISC Use every 10 days dx e11.65   Continuous Blood Gluc Transmit (DEXCOM G6 TRANSMITTER) MISC Use as directed   cyclobenzaprine (FLEXERIL) 10 MG tablet Take 1 tablet (10 mg total) by mouth 3 (three) times daily as needed for muscle spasms.   escitalopram (LEXAPRO) 20 MG tablet Take 20 mg by mouth daily.    glimepiride (AMARYL) 2 MG tablet Take 1 tablet (2 mg total) by mouth daily before breakfast.   glucose blood (ACCU-CHEK GUIDE) test strip Use as directed twice a strips twice a day E 11.65   Lacosamide 150 MG TABS Take  150 mg by mouth 2 (two) times daily.    levETIRAcetam (KEPPRA) 500 MG tablet Take 500 mg by mouth 2 (two) times daily.   lisinopril (ZESTRIL) 5 MG tablet TAKE 1 TABLET BY MOUTH DAILY   metFORMIN (GLUCOPHAGE) 500 MG tablet TAKE 1 TABLET BY MOUTH TWICE A DAY WITH A MEAL   montelukast (SINGULAIR) 10 MG tablet Take 1 tablet (10 mg total) by mouth at bedtime.   rosuvastatin (CRESTOR) 5 MG tablet TAKE 1 TABLET BY MOUTH DAILY   traZODone (DESYREL) 100 MG tablet Take 1 tablet (100 mg total) by mouth at bedtime.   [DISCONTINUED] aspirin EC 81 MG tablet Take 81 mg by mouth daily.   [DISCONTINUED] atenolol (TENORMIN) 50 MG tablet TAKE 1 TABLET BY MOUTH TWICE A DAY   [DISCONTINUED] cetirizine (ZYRTEC) 10 MG tablet Take 1 tablet twice a day for rash. If rash and itch still bothersome and no side effects (like dry mouth, blurry vision, difficulty peeing), increase to 2 tablets twice a day.   [DISCONTINUED] famotidine (PEPCID) 20 MG tablet Take 1 tablet (20 mg total) by mouth 2 (two) times daily.   [DISCONTINUED] gabapentin (NEURONTIN) 100 MG capsule TAKE ONE CAPSULE BY MOUTH TWICE A DAY   ferrous sulfate 325 (65 FE) MG EC tablet Take 1 tablet (325 mg total) by mouth 2 (two) times daily.   No facility-administered  encounter medications on file as of 04/01/2021.    Surgical History: Past Surgical History:  Procedure Laterality Date   ABDOMINAL HYSTERECTOMY     CESAREAN SECTION     COLONOSCOPY N/A 08/31/2014   Procedure: COLONOSCOPY;  Surgeon: Lucilla Lame, MD;  Location: [place];  Service: Gastroenterology;  Laterality: N/A;   COLONOSCOPY N/A 08/05/2016   Procedure: COLONOSCOPY;  Surgeon: [name], MD;  Location: [facility] ENDOSCOPY;  Service: Endoscopy;  Laterality: N/A;   COLONOSCOPY WITH PROPOFOL N/A 12/22/2018   Procedure: COLONOSCOPY WITH PROPOFOL;  Surgeon: Virgel Manifold, MD;  Location: [facility] ENDOSCOPY;  Service: Endoscopy;  Laterality: N/A;   CYST EXCISION  2011   from throat    ESOPHAGOGASTRODUODENOSCOPY N/A 08/31/2014   Procedure: ESOPHAGOGASTRODUODENOSCOPY (EGD);  Surgeon: Lucilla Lame, MD;  Location: [place];  Service: Gastroenterology;  Laterality: N/A;   ESOPHAGOGASTRODUODENOSCOPY N/A 12/21/2018   Procedure: ESOPHAGOGASTRODUODENOSCOPY (EGD);  Surgeon: Virgel Manifold, MD;  Location: [facility] ENDOSCOPY;  Service: Endoscopy;  Laterality: N/A;   ESOPHAGOGASTRODUODENOSCOPY (EGD) WITH PROPOFOL N/A 07/01/2017   Procedure: ESOPHAGOGASTRODUODENOSCOPY (EGD) WITH PROPOFOL;  Surgeon: [name], MD;  Location: [facility] ENDOSCOPY;  Service: Gastroenterology;  Laterality: N/A;   FLEXIBLE SIGMOIDOSCOPY N/A 07/01/2017   Procedure: FLEXIBLE SIGMOIDOSCOPY;  Surgeon: [name], MD;  [facility];  Service: Gastroenterology;  Laterality: N/A;   GIVENS CAPSULE STUDY  12/22/2018   Procedure: GIVENS CAPSULE STUDY;  Surgeon: Virgel Manifold, MD;  Location: [facility] ENDOSCOPY;  Service: Endoscopy;;   HEMORRHOID SURGERY     INCISION AND DRAINAGE HIP Right 11/25/2014   Procedure: IRRIGATION  AND DRAINAGEOF RIGHT HIP WITH PLACEMENT OF ANTIBIOUTIC BEADS;  Surgeon: Mcarthur Rossetti, MD;  Location: [place];  Service: Orthopedics;  Laterality: Right;   INCISION AND DRAINAGE HIP Right 11/28/2014   Procedure: Repeat I&D Right Hip;  Surgeon: Mcarthur Rossetti, MD;  Location: [place];  Service: Orthopedics;  Laterality: Right;   JOINT REPLACEMENT Right 2007   hip   LOOP RECORDER INSERTION N/A 04/04/2019   Procedure: LOOP RECORDER INSERTION;  Surgeon: Isaias Cowman, MD;  Location: [place] [facility];  Service: Cardiovascular;  Laterality: N/A;   TOTAL HIP REVISION Right 03/19/2015   Procedure: RIGHT TOTAL HIP ARTHROPLASTY REVISION;  Surgeon: Meredith Pel, MD;  Location: [place];  Service: Orthopedics;  Laterality: Right;   TOTAL HIP REVISION Right 11/18/2015   Procedure: TOTAL HIP REVISION;  Surgeon: Frederik Pear, MD;  Location: [place];  Service: Orthopedics;   Laterality: Right;    Medical History: Past Medical History:  Diagnosis Date   Anemia    Anxiety    Arthritis    Barrett esophagus    Cancer ([place]) 2016    Skin cancer right leg mylenoma   Depression    Diabetes ([place]) 04/25/2017   GERD (gastroesophageal reflux disease)    History of blood transfusion    with a surgery   History of bronchitis    History of pneumonia    Hypertension    IBS (irritable bowel syndrome)    Pneumonia 09/27/15   PTSD (post-traumatic stress disorder)    Renal insufficiency    reports acute kidney injury   Seizures ([place])    rare - last one 07/30/14 - was very anemic.  Silent seziure -June 2016-    Vertigo    thinks related to sinus issues    Family History: Family History  Problem Relation Age of Onset   Alcoholism Father    Throat cancer Mother  Breast cancer Maternal Aunt     Social History   Socioeconomic History   Marital status: Single    Spouse name: Not on file   Number of children: Not on file   Years of education: Not on file   Highest education level: Not on file  Occupational History   Not on file  Tobacco Use   Smoking status: Never   Smokeless tobacco: Never  Vaping Use   Vaping Use: Never used  Substance and Sexual Activity   Alcohol use: No    Alcohol/week: 0.0 standard drinks   Drug use: No   Sexual activity: Not Currently  Other Topics Concern   Not on file  Social History Narrative   Not on file   Social Determinants of Health   Financial Resource Strain: Not on file  Food Insecurity: Not on file  Transportation Needs: Not on file  Physical Activity: Not on file  Stress: Not on file  Social Connections: Not on file  Intimate Partner Violence: Not on file      Review of Systems  Constitutional:  Negative for fatigue and fever.  HENT:  Negative for congestion, mouth sores and postnasal drip.   Respiratory:  Negative for cough.   Cardiovascular:  Negative for chest pain.  Genitourinary:  Negative for  flank pain.  Skin:  Positive for rash.  Psychiatric/Behavioral: Negative.     Vital Signs: BP 140/81   Pulse 81   Temp 98.1 F (36.7 C)   Resp 16   Ht 5\' 1"  (1.549 m)   Wt 190 lb 12.8 oz (86.5 kg)   SpO2 98%   BMI 36.05 kg/m    Physical Exam Constitutional:      Appearance: Normal appearance.  HENT:     Head: Normocephalic and atraumatic.     Nose: Nose normal.     Mouth/Throat:     Mouth: Mucous membranes are moist.     Pharynx: No posterior oropharyngeal erythema.  Eyes:     Extraocular Movements: Extraocular movements intact.     Pupils: Pupils are equal, round, and reactive to light.  Cardiovascular:     Pulses: Normal pulses.     Heart sounds: Normal heart sounds.  Pulmonary:     Effort: Pulmonary effort is normal.     Breath sounds: Normal breath sounds.  Skin:    Findings: Lesion and rash present.     Comments: Multiple areas of excoriation is seen on her back, multiple it is involved with rash questionable drug related rash  Neurological:     General: No focal deficit present.     Mental Status: She is alert.  Psychiatric:        Mood and Affect: Mood normal.        Behavior: Behavior normal.       Assessment/Plan: 1. Essential hypertension Stable BP, no change in current medications.   2. Urticaria Wants allergy testing. Will also have blood drawn for food allergies. Will have an appt scheduled with Dr. Devona Konig for allergy testing.  - Allergy Test - Food Allergy Profile  3. Chronic hyponatremia Standing orders for sodium level every 8 weeks. She has been hospitalized for this problem in the past. Will continue to monitor.    General Counseling: shenay torti understanding of the findings of todays visit and agrees with plan of treatment. I have discussed any further diagnostic evaluation that may be needed or ordered today. We also reviewed her medications today. she has been encouraged  to call the office with any questions or concerns that  should arise related to todays visit.    Orders Placed This Encounter  Procedures   Allergy Test   Food Allergy Profile    No orders of the defined types were placed in this encounter.   Return in about 1 month (around 05/02/2021) for F/U, Allergy testing @ [facility].   Total time spent:30 Minutes Time spent includes review of chart, medications, test results, and follow up plan with the patient.   [place] Controlled Substance Database was reviewed by me.  This patient was seen by Jonetta Osgood, FNP-C in collaboration with Dr. Clayborn Bigness as a part of collaborative care agreement.   Vivi Piccirilli R. Valetta Fuller, MSN, FNP-C Internal medicine

## 2021-04-01 NOTE — Telephone Encounter (Signed)
Lvm to confirm today's appointment-Toni ?

## 2021-04-08 ENCOUNTER — Other Ambulatory Visit: Payer: Self-pay | Admitting: Internal Medicine

## 2021-04-08 ENCOUNTER — Other Ambulatory Visit: Payer: Self-pay | Admitting: Dermatology

## 2021-04-08 DIAGNOSIS — M792 Neuralgia and neuritis, unspecified: Secondary | ICD-10-CM

## 2021-04-08 NOTE — Telephone Encounter (Signed)
Please confirm she is taking zyrtec 2 in the am and 2 in the pm? She was doing well at her last clinic visit and they did not call to let me know the hives had worsened. If allergy testing does not show anything, there are some additional labs I would recommend ordering. Also, she may need Xolair, a once a month injection for urticaria that works quite well. That can be prescribed and administered by either dermatology or allergy.

## 2021-04-08 NOTE — Telephone Encounter (Signed)
Spoke with patient's daughter. She has not had any improvement with the hives. She seen PCP recently and is being referred to have allergy testing done by an allergist.

## 2021-04-08 NOTE — Telephone Encounter (Signed)
LM on VM please call here to discuss refill sent over from the Pharmacy for Pepcid,   However, if hives are well-controlled, she can try stopping the famotidine and see if it stays controlled without it.

## 2021-04-09 NOTE — Telephone Encounter (Signed)
Left msg for patient to return my call

## 2021-04-10 NOTE — Telephone Encounter (Signed)
Spoke with patient. She states she is much better. She states she is taking the Zyrtec and has not itched in several days. She is going for allergy testing. If this has no results then I did advise her to call our office so we can let Dr. Laurence Ferrari know.

## 2021-04-22 ENCOUNTER — Other Ambulatory Visit: Payer: Self-pay | Admitting: Dermatology

## 2021-04-22 ENCOUNTER — Other Ambulatory Visit: Payer: Self-pay | Admitting: Nurse Practitioner

## 2021-05-14 ENCOUNTER — Telehealth: Payer: Self-pay

## 2021-05-14 ENCOUNTER — Other Ambulatory Visit: Payer: Self-pay

## 2021-05-14 MED ORDER — PANTOPRAZOLE SODIUM 40 MG PO TBEC: 40.0000 mg | DELAYED_RELEASE_TABLET | Freq: Every day | ORAL | 1 refills | Status: DC

## 2021-05-14 NOTE — Telephone Encounter (Signed)
Med sent, spoke to pt, she is aware

## 2021-05-16 DIAGNOSIS — R0989 Other specified symptoms and signs involving the circulatory and respiratory systems: Secondary | ICD-10-CM | POA: Diagnosis not present

## 2021-05-16 DIAGNOSIS — G4733 Obstructive sleep apnea (adult) (pediatric): Secondary | ICD-10-CM | POA: Diagnosis not present

## 2021-05-16 DIAGNOSIS — R0602 Shortness of breath: Secondary | ICD-10-CM | POA: Diagnosis not present

## 2021-05-16 DIAGNOSIS — R600 Localized edema: Secondary | ICD-10-CM | POA: Diagnosis not present

## 2021-05-16 DIAGNOSIS — E1169 Type 2 diabetes mellitus with other specified complication: Secondary | ICD-10-CM | POA: Diagnosis not present

## 2021-05-16 DIAGNOSIS — I10 Essential (primary) hypertension: Secondary | ICD-10-CM | POA: Diagnosis not present

## 2021-05-16 DIAGNOSIS — Z23 Encounter for immunization: Secondary | ICD-10-CM | POA: Diagnosis not present

## 2021-05-21 ENCOUNTER — Other Ambulatory Visit: Payer: Self-pay | Admitting: Internal Medicine

## 2021-05-21 DIAGNOSIS — E1165 Type 2 diabetes mellitus with hyperglycemia: Secondary | ICD-10-CM

## 2021-05-25 IMAGING — MG DIGITAL SCREENING BILAT W/ TOMO W/ CAD
8 series · 8 of 24 positions shown · non-contrast
Comparison: Previous exam(s).

CLINICAL DATA: Screening.

EXAM:
DIGITAL SCREENING BILATERAL MAMMOGRAM WITH TOMO AND CAD

[R MLO synth-2D]
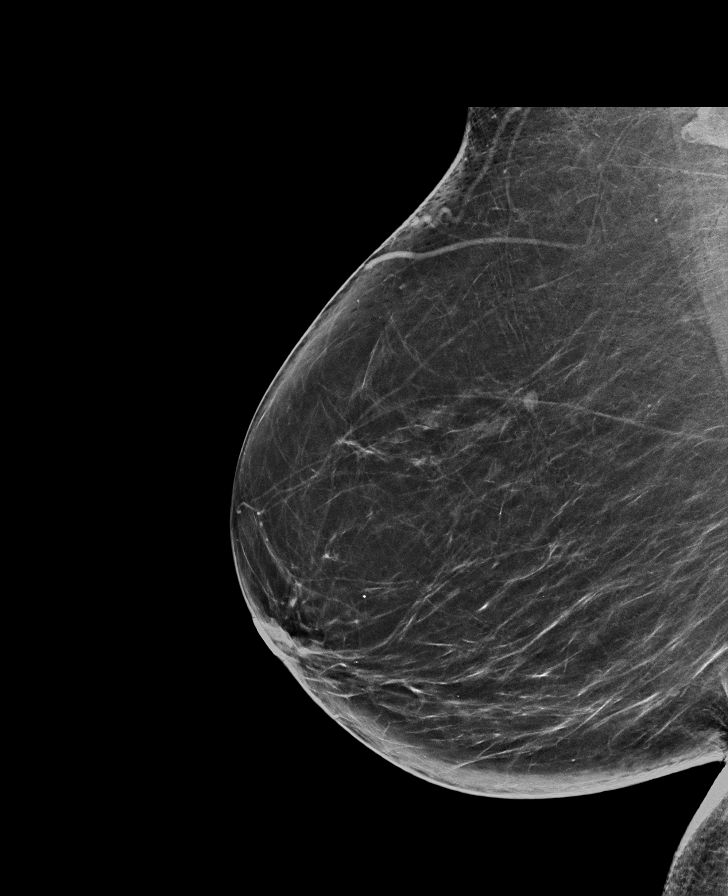

[L MLO synth-2D]
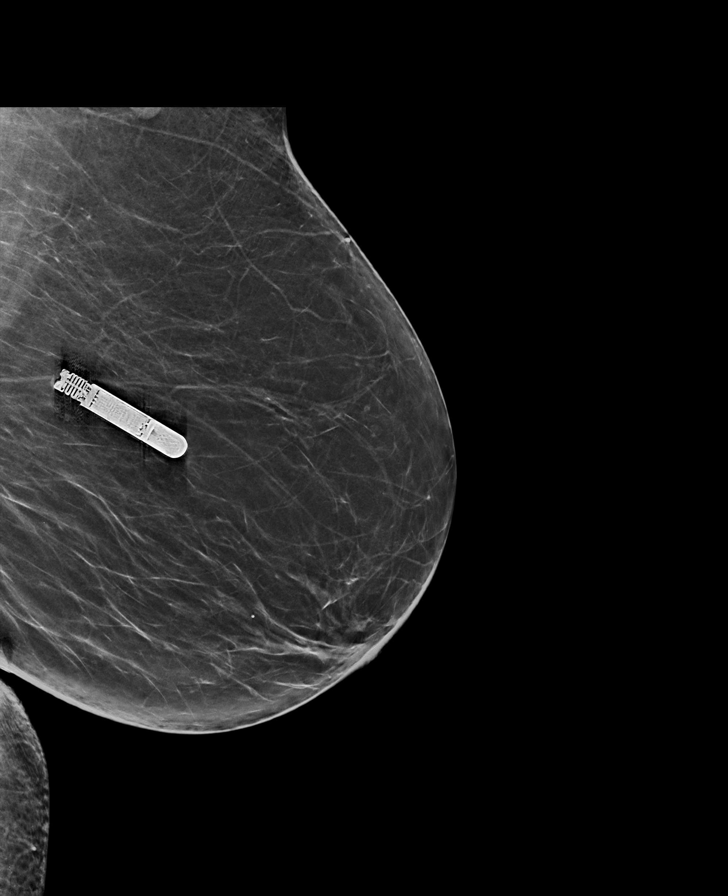

[L CC synth-2D]
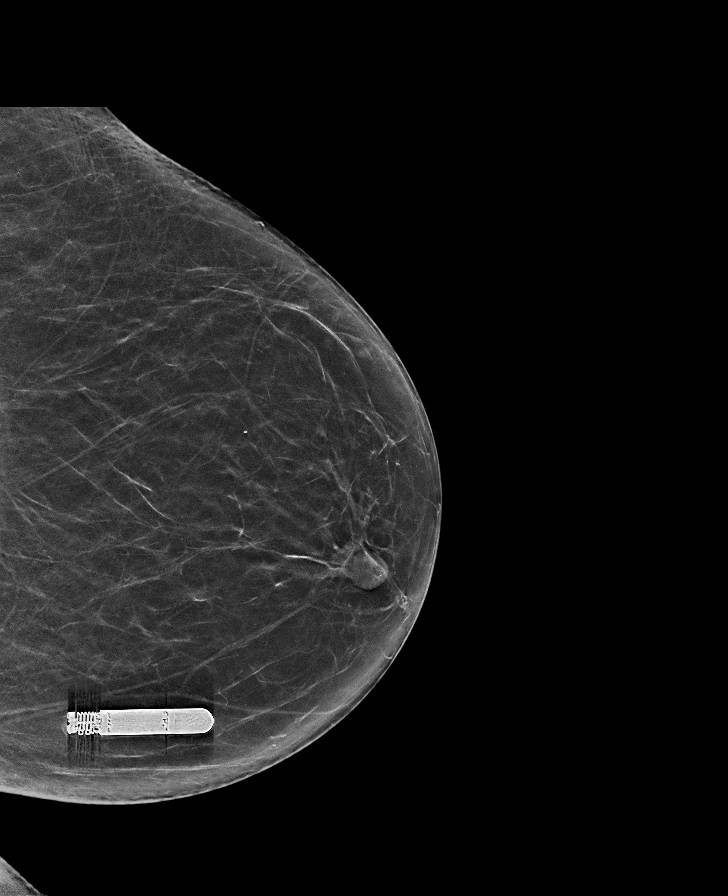

[R CC synth-2D]
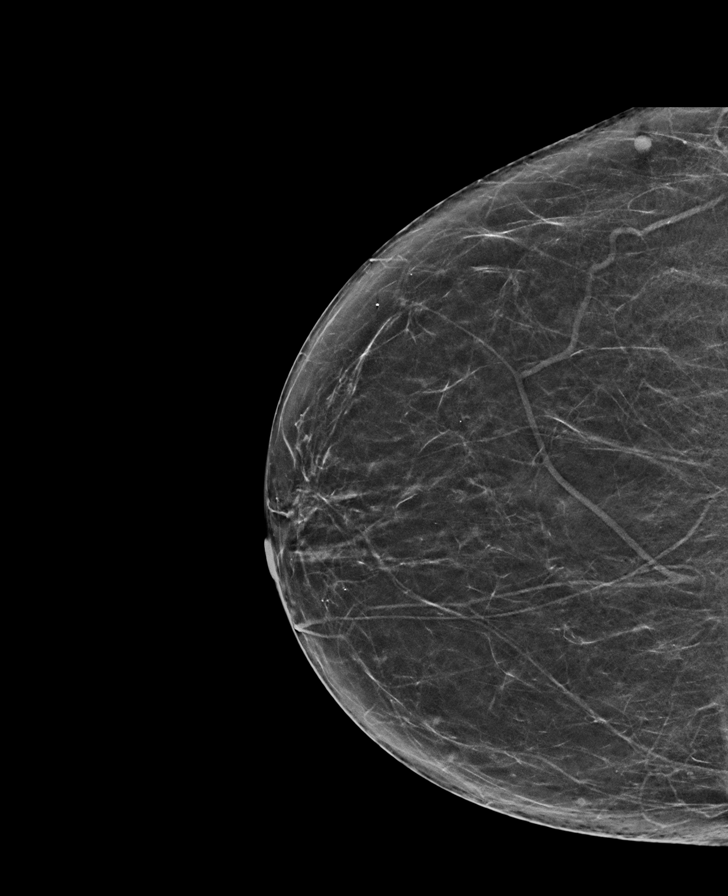

[R CC tomo · tomo slice 29/58.0]
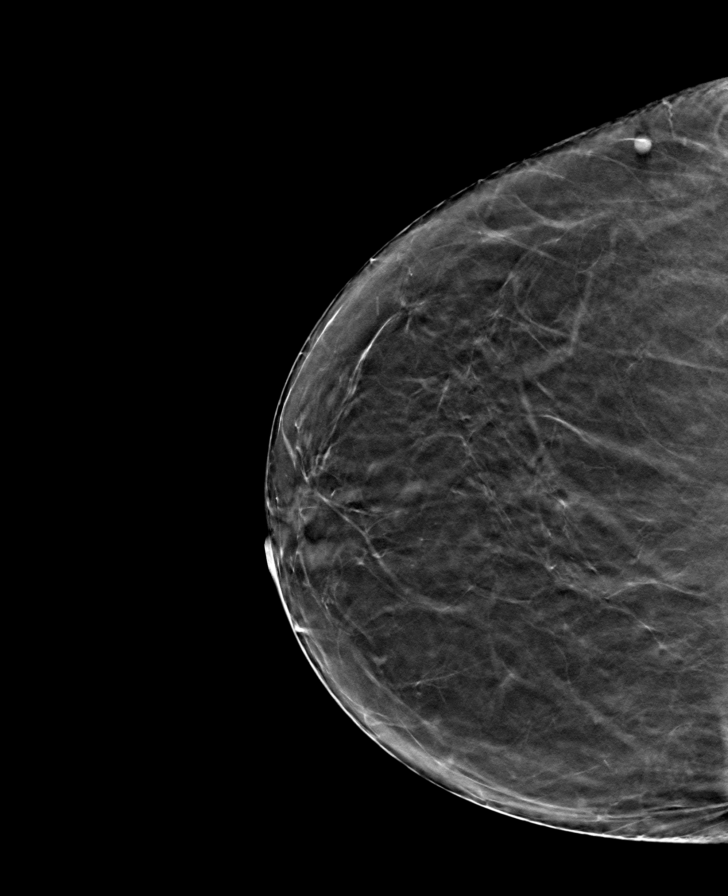

[R MLO tomo · tomo slice 35/70.0]
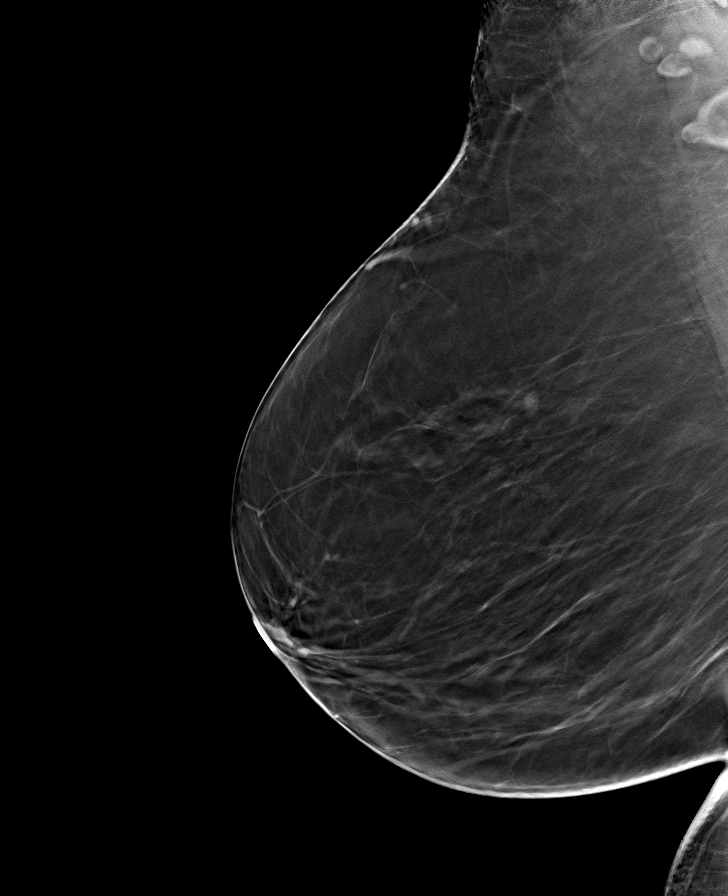

[L MLO tomo · tomo slice 33/66.0]
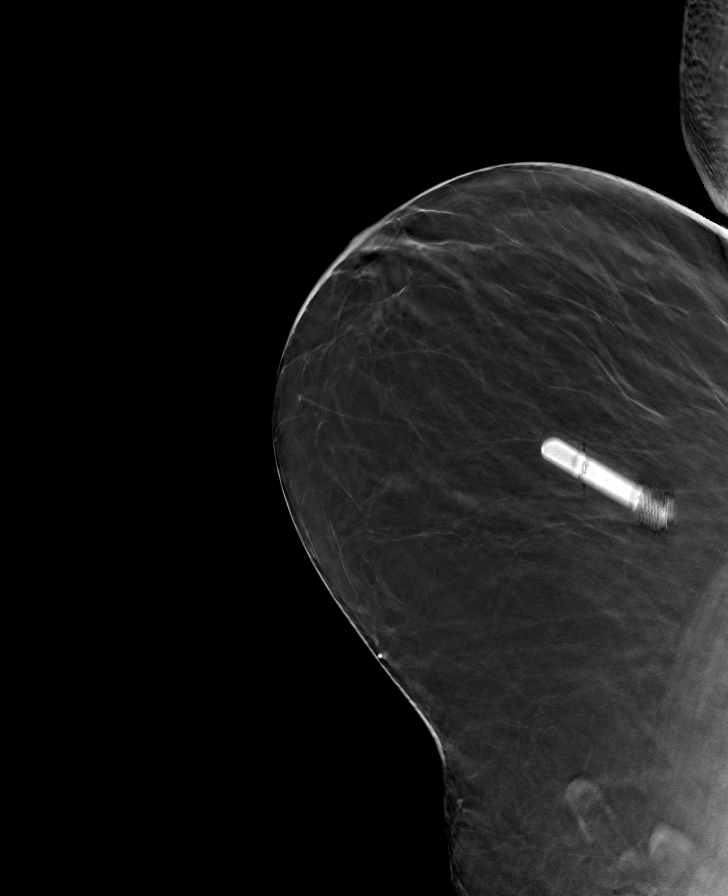

[L CC tomo · tomo slice 32/63.0]
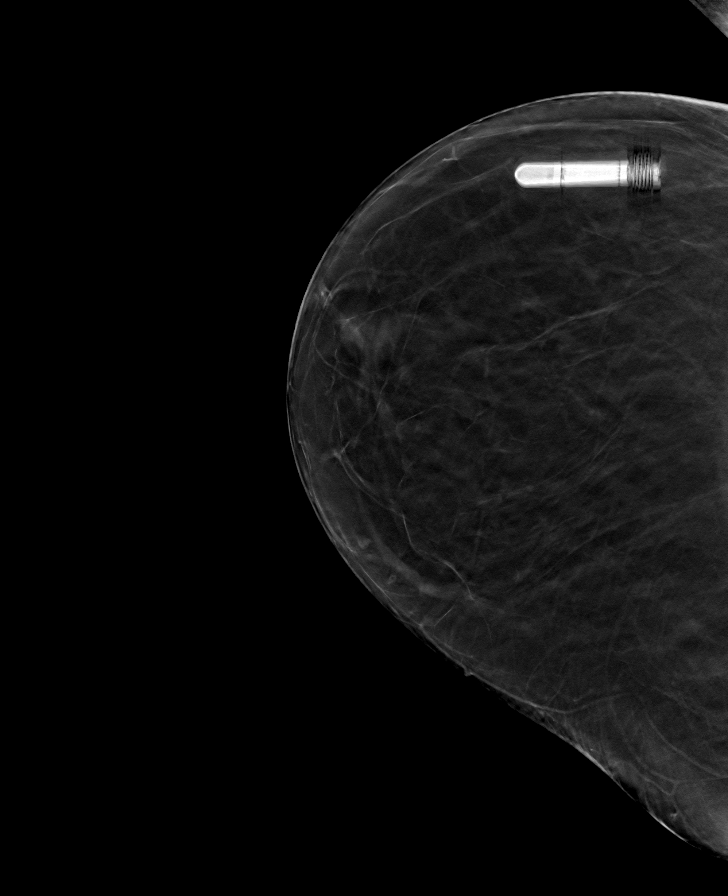

[8 of 24 positions shown; findings below may reference images not displayed]

ACR Breast Density Category b: There are scattered areas of
fibroglandular density.
FINDINGS: There are no findings suspicious for malignancy. Images were
processed with CAD.
IMPRESSION: No mammographic evidence of malignancy. A result letter of this
screening mammogram will be mailed directly to the patient.

RECOMMENDATION:
Screening mammogram in one year. (Code:[id number])

BI-RADS CATEGORY  1: Negative.

## 2021-05-26 ENCOUNTER — Other Ambulatory Visit: Payer: Self-pay | Admitting: Nurse Practitioner

## 2021-05-26 ENCOUNTER — Other Ambulatory Visit: Payer: Self-pay | Admitting: Dermatology

## 2021-05-26 DIAGNOSIS — M792 Neuralgia and neuritis, unspecified: Secondary | ICD-10-CM

## 2021-05-27 ENCOUNTER — Emergency Department (HOSPITAL_COMMUNITY): Payer: Medicare Other

## 2021-05-27 ENCOUNTER — Emergency Department (HOSPITAL_COMMUNITY)
Admission: EM | Admit: 2021-05-27 | Discharge: 2021-05-28 | Discharge status: Against Medical Advice | Payer: Medicare Other | Attending: Emergency Medicine | Admitting: Emergency Medicine

## 2021-05-27 DIAGNOSIS — R42 Dizziness and giddiness: Secondary | ICD-10-CM | POA: Diagnosis not present

## 2021-05-27 DIAGNOSIS — S0993XA Unspecified injury of face, initial encounter: Secondary | ICD-10-CM | POA: Diagnosis not present

## 2021-05-27 DIAGNOSIS — R0602 Shortness of breath: Secondary | ICD-10-CM | POA: Insufficient documentation

## 2021-05-27 DIAGNOSIS — Z5321 Procedure and treatment not carried out due to patient leaving prior to being seen by health care provider: Secondary | ICD-10-CM | POA: Diagnosis not present

## 2021-05-27 DIAGNOSIS — R569 Unspecified convulsions: Secondary | ICD-10-CM | POA: Diagnosis not present

## 2021-05-27 LAB — CBC
HCT: 26.3 % — ABNORMAL LOW (ref 36.0–46.0)
Hemoglobin: 7.4 g/dL — ABNORMAL LOW (ref 12.0–15.0)
MCH: 21.1 pg — ABNORMAL LOW (ref 26.0–34.0)
MCHC: 28.1 g/dL — ABNORMAL LOW (ref 30.0–36.0)
MCV: 74.9 fL — ABNORMAL LOW (ref 80.0–100.0)
Platelets: 225 10*3/uL (ref 150–400)
RBC: 3.51 MIL/uL — ABNORMAL LOW (ref 3.87–5.11)
RDW: 20.1 % — ABNORMAL HIGH (ref 11.5–15.5)
WBC: 8.5 10*3/uL (ref 4.0–10.5)
nRBC: 0 % (ref 0.0–0.2)

## 2021-05-27 LAB — BASIC METABOLIC PANEL
Anion gap: 8 (ref 5–15)
BUN: 12 mg/dL (ref 8–23)
CO2: 23 mmol/L (ref 22–32)
Calcium: 9.2 mg/dL (ref 8.9–10.3)
Chloride: 98 mmol/L (ref 98–111)
Creatinine, Ser: 0.98 mg/dL (ref 0.44–1.00)
GFR, Estimated: >60 mL/min (ref >60)
Glucose, Bld: 146 mg/dL — ABNORMAL HIGH (ref 70–99)
Potassium: 3.9 mmol/L (ref 3.5–5.1)
Sodium: 129 mmol/L — ABNORMAL LOW (ref 135–145)

## 2021-05-27 LAB — CBG MONITORING, ED: Glucose-Capillary: 157 mg/dL — ABNORMAL HIGH (ref 70–99)

## 2021-05-27 NOTE — ED Triage Notes (Signed)
Pt to ED after seizure, on phone w family when it happened. Known seizure hx, compliant w all home medications. Pt also c/o SHOB, weakness x2wks, scheduled for CTA & echo Thursday but seizure prompted quicker eval

## 2021-05-28 MED ORDER — IOHEXOL 350 MG/ML SOLN
73.0000 mL | Freq: Once | INTRAVENOUS | Status: AC | PRN
  Administered 2021-05-28: 73 mL via INTRAVENOUS

## 2021-05-28 MED ORDER — PANTOPRAZOLE SODIUM 40 MG IV SOLR: 40.0000 mg | Freq: Once | INTRAVENOUS | Status: DC

## 2021-05-28 NOTE — ED Notes (Signed)
No answer in lobby.

## 2021-05-28 NOTE — ED Notes (Signed)
Pt sort desk, states she is do to take seizure med,

## 2021-05-29 DIAGNOSIS — R600 Localized edema: Secondary | ICD-10-CM | POA: Diagnosis not present

## 2021-05-30 ENCOUNTER — Ambulatory Visit: Payer: Medicare Other | Admitting: Nurse Practitioner

## 2021-06-02 ENCOUNTER — Ambulatory Visit: Payer: Medicare Other | Admitting: Nurse Practitioner

## 2021-06-05 ENCOUNTER — Ambulatory Visit: Payer: Medicare Other | Admitting: Nurse Practitioner

## 2021-06-05 DIAGNOSIS — Z23 Encounter for immunization: Secondary | ICD-10-CM | POA: Diagnosis not present

## 2021-06-13 ENCOUNTER — Ambulatory Visit (INDEPENDENT_AMBULATORY_CARE_PROVIDER_SITE_OTHER): Payer: Medicare Other | Admitting: Nurse Practitioner

## 2021-06-13 ENCOUNTER — Other Ambulatory Visit: Payer: Self-pay

## 2021-06-13 ENCOUNTER — Telehealth: Payer: Self-pay

## 2021-06-13 ENCOUNTER — Encounter: Payer: Self-pay | Admitting: Internal Medicine

## 2021-06-13 ENCOUNTER — Inpatient Hospital Stay
Admission: AD | Admit: 2021-06-13 | Discharge: 2021-06-17 | DRG: 348 | Disposition: A | Discharge status: Home / Self Care | Payer: Medicare Other | Source: Ambulatory Visit | Attending: Internal Medicine | Admitting: Internal Medicine

## 2021-06-13 ENCOUNTER — Encounter: Payer: Self-pay | Admitting: Nurse Practitioner

## 2021-06-13 ENCOUNTER — Other Ambulatory Visit
Admission: RE | Admit: 2021-06-13 | Discharge: 2021-06-13 | Disposition: A | Discharge status: Home / Self Care | Payer: Medicare Other | Source: Home / Self Care | Attending: Nurse Practitioner | Admitting: Nurse Practitioner

## 2021-06-13 VITALS — BP 156/82 | HR 91 | Temp 98.3°F | Resp 16 | Ht 61.0 in | Wt 178.2 lb

## 2021-06-13 DIAGNOSIS — G8929 Other chronic pain: Secondary | ICD-10-CM

## 2021-06-13 DIAGNOSIS — K922 Gastrointestinal hemorrhage, unspecified: Secondary | ICD-10-CM | POA: Diagnosis not present

## 2021-06-13 DIAGNOSIS — I10 Essential (primary) hypertension: Secondary | ICD-10-CM | POA: Diagnosis not present

## 2021-06-13 DIAGNOSIS — E785 Hyperlipidemia, unspecified: Secondary | ICD-10-CM | POA: Diagnosis present

## 2021-06-13 DIAGNOSIS — M545 Low back pain, unspecified: Secondary | ICD-10-CM | POA: Diagnosis not present

## 2021-06-13 DIAGNOSIS — K921 Melena: Secondary | ICD-10-CM | POA: Diagnosis present

## 2021-06-13 DIAGNOSIS — Z86010 Personal history of colonic polyps: Secondary | ICD-10-CM

## 2021-06-13 DIAGNOSIS — D649 Anemia, unspecified: Secondary | ICD-10-CM | POA: Diagnosis not present

## 2021-06-13 DIAGNOSIS — K589 Irritable bowel syndrome without diarrhea: Secondary | ICD-10-CM | POA: Diagnosis present

## 2021-06-13 DIAGNOSIS — E860 Dehydration: Secondary | ICD-10-CM | POA: Diagnosis not present

## 2021-06-13 DIAGNOSIS — F3340 Major depressive disorder, recurrent, in remission, unspecified: Secondary | ICD-10-CM | POA: Diagnosis present

## 2021-06-13 DIAGNOSIS — Z96641 Presence of right artificial hip joint: Secondary | ICD-10-CM | POA: Diagnosis present

## 2021-06-13 DIAGNOSIS — F419 Anxiety disorder, unspecified: Secondary | ICD-10-CM | POA: Diagnosis present

## 2021-06-13 DIAGNOSIS — E1165 Type 2 diabetes mellitus with hyperglycemia: Secondary | ICD-10-CM | POA: Diagnosis present

## 2021-06-13 DIAGNOSIS — G40909 Epilepsy, unspecified, not intractable, without status epilepticus: Secondary | ICD-10-CM | POA: Diagnosis present

## 2021-06-13 DIAGNOSIS — Z79899 Other long term (current) drug therapy: Secondary | ICD-10-CM

## 2021-06-13 DIAGNOSIS — Z90710 Acquired absence of both cervix and uterus: Secondary | ICD-10-CM | POA: Diagnosis not present

## 2021-06-13 DIAGNOSIS — K625 Hemorrhage of anus and rectum: Secondary | ICD-10-CM | POA: Diagnosis present

## 2021-06-13 DIAGNOSIS — Z20822 Contact with and (suspected) exposure to covid-19: Secondary | ICD-10-CM | POA: Diagnosis not present

## 2021-06-13 DIAGNOSIS — Z7984 Long term (current) use of oral hypoglycemic drugs: Secondary | ICD-10-CM

## 2021-06-13 DIAGNOSIS — K648 Other hemorrhoids: Secondary | ICD-10-CM | POA: Diagnosis not present

## 2021-06-13 DIAGNOSIS — D500 Iron deficiency anemia secondary to blood loss (chronic): Secondary | ICD-10-CM | POA: Diagnosis present

## 2021-06-13 DIAGNOSIS — E1169 Type 2 diabetes mellitus with other specified complication: Secondary | ICD-10-CM | POA: Diagnosis not present

## 2021-06-13 DIAGNOSIS — K649 Unspecified hemorrhoids: Secondary | ICD-10-CM | POA: Diagnosis not present

## 2021-06-13 DIAGNOSIS — K2270 Barrett's esophagus without dysplasia: Secondary | ICD-10-CM | POA: Diagnosis not present

## 2021-06-13 DIAGNOSIS — K219 Gastro-esophageal reflux disease without esophagitis: Secondary | ICD-10-CM | POA: Diagnosis not present

## 2021-06-13 DIAGNOSIS — E871 Hypo-osmolality and hyponatremia: Secondary | ICD-10-CM | POA: Diagnosis present

## 2021-06-13 DIAGNOSIS — Z85828 Personal history of other malignant neoplasm of skin: Secondary | ICD-10-CM

## 2021-06-13 DIAGNOSIS — F4310 Post-traumatic stress disorder, unspecified: Secondary | ICD-10-CM | POA: Diagnosis present

## 2021-06-13 DIAGNOSIS — D509 Iron deficiency anemia, unspecified: Secondary | ICD-10-CM | POA: Diagnosis not present

## 2021-06-13 DIAGNOSIS — Z7982 Long term (current) use of aspirin: Secondary | ICD-10-CM | POA: Diagnosis not present

## 2021-06-13 DIAGNOSIS — Z885 Allergy status to narcotic agent status: Secondary | ICD-10-CM | POA: Diagnosis not present

## 2021-06-13 LAB — POCT GLYCOSYLATED HEMOGLOBIN (HGB A1C): Hemoglobin A1C: 5.2 % (ref 4.0–5.6)

## 2021-06-13 LAB — RESP PANEL BY RT-PCR (FLU A&B, COVID) ARPGX2
Influenza A by PCR: NEGATIVE
Influenza B by PCR: NEGATIVE
SARS Coronavirus 2 by RT PCR: NEGATIVE

## 2021-06-13 LAB — CBC WITH DIFFERENTIAL/PLATELET
Abs Immature Granulocytes: 0.03 10*3/uL (ref 0.00–0.07)
Basophils Absolute: 0 10*3/uL (ref 0.0–0.1)
Basophils Relative: 1 %
Eosinophils Absolute: 0.1 10*3/uL (ref 0.0–0.5)
Eosinophils Relative: 1 %
HCT: 19.5 % — ABNORMAL LOW (ref 36.0–46.0)
Hemoglobin: 5.4 g/dL — ABNORMAL LOW (ref 12.0–15.0)
Immature Granulocytes: 0 %
Lymphocytes Relative: 15 %
Lymphs Abs: 1 10*3/uL (ref 0.7–4.0)
MCH: 19.7 pg — ABNORMAL LOW (ref 26.0–34.0)
MCHC: 27.7 g/dL — ABNORMAL LOW (ref 30.0–36.0)
MCV: 71.2 fL — ABNORMAL LOW (ref 80.0–100.0)
Monocytes Absolute: 0.6 10*3/uL (ref 0.1–1.0)
Monocytes Relative: 8 %
Neutro Abs: 5.1 10*3/uL (ref 1.7–7.7)
Neutrophils Relative %: 75 %
Platelets: 164 10*3/uL (ref 150–400)
RBC: 2.74 MIL/uL — ABNORMAL LOW (ref 3.87–5.11)
RDW: 16.8 % — ABNORMAL HIGH (ref 11.5–15.5)
WBC: 6.8 10*3/uL (ref 4.0–10.5)
nRBC: 0 % (ref 0.0–0.2)

## 2021-06-13 LAB — BASIC METABOLIC PANEL
Anion gap: 9 (ref 5–15)
BUN: 10 mg/dL (ref 8–23)
CO2: 25 mmol/L (ref 22–32)
Calcium: 9.6 mg/dL (ref 8.9–10.3)
Chloride: 97 mmol/L — ABNORMAL LOW (ref 98–111)
Creatinine, Ser: 0.93 mg/dL (ref 0.44–1.00)
GFR, Estimated: >60 mL/min (ref >60)
Glucose, Bld: 161 mg/dL — ABNORMAL HIGH (ref 70–99)
Potassium: 4 mmol/L (ref 3.5–5.1)
Sodium: 131 mmol/L — ABNORMAL LOW (ref 135–145)

## 2021-06-13 LAB — URINALYSIS, ROUTINE W REFLEX MICROSCOPIC
Bilirubin Urine: NEGATIVE
Glucose, UA: NEGATIVE mg/dL
Ketones, ur: NEGATIVE mg/dL
Nitrite: NEGATIVE
Protein, ur: NEGATIVE mg/dL
Specific Gravity, Urine: 1.004 — ABNORMAL LOW (ref 1.005–1.030)
pH: 7 (ref 5.0–8.0)

## 2021-06-13 LAB — IRON AND TIBC
Iron: 16 ug/dL — ABNORMAL LOW (ref 28–170)
Saturation Ratios: 3 % — ABNORMAL LOW (ref 10.4–31.8)
TIBC: 511 ug/dL — ABNORMAL HIGH (ref 250–450)
UIBC: 495 ug/dL

## 2021-06-13 LAB — FOLATE: Folate: 13.4 ng/mL (ref >5.9)

## 2021-06-13 LAB — PREPARE RBC (CROSSMATCH)

## 2021-06-13 LAB — GLUCOSE, CAPILLARY: Glucose-Capillary: 96 mg/dL (ref 70–99)

## 2021-06-13 LAB — FERRITIN: Ferritin: 3 ng/mL — ABNORMAL LOW (ref 11–307)

## 2021-06-13 MED ORDER — PANTOPRAZOLE SODIUM 40 MG PO TBEC: 40.0000 mg | DELAYED_RELEASE_TABLET | Freq: Two times a day (BID) | ORAL | 5 refills | Status: DC

## 2021-06-13 MED ORDER — ACETAMINOPHEN 325 MG PO TABS: 650.0000 mg | ORAL_TABLET | Freq: Four times a day (QID) | ORAL | Status: DC | PRN

## 2021-06-13 MED ORDER — ONDANSETRON HCL 4 MG PO TABS
4.0000 mg | ORAL_TABLET | Freq: Four times a day (QID) | ORAL | Status: DC | PRN
  Administered 2021-06-16: 4 mg via ORAL
  Filled 2021-06-13: qty 1

## 2021-06-13 MED ORDER — ACETAMINOPHEN 325 MG PO TABS
650.0000 mg | ORAL_TABLET | Freq: Once | ORAL | Status: AC
  Administered 2021-06-13: 650 mg via ORAL
  Filled 2021-06-13: qty 2

## 2021-06-13 MED ORDER — HYDROXYZINE HCL 25 MG PO TABS
25.0000 mg | ORAL_TABLET | Freq: Three times a day (TID) | ORAL | Status: DC | PRN
  Administered 2021-06-15: 25 mg via ORAL
  Filled 2021-06-13: qty 1

## 2021-06-13 MED ORDER — CYCLOBENZAPRINE HCL 10 MG PO TABS: 10.0000 mg | ORAL_TABLET | Freq: Three times a day (TID) | ORAL | Status: DC | PRN

## 2021-06-13 MED ORDER — FLEET ENEMA 7-19 GM/118ML RE ENEM: 1.0000 | ENEMA | Freq: Once | RECTAL | Status: DC | PRN

## 2021-06-13 MED ORDER — ONDANSETRON HCL 4 MG/2ML IJ SOLN: 4.0000 mg | Freq: Four times a day (QID) | INTRAMUSCULAR | Status: DC | PRN

## 2021-06-13 MED ORDER — PROPRANOLOL HCL 10 MG PO TABS
10.0000 mg | ORAL_TABLET | Freq: Three times a day (TID) | ORAL | Status: DC
  Administered 2021-06-14 – 2021-06-17 (×8): 10 mg via ORAL
  Filled 2021-06-13 (×10): qty 1

## 2021-06-13 MED ORDER — MONTELUKAST SODIUM 10 MG PO TABS
10.0000 mg | ORAL_TABLET | Freq: Every day | ORAL | Status: DC
  Administered 2021-06-13 – 2021-06-16 (×4): 10 mg via ORAL
  Filled 2021-06-13 (×4): qty 1

## 2021-06-13 MED ORDER — CYCLOBENZAPRINE HCL 10 MG PO TABS: 10.0000 mg | ORAL_TABLET | Freq: Three times a day (TID) | ORAL | 0 refills | Status: DC | PRN

## 2021-06-13 MED ORDER — PANTOPRAZOLE SODIUM 40 MG IV SOLR
40.0000 mg | Freq: Two times a day (BID) | INTRAVENOUS | Status: DC
  Administered 2021-06-13 – 2021-06-16 (×6): 40 mg via INTRAVENOUS
  Filled 2021-06-13 (×6): qty 10

## 2021-06-13 MED ORDER — SODIUM CHLORIDE 0.9 % IV SOLN: INTRAVENOUS | Status: DC

## 2021-06-13 MED ORDER — SORBITOL 70 % SOLN
30.0000 mL | Freq: Every day | Status: DC | PRN
  Filled 2021-06-13: qty 30

## 2021-06-13 MED ORDER — SENNOSIDES-DOCUSATE SODIUM 8.6-50 MG PO TABS: 1.0000 | ORAL_TABLET | Freq: Every evening | ORAL | Status: DC | PRN

## 2021-06-13 MED ORDER — SODIUM CHLORIDE 0.9% FLUSH
3.0000 mL | Freq: Two times a day (BID) | INTRAVENOUS | Status: DC
  Administered 2021-06-13 – 2021-06-17 (×8): 3 mL via INTRAVENOUS

## 2021-06-13 MED ORDER — ESCITALOPRAM OXALATE 10 MG PO TABS
20.0000 mg | ORAL_TABLET | Freq: Every day | ORAL | Status: DC
  Administered 2021-06-14 – 2021-06-17 (×4): 20 mg via ORAL
  Filled 2021-06-13 (×5): qty 2

## 2021-06-13 MED ORDER — ATENOLOL 50 MG PO TABS
50.0000 mg | ORAL_TABLET | Freq: Two times a day (BID) | ORAL | Status: DC
Start: 2021-06-13 — End: 2021-06-14
  Filled 2021-06-13: qty 1

## 2021-06-13 MED ORDER — DIPHENHYDRAMINE HCL 25 MG PO CAPS
25.0000 mg | ORAL_CAPSULE | Freq: Once | ORAL | Status: AC
  Administered 2021-06-13: 25 mg via ORAL
  Filled 2021-06-13: qty 1

## 2021-06-13 MED ORDER — METOPROLOL TARTRATE 5 MG/5ML IV SOLN: 5.0000 mg | Freq: Four times a day (QID) | INTRAVENOUS | Status: DC | PRN

## 2021-06-13 MED ORDER — FERROUS SULFATE 325 (65 FE) MG PO TABS
325.0000 mg | ORAL_TABLET | Freq: Two times a day (BID) | ORAL | Status: DC
  Administered 2021-06-13 – 2021-06-17 (×8): 325 mg via ORAL
  Filled 2021-06-13 (×8): qty 1

## 2021-06-13 MED ORDER — ALBUTEROL SULFATE (2.5 MG/3ML) 0.083% IN NEBU: 2.5000 mg | INHALATION_SOLUTION | RESPIRATORY_TRACT | Status: DC | PRN

## 2021-06-13 MED ORDER — ALPRAZOLAM 0.5 MG PO TABS
1.0000 mg | ORAL_TABLET | Freq: Three times a day (TID) | ORAL | Status: DC | PRN
  Administered 2021-06-14: 10:00:00 1 mg via ORAL
  Filled 2021-06-13: qty 2

## 2021-06-13 MED ORDER — GABAPENTIN 100 MG PO CAPS
100.0000 mg | ORAL_CAPSULE | Freq: Two times a day (BID) | ORAL | Status: DC
  Administered 2021-06-13 – 2021-06-17 (×8): 100 mg via ORAL
  Filled 2021-06-13 (×8): qty 1

## 2021-06-13 MED ORDER — INSULIN ASPART 100 UNIT/ML IJ SOLN
0.0000 [IU] | Freq: Three times a day (TID) | INTRAMUSCULAR | Status: DC
  Administered 2021-06-14: 2 [IU] via SUBCUTANEOUS
  Administered 2021-06-14: 1 [IU] via SUBCUTANEOUS
  Filled 2021-06-13 (×2): qty 1

## 2021-06-13 MED ORDER — ROSUVASTATIN CALCIUM 10 MG PO TABS
5.0000 mg | ORAL_TABLET | Freq: Every day | ORAL | Status: DC
  Administered 2021-06-14 – 2021-06-17 (×4): 5 mg via ORAL
  Filled 2021-06-13 (×4): qty 1

## 2021-06-13 MED ORDER — LACOSAMIDE 50 MG PO TABS
150.0000 mg | ORAL_TABLET | Freq: Two times a day (BID) | ORAL | Status: DC
  Administered 2021-06-13 – 2021-06-17 (×8): 150 mg via ORAL
  Filled 2021-06-13 (×8): qty 3

## 2021-06-13 MED ORDER — TRAZODONE HCL 50 MG PO TABS
150.0000 mg | ORAL_TABLET | Freq: Every day | ORAL | Status: DC
  Administered 2021-06-13 – 2021-06-16 (×4): 150 mg via ORAL
  Filled 2021-06-13 (×4): qty 1

## 2021-06-13 MED ORDER — LEVETIRACETAM 500 MG PO TABS
500.0000 mg | ORAL_TABLET | Freq: Two times a day (BID) | ORAL | Status: DC
  Administered 2021-06-13 – 2021-06-17 (×8): 500 mg via ORAL
  Filled 2021-06-13 (×8): qty 1

## 2021-06-13 MED ORDER — ACETAMINOPHEN 650 MG RE SUPP: 650.0000 mg | Freq: Four times a day (QID) | RECTAL | Status: DC | PRN

## 2021-06-13 MED ORDER — SODIUM CHLORIDE 0.9% IV SOLUTION: Freq: Once | INTRAVENOUS | Status: AC

## 2021-06-13 NOTE — Plan of Care (Signed)

## 2021-06-13 NOTE — Assessment & Plan Note (Addendum)
-   Crestor 

## 2021-06-13 NOTE — Assessment & Plan Note (Addendum)
-   Blood pressure borderline on 06/14/2021.   -Atenolol discontinued.   -BP improved.   -Patient maintained on home regimen propranolol.   -Patient's atenolol will be discontinued on discharge.  -Outpatient follow-up with PCP

## 2021-06-13 NOTE — Assessment & Plan Note (Addendum)
-   Well-controlled type 2 diabetes mellitus -Hemoglobin A1c 5.2 (06/13/2021). -Patient's oral hypoglycemic agents were held during the hospitalization and patient maintained on sliding scale insulin.  -Oral hypoglycemic agents will be resumed on discharge.

## 2021-06-13 NOTE — Progress Notes (Signed)
[facility] [place], [place] [address]  Internal MEDICINE  Office Visit Note  Patient Name: Christina French  [id number]  [id number]  Date of Service: 06/13/2021  Chief Complaint  Patient presents with   Follow-up    Discuss meds, pt has Hemorids and wants to discuss blood loss, poor appetite, referral for diabetes spec. Endo.    Depression   Diabetes   Gastroesophageal Reflux   Hypertension   Anemia   Anxiety   Shortness of Breath   Medication Refill    HPI Christina French presents for a follow up visit for worsening shortness of breath and fatigue. Christina French went to [facility] ER for seizures and SOB on 05/27/21. Christina French stayed in the ER waiting room overnight and then left the ER without being seen after Christina French checked with triage on the wait time and was told there were still 18 people in front of her. Christina French had labs, EKG and imaging done at the ER before leaving. Her CBC was abnormal with a low hemoglobin of 7.4. Christina French reports that Christina French has a history of hemorrhoids and Christina French has been having rectal bleeding and states "I am just pouring blood when I go to the bathroom." Christina French states that Christina French has had this problem before and had surgery done for hemorrhoids but Christina French still has bleeding afterward. Christina French has been drinking some water but Christina French has had a poor appetite and is not eating much food at all nor does Christina French feel hungry. There is also concern for dehydration due to blood loss. Christina French reports feeling fatigued, weak, woozy, and short of breath.  Christina French also has chronic hyponatremia. Her sodium level was 129 on 05/27/21. Christina French takes medications that can cause hyponatremia.  Christina French needs to be evaluated by GI for rectal bleeding.   Christina French has lost 12 lbs since her office visit in December.    Current Medication: Outpatient Encounter Medications as of 06/13/2021  Medication Sig   Accu-Chek Softclix Lancets lancets Use as instructed twice a day Dx E11.65   acetaminophen (TYLENOL) 500 MG tablet Take 1,000 mg by mouth 2  (two) times daily as needed for mild pain or headache.   ALPRAZolam (XANAX) 1 MG tablet Take 1 tablet (1 mg total) by mouth 3 (three) times daily as needed for anxiety.   ASPIRIN LOW DOSE 81 MG EC tablet TAKE 1 TABLET BY MOUTH EVERY MORNING   atenolol (TENORMIN) 50 MG tablet TAKE 1 TABLET BY MOUTH TWICE A DAY   augmented betamethasone dipropionate (DIPROLENE) 0.05 % ointment Apply topically 2 (two) times daily.   cetirizine (ZYRTEC) 10 MG tablet TAKE 1 TABLET BY MOUTH TWICE A DAY FOR RASH. IF RASH AND ITCH STILL BOTHERSOME AND NO SIDE EFFECTS (DRY MOUTH, BLURRY VISION), INCREASE TO 2 TABS TWICE A DAY.   Continuous Blood Gluc Receiver (DEXCOM G6 RECEIVER) DEVI Use as directed DX ell.65   Continuous Blood Gluc Sensor (DEXCOM G6 SENSOR) MISC Use every 10 days dx e11.65   Continuous Blood Gluc Transmit (DEXCOM G6 TRANSMITTER) MISC Use as directed   escitalopram (LEXAPRO) 20 MG tablet Take 20 mg by mouth daily.    famotidine (PEPCID) 20 MG tablet TAKE 1 TABLET BY MOUTH TWICE A DAY   gabapentin (NEURONTIN) 100 MG capsule TAKE ONE CAPSULE BY MOUTH TWICE A DAY   glimepiride (AMARYL) 2 MG tablet Take 1 tablet (2 mg total) by mouth daily before breakfast.   glucose blood (ACCU-CHEK GUIDE) test strip Use as directed twice a strips twice  a day E 11.65   Lacosamide 150 MG TABS Take 150 mg by mouth 2 (two) times daily.    levETIRAcetam (KEPPRA) 500 MG tablet Take 500 mg by mouth 2 (two) times daily.   lisinopril (ZESTRIL) 5 MG tablet TAKE 1 TABLET BY MOUTH DAILY   metFORMIN (GLUCOPHAGE) 500 MG tablet TAKE 1 TABLET BY MOUTH TWICE A DAY WITH A MEAL   montelukast (SINGULAIR) 10 MG tablet Take 1 tablet (10 mg total) by mouth at bedtime.   rosuvastatin (CRESTOR) 5 MG tablet TAKE 1 TABLET BY MOUTH DAILY   traZODone (DESYREL) 100 MG tablet Take 1 tablet (100 mg total) by mouth at bedtime.   [DISCONTINUED] cyclobenzaprine (FLEXERIL) 10 MG tablet Take 1 tablet (10 mg total) by mouth 3 (three) times daily as needed for  muscle spasms.   [DISCONTINUED] pantoprazole (PROTONIX) 40 MG tablet Take 1 tablet (40 mg total) by mouth daily.   cyclobenzaprine (FLEXERIL) 10 MG tablet Take 1 tablet (10 mg total) by mouth 3 (three) times daily as needed for muscle spasms.   ferrous sulfate 325 (65 FE) MG EC tablet Take 1 tablet (325 mg total) by mouth 2 (two) times daily.   pantoprazole (PROTONIX) 40 MG tablet Take 1 tablet (40 mg total) by mouth 2 (two) times daily.   No facility-administered encounter medications on file as of 06/13/2021.    Surgical History: Past Surgical History:  Procedure Laterality Date   ABDOMINAL HYSTERECTOMY     CESAREAN SECTION     COLONOSCOPY N/A 08/31/2014   Procedure: COLONOSCOPY;  Surgeon: Lucilla Lame, MD;  Location: [place];  Service: Gastroenterology;  Laterality: N/A;   COLONOSCOPY N/A 08/05/2016   Procedure: COLONOSCOPY;  Surgeon: [name], MD;  Location: [facility] ENDOSCOPY;  Service: Endoscopy;  Laterality: N/A;   COLONOSCOPY WITH PROPOFOL N/A 12/22/2018   Procedure: COLONOSCOPY WITH PROPOFOL;  Surgeon: Virgel Manifold, MD;  Location: [facility] ENDOSCOPY;  Service: Endoscopy;  Laterality: N/A;   CYST EXCISION  2011   from throat   ESOPHAGOGASTRODUODENOSCOPY N/A 08/31/2014   Procedure: ESOPHAGOGASTRODUODENOSCOPY (EGD);  Surgeon: Lucilla Lame, MD;  Location: [place];  Service: Gastroenterology;  Laterality: N/A;   ESOPHAGOGASTRODUODENOSCOPY N/A 12/21/2018   Procedure: ESOPHAGOGASTRODUODENOSCOPY (EGD);  Surgeon: Virgel Manifold, MD;  Location: [facility] ENDOSCOPY;  Service: Endoscopy;  Laterality: N/A;   ESOPHAGOGASTRODUODENOSCOPY (EGD) WITH PROPOFOL N/A 07/01/2017   Procedure: ESOPHAGOGASTRODUODENOSCOPY (EGD) WITH PROPOFOL;  Surgeon: [name], MD;  Location: [facility] ENDOSCOPY;  Service: Gastroenterology;  Laterality: N/A;   FLEXIBLE SIGMOIDOSCOPY N/A 07/01/2017   Procedure: FLEXIBLE SIGMOIDOSCOPY;  Surgeon: [name], MD;  Location: [facility] ENDOSCOPY;  Service:  Gastroenterology;  Laterality: N/A;   GIVENS CAPSULE STUDY  12/22/2018   Procedure: GIVENS CAPSULE STUDY;  Surgeon: Virgel Manifold, MD;  Location: [facility] ENDOSCOPY;  Service: Endoscopy;;   HEMORRHOID SURGERY     INCISION AND DRAINAGE HIP Right 11/25/2014   Procedure: IRRIGATION  AND DRAINAGEOF RIGHT HIP WITH PLACEMENT OF ANTIBIOUTIC BEADS;  Surgeon: Mcarthur Rossetti, MD;  Location: [place];  Service: Orthopedics;  Laterality: Right;   INCISION AND DRAINAGE HIP Right 11/28/2014   Procedure: Repeat I&D Right Hip;  Surgeon: Mcarthur Rossetti, MD;  Location: [place];  Service: Orthopedics;  Laterality: Right;   JOINT REPLACEMENT Right 2007   hip   LOOP RECORDER INSERTION N/A 04/04/2019   Procedure: LOOP RECORDER INSERTION;  Surgeon: Isaias Cowman, MD;  Location: [place] [facility];  Service: Cardiovascular;  Laterality: N/A;   TOTAL HIP REVISION Right  03/19/2015   Procedure: RIGHT TOTAL HIP ARTHROPLASTY REVISION;  Surgeon: Meredith Pel, MD;  Location: [place];  Service: Orthopedics;  Laterality: Right;   TOTAL HIP REVISION Right 11/18/2015   Procedure: TOTAL HIP REVISION;  Surgeon: Frederik Pear, MD;  Location: [place];  Service: Orthopedics;  Laterality: Right;    Medical History: Past Medical History:  Diagnosis Date   Anemia    Anxiety    Arthritis    Barrett esophagus    Cancer ([place]) 2016    Skin cancer right leg mylenoma   Depression    Diabetes ([place]) 04/25/2017   GERD (gastroesophageal reflux disease)    History of blood transfusion    with a surgery   History of bronchitis    History of pneumonia    Hypertension    IBS (irritable bowel syndrome)    Pneumonia 09/27/15   PTSD (post-traumatic stress disorder)    Renal insufficiency    reports acute kidney injury   Seizures ([place])    rare - last one 07/30/14 - was very anemic.  Silent seziure -June 2016-    Vertigo    thinks related to sinus issues    Family History: Family History  Problem Relation Age of  Onset   Alcoholism Father    Throat cancer Mother    Breast cancer Maternal Aunt     Social History   Socioeconomic History   Marital status: Single    Spouse name: Not on file   Number of children: Not on file   Years of education: Not on file   Highest education level: Not on file  Occupational History   Not on file  Tobacco Use   Smoking status: Never   Smokeless tobacco: Never  Vaping Use   Vaping Use: Never used  Substance and Sexual Activity   Alcohol use: No    Alcohol/week: 0.0 standard drinks   Drug use: No   Sexual activity: Not Currently  Other Topics Concern   Not on file  Social History Narrative   Not on file   Social Determinants of Health   Financial Resource Strain: Not on file  Food Insecurity: Not on file  Transportation Needs: Not on file  Physical Activity: Not on file  Stress: Not on file  Social Connections: Not on file  Intimate Partner Violence: Not on file      Review of Systems  Constitutional:  Positive for fatigue. Negative for chills and unexpected weight change.  HENT:  Negative for congestion, rhinorrhea, sneezing and sore throat.   Eyes:  Negative for redness.  Respiratory:  Positive for shortness of breath. Negative for cough, chest tightness and wheezing.   Cardiovascular:  Negative for chest pain and palpitations.  Gastrointestinal:  Positive for anal bleeding. Negative for abdominal pain, constipation, diarrhea, nausea and vomiting.  Genitourinary:  Negative for dysuria and frequency.  Musculoskeletal: Negative.  Negative for arthralgias, back pain, joint swelling and neck pain.  Skin:  Positive for pallor. Negative for rash.  Neurological:  Positive for dizziness and light-headedness. Negative for tremors, numbness and headaches.  Hematological:  Negative for adenopathy. Does not bruise/bleed easily.  Psychiatric/Behavioral:  Negative for behavioral problems (Depression), sleep disturbance and suicidal ideas. The patient is  not nervous/anxious.    Vital Signs: BP (!) 156/82    Pulse 91    Temp 98.3 F (36.8 C) (Temporal)    Resp 16    Ht 5\' 1"  (1.549 m)    Wt 178 lb 3.2  oz (80.8 kg)    SpO2 98%    BMI 33.67 kg/m    Physical Exam Vitals reviewed.  Constitutional:      General: Christina French is not in acute distress.    Appearance: Christina French is well-developed. Christina French is obese. Christina French is ill-appearing.  HENT:     Head: Normocephalic and atraumatic.     Mouth/Throat:     Pharynx: Oropharynx is clear.  Eyes:     Pupils: Pupils are equal, round, and reactive to light.  Cardiovascular:     Rate and Rhythm: Regular rhythm. Tachycardia present.     Heart sounds: Normal heart sounds. No murmur heard.    Comments: Heart rate slightly tachycardic with auscultation Pulmonary:     Effort: Tachypnea present. No respiratory distress.     Breath sounds: Normal breath sounds. No wheezing.     Comments: Increased work of breathing Abdominal:     General: Bowel sounds are normal.     Palpations: Abdomen is soft. There is no mass.     Tenderness: There is no abdominal tenderness.  Skin:    Capillary Refill: Capillary refill takes 2 to 3 seconds.     Coloration: Skin is pale.  Neurological:     Mental Status: Christina French is alert and oriented to person, place, and time.     Motor: Weakness present.  Psychiatric:        Mood and Affect: Mood normal.        Behavior: Behavior normal.       Assessment/Plan: 1. Symptomatic anemia Patient is symptomatic with worsening shortness of breath, fatigue, and weakness. Prior CBC was already significantly anemic but patient was not treated for it. Stat CBC ordered and considering direct admission to [facility] pending lab results.  - CBC with Differential/Platelet  2. Rectal bleeding Stat CBC ordered and patient sent immediately to the hospital for labs to reassess anemia due to continued rectal bleeding. Will review labs and discuss possible direct admission to [facility] for rectal bleeding and anemia with Dr.  Clayborn Bigness. Patient is aware of plan and is agreeable.  - CBC with Differential/Platelet  3. Other hemorrhoids History of bleeding hemorrhoids, states this is still a problem and patient believes that this is where the rectal bleeding is coming from   4. Chronic hyponatremia Stat BMP to reevaluate sodium level.  - Basic Metabolic Panel (BMET)  5. Type 2 diabetes mellitus with hyperglycemia, without long-term current use of insulin (HCC) A1C remains well controlled and is 5.2 today - POCT glycosylated hemoglobin (Hb A1C)  6. Chronic bilateral low back pain without sciatica Stable, medication refilled - cyclobenzaprine (FLEXERIL) 10 MG tablet; Take 1 tablet (10 mg total) by mouth 3 (three) times daily as needed for muscle spasms.  Dispense: 90 tablet; Refill: 0  7. Gastroesophageal reflux disease without esophagitis Pantoprazole increased to twice daily for increased heartburn - pantoprazole (PROTONIX) 40 MG tablet; Take 1 tablet (40 mg total) by mouth 2 (two) times daily.  Dispense: 60 tablet; Refill: 5   General Counseling: juliette standre understanding of the findings of todays visit and agrees with plan of treatment. I have discussed any further diagnostic evaluation that may be needed or ordered today. We also reviewed her medications today. Christina French has been encouraged to call the office with any questions or concerns that should arise related to todays visit.    Orders Placed This Encounter  Procedures   CBC with Differential/Platelet   Basic Metabolic Panel (BMET)   POCT glycosylated hemoglobin (  Hb A1C)    Meds ordered this encounter  Medications   pantoprazole (PROTONIX) 40 MG tablet    Sig: Take 1 tablet (40 mg total) by mouth 2 (two) times daily.    Dispense:  60 tablet    Refill:  5   cyclobenzaprine (FLEXERIL) 10 MG tablet    Sig: Take 1 tablet (10 mg total) by mouth 3 (three) times daily as needed for muscle spasms.    Dispense:  90 tablet    Refill:  0    Return  for need stat labs, will discuss with Dr. Stephanie Coup to direct admit after lab results reviewed. .  Total time spent:30 Minutes Time spent includes review of chart, medications, test results, and follow up plan with the patient.    Controlled Substance Database was reviewed by me.  This patient was seen by Jonetta Osgood, FNP-C in collaboration with Dr. Clayborn Bigness as a part of collaborative care agreement.   Kessa Fairbairn R. Valetta Fuller, MSN, FNP-C Internal medicine

## 2021-06-13 NOTE — H&P (Signed)
History and Physical    Christina French DTO:[id number] DOB: 26-May-1955 DOA: 06/13/2021  PCP: [name], MD Patient coming from: Home  I have personally briefly reviewed patient's old medical records in [place]  Chief Complaint: Rectal bleeding  HPI: Christina French is a 66 y.o. female with medical history significant of hemorrhoids, diabetes mellitus well-controlled, hypertension, seizure disorder, history of Barrett's, presenting as a direct admission from PCPs office for ongoing rectal bleeding which patient states has been ongoing for several weeks.  Patient describes bleeding with every bowel movement and describes it as bright red bloody bowel movement and feels it is from her hemorrhoids which she states are the size of grapes and feel needs surgical procedure/input from general surgery.  Patient with complaints of generalized fatigue, weakness, shortness of breath, fatigue, nausea, diffuse abdominal pain more in the lower abdominal region..  Patient endorses an episode of syncope 4 weeks prior to admission with none since then.  Patient states always feeling cold.  Patient states has had weight loss from 206 pounds to 178 pounds over several months.  Patient denies any fevers, no chills, no emesis, no melena, no hematemesis.  Patient denies any dysuria.  Patient noted to have been seen in the ED on May 27, 2021 at which time noted to have a hemoglobin of 7.4 and stated was assessed and subsequently discharged.  Patient followed up at PCPs office with complaints of rectal bleeding.  CBC done at PCPs office with a hemoglobin of 5.4, MCV of 71.2.  Hospitalist service was called for direct admission.  At PCPs office patient noted to have stable vital signs. ED Course: Patient direct admission from PCPs office.  Review of Systems: As per HPI otherwise all other systems reviewed and are negative.  Past Medical History:  Diagnosis Date   Anemia    Anxiety    Arthritis    Barrett  esophagus    Cancer ([place]) 2016    Skin cancer right leg mylenoma   Depression    Diabetes ([place]) 04/25/2017   GERD (gastroesophageal reflux disease)    History of blood transfusion    with a surgery   History of bronchitis    History of pneumonia    Hypertension    IBS (irritable bowel syndrome)    Pneumonia 09/27/15   PTSD (post-traumatic stress disorder)    Renal insufficiency    reports acute kidney injury   Seizures ([place])    rare - last one 07/30/14 - was very anemic.  Silent seziure -June 2016-    Vertigo    thinks related to sinus issues    Past Surgical History:  Procedure Laterality Date   ABDOMINAL HYSTERECTOMY     CESAREAN SECTION     COLONOSCOPY N/A 08/31/2014   Procedure: COLONOSCOPY;  Surgeon: Lucilla Lame, MD;  Location: [place];  Service: Gastroenterology;  Laterality: N/A;   COLONOSCOPY N/A 08/05/2016   Procedure: COLONOSCOPY;  Surgeon: [name], MD;  Location: [facility] ENDOSCOPY;  Service: Endoscopy;  Laterality: N/A;   COLONOSCOPY WITH PROPOFOL N/A 12/22/2018   Procedure: COLONOSCOPY WITH PROPOFOL;  Surgeon: Virgel Manifold, MD;  Location: [facility] ENDOSCOPY;  Service: Endoscopy;  Laterality: N/A;   CYST EXCISION  2011   from throat   ESOPHAGOGASTRODUODENOSCOPY N/A 08/31/2014   Procedure: ESOPHAGOGASTRODUODENOSCOPY (EGD);  Surgeon: Lucilla Lame, MD;  Location: [place];  Service: Gastroenterology;  Laterality: N/A;   ESOPHAGOGASTRODUODENOSCOPY N/A 12/21/2018   Procedure: ESOPHAGOGASTRODUODENOSCOPY (EGD);  Surgeon: [name]  B, MD;  Location: [facility] ENDOSCOPY;  Service: Endoscopy;  Laterality: N/A;   ESOPHAGOGASTRODUODENOSCOPY (EGD) WITH PROPOFOL N/A 07/01/2017   Procedure: ESOPHAGOGASTRODUODENOSCOPY (EGD) WITH PROPOFOL;  Surgeon: [name], MD;  Location: [facility] ENDOSCOPY;  Service: Gastroenterology;  Laterality: N/A;   FLEXIBLE SIGMOIDOSCOPY N/A 07/01/2017   Procedure: FLEXIBLE SIGMOIDOSCOPY;  Surgeon: [name], MD;  [facility] ENDOSCOPY;   Service: Gastroenterology;  Laterality: N/A;   GIVENS CAPSULE STUDY  12/22/2018   Procedure: GIVENS CAPSULE STUDY;  Surgeon: Virgel Manifold, MD;  Location: [facility] ENDOSCOPY;  Service: Endoscopy;;   HEMORRHOID SURGERY     INCISION AND DRAINAGE HIP Right 11/25/2014   Procedure: IRRIGATION  AND DRAINAGEOF RIGHT HIP WITH PLACEMENT OF ANTIBIOUTIC BEADS;  Surgeon: Mcarthur Rossetti, MD;  Location: [place];  Service: Orthopedics;  Laterality: Right;   INCISION AND DRAINAGE HIP Right 11/28/2014   Procedure: Repeat I&D Right Hip;  Surgeon: Mcarthur Rossetti, MD;  Location: [place];  Service: Orthopedics;  Laterality: Right;   JOINT REPLACEMENT Right 2007   hip   LOOP RECORDER INSERTION N/A 04/04/2019   Procedure: LOOP RECORDER INSERTION;  Surgeon: Isaias Cowman, MD;  Location: [place] [facility];  Service: Cardiovascular;  Laterality: N/A;   TOTAL HIP REVISION Right 03/19/2015   Procedure: RIGHT TOTAL HIP ARTHROPLASTY REVISION;  Surgeon: Meredith Pel, MD;  Location: [place];  Service: Orthopedics;  Laterality: Right;   TOTAL HIP REVISION Right 11/18/2015   Procedure: TOTAL HIP REVISION;  Surgeon: Frederik Pear, MD;  Location: [place];  Service: Orthopedics;  Laterality: Right;    Social History  reports that she has never smoked. She has never used smokeless tobacco. She reports that she does not drink alcohol and does not use drugs.  Allergies  Allergen Reactions   Morphine And Related Other (See Comments)    Blacked out once    Family History  Problem Relation Age of Onset   Alcoholism Father    Throat cancer Mother    Breast cancer Maternal Aunt    Mother deceased from throat cancer at age 77.  Father deceased age unknown with a history of alcoholism.  Prior to Admission medications   Medication Sig Start Date End Date Taking? Authorizing Provider  ASPIRIN LOW DOSE 81 MG EC tablet TAKE 1 TABLET BY MOUTH EVERY MORNING 04/22/21  Yes Abernathy, Alyssa, NP  atenolol  (TENORMIN) 50 MG tablet TAKE 1 TABLET BY MOUTH TWICE A DAY 04/08/21  Yes Abernathy, Alyssa, NP  escitalopram (LEXAPRO) 20 MG tablet Take 20 mg by mouth daily.    Yes [provider]  famotidine (PEPCID) 20 MG tablet TAKE 1 TABLET BY MOUTH TWICE A DAY 05/26/21  Yes Moye, [place], MD  ferrous sulfate 325 (65 FE) MG EC tablet Take 1 tablet (325 mg total) by mouth 2 (two) times daily. 12/23/18 06/13/21 Yes Pyreddy, Reatha Harps, MD  gabapentin (NEURONTIN) 100 MG capsule TAKE ONE CAPSULE BY MOUTH TWICE A DAY 05/26/21  Yes Abernathy, Yetta Flock, NP  glimepiride (AMARYL) 2 MG tablet Take 1 tablet (2 mg total) by mouth daily before breakfast. 02/11/21  Yes [name], MD  Lacosamide 150 MG TABS Take 150 mg by mouth 2 (two) times daily.    Yes [provider]  levETIRAcetam (KEPPRA) 500 MG tablet Take 500 mg by mouth 2 (two) times daily.   Yes [provider]  lisinopril (ZESTRIL) 5 MG tablet TAKE 1 TABLET BY MOUTH DAILY 01/14/21  Yes [name], MD  metFORMIN (GLUCOPHAGE) 500  MG tablet TAKE 1 TABLET BY MOUTH TWICE A DAY WITH A MEAL 05/21/21  Yes Abernathy, Alyssa, NP  pantoprazole (PROTONIX) 40 MG tablet Take 1 tablet (40 mg total) by mouth 2 (two) times daily. 06/13/21  Yes Abernathy, Yetta Flock, NP  propranolol (INDERAL) 10 MG tablet Take 10 mg by mouth 3 (three) times daily. 06/03/21  Yes [provider]  rosuvastatin (CRESTOR) 5 MG tablet TAKE 1 TABLET BY MOUTH DAILY 01/14/21  Yes [name], MD  traZODone (DESYREL) 150 MG tablet Take 150 mg by mouth at bedtime. 06/03/21  Yes [provider]  Accu-Chek Softclix Lancets lancets Use as instructed twice a day Dx E11.65 01/11/20   Ronnell Freshwater, NP  acetaminophen (TYLENOL) 500 MG tablet Take 1,000 mg by mouth 2 (two) times daily as needed for mild pain or headache.    [provider]  ALPRAZolam Duanne Moron) 1 MG tablet Take 1 tablet (1 mg total) by mouth 3 (three) times daily as needed for anxiety. 10/15/20   Lavina Hamman, MD  augmented betamethasone dipropionate (DIPROLENE) 0.05 % ointment Apply topically 2 (two) times daily. 01/30/21   [name], MD  cetirizine (ZYRTEC) 10 MG tablet TAKE 1 TABLET BY MOUTH TWICE A DAY FOR RASH. IF RASH AND ITCH STILL BOTHERSOME AND NO SIDE EFFECTS (DRY MOUTH, BLURRY VISION), INCREASE TO 2 TABS TWICE A DAY. 04/22/21   Moye, [place], MD  Continuous Blood Gluc Receiver ([place]) DEVI Use as directed DX ell.65 01/30/21   [name], MD  Continuous Blood Gluc Sensor (DEXCOM G6 SENSOR) MISC Use every 10 days dx e11.65 01/30/21   [name], MD  Continuous Blood Gluc Transmit (DEXCOM G6 TRANSMITTER) MISC Use as directed 01/30/21   [name], MD  cyclobenzaprine (FLEXERIL) 10 MG tablet Take 1 tablet (10 mg total) by mouth 3 (three) times daily as needed for muscle spasms. 06/13/21   Jonetta Osgood, NP  glucose blood (ACCU-CHEK GUIDE) test strip Use as directed twice a strips twice a day E 11.65 01/11/20   Ronnell Freshwater, NP  hydrOXYzine (ATARAX) 25 MG tablet Take 25 mg by mouth 3 (three) times daily as needed. 06/03/21   [provider]  montelukast (SINGULAIR) 10 MG tablet Take 1 tablet (10 mg total) by mouth at bedtime. 02/27/21   Jonetta Osgood, NP    Physical Exam: Vitals:   06/13/21 1724  BP: (!) 141/79  Pulse: 86  Resp: 16  Temp: 98 F (36.7 C)  TempSrc: Oral  SpO2: 99%    Constitutional: NAD, calm, comfortable.  [place]. Vitals:   06/13/21 1724  BP: (!) 141/79  Pulse: 86  Resp: 16  Temp: 98 F (36.7 C)  TempSrc: Oral  SpO2: 99%   Eyes: PERRL, lids and conjunctivae normal ENMT: Mucous membranes are dry. Posterior pharynx clear of any exudate or lesions.Normal dentition.  Neck: normal, supple, no masses, no thyromegaly Respiratory: clear to auscultation bilaterally, no wheezing, no crackles. Normal respiratory effort. No accessory muscle use.  Cardiovascular: Regular rate and rhythm, no murmurs / rubs / gallops. No extremity  edema. 2+ pedal pulses. No carotid bruits.  Abdomen: Soft, nondistended, diffuse tenderness to palpation.  Positive bowel sounds.  No rebound.  No guarding.  Musculoskeletal: no clubbing / cyanosis. No joint deformity upper and lower extremities. Good ROM, no contractures. Normal muscle tone.  Skin: no rashes, lesions, ulcers. No induration Neurologic: CN 2-12 grossly intact. Sensation intact, DTR normal. Strength 5/5 in all  4.  Psychiatric: Normal judgment and insight. Alert and oriented x 3. Normal mood.   (Labs on Admission: I have personally reviewed following labs and imaging studies  CBC: Recent Labs  Lab 06/13/21 1234  WBC 6.8  NEUTROABS 5.1  HGB 5.4*  HCT 19.5*  MCV 71.2*  PLT 678    Basic Metabolic Panel: Recent Labs  Lab 06/13/21 1234  NA 131*  K 4.0  CL 97*  CO2 25  GLUCOSE 161*  BUN 10  CREATININE 0.93  CALCIUM 9.6    GFR: Estimated Creatinine Clearance: 58.9 mL/min (by C-G formula based on SCr of 0.93 mg/dL).  Liver Function Tests: No results for input(s): AST, ALT, ALKPHOS, BILITOT, PROT, ALBUMIN in the last 168 hours.  Urine analysis:    Component Value Date/Time   COLORURINE YELLOW (A) 10/11/2020 1341   APPEARANCEUR CLOUDY (A) 10/11/2020 1341   APPEARANCEUR Clear 08/27/2020 1110   LABSPEC 1.017 10/11/2020 1341   LABSPEC 1.009 09/25/2011 1003   PHURINE 5.0 10/11/2020 1341   GLUCOSEU NEGATIVE 10/11/2020 1341   GLUCOSEU Negative 09/25/2011 1003   HGBUR NEGATIVE 10/11/2020 1341   BILIRUBINUR neg 01/30/2021 1705   BILIRUBINUR Negative 08/27/2020 1110   BILIRUBINUR Negative 09/25/2011 1003   KETONESUR 5 (A) 10/11/2020 1341   PROTEINUR Negative 01/30/2021 1705   PROTEINUR 30 (A) 10/11/2020 1341   UROBILINOGEN 0.2 01/30/2021 1705   UROBILINOGEN 0.2 03/08/2015 1349   NITRITE positive 01/30/2021 1705   NITRITE NEGATIVE 10/11/2020 1341   LEUKOCYTESUR Large (3+) (A) 01/30/2021 1705   LEUKOCYTESUR NEGATIVE 10/11/2020 1341   LEUKOCYTESUR Negative  09/25/2011 1003    Radiological Exams on Admission: No results found.  EKG: Not done.  Assessment and Plan: * GI bleed- (present on admission) - Patient presenting as a direct admission from PCPs office with several weeks of bright red blood per rectum with every bowel movement. -Patient feels likely hemorrhoidal in nature as she states has hemorrhoids the size of grapefruit. -Patient has been worked up for GI bleed in the past with a negative capsule endoscopy (01/06/2019), upper endoscopy done 12/21/2018 with Barrett's stage C 4 otherwise within normal limits.  Colonoscopy done 12/21/2020 with nonbleeding internal hemorrhoids noted. -Patient presenting from PCPs office with a hemoglobin of 5.4 from 7.4(05/27/2021).  Hemoglobin at 12.6 (01/30/2021). -Patient with complaints of generalized fatigue, shortness of breath, noted to have pallor. -Patient vitals within normal limits and patient not tachycardic. -Check an anemia panel. -Type and cross for 2 units packed red blood cells. -Transfuse 2 units packed red blood cells. -H&H 2 hours posttransfusion. -Place on IV PPI. -Sitz bath. -Follow H&H, transfusion threshold hemoglobin < 7.  -Clear liquids and then n.p.o. after midnight. -Consult with GI for further evaluation and management -  Symptomatic anemia- (present on admission) - Patient presenting with generalized fatigue, lightheadedness, shortness of breath, pallor. -Patient with complaints of ongoing rectal bleeding feels likely from hemorrhoids. -Hemoglobin of 5.4 from 7.4(05/27/2021).  Hemoglobin noted at 12.6 (01/30/2021) -Transfused 2 units packed red blood cells. -See above GI bleed.  Rectal bleeding- (present on admission) - See above GI bleed/symptomatic anemia.  Major depressive disorder, recurrent, in remission ([place])- (present on admission) - Resume home regimen once med rec is completed.  Hyperlipidemia associated with type 2 diabetes mellitus ([place])- (present on  admission) - Resume home regimen statin.  Uncontrolled type 2 diabetes mellitus with hyperglycemia ([place])- (present on admission) - Well-controlled type 2 diabetes mellitus -Hemoglobin A1c 5.2 (06/13/2021). -Hold oral hypoglycemic agents. -SSI every 4 hours  after midnight.  Severe anemia- (present on admission) - See symptomatic anemia.  Hyponatremia- (present on admission) - Patient with a chronic hyponatremia -Currently stable.  Follow-up.  Dehydration- (present on admission) - IV fluids. -Transfused 2 units packed red blood cells.  Anxiety disorder- (present on admission) - Resume home regimen Lexapro, Xanax as needed.  Essential hypertension- (present on admission) - Potassium medications for the next 24 hours and if blood pressure is elevated will resume home regimen of antihypertensive medications.  Seizure disorder [facility]) - Patient with no seizures noted. -Resume home regimen of antiseizure medications once med rec is completed.  Chronic hyponatremia- (present on admission) - Stable.         DVT prophylaxis: SCDs Code Status:   FULL Family Communication:  Updated patient, no family at bedside. Disposition Plan:   Patient is from:  Home  Anticipated DC to:  Home  Anticipated DC date:  3 to 4 days  Anticipated DC barriers: Ongoing rectal bleeding  Consults called:  Gastroenterology: Dr. Vicente Males Admission status:  Admit to inpatient.  Severity of Illness: The appropriate patient status for this patient is INPATIENT. Inpatient status is judged to be reasonable and necessary in order to provide the required intensity of service to ensure the patient's safety. The patient's presenting symptoms, physical exam findings, and initial radiographic and laboratory data in the context of their chronic comorbidities is felt to place them at high risk for further clinical deterioration. Furthermore, it is not anticipated that the patient will be medically stable for discharge from  the hospital within 2 midnights of admission.   * I certify that at the point of admission it is my clinical judgment that the patient will require inpatient hospital care spanning beyond 2 midnights from the point of admission due to high intensity of service, high risk for further deterioration and high frequency of surveillance required.*    Irine Seal MD [facility]  How to contact the [facility] Attending or Consulting provider [place] or covering provider during after hours [place], for this patient?   Check the care team in [facility] and look for a) attending/consulting [facility] provider listed and b) the [facility] team listed Log into [email or web address] and use 's universal password to access. If you do not have the password, please contact the hospital operator. Locate the [facility] provider you are looking for under [facility] and page to a number that you can be directly reached. If you still have difficulty reaching the provider, please page the [facility] (Director on Call) for the Hospitalists listed on amion for assistance.  06/13/2021, 6:52 PM

## 2021-06-13 NOTE — Assessment & Plan Note (Addendum)
-  Patient maintained on home regimen Lexapro, Xanax as needed.

## 2021-06-13 NOTE — Telephone Encounter (Signed)
Called and spoke to pt's daughter Christina French) and advised that we are direct admitting Christina French to hospital due to her labs being low.  I informed daughter that once we get the information for room and so forth at hospital that we will be calling her back to let them know when to go to hospital.  Daughter advised to call her to give information on the admitting

## 2021-06-13 NOTE — Assessment & Plan Note (Addendum)
-   Patient hydrated with IV fluids.  -Status post transfusion 3 units packed red blood cells.

## 2021-06-13 NOTE — Assessment & Plan Note (Addendum)
-  Patient maintained on home regimen Lexapro.

## 2021-06-13 NOTE — Assessment & Plan Note (Signed)
-   See symptomatic anemia.

## 2021-06-13 NOTE — Assessment & Plan Note (Addendum)
-   Patient with a chronic hyponatremia -Outpatient follow-up.

## 2021-06-13 NOTE — Assessment & Plan Note (Addendum)
-   Patient with no seizures noted. -Patient was maintained on home regimen of antiseizure medications.

## 2021-06-13 NOTE — Assessment & Plan Note (Signed)
Stable

## 2021-06-13 NOTE — Assessment & Plan Note (Addendum)
-   Patient presented with generalized fatigue, lightheadedness, shortness of breath, pallor. -Patient with complaints of ongoing rectal bleeding feels likely from hemorrhoids early on during the hospitalization. -Hemoglobin of 5.4 on presentation, from 7.4(05/27/2021).  Hemoglobin noted at 12.6 (01/30/2021). -Status post transfusion 3 units packed red blood cells with hemoglobin stabilizing at 8.3 by day of discharge. -Patient with improvement with bloody bowel movements post hemorrhoidectomy (06/15/2021)/ -Anemia panel consistent with severe iron deficiency anemia. -Patient received IV Feraheme x1 during the hospitalization.  -See above GI bleed.   -Outpatient follow-up with PCP.

## 2021-06-13 NOTE — Progress Notes (Signed)
Called blood bank to check on the status of her pRBC. Per blood bank, it should be ready in next 60 minutes. Pre-meds given now.

## 2021-06-13 NOTE — Assessment & Plan Note (Addendum)
-   Patient presented as a direct admission from PCPs office with several weeks of bright red blood per rectum with every bowel movement. -Patient feels likely hemorrhoidal in nature as she states has hemorrhoids the size of grapefruit. -Patient has been worked up for GI bleed in the past with a negative capsule endoscopy (01/06/2019), upper endoscopy done 12/21/2018 with Barrett's stage C 4 otherwise within normal limits.  Colonoscopy done 12/21/2020 with nonbleeding internal hemorrhoids noted. -Patient presenting from PCPs office with a hemoglobin of 5.4 from 7.4(05/27/2021).  Hemoglobin at 12.6 (01/30/2021). -Patient with complaints of generalized fatigue, shortness of breath, noted to have pallor on admission which have improved posttransfusion and had resolved by day of discharge.. -Patient vitals within normal limits and patient not tachycardic on admission. -Anemia panel consistent with severe iron deficiency anemia with iron level of 16, ferritin of 3. -Patient transfused 3 units packed red blood cells with hemoglobin currently at 8.8. -Patient maintained on PPI during the hospitalization. -Patient seen in consultation by GI, who noted patient has failed conservative management and recommended options of hemorrhoidal banding versus surgical hemorrhoidectomy. -Per GI patient noted to have refused banding at the office and wanted surgical hemorrhoidectomy. -GI recommended sitz bath, Anusol, high-fiber diet with the aim of 25 g of fiber per day. -GI recommended surgical consultation. -Patient seen in consultation by general surgery who has assessed the patient and patient adamant on hemorrhoidectomy and as such patient underwent hemorrhoidectomy all 3 columns on 06/15/2021 without any complications.   -Postoperatively patient doing well, denied any gross bleeding, endorsed some oozing of blood from hemorrhoidectomy site which improved daily. -Patient received IV Feraheme 510 mg x 1 on 06/16/2021 without  any complications. -Patient was seen by GI who signed off. -Patient resumed on home regimen oral iron supplementation. -Patient improved clinically, hemoglobin stabilized at 8.3 by day of discharge. -Patient was seen and followed by general surgery during the hospitalization and will be discharged home in stable and improved condition with outpatient follow-up with general surgery 2 weeks postdischarge.

## 2021-06-13 NOTE — Assessment & Plan Note (Signed)
-   See above GI bleed/symptomatic anemia.

## 2021-06-14 DIAGNOSIS — D509 Iron deficiency anemia, unspecified: Secondary | ICD-10-CM

## 2021-06-14 DIAGNOSIS — K922 Gastrointestinal hemorrhage, unspecified: Secondary | ICD-10-CM

## 2021-06-14 DIAGNOSIS — K648 Other hemorrhoids: Secondary | ICD-10-CM

## 2021-06-14 LAB — COMPREHENSIVE METABOLIC PANEL
ALT: 8 U/L (ref 0–44)
AST: 10 U/L — ABNORMAL LOW (ref 15–41)
Albumin: 3.1 g/dL — ABNORMAL LOW (ref 3.5–5.0)
Alkaline Phosphatase: 36 U/L — ABNORMAL LOW (ref 38–126)
Anion gap: 6 (ref 5–15)
BUN: 8 mg/dL (ref 8–23)
CO2: 24 mmol/L (ref 22–32)
Calcium: 8.5 mg/dL — ABNORMAL LOW (ref 8.9–10.3)
Chloride: 102 mmol/L (ref 98–111)
Creatinine, Ser: 0.87 mg/dL (ref 0.44–1.00)
GFR, Estimated: >60 mL/min (ref >60)
Glucose, Bld: 94 mg/dL (ref 70–99)
Potassium: 3.7 mmol/L (ref 3.5–5.1)
Sodium: 132 mmol/L — ABNORMAL LOW (ref 135–145)
Total Bilirubin: 1.1 mg/dL (ref 0.3–1.2)
Total Protein: 5.2 g/dL — ABNORMAL LOW (ref 6.5–8.1)

## 2021-06-14 LAB — HEMOGLOBIN AND HEMATOCRIT, BLOOD
HCT: 25.5 % — ABNORMAL LOW (ref 36.0–46.0)
HCT: 27.2 % — ABNORMAL LOW (ref 36.0–46.0)
Hemoglobin: 7.8 g/dL — ABNORMAL LOW (ref 12.0–15.0)
Hemoglobin: 8.6 g/dL — ABNORMAL LOW (ref 12.0–15.0)

## 2021-06-14 LAB — CBC
HCT: 20.2 % — ABNORMAL LOW (ref 36.0–46.0)
Hemoglobin: 6 g/dL — ABNORMAL LOW (ref 12.0–15.0)
MCH: 21.7 pg — ABNORMAL LOW (ref 26.0–34.0)
MCHC: 29.7 g/dL — ABNORMAL LOW (ref 30.0–36.0)
MCV: 73.2 fL — ABNORMAL LOW (ref 80.0–100.0)
Platelets: 129 10*3/uL — ABNORMAL LOW (ref 150–400)
RBC: 2.76 MIL/uL — ABNORMAL LOW (ref 3.87–5.11)
RDW: 18.2 % — ABNORMAL HIGH (ref 11.5–15.5)
WBC: 4.6 10*3/uL (ref 4.0–10.5)
nRBC: 0 % (ref 0.0–0.2)

## 2021-06-14 LAB — PROTIME-INR
INR: 1.1 (ref 0.8–1.2)
Prothrombin Time: 14.4 seconds (ref 11.4–15.2)

## 2021-06-14 LAB — VITAMIN B12: Vitamin B-12: 1310 pg/mL — ABNORMAL HIGH (ref 180–914)

## 2021-06-14 LAB — MAGNESIUM: Magnesium: 1.2 mg/dL — ABNORMAL LOW (ref 1.7–2.4)

## 2021-06-14 LAB — PHOSPHORUS: Phosphorus: 4.2 mg/dL (ref 2.5–4.6)

## 2021-06-14 MED ORDER — HYDROCORTISONE (PERIANAL) 2.5 % EX CREA
TOPICAL_CREAM | Freq: Three times a day (TID) | CUTANEOUS | Status: DC
  Filled 2021-06-14: qty 28.35

## 2021-06-14 MED ORDER — SODIUM CHLORIDE 0.9% IV SOLUTION: Freq: Once | INTRAVENOUS | Status: AC

## 2021-06-14 MED ORDER — FUROSEMIDE 10 MG/ML IJ SOLN: 20.0000 mg | Freq: Once | INTRAMUSCULAR | Status: DC

## 2021-06-14 MED ORDER — ACETAMINOPHEN 325 MG PO TABS
650.0000 mg | ORAL_TABLET | Freq: Once | ORAL | Status: AC
  Administered 2021-06-14: 650 mg via ORAL
  Filled 2021-06-14: qty 2

## 2021-06-14 MED ORDER — MAGNESIUM SULFATE 4 GM/100ML IV SOLN
4.0000 g | Freq: Once | INTRAVENOUS | Status: AC
  Administered 2021-06-14: 4 g via INTRAVENOUS
  Filled 2021-06-14: qty 100

## 2021-06-14 MED ORDER — DIPHENHYDRAMINE HCL 25 MG PO CAPS
25.0000 mg | ORAL_CAPSULE | Freq: Once | ORAL | Status: AC
  Administered 2021-06-14: 25 mg via ORAL
  Filled 2021-06-14: qty 1

## 2021-06-14 NOTE — Anesthesia Preprocedure Evaluation (Addendum)
Anesthesia Evaluation  Patient identified by MRN, date of birth, ID band Patient awake    Reviewed: Allergy & Precautions, NPO status , Patient's Chart, lab work & pertinent test results, reviewed documented beta blocker date and time   Airway Mallampati: II  TM Distance: >3 FB Neck ROM: full    Dental no notable dental hx.    Pulmonary shortness of breath and with exertion, sleep apnea ,    Pulmonary exam normal        Cardiovascular Exercise Tolerance: Poor hypertension, Pt. on home beta blockers and Pt. on medications + DOE  Normal cardiovascular exam  ECHO 2/23: INTERPRETATION  NORMAL LEFT VENTRICULAR SYSTOLIC FUNCTION  NORMAL RIGHT VENTRICULAR SYSTOLIC FUNCTION  MILD VALVULAR REGURGITATION  NO VALVULAR STENOSIS    EKG 1/23: Normal sinus rhythm  Nonspecific T wave abnormalities  Abnormal ECG  No previous ECGs available     Neuro/Psych Seizures - (one this month), Well Controlled,  PSYCHIATRIC DISORDERS Anxiety Depression  Neuromuscular disease (Low back pain of thoracolumbar region with sciatica)    GI/Hepatic Neg liver ROS, GERD  Medicated and Poorly Controlled,hemorrhoids   Endo/Other  diabetes, Well Controlled, Type 2, Oral Hypoglycemic Agents  Renal/GU      Musculoskeletal  (+) Arthritis ,   Abdominal (+) + obese,   Peds  Hematology  (+) Blood dyscrasia, anemia , Iron studies show features of severe iron deficiency anemia likely secondary to chronic blood loss from internal hemorrhoids  Thrombocytopenia  S/p rbc transfusion   Anesthesia Other Findings Past Medical History: No date: Anemia No date: Anxiety No date: Arthritis No date: Barrett esophagus 2016 : Cancer ([place])     Comment:  Skin cancer right leg mylenoma No date: Depression 04/25/2017: Diabetes ([place]) No date: GERD (gastroesophageal reflux disease) No date: History of blood transfusion     Comment:  with a surgery No date:  History of bronchitis No date: History of pneumonia No date: Hypertension No date: IBS (irritable bowel syndrome) 09/27/15: Pneumonia No date: PTSD (post-traumatic stress disorder) No date: Renal insufficiency     Comment:  reports acute kidney injury No date: Seizures ([place])     Comment:  rare - last one 07/30/14 - was very anemic.  Silent               seziure -June 2016-  No date: Vertigo     Comment:  thinks related to sinus issues  Past Surgical History: No date: ABDOMINAL HYSTERECTOMY No date: CESAREAN SECTION 08/31/2014: COLONOSCOPY; N/A     Comment:  Procedure: COLONOSCOPY;  Surgeon: Lucilla Lame, MD;                Location: [place];  Service:               Gastroenterology;  Laterality: N/A; 08/05/2016: COLONOSCOPY; N/A     Comment:  Procedure: COLONOSCOPY;  Surgeon: [name], MD;                Location: [facility] ENDOSCOPY;  Service: Endoscopy;                Laterality: N/A; 12/22/2018: COLONOSCOPY WITH PROPOFOL; N/A     Comment:  Procedure: COLONOSCOPY WITH PROPOFOL;  Surgeon:               Virgel Manifold, MD;  Location: [facility] ENDOSCOPY;                Service: Endoscopy;  Laterality: N/A; 2011: CYST EXCISION  Comment:  from throat 08/31/2014: ESOPHAGOGASTRODUODENOSCOPY; N/A     Comment:  Procedure: ESOPHAGOGASTRODUODENOSCOPY (EGD);  Surgeon:               Lucilla Lame, MD;  Location: [place];                Service: Gastroenterology;  Laterality: N/A; 12/21/2018: ESOPHAGOGASTRODUODENOSCOPY; N/A     Comment:  Procedure: ESOPHAGOGASTRODUODENOSCOPY (EGD);  Surgeon:               Virgel Manifold, MD;  Location: [facility] ENDOSCOPY;                Service: Endoscopy;  Laterality: N/A; 07/01/2017: ESOPHAGOGASTRODUODENOSCOPY (EGD) WITH PROPOFOL; N/A     Comment:  Procedure: ESOPHAGOGASTRODUODENOSCOPY (EGD) WITH               PROPOFOL;  Surgeon: [name], MD;  Location: [facility]               ENDOSCOPY;  Service: Gastroenterology;  Laterality:  N/A; 07/01/2017: FLEXIBLE SIGMOIDOSCOPY; N/A     Comment:  Procedure: FLEXIBLE SIGMOIDOSCOPY;  Surgeon: [name], MD;  Location: [facility] ENDOSCOPY;  Service:               Gastroenterology;  Laterality: N/A; 12/22/2018: GIVENS CAPSULE STUDY     Comment:  Procedure: GIVENS CAPSULE STUDY;  Surgeon: Virgel Manifold, MD;  Location: [facility] ENDOSCOPY;  Service:               Endoscopy;; No date: HEMORRHOID SURGERY 11/25/2014: INCISION AND DRAINAGE HIP; Right     Comment:  Procedure: [place] WITH               PLACEMENT OF ANTIBIOUTIC BEADS;  Surgeon: Mcarthur Rossetti, MD;  Location: [place];  Service: Orthopedics;                Laterality: Right; 11/28/2014: INCISION AND DRAINAGE HIP; Right     Comment:  Procedure: Repeat I&D Right Hip;  Surgeon: Mcarthur Rossetti, MD;  Location: [place];  Service: Orthopedics;                Laterality: Right; 2007: JOINT REPLACEMENT; Right     Comment:  hip 04/04/2019: LOOP RECORDER INSERTION; N/A     Comment:  Procedure: LOOP RECORDER INSERTION;  Surgeon: Isaias Cowman, MD;  Location: [place] CV LAB;  Service:              Cardiovascular;  Laterality: N/A; 03/19/2015: TOTAL HIP REVISION; Right     Comment:  Procedure: RIGHT TOTAL HIP ARTHROPLASTY REVISION;                Surgeon: Meredith Pel, MD;  Location: [place];                Service: Orthopedics;  Laterality: Right; 11/18/2015: TOTAL HIP REVISION; Right     Comment:  Procedure: TOTAL HIP REVISION;  Surgeon: Frederik Pear,               MD;  Location:  [place] [facility];  Service: Orthopedics;  Laterality:              Right;  BMI    Body Mass Index: 33.73 kg/m      Reproductive/Obstetrics negative OB ROS                         Anesthesia Physical Anesthesia Plan  ASA: 3  Anesthesia Plan: General LMA   Post-op Pain Management: Regional block* and Ofirmev IV  (intra-op)*   Induction: Intravenous  PONV Risk Score and Plan: Dexamethasone, Ondansetron, Midazolam and Treatment may vary due to age or medical condition  Airway Management Planned: LMA  Additional Equipment:   Intra-op Plan:   Post-operative Plan: Extubation in OR  Informed Consent: I have reviewed the patients History and Physical, chart, labs and discussed the procedure including the risks, benefits and alternatives for the proposed anesthesia with the patient or authorized representative who has indicated his/her understanding and acceptance.     Dental advisory given  Plan Discussed with: Anesthesiologist, CRNA and Surgeon  Anesthesia Plan Comments:       Anesthesia Quick Evaluation

## 2021-06-14 NOTE — Progress Notes (Signed)
PT Cancellation Note  Patient Details Name: ASYAH CANDLER MRN: [id number] DOB: 1955-10-30   Cancelled Treatment:    Reason Eval/Treat Not Completed: Other (comment).  PT consult received.  Chart reviewed.  Upon PT arrival to pt's room, nursing just starting blood transfusion so deferred PT session.  Will check back on pt at a later date/time (although anticipate no PT needs per discussion with OT).  [name], PT 06/14/21, 11:38 AM

## 2021-06-14 NOTE — Hospital Course (Signed)
Patient is a pleasant 66 year old female history of hemorrhoids, diabetes, hypertension, seizure disorder, history of back presenting as a direct admission from PCPs office with ongoing rectal bleeding times several weeks, symptomatic anemia, hemoglobin noted at 5.4 from 7.4 (05/27/2021).  Patient admitted, transfused 3 units packed red blood cells.  GI consulted recommended hemorrhoidal banding versus surgical hemorrhoidectomy however patient adamant for surgical hemorrhoidectomy and as such general surgery consulted.  Patient scheduled for hemorrhoidectomy 06/15/2021.

## 2021-06-14 NOTE — Assessment & Plan Note (Addendum)
-   Magnesium at 1.2 on presentation.. -Phosphorus at 4.2. -Magnesium was repleted during the hospitalization.  -Outpatient follow-up with PCP.

## 2021-06-14 NOTE — Consult Note (Signed)
Date of Consultation:  06/14/2021  Requesting Physician:  Irine Seal, MD  Reason for Consultation:  Bleeding hemorrhoids  History of Present Illness: Christina French is a 66 y.o. female admitted yesterday to the hospitalist team for lower GI bleed.  The patient does have a history of internal hemorrhoids that have been bleeding for a long time.  She reports that she has had a history of hemorrhoid issues for many many years since her last childbirth which was about 34 years ago.  She has had 1 prior surgery for hemorrhoidectomy many years ago as well but reports that shortly after she started bleeding again.  She reports that she has hard stools but does not feel she has to strain much and has a bowel movement every day.  Denies any prolonged sitting on the toilet she does not drive due to history of seizures.  She has had prior colonoscopies and upper endoscopies which have not revealed any other bleeding sources.  Her last colonoscopy was on 12/22/2018 by Dr. Bonna Gains.  She had seen her PCP yesterday with worsening shortness of breath and fatigue.  She had a recent visit to the emergency room on 1/31 but was not seen although lab work at that point showed a hemoglobin of 7.4.  She had told her PCP that she has been " pouring blood when I go to the bathroom".  Lab work at that point showed a hemoglobin of 5.4 and she was directly admitted for further evaluation.  She has received a few units of red blood cells today and this morning her hemoglobin was up to 7.8.  She is currently receiving 1 more unit.  She feels better now her hemoglobin is higher.  She reports having 2 bowel movements today both of which have blood.  She has seen Dr. Vicente Males with gastroenterology in the past as well and she was offered banding of her hemorrhoids but she has declined and would rather pursue hemorrhoidectomy instead.  Past Medical History: Past Medical History:  Diagnosis Date   Anemia    Anxiety    Arthritis     Barrett esophagus    Cancer ([place]) 2016    Skin cancer right leg mylenoma   Depression    Diabetes ([place]) 04/25/2017   GERD (gastroesophageal reflux disease)    History of blood transfusion    with a surgery   History of bronchitis    History of pneumonia    Hypertension    IBS (irritable bowel syndrome)    Pneumonia 09/27/15   PTSD (post-traumatic stress disorder)    Renal insufficiency    reports acute kidney injury   Seizures ([place])    rare - last one 07/30/14 - was very anemic.  Silent seziure -June 2016-    Vertigo    thinks related to sinus issues     Past Surgical History: Past Surgical History:  Procedure Laterality Date   ABDOMINAL HYSTERECTOMY     CESAREAN SECTION     COLONOSCOPY N/A 08/31/2014   Procedure: COLONOSCOPY;  Surgeon: Lucilla Lame, MD;  Location: [place];  Service: Gastroenterology;  Laterality: N/A;   COLONOSCOPY N/A 08/05/2016   Procedure: COLONOSCOPY;  Surgeon: [name], MD;  Location: [facility] ENDOSCOPY;  Service: Endoscopy;  Laterality: N/A;   COLONOSCOPY WITH PROPOFOL N/A 12/22/2018   Procedure: COLONOSCOPY WITH PROPOFOL;  Surgeon: Virgel Manifold, MD;  Location: [facility] ENDOSCOPY;  Service: Endoscopy;  Laterality: N/A;   CYST EXCISION  2011   from throat  ESOPHAGOGASTRODUODENOSCOPY N/A 08/31/2014   Procedure: ESOPHAGOGASTRODUODENOSCOPY (EGD);  Surgeon: Lucilla Lame, MD;  Location: [place];  Service: Gastroenterology;  Laterality: N/A;   ESOPHAGOGASTRODUODENOSCOPY N/A 12/21/2018   Procedure: ESOPHAGOGASTRODUODENOSCOPY (EGD);  Surgeon: Virgel Manifold, MD;  Location: [facility] ENDOSCOPY;  Service: Endoscopy;  Laterality: N/A;   ESOPHAGOGASTRODUODENOSCOPY (EGD) WITH PROPOFOL N/A 07/01/2017   Procedure: ESOPHAGOGASTRODUODENOSCOPY (EGD) WITH PROPOFOL;  Surgeon: [name], MD;  Location: [facility] ENDOSCOPY;  Service: Gastroenterology;  Laterality: N/A;   FLEXIBLE SIGMOIDOSCOPY N/A 07/01/2017   Procedure: FLEXIBLE SIGMOIDOSCOPY;  Surgeon: [name], MD;  Location: [facility] ENDOSCOPY;  Service: Gastroenterology;  Laterality: N/A;   GIVENS CAPSULE STUDY  12/22/2018   Procedure: GIVENS CAPSULE STUDY;  Surgeon: Virgel Manifold, MD;  Location: [facility] ENDOSCOPY;  Service: Endoscopy;;   HEMORRHOID SURGERY     INCISION AND DRAINAGE HIP Right 11/25/2014   Procedure: IRRIGATION  AND DRAINAGEOF RIGHT HIP WITH PLACEMENT OF ANTIBIOUTIC BEADS;  Surgeon: Mcarthur Rossetti, MD;  Location: [place];  Service: Orthopedics;  Laterality: Right;   INCISION AND DRAINAGE HIP Right 11/28/2014   Procedure: Repeat I&D Right Hip;  Surgeon: Mcarthur Rossetti, MD;  Location: [place];  Service: Orthopedics;  Laterality: Right;   JOINT REPLACEMENT Right 2007   hip   LOOP RECORDER INSERTION N/A 04/04/2019   Procedure: LOOP RECORDER INSERTION;  Surgeon: Isaias Cowman, MD;  Location: [place] CV LAB;  Service: Cardiovascular;  Laterality: N/A;   TOTAL HIP REVISION Right 03/19/2015   Procedure: RIGHT TOTAL HIP ARTHROPLASTY REVISION;  Surgeon: Meredith Pel, MD;  Location: [place];  Service: Orthopedics;  Laterality: Right;   TOTAL HIP REVISION Right 11/18/2015   Procedure: TOTAL HIP REVISION;  Surgeon: Frederik Pear, MD;  Location: [place];  Service: Orthopedics;  Laterality: Right;    Home Medications: Prior to Admission medications   Medication Sig Start Date End Date Taking? Authorizing Provider  ASPIRIN LOW DOSE 81 MG EC tablet TAKE 1 TABLET BY MOUTH EVERY MORNING 04/22/21  Yes Abernathy, Alyssa, NP  atenolol (TENORMIN) 50 MG tablet TAKE 1 TABLET BY MOUTH TWICE A DAY 04/08/21  Yes Abernathy, Alyssa, NP  escitalopram (LEXAPRO) 20 MG tablet Take 20 mg by mouth daily.    Yes [provider]  famotidine (PEPCID) 20 MG tablet TAKE 1 TABLET BY MOUTH TWICE A DAY 05/26/21  Yes Moye, [place], MD  ferrous sulfate 325 (65 FE) MG EC tablet Take 1 tablet (325 mg total) by mouth 2 (two) times daily. 12/23/18 06/13/21 Yes Pyreddy, Reatha Harps, MD  gabapentin  (NEURONTIN) 100 MG capsule TAKE ONE CAPSULE BY MOUTH TWICE A DAY 05/26/21  Yes Abernathy, Yetta Flock, NP  glimepiride (AMARYL) 2 MG tablet Take 1 tablet (2 mg total) by mouth daily before breakfast. 02/11/21  Yes [name], MD  Lacosamide 150 MG TABS Take 150 mg by mouth 2 (two) times daily.    Yes [provider]  levETIRAcetam (KEPPRA) 500 MG tablet Take 500 mg by mouth 2 (two) times daily.   Yes [provider]  lisinopril (ZESTRIL) 5 MG tablet TAKE 1 TABLET BY MOUTH DAILY 01/14/21  Yes [name], MD  metFORMIN (GLUCOPHAGE) 500 MG tablet TAKE 1 TABLET BY MOUTH TWICE A DAY WITH A MEAL 05/21/21  Yes Abernathy, Alyssa, NP  pantoprazole (PROTONIX) 40 MG tablet Take 1 tablet (40 mg total) by mouth 2 (two) times daily. 06/13/21  Yes Abernathy, Yetta Flock, NP  propranolol (INDERAL) 10 MG tablet Take 10 mg by mouth 3 (three) times  daily. 06/03/21  Yes [provider]  rosuvastatin (CRESTOR) 5 MG tablet TAKE 1 TABLET BY MOUTH DAILY 01/14/21  Yes [name], MD  traZODone (DESYREL) 150 MG tablet Take 150 mg by mouth at bedtime. 06/03/21  Yes [provider]  Accu-Chek Softclix Lancets lancets Use as instructed twice a day Dx E11.65 01/11/20   Ronnell Freshwater, NP  acetaminophen (TYLENOL) 500 MG tablet Take 1,000 mg by mouth 2 (two) times daily as needed for mild pain or headache.    [provider]  ALPRAZolam Duanne Moron) 1 MG tablet Take 1 tablet (1 mg total) by mouth 3 (three) times daily as needed for anxiety. 10/15/20   Lavina Hamman, MD  augmented betamethasone dipropionate (DIPROLENE) 0.05 % ointment Apply topically 2 (two) times daily. 01/30/21   [name], MD  cetirizine (ZYRTEC) 10 MG tablet TAKE 1 TABLET BY MOUTH TWICE A DAY FOR RASH. IF RASH AND ITCH STILL BOTHERSOME AND NO SIDE EFFECTS (DRY MOUTH, BLURRY VISION), INCREASE TO 2 TABS TWICE A DAY. 04/22/21   Moye, [place], MD  Continuous Blood Gluc Receiver (Santa [place]) DEVI Use as directed DX ell.65  01/30/21   [name], MD  Continuous Blood Gluc Sensor (DEXCOM G6 SENSOR) MISC Use every 10 days dx e11.65 01/30/21   [name], MD  Continuous Blood Gluc Transmit (DEXCOM G6 TRANSMITTER) MISC Use as directed 01/30/21   [name], MD  cyclobenzaprine (FLEXERIL) 10 MG tablet Take 1 tablet (10 mg total) by mouth 3 (three) times daily as needed for muscle spasms. 06/13/21   Jonetta Osgood, NP  glucose blood (ACCU-CHEK GUIDE) test strip Use as directed twice a strips twice a day E 11.65 01/11/20   Ronnell Freshwater, NP  hydrOXYzine (ATARAX) 25 MG tablet Take 25 mg by mouth 3 (three) times daily as needed. 06/03/21   [provider]  montelukast (SINGULAIR) 10 MG tablet Take 1 tablet (10 mg total) by mouth at bedtime. 02/27/21   Jonetta Osgood, NP    Allergies: Allergies  Allergen Reactions   Morphine And Related Other (See Comments)    Blacked out once    Social History:  reports that she has never smoked. She has never used smokeless tobacco. She reports that she does not drink alcohol and does not use drugs.   Family History: Family History  Problem Relation Age of Onset   Alcoholism Father    Throat cancer Mother    Breast cancer Maternal Aunt     Review of Systems: Review of Systems  Constitutional:  Negative for chills and fever.  HENT:  Negative for hearing loss.   Respiratory:  Negative for shortness of breath.   Cardiovascular:  Negative for chest pain.  Gastrointestinal:  Positive for blood in stool and constipation. Negative for abdominal pain, diarrhea, nausea and vomiting.  Genitourinary:  Negative for dysuria.  Musculoskeletal:  Negative for myalgias.  Skin:  Negative for rash.  Neurological:  Negative for dizziness.  Psychiatric/Behavioral:  Negative for depression.    Physical Exam BP 127/79    Pulse 82    Temp 98 F (36.7 C) (Oral)    Resp 18    Ht 5\' 1"  (1.549 m)    Wt 81 kg    SpO2 100%    BMI 33.73 kg/m  CONSTITUTIONAL: No acute distress,  well-nourished HEENT:  Normocephalic, atraumatic, extraocular motion intact. NECK: Trachea is midline, and there is no jugular venous distension. RESPIRATORY:  Normal respiratory  effort without pathologic use of accessory muscles. CARDIOVASCULAR: Regular rhythm and rate. RECTAL: MUSCULOSKELETAL:  Normal muscle strength and tone in all four extremities.  No peripheral edema or cyanosis. SKIN: Skin turgor is normal. There are no pathologic skin lesions.  NEUROLOGIC:  Motor and sensation is grossly normal.  Cranial nerves are grossly intact. PSYCH:  Alert and oriented to person, place and time. Affect is normal.  Laboratory Analysis: Results for orders placed or performed during the hospital encounter of 06/13/21 (from the past 24 hour(s))  Resp Panel by RT-PCR (Flu A&B, Covid) Nasopharyngeal Swab     Status: None   Collection Time: 06/13/21  6:30 PM   Specimen: Nasopharyngeal Swab; Nasopharyngeal(NP) swabs in vial transport medium  Result Value Ref Range   SARS Coronavirus 2 by RT PCR NEGATIVE NEGATIVE   Influenza A by PCR NEGATIVE NEGATIVE   Influenza B by PCR NEGATIVE NEGATIVE  Vitamin B12     Status: Abnormal   Collection Time: 06/13/21  6:44 PM  Result Value Ref Range   Vitamin B-12 1,310 (H) 180 - 914 pg/mL  Folate     Status: None   Collection Time: 06/13/21  6:44 PM  Result Value Ref Range   Folate 13.4 >5.9 ng/mL  Iron and TIBC     Status: Abnormal   Collection Time: 06/13/21  6:44 PM  Result Value Ref Range   Iron 16 (L) 28 - 170 ug/dL   TIBC 511 (H) 250 - 450 ug/dL   Saturation Ratios 3 (L) 10.4 - 31.8 %   UIBC 495 ug/dL  Ferritin     Status: Abnormal   Collection Time: 06/13/21  6:44 PM  Result Value Ref Range   Ferritin 3 (L) 11 - 307 ng/mL  Type and screen [place]     Status: None (Preliminary result)   Collection Time: 06/13/21  6:44 PM  Result Value Ref Range   ABO/RH(D) O NEG    Antibody Screen POS    Sample Expiration 06/16/2021,2359     Antibody Identification ANTI C ANTI K    Unit Number [id number]    Blood Component Type RBC LR PHER2    Unit division 00    Status of Unit ALLOCATED    Transfusion Status OK TO TRANSFUSE    Crossmatch Result COMPATIBLE    Donor AG Type NEGATIVE FOR KELL ANTIGEN NEGATIVE FOR C ANTIGEN    Unit Number [id number]    Blood Component Type RBC LR PHER2    Unit division 00    Status of Unit ISSUED    Transfusion Status OK TO TRANSFUSE    Crossmatch Result COMPATIBLE    Donor AG Type NEGATIVE FOR KELL ANTIGEN NEGATIVE FOR C ANTIGEN    Unit Number [id number]    Blood Component Type RED CELLS,LR    Unit division 00    Status of Unit ISSUED    Transfusion Status OK TO TRANSFUSE    Crossmatch Result COMPATIBLE    Donor AG Type NEGATIVE FOR KELL ANTIGEN NEGATIVE FOR C ANTIGEN    Unit Number [id number]    Blood Component Type RED CELLS,LR    Unit division 00    Status of Unit ISSUED,FINAL    Transfusion Status OK TO TRANSFUSE    Crossmatch Result COMPATIBLE    Donor AG Type NEGATIVE FOR KELL ANTIGEN NEGATIVE FOR C ANTIGEN    Unit Number [id number]    Blood Component Type RBC LR PHER1    Unit division 00  Status of Unit ALLOCATED    Transfusion Status OK TO TRANSFUSE    Crossmatch Result COMPATIBLE    Donor AG Type PENDING    Unit Number [id number]    Blood Component Type RED CELLS,LR    Unit division 00    Status of Unit ALLOCATED    Transfusion Status OK TO TRANSFUSE    Crossmatch Result COMPATIBLE    Donor AG Type PENDING   Prepare RBC (crossmatch)     Status: None   Collection Time: 06/13/21  6:44 PM  Result Value Ref Range   Order Confirmation      ORDER PROCESSED BY BLOOD BANK Performed at [facility], [address] [place], [place] [address]   Urinalysis, Routine w reflex microscopic Urine, Catheterized     Status: Abnormal   Collection Time: 06/13/21  7:30 PM  Result Value Ref Range   Color, Urine STRAW (A) YELLOW   APPearance CLEAR  (A) CLEAR   Specific Gravity, Urine 1.004 (L) 1.005 - 1.030   pH 7.0 5.0 - 8.0   Glucose, UA NEGATIVE NEGATIVE mg/dL   Hgb urine dipstick SMALL (A) NEGATIVE   Bilirubin Urine NEGATIVE NEGATIVE   Ketones, ur NEGATIVE NEGATIVE mg/dL   Protein, ur NEGATIVE NEGATIVE mg/dL   Nitrite NEGATIVE NEGATIVE   Leukocytes,Ua SMALL (A) NEGATIVE   RBC / HPF 0-5 0 - 5 RBC/hpf   WBC, UA 0-5 0 - 5 WBC/hpf   Bacteria, UA RARE (A) NONE SEEN   Squamous Epithelial / LPF 0-5 0 - 5  Glucose, capillary     Status: None   Collection Time: 06/13/21 11:58 PM  Result Value Ref Range   Glucose-Capillary 96 70 - 99 mg/dL  Magnesium     Status: Abnormal   Collection Time: 06/14/21  3:56 AM  Result Value Ref Range   Magnesium 1.2 (L) 1.7 - 2.4 mg/dL  Phosphorus     Status: None   Collection Time: 06/14/21  3:56 AM  Result Value Ref Range   Phosphorus 4.2 2.5 - 4.6 mg/dL  Comprehensive metabolic panel     Status: Abnormal   Collection Time: 06/14/21  3:56 AM  Result Value Ref Range   Sodium 132 (L) 135 - 145 mmol/L   Potassium 3.7 3.5 - 5.1 mmol/L   Chloride 102 98 - 111 mmol/L   CO2 24 22 - 32 mmol/L   Glucose, Bld 94 70 - 99 mg/dL   BUN 8 8 - 23 mg/dL   Creatinine, Ser 0.87 0.44 - 1.00 mg/dL   Calcium 8.5 (L) 8.9 - 10.3 mg/dL   Total Protein 5.2 (L) 6.5 - 8.1 g/dL   Albumin 3.1 (L) 3.5 - 5.0 g/dL   AST 10 (L) 15 - 41 U/L   ALT 8 0 - 44 U/L   Alkaline Phosphatase 36 (L) 38 - 126 U/L   Total Bilirubin 1.1 0.3 - 1.2 mg/dL   GFR, Estimated >60 >60 mL/min   Anion gap 6 5 - 15  CBC     Status: Abnormal   Collection Time: 06/14/21  3:56 AM  Result Value Ref Range   WBC 4.6 4.0 - 10.5 K/uL   RBC 2.76 (L) 3.87 - 5.11 MIL/uL   Hemoglobin 6.0 (L) 12.0 - 15.0 g/dL   HCT 20.2 (L) 36.0 - 46.0 %   MCV 73.2 (L) 80.0 - 100.0 fL   MCH 21.7 (L) 26.0 - 34.0 pg   MCHC 29.7 (L) 30.0 - 36.0 g/dL   RDW  18.2 (H) 11.5 - 15.5 %   Platelets 129 (L) 150 - 400 K/uL   nRBC 0.0 0.0 - 0.2 %  Protime-INR     Status: None    Collection Time: 06/14/21  3:56 AM  Result Value Ref Range   Prothrombin Time 14.4 11.4 - 15.2 seconds   INR 1.1 0.8 - 1.2  Prepare RBC (crossmatch)     Status: None   Collection Time: 06/14/21  8:55 AM  Result Value Ref Range   Order Confirmation      ORDER PROCESSED BY BLOOD BANK Performed at [facility], [address] [place], [place] [address]   Hemoglobin and hematocrit, blood     Status: Abnormal   Collection Time: 06/14/21  9:47 AM  Result Value Ref Range   Hemoglobin 7.8 (L) 12.0 - 15.0 g/dL   HCT 25.5 (L) 36.0 - 46.0 %    Imaging: No results found.  Assessment and Plan: This is a 66 y.o. female with symptomatic anemia from bleeding internal hemorrhoids.  - The patient has a very long history of chronic internal hemorrhoids with issues with bleeding for a long time.  Her hemoglobin on admission was very low and is now improved after blood transfusion.  Discussed with her potential options for banding of her internal hemorrhoids versus hemorrhoidectomy but the patient would prefer to do hemorrhoidectomy.  The patient reports that with banding, her impression is that only 1 column can be banded at a time and will need to go back to the office a few times in order to complete all columns.  However she has issues with transportation she cannot drive due to her history of seizures.  As such, she would prefer to proceed with hemorrhoidectomy were all columns could potentially be treated in the same setting.  Discussed with her the procedure at length which would include an exam under anesthesia so we can further evaluate the anal canal for any other potential sources of bleeding.  This will be done followed by hemorrhoidectomy of 2 or possibly 3 columns depending on how much narrowing there is of the anal canal after 2 columns are resected.  Discussed with her the risks of bleeding, infection, injury to surrounding structures, reviewed with her that this procedure can be  painful but we will be injecting numbing medication give her appropriate pain medications for this.  She reports that she has high tolerance to pain and should be well.  She is willing to proceed and all of her questions have been answered. - We will schedule her for hemorrhoidectomy tomorrow morning.  We will make her n.p.o. after midnight.  We will repeat CBC tomorrow to make sure her hemoglobin is appropriate for general anesthesia.  I spent 80 minutes dedicated to the care of this patient on the date of this encounter to include pre-visit review of records, face-to-face time with the patient discussing diagnosis and management, and any post-visit coordination of care.   Melvyn Neth, MD [facility] Pg:  [phone]

## 2021-06-14 NOTE — Progress Notes (Addendum)
PROGRESS NOTE    Christina French  QAS:[id number] DOB: 1955/11/24 DOA: 06/13/2021 PCP: [name], MD    No chief complaint on file.   Brief Narrative:  Patient is a pleasant 66 year old female history of hemorrhoids, diabetes, hypertension, seizure disorder, history of back presenting as a direct admission from PCPs office with ongoing rectal bleeding times several weeks, symptomatic anemia, hemoglobin noted at 5.4 from 7.4 (05/27/2021).  Patient admitted, transfused 3 units packed red blood cells.  GI consulted recommended hemorrhoidal banding versus surgical hemorrhoidectomy however patient adamant for surgical hemorrhoidectomy and as such general surgery consulted.  Patient scheduled for hemorrhoidectomy 06/15/2021.    Assessment & Plan:  Principal Problem:   GI bleed Active Problems:   Rectal bleeding   Symptomatic anemia   Chronic hyponatremia   Seizure disorder (HCC)   Essential hypertension   Anxiety disorder   Dehydration   Hyponatremia   Severe anemia   Uncontrolled type 2 diabetes mellitus with hyperglycemia (HCC)   Hyperlipidemia associated with type 2 diabetes mellitus (HCC)   Major depressive disorder, recurrent, in remission ([place])   Hypomagnesemia    Assessment and Plan: * GI bleed- (present on admission) - Patient presented as a direct admission from PCPs office with several weeks of bright red blood per rectum with every bowel movement. -Patient feels likely hemorrhoidal in nature as she states has hemorrhoids the size of grapefruit. -Patient has been worked up for GI bleed in the past with a negative capsule endoscopy (01/06/2019), upper endoscopy done 12/21/2018 with Barrett's stage C 4 otherwise within normal limits.  Colonoscopy done 12/21/2020 with nonbleeding internal hemorrhoids noted. -Patient presenting from PCPs office with a hemoglobin of 5.4 from 7.4(05/27/2021).  Hemoglobin at 12.6 (01/30/2021). -Patient with complaints of generalized fatigue, shortness  of breath, noted to have pallor. -Patient vitals within normal limits and patient not tachycardic on admission. -Anemia panel consistent with severe iron deficiency anemia with iron level of 16, ferritin of 3. -Patient transfused 2 units packed red blood cells with hemoglobin of 7.8. -Patient still with ongoing bloody bowel movements and as such we will transfuse 1 more unit packed red blood cells. -Continue IV PPI. -Patient placed on clear liquids this morning. -Patient seen in consultation by GI, who noted patient has failed conservative management and recommended options of hemorrhoidal banding versus surgical hemorrhoidectomy. -Per GI patient noted to have refused banding at the office and wanted surgical hemorrhoidectomy. -GI recommending sitz bath, Anusol, high-fiber diet with the aim of 25 g of fiber per day. -GI recommending surgical consultation. -Patient seen in consultation by general surgery who has assessed the patient and patient adamant on hemorrhoidectomy and as such patient will be scheduled for hemorrhoidectomy tomorrow 06/15/2021. -Will need IV iron during this hospitalization. -Placed on a diet and make n.p.o. after midnight in anticipation of hemorrhoidectomy. -Continue serial H&H. -GI has signed off. -General surgery following and appreciate input and recommendations.   Symptomatic anemia- (present on admission) - Patient presented with generalized fatigue, lightheadedness, shortness of breath, pallor. -Patient with complaints of ongoing rectal bleeding feels likely from hemorrhoids. -Hemoglobin of 5.4 on presentation, from 7.4(05/27/2021).  Hemoglobin noted at 12.6 (01/30/2021). -Status post transfusion 2 units packed red blood cells with hemoglobin at 7.8. -Transfuse 1 unit packed red blood cells. -Serial H&H. -Anemia panel consistent with severe iron deficiency anemia. -Will need IV iron during this hospitalization. -See above GI bleed.  Rectal bleeding- (present  on admission) - See above GI bleed/symptomatic anemia.  Hypomagnesemia- (present  on admission) - Magnesium at 1.2. -Phosphorus at 4.2. -Magnesium sulfate 4 g IV x1. -Repeat labs in the morning.  Major depressive disorder, recurrent, in remission ([place])- (present on admission) - Resume home regimen once med rec is completed.  Hyperlipidemia associated with type 2 diabetes mellitus ([place])- (present on admission) - Continue Crestor.    Uncontrolled type 2 diabetes mellitus with hyperglycemia ([place])- (present on admission) - Well-controlled type 2 diabetes mellitus -Hemoglobin A1c 5.2 (06/13/2021). -Continue to hold oral hypoglycemic agents. -SSI   Severe anemia- (present on admission) - See symptomatic anemia.  Hyponatremia- (present on admission) - Patient with a chronic hyponatremia -Currently stable.  Follow-up.  Dehydration- (present on admission) - IV fluids. -Status post transfusion 2 units packed red blood cells.  -Transfuse 1 unit packed red blood cells.    Anxiety disorder- (present on admission) - Continue home regimen Lexapro, Xanax as needed.  Essential hypertension- (present on admission) - Blood pressure borderline this morning. -Discontinue atenolol. -Continue propranolol  Seizure disorder ([place]) - Patient with no seizures noted. -Continue home regimen of antiseizure medications once med rec is completed.  Chronic hyponatremia- (present on admission) - Stable.         DVT prophylaxis: SCDs Code Status: Full Family Communication: Updated patient.  No family at bedside. Disposition: Likely home once clinically improved.  Status is: Inpatient Remains inpatient appropriate because: Severity of illness.           Consultants:  Gastroenterology: Dr. Vicente Males 06/14/2021 General surgery: Dr. Hampton Abbot 06/14/2021  Procedures:  None  Antimicrobials:  None   Subjective: Laying in bed.  Overall feeling better than she did on admission.  Just  completed second unit of packed red blood cells this morning.  No chest pain.  Feels shortness of breath and fatigue improving overall.  States continuing to have bloody bowel movements with stools last bloody bowel movement this morning.  Objective: Vitals:   06/14/21 0754 06/14/21 1112 06/14/21 1113 06/14/21 1150  BP: 123/70 108/62 108/72 127/79  Pulse: 83 82 86 82  Resp: 18  18 18   Temp: 98 F (36.7 C)  98.1 F (36.7 C) 98 F (36.7 C)  TempSrc:   Oral Oral  SpO2: 100% 100% 100% 100%  Weight:      Height:        Intake/Output Summary (Last 24 hours) at 06/14/2021 1422 Last data filed at 06/14/2021 0900 Gross per 24 hour  Intake 1998.36 ml  Output --  Net 1998.36 ml   Filed Weights   06/14/21 0536 06/14/21 0629  Weight: 82.9 kg 81 kg    Examination:  General exam: Appears calm and comfortable.  Pallor. Respiratory system: Clear to auscultation. Respiratory effort normal. Cardiovascular system: S1 & S2 heard, RRR. No JVD, murmurs, rubs, gallops or clicks. No pedal edema. Gastrointestinal system: Abdomen is nondistended, soft and decreased tenderness to palpation diffusely.  Positive bowel sounds.  No rebound.  No guarding.  Central nervous system: Alert and oriented. No focal neurological deficits. Extremities: Symmetric 5 x 5 power. Skin: No rashes, lesions or ulcers Psychiatry: Judgement and insight appear normal. Mood & affect appropriate.     Data Reviewed:   CBC: Recent Labs  Lab 06/13/21 1234 06/14/21 0356 06/14/21 0947  WBC 6.8 4.6  --   NEUTROABS 5.1  --   --   HGB 5.4* 6.0* 7.8*  HCT 19.5* 20.2* 25.5*  MCV 71.2* 73.2*  --   PLT 164 129*  --     Basic Metabolic Panel:  Recent Labs  Lab 06/13/21 1234 06/14/21 0356  NA 131* 132*  K 4.0 3.7  CL 97* 102  CO2 25 24  GLUCOSE 161* 94  BUN 10 8  CREATININE 0.93 0.87  CALCIUM 9.6 8.5*  MG  --  1.2*  PHOS  --  4.2    GFR: Estimated Creatinine Clearance: 63 mL/min (by C-G formula based on SCr of  0.87 mg/dL).  Liver Function Tests: Recent Labs  Lab 06/14/21 0356  AST 10*  ALT 8  ALKPHOS 36*  BILITOT 1.1  PROT 5.2*  ALBUMIN 3.1*    CBG: Recent Labs  Lab 06/13/21 2358  GLUCAP 96     Recent Results (from the past 240 hour(s))  Resp Panel by RT-PCR (Flu A&B, Covid) Nasopharyngeal Swab     Status: None   Collection Time: 06/13/21  6:30 PM   Specimen: Nasopharyngeal Swab; Nasopharyngeal(NP) swabs in vial transport medium  Result Value Ref Range Status   SARS Coronavirus 2 by RT PCR NEGATIVE NEGATIVE Final    Comment: (NOTE) SARS-CoV-2 target nucleic acids are NOT DETECTED.  The SARS-CoV-2 RNA is generally detectable in upper respiratory specimens during the acute phase of infection. The lowest concentration of SARS-CoV-2 viral copies this assay can detect is 138 copies/mL. A negative result does not preclude SARS-Cov-2 infection and should not be used as the sole basis for treatment or other patient management decisions. A negative result may occur with  improper specimen collection/handling, submission of specimen other than nasopharyngeal swab, presence of viral mutation(s) within the areas targeted by this assay, and inadequate number of viral copies(<138 copies/mL). A negative result must be combined with clinical observations, patient history, and epidemiological information. The expected result is Negative.  Fact Sheet for Patients:  EntrepreneurPulse.com.au  Fact Sheet for Healthcare Providers:  IncredibleEmployment.be  This test is no t yet approved or cleared by the [place] FDA and  has been authorized for detection and/or diagnosis of SARS-CoV-2 by FDA under an Emergency Use Authorization (EUA). This EUA will remain  in effect (meaning this test can be used) for the duration of the COVID-19 declaration under Section 564(b)(1) of the Act, 21 U.S.C.section 360bbb-3(b)(1), unless the authorization is terminated   or revoked sooner.       Influenza A by PCR NEGATIVE NEGATIVE Final   Influenza B by PCR NEGATIVE NEGATIVE Final    Comment: (NOTE) The Xpert Xpress SARS-CoV-2/FLU/RSV plus assay is intended as an aid in the diagnosis of influenza from Nasopharyngeal swab specimens and should not be used as a sole basis for treatment. Nasal washings and aspirates are unacceptable for Xpert Xpress SARS-CoV-2/FLU/RSV testing.  Fact Sheet for Patients: EntrepreneurPulse.com.au  Fact Sheet for Healthcare Providers: IncredibleEmployment.be  This test is not yet approved or cleared by the [place] FDA and has been authorized for detection and/or diagnosis of SARS-CoV-2 by FDA under an Emergency Use Authorization (EUA). This EUA will remain in effect (meaning this test can be used) for the duration of the COVID-19 declaration under Section 564(b)(1) of the Act, 21 U.S.C. section 360bbb-3(b)(1), unless the authorization is terminated or revoked.  Performed at [facility], [address], [place], [place] [address]          Radiology Studies: No results found.      Scheduled Meds:  escitalopram  20 mg Oral Daily   ferrous sulfate  325 mg Oral BID   gabapentin  100 mg Oral BID   hydrocortisone   Rectal TID  insulin aspart  0-9 Units Subcutaneous TID WC   lacosamide  150 mg Oral BID   levETIRAcetam  500 mg Oral BID   montelukast  10 mg Oral QHS   pantoprazole (PROTONIX) IV  40 mg Intravenous Q12H   propranolol  10 mg Oral TID   rosuvastatin  5 mg Oral Daily   sodium chloride flush  3 mL Intravenous Q12H   traZODone  150 mg Oral QHS   Continuous Infusions:  sodium chloride 75 mL/hr at 06/14/21 0936     LOS: 1 day    Time spent: 40 minutes    Irine Seal, MD [facility]   To contact the attending provider between 7A-7P or the covering provider during after hours 7P-7A, please log into the web site  [email or web address] and access using universal [place] password for that web site. If you do not have the password, please call the hospital operator.  06/14/2021, 2:22 PM

## 2021-06-14 NOTE — Consult Note (Signed)
Christina French , MD [address] [place], [place], [place], [address] [address], [place], [place], [place], [address] Phone: [phone]  Fax: [phone]  Consultation  Referring Provider: Dr Grandville Silos Primary Care Physician:  [name], MD Primary Gastroenterologist:  Dr. Phineas Semen         Reason for Consultation:     Anemia  Date of Admission:  06/13/2021 Date of Consultation:  06/14/2021         HPI:   Christina French is a 66 y.o. female Was seen by me back in 2018 when she presented with rectal bleeding.  She has a history of iron deficiency anemia at least going back to 2016.  She has history of colon polyps.  I had performed a colonoscopy in 2018 which was normal except for large internal hemorrhoids. She also had constipation which I tried treating her with Trulance.  I did her EGD which showed Barrett's esophagus.  I suggested to repeat EGD in 3 years.  Colonoscopy and capsule study showed no abnormalities.  She was advised undergo banding of her internal hemorrhoids.  She did not pursue with banding options.   She presented to the emergency room with complaints of rectal bleeding hemoglobin at her primary care physician's office was 5.4 g with an MCV of 71.2.  She says that she has had on and off bright red blood per rectum after bowel movements for years and is now ready to have her hemorroids treated. No change in bowel habits. No hematemesis or other areas of blood loss.    Past Medical History:  Diagnosis Date   Anemia    Anxiety    Arthritis    Barrett esophagus    Cancer ([place]) 2016    Skin cancer right leg mylenoma   Depression    Diabetes ([place]) 04/25/2017   GERD (gastroesophageal reflux disease)    History of blood transfusion    with a surgery   History of bronchitis    History of pneumonia    Hypertension    IBS (irritable bowel syndrome)    Pneumonia 09/27/15   PTSD (post-traumatic stress disorder)    Renal insufficiency    reports acute  kidney injury   Seizures ([place])    rare - last one 07/30/14 - was very anemic.  Silent seziure -June 2016-    Vertigo    thinks related to sinus issues    Past Surgical History:  Procedure Laterality Date   ABDOMINAL HYSTERECTOMY     CESAREAN SECTION     COLONOSCOPY N/A 08/31/2014   Procedure: COLONOSCOPY;  Surgeon: Lucilla Lame, MD;  Location: [facility];  Service: Gastroenterology;  Laterality: N/A;   COLONOSCOPY N/A 08/05/2016   Procedure: COLONOSCOPY;  Surgeon: Christina Bellows, MD;  Location: [facility] ENDOSCOPY;  Service: Endoscopy;  Laterality: N/A;   COLONOSCOPY WITH PROPOFOL N/A 12/22/2018   Procedure: COLONOSCOPY WITH PROPOFOL;  Surgeon: Virgel Manifold, MD;  Location: [facility] ENDOSCOPY;  Service: Endoscopy;  Laterality: N/A;   CYST EXCISION  2011   from throat   ESOPHAGOGASTRODUODENOSCOPY N/A 08/31/2014   Procedure: ESOPHAGOGASTRODUODENOSCOPY (EGD);  Surgeon: Lucilla Lame, MD;  Location: [facility];  Service: Gastroenterology;  Laterality: N/A;   ESOPHAGOGASTRODUODENOSCOPY N/A 12/21/2018   Procedure: ESOPHAGOGASTRODUODENOSCOPY (EGD);  Surgeon: Virgel Manifold, MD;  Location[facility] ENDOSCOPY;  Service: Endoscopy;  Laterality: N/A;   ESOPHAGOGASTRODUODENOSCOPY (EGD) WITH PROPOFOL N/A 07/01/2017   Procedure: ESOPHAGOGASTRODUODENOSCOPY (EGD) WITH PROPOFOL;  Surgeon: Christina Bellows, MD;  Location:  [facility] ENDOSCOPY;  Service: Gastroenterology;  Laterality: N/A;   FLEXIBLE SIGMOIDOSCOPY N/A 07/01/2017   Procedure: FLEXIBLE SIGMOIDOSCOPY;  Surgeon: Christina Bellows, MD;  Location: [facility] ENDOSCOPY;  Service: Gastroenterology;  Laterality: N/A;   GIVENS CAPSULE STUDY  12/22/2018   Procedure: GIVENS CAPSULE STUDY;  Surgeon: Virgel Manifold, MD;  Location: [facility] ENDOSCOPY;  Service: Endoscopy;;   HEMORRHOID SURGERY     INCISION AND DRAINAGE HIP Right 11/25/2014   Procedure: IRRIGATION  AND DRAINAGEOF RIGHT HIP WITH PLACEMENT OF ANTIBIOUTIC BEADS;  Surgeon: Mcarthur Rossetti, MD;  Location: [facility];  Service: Orthopedics;  Laterality: Right;   INCISION AND DRAINAGE HIP Right 11/28/2014   Procedure: Repeat I&D Right Hip;  Surgeon: Mcarthur Rossetti, MD;  Location: [facility];  Service: Orthopedics;  Laterality: Right;   JOINT REPLACEMENT Right 2007   hip   LOOP RECORDER INSERTION N/A 04/04/2019   Procedure: LOOP RECORDER INSERTION;  Surgeon: Isaias Cowman, MD;  Location: [facility];  Service: Cardiovascular;  Laterality: N/A;   TOTAL HIP REVISION Right 03/19/2015   Procedure: RIGHT TOTAL HIP ARTHROPLASTY REVISION;  Surgeon: Meredith Pel, MD;  Location: [facility];  Service: Orthopedics;  Laterality: Right;   TOTAL HIP REVISION Right 11/18/2015   Procedure: TOTAL HIP REVISION;  Surgeon: Frederik Pear, MD;  Location: [facility];  Service: Orthopedics;  Laterality: Right;    Prior to Admission medications   Medication Sig Start Date End Date Taking? Authorizing Provider  ASPIRIN LOW DOSE 81 MG EC tablet TAKE 1 TABLET BY MOUTH EVERY MORNING 04/22/21  Yes Abernathy, Alyssa, NP  atenolol (TENORMIN) 50 MG tablet TAKE 1 TABLET BY MOUTH TWICE A DAY 04/08/21  Yes Abernathy, Alyssa, NP  escitalopram (LEXAPRO) 20 MG tablet Take 20 mg by mouth daily.    Yes [provider]  famotidine (PEPCID) 20 MG tablet TAKE 1 TABLET BY MOUTH TWICE A DAY 05/26/21  Yes Moye, [place], MD  ferrous sulfate 325 (65 FE) MG EC tablet Take 1 tablet (325 mg total) by mouth 2 (two) times daily. 12/23/18 06/13/21 Yes Pyreddy, Reatha Harps, MD  gabapentin (NEURONTIN) 100 MG capsule TAKE ONE CAPSULE BY MOUTH TWICE A DAY 05/26/21  Yes Abernathy, Yetta Flock, NP  glimepiride (AMARYL) 2 MG tablet Take 1 tablet (2 mg total) by mouth daily before breakfast. 02/11/21  Yes [name], MD  Lacosamide 150 MG TABS Take 150 mg by mouth 2 (two) times daily.    Yes [provider]  levETIRAcetam (KEPPRA) 500 MG tablet Take 500 mg by mouth 2 (two) times daily.   Yes [provider]  lisinopril (ZESTRIL) 5 MG tablet  TAKE 1 TABLET BY MOUTH DAILY 01/14/21  Yes [name], MD  metFORMIN (GLUCOPHAGE) 500 MG tablet TAKE 1 TABLET BY MOUTH TWICE A DAY WITH A MEAL 05/21/21  Yes Abernathy, Alyssa, NP  pantoprazole (PROTONIX) 40 MG tablet Take 1 tablet (40 mg total) by mouth 2 (two) times daily. 06/13/21  Yes Abernathy, Yetta Flock, NP  propranolol (INDERAL) 10 MG tablet Take 10 mg by mouth 3 (three) times daily. 06/03/21  Yes [provider]  rosuvastatin (CRESTOR) 5 MG tablet TAKE 1 TABLET BY MOUTH DAILY 01/14/21  Yes [name], MD  traZODone (DESYREL) 150 MG tablet Take 150 mg by mouth at bedtime. 06/03/21  Yes [provider]  Accu-Chek Softclix Lancets lancets Use as instructed twice a day Dx E11.65 01/11/20   Ronnell Freshwater, NP  acetaminophen (TYLENOL) 500 MG tablet Take 1,000 mg  by mouth 2 (two) times daily as needed for mild pain or headache.    [provider]  ALPRAZolam Duanne Moron) 1 MG tablet Take 1 tablet (1 mg total) by mouth 3 (three) times daily as needed for anxiety. 10/15/20   Lavina Hamman, MD  augmented betamethasone dipropionate (DIPROLENE) 0.05 % ointment Apply topically 2 (two) times daily. 01/30/21   [name], MD  cetirizine (ZYRTEC) 10 MG tablet TAKE 1 TABLET BY MOUTH TWICE A DAY FOR RASH. IF RASH AND ITCH STILL BOTHERSOME AND NO SIDE EFFECTS (DRY MOUTH, BLURRY VISION), INCREASE TO 2 TABS TWICE A DAY. 04/22/21   Moye, [place], MD  Continuous Blood Gluc Receiver (Gregory) DEVI Use as directed DX ell.65 01/30/21   [name], MD  Continuous Blood Gluc Sensor (DEXCOM G6 SENSOR) MISC Use every 10 days dx e11.65 01/30/21   [name], MD  Continuous Blood Gluc Transmit (DEXCOM G6 TRANSMITTER) MISC Use as directed 01/30/21   [name], MD  cyclobenzaprine (FLEXERIL) 10 MG tablet Take 1 tablet (10 mg total) by mouth 3 (three) times daily as needed for muscle spasms. 06/13/21   Jonetta Osgood, NP  glucose blood (ACCU-CHEK GUIDE) test strip Use as directed twice  a strips twice a day E 11.65 01/11/20   Ronnell Freshwater, NP  hydrOXYzine (ATARAX) 25 MG tablet Take 25 mg by mouth 3 (three) times daily as needed. 06/03/21   [provider]  montelukast (SINGULAIR) 10 MG tablet Take 1 tablet (10 mg total) by mouth at bedtime. 02/27/21   Jonetta Osgood, NP    Family History  Problem Relation Age of Onset   Alcoholism Father    Throat cancer Mother    Breast cancer Maternal Aunt      Social History   Tobacco Use   Smoking status: Never   Smokeless tobacco: Never  Vaping Use   Vaping Use: Never used  Substance Use Topics   Alcohol use: No    Alcohol/week: 0.0 standard drinks   Drug use: No    Allergies as of 06/13/2021 - Review Complete 06/13/2021  Allergen Reaction Noted   Morphine and related Other (See Comments) 06/13/2021    Review of Systems:    All systems reviewed and negative except where noted in HPI.   Physical Exam:  Vital signs in last 24 hours: Temp:  [97.6 F (36.4 C)-98.4 F (36.9 C)] 98 F (36.7 C) (02/18 0754) Pulse Rate:  [83-91] 83 (02/18 0754) Resp:  [16-20] 18 (02/18 0754) BP: (98-156)/(53-82) 123/70 (02/18 0754) SpO2:  [97 %-100 %] 100 % (02/18 0754) Weight:  [80.8 kg-82.9 kg] 81 kg (02/18 0629) Last BM Date : 06/13/21 (per pt report) General:   Pleasant, cooperative in NAD Head:  Normocephalic and atraumatic. Eyes:   No icterus.   Conjunctiva pink. PERRLA. Ears:  Normal auditory acuity. Neck:  Supple; no masses or thyroidomegaly Lungs: Respirations even and unlabored. Lungs clear to auscultation bilaterally.   No wheezes, crackles, or rhonchi.  Heart:  Regular rate and rhythm;  Without murmur, clicks, rubs or gallops Abdomen:  Soft, nondistended, nontender. Normal bowel sounds. No appreciable masses or hepatomegaly.  No rebound or guarding.  Neurologic:  Alert and oriented x3;  grossly normal neurologically. Skin:  Intact without significant lesions or rashes. Cervical Nodes:  No significant  cervical adenopathy. Psych:  Alert and cooperative. Normal affect.  LAB RESULTS: Recent Labs    06/13/21 1234 06/14/21 0356  WBC 6.8 4.6  HGB 5.4* 6.0*  HCT 19.5* 20.2*  PLT 164 129*   BMET Recent Labs    06/13/21 1234 06/14/21 0356  NA 131* 132*  K 4.0 3.7  CL 97* 102  CO2 25 24  GLUCOSE 161* 94  BUN 10 8  CREATININE 0.93 0.87  CALCIUM 9.6 8.5*   LFT Recent Labs    06/14/21 0356  PROT 5.2*  ALBUMIN 3.1*  AST 10*  ALT 8  ALKPHOS 36*  BILITOT 1.1   PT/INR Recent Labs    06/14/21 0356  LABPROT 14.4  INR 1.1    STUDIES: No results found.    Impression / Plan:   Christina French is a 66 y.o. y/o female has a prior history of iron deficiency anemia going back to at least 2016.  I had performed an EGD and colonoscopy back in 2018 which only showed large internal hemorrhoids and Barrett's esophagus with no dysplasia.  She had a repeat colonoscopy and EGD in 2020 as well and capsule study of the small bowel showed no abnormality.  She was recommended to undergo Banding of her internal hemorrhoids back in 2020 but did not have it done.  Presents to the emergency room with a low hemoglobin of 5.4 g and of very low MCV of 71.  Iron studies show features of severe iron deficiency anemia likely secondary to chronic blood loss from internal hemorrhoids.  Plan 1.  She has failed conservative management and options for treatment of hemorrhoids are relatively less invasive procedures of hemorrhoidal banding at the office which we can perform or surgical hemorrhoidectomy. She refused banding at our o\ffice  and wants surgical hemorrhoidectomy.  In addition would recommend conservative management such as high-fiber diet with the aim of 25 g of fiber per day.  Sitz bath, Anusol .   2. Recommend IV iron as inpatient and follow up with hematology as outpatient for IV iron.   I will sign off.  Please call me if any further GI concerns or questions.  We would like to thank you for the  opportunity to participate in the care of Christina French.  Thank you for involving me in the care of this patient.      LOS: 1 day   Christina Bellows, MD  06/14/2021, 8:49 AM

## 2021-06-14 NOTE — Progress Notes (Signed)
PT Cancellation Note  Patient Details Name: Christina French MRN: [id number] DOB: 07/08/1955   Cancelled Treatment:    Reason Eval/Treat Not Completed: Patient not medically ready.  PT consult received.  Chart reviewed.  Pt's last recorded Hgb (this morning at 0356) noted to be 6.0.  Per PT guidelines for low Hgb, will hold PT at this time and re-attempt PT evaluation at a later date/time when medically appropriate.  [name], PT 06/14/21, 7:51 AM

## 2021-06-14 NOTE — Evaluation (Signed)
Occupational Therapy Evaluation Patient Details Name: Christina French MRN: [id number] DOB: November 20, 1955 Today's Date: 06/14/2021   History of Present Illness Pt is a 66 y/o F with PMH: anxiety, arthritis, skin CA, depression, DM, GERD, history of Barrett's, presenting as a direct admission from PCPs office for ongoing rectal bleeding which patient states has been ongoing for several weeks. s/p transfusion upon therapy evaluation with hgb of 7.8.   Clinical Impression   Pt seen for OT evaluation this date in setting of acute hospitalization d/t rectal bleed. She presents this date reporting feeling much better s/p transfusion. She does not require any physical assist for ADLs, but is noted to require slightly increased time with LB ADLs. She is able to complete fxl mobility and ADL transfers with no assist and no AD. Demos good control and balance w/ no sway. Pt left in bed with no alarm set. She appears to be at baseline and this is relayed to RN and PT. No further OT needs detected acutely nor upon d/c at this time.      Recommendations for follow up therapy are one component of a multi-disciplinary discharge planning process, led by the attending physician.  Recommendations may be updated based on patient status, additional functional criteria and insurance authorization.   Follow Up Recommendations  No OT follow up    Assistance Recommended at Discharge    Patient can return home with the following      Functional Status Assessment  Patient has not had a recent decline in their functional status  Equipment Recommendations  None recommended by OT    Recommendations for Other Services       Precautions / Restrictions Restrictions Weight Bearing Restrictions: No      Mobility Bed Mobility Overal bed mobility: Independent                  Transfers Overall transfer level: Independent                        Balance Overall balance assessment: Independent                                          ADL either performed or assessed with clinical judgement   ADL Overall ADL's : Modified independent;At baseline                                       General ADL Comments: increased time for LB ADLs, but no physical assist     Vision Patient Visual Report: No change from baseline       Perception     Praxis      Pertinent Vitals/Pain Pain Assessment Pain Assessment: Faces Faces Pain Scale: Hurts a little bit Pain Location: slight discomfort in rectal area Pain Descriptors / Indicators: Discomfort Pain Intervention(s): Monitored during session     Hand Dominance     Extremity/Trunk Assessment Upper Extremity Assessment Upper Extremity Assessment: Overall WFL for tasks assessed   Lower Extremity Assessment Lower Extremity Assessment: Overall WFL for tasks assessed       Communication Communication Communication: No difficulties   Cognition Arousal/Alertness: Awake/alert Behavior During Therapy: WFL for tasks assessed/performed Overall Cognitive Status: Within Functional Limits for tasks assessed  General Comments       Exercises Other Exercises Other Exercises: ed re: role   Shoulder Instructions      Home Living Family/patient expects to be discharged to:: Private residence Living Arrangements: Alone Available Help at Discharge: Available PRN/intermittently;Family Type of Home: Apartment Home Access: Level entry;Other (comment)     Home Layout: One level               Home Equipment: [place] (2 wheels)          Prior Functioning/Environment Prior Level of Function : Independent/Modified Independent;Driving                        OT Problem List:        OT Treatment/Interventions:      OT Goals(Current goals can be found in the care plan section) Acute Rehab OT Goals Patient Stated  Goal: to go home OT Goal Formulation: All assessment and education complete, DC therapy  OT Frequency:      Co-evaluation              AM-PAC OT "6 Clicks" Daily Activity     Outcome Measure Help from another person eating meals?: None Help from another person taking care of personal grooming?: None Help from another person toileting, which includes using toliet, bedpan, or urinal?: None Help from another person bathing (including washing, rinsing, drying)?: None Help from another person to put on and taking off regular upper body clothing?: None Help from another person to put on and taking off regular lower body clothing?: None 6 Click Score: 24   End of Session Nurse Communication: Mobility status  Activity Tolerance: Patient tolerated treatment well Patient left: in bed;with call bell/phone within reach                   Time: 1035-1046 OT Time Calculation (min): 11 min Charges:  OT General Charges $OT Visit: 1 Visit OT Evaluation $OT Eval Low Complexity: [place], [place], OTR/L ascom [phone] 06/14/21, 11:12 AM

## 2021-06-15 ENCOUNTER — Inpatient Hospital Stay: Payer: Medicare Other | Admitting: Anesthesiology

## 2021-06-15 ENCOUNTER — Other Ambulatory Visit: Payer: Self-pay

## 2021-06-15 ENCOUNTER — Encounter
Admission: AD | Disposition: A | Discharge status: Home / Self Care | Payer: Self-pay | Source: Ambulatory Visit | Attending: Internal Medicine

## 2021-06-15 DIAGNOSIS — K649 Unspecified hemorrhoids: Secondary | ICD-10-CM | POA: Diagnosis not present

## 2021-06-15 HISTORY — PX: HEMORRHOID SURGERY: SHX153

## 2021-06-15 LAB — URINE CULTURE

## 2021-06-15 LAB — CBC
HCT: 27.4 % — ABNORMAL LOW (ref 36.0–46.0)
Hemoglobin: 8.5 g/dL — ABNORMAL LOW (ref 12.0–15.0)
MCH: 23.9 pg — ABNORMAL LOW (ref 26.0–34.0)
MCHC: 31 g/dL (ref 30.0–36.0)
MCV: 77 fL — ABNORMAL LOW (ref 80.0–100.0)
Platelets: 125 10*3/uL — ABNORMAL LOW (ref 150–400)
RBC: 3.56 MIL/uL — ABNORMAL LOW (ref 3.87–5.11)
RDW: 17.6 % — ABNORMAL HIGH (ref 11.5–15.5)
WBC: 5.3 10*3/uL (ref 4.0–10.5)
nRBC: 0 % (ref 0.0–0.2)

## 2021-06-15 LAB — BASIC METABOLIC PANEL
Anion gap: 6 (ref 5–15)
BUN: 6 mg/dL — ABNORMAL LOW (ref 8–23)
CO2: 24 mmol/L (ref 22–32)
Calcium: 8.1 mg/dL — ABNORMAL LOW (ref 8.9–10.3)
Chloride: 105 mmol/L (ref 98–111)
Creatinine, Ser: 0.91 mg/dL (ref 0.44–1.00)
GFR, Estimated: >60 mL/min (ref >60)
Glucose, Bld: 129 mg/dL — ABNORMAL HIGH (ref 70–99)
Potassium: 4.2 mmol/L (ref 3.5–5.1)
Sodium: 135 mmol/L (ref 135–145)

## 2021-06-15 LAB — GLUCOSE, CAPILLARY
Glucose-Capillary: 141 mg/dL — ABNORMAL HIGH (ref 70–99)
Glucose-Capillary: 97 mg/dL (ref 70–99)
Glucose-Capillary: 129 mg/dL — ABNORMAL HIGH (ref 70–99)

## 2021-06-15 LAB — MAGNESIUM: Magnesium: 2 mg/dL (ref 1.7–2.4)

## 2021-06-15 SURGERY — HEMORRHOIDECTOMY
Anesthesia: General | Site: Rectum

## 2021-06-15 MED ORDER — HYDROMORPHONE HCL 1 MG/ML IJ SOLN: 0.5000 mg | INTRAMUSCULAR | Status: DC | PRN

## 2021-06-15 MED ORDER — ONDANSETRON HCL 4 MG/2ML IJ SOLN
INTRAMUSCULAR | Status: AC
  Filled 2021-06-15: qty 2

## 2021-06-15 MED ORDER — ACETAMINOPHEN 10 MG/ML IV SOLN: 1000.0000 mg | Freq: Once | INTRAVENOUS | Status: DC | PRN

## 2021-06-15 MED ORDER — LACTATED RINGERS IV SOLN: INTRAVENOUS | Status: DC | PRN

## 2021-06-15 MED ORDER — DEXTROSE 5 % IV SOLN: INTRAVENOUS | Status: DC | PRN

## 2021-06-15 MED ORDER — BUPIVACAINE LIPOSOME 1.3 % IJ SUSP
INTRAMUSCULAR | Status: AC
  Filled 2021-06-15: qty 20

## 2021-06-15 MED ORDER — BUPIVACAINE-EPINEPHRINE (PF) 0.5% -1:200000 IJ SOLN
INTRAMUSCULAR | Status: AC
  Filled 2021-06-15: qty 30

## 2021-06-15 MED ORDER — FENTANYL CITRATE (PF) 100 MCG/2ML IJ SOLN
INTRAMUSCULAR | Status: AC
  Filled 2021-06-15: qty 2

## 2021-06-15 MED ORDER — DEXMEDETOMIDINE (PRECEDEX) IN NS 20 MCG/5ML (4 MCG/ML) IV SYRINGE
PREFILLED_SYRINGE | INTRAVENOUS | Status: DC | PRN
  Administered 2021-06-15 (×2): 4 ug via INTRAVENOUS

## 2021-06-15 MED ORDER — POLYETHYLENE GLYCOL 3350 17 G PO PACK
17.0000 g | PACK | Freq: Every day | ORAL | Status: DC
  Administered 2021-06-15 – 2021-06-17 (×3): 17 g via ORAL
  Filled 2021-06-15 (×3): qty 1

## 2021-06-15 MED ORDER — FENTANYL CITRATE (PF) 100 MCG/2ML IJ SOLN
INTRAMUSCULAR | Status: DC | PRN
Start: 2021-06-15 — End: 2021-06-15
  Administered 2021-06-15 (×3): 25 ug via INTRAVENOUS
  Administered 2021-06-15: 50 ug via INTRAVENOUS

## 2021-06-15 MED ORDER — DEXAMETHASONE SODIUM PHOSPHATE 10 MG/ML IJ SOLN
INTRAMUSCULAR | Status: AC
  Filled 2021-06-15: qty 1

## 2021-06-15 MED ORDER — SODIUM CHLORIDE 0.9 % IV SOLN
2.0000 g | Freq: Once | INTRAVENOUS | Status: AC
  Administered 2021-06-15: 2 g via INTRAVENOUS

## 2021-06-15 MED ORDER — EPHEDRINE SULFATE (PRESSORS) 50 MG/ML IJ SOLN
INTRAMUSCULAR | Status: DC | PRN
Start: 2021-06-15 — End: 2021-06-15
  Administered 2021-06-15 (×2): 5 mg via INTRAVENOUS

## 2021-06-15 MED ORDER — OXYCODONE HCL 5 MG/5ML PO SOLN: 5.0000 mg | Freq: Once | ORAL | Status: DC | PRN

## 2021-06-15 MED ORDER — ACETAMINOPHEN 10 MG/ML IV SOLN
INTRAVENOUS | Status: DC | PRN
  Administered 2021-06-15: 1000 mg via INTRAVENOUS

## 2021-06-15 MED ORDER — DEXMEDETOMIDINE (PRECEDEX) IN NS 20 MCG/5ML (4 MCG/ML) IV SYRINGE
PREFILLED_SYRINGE | INTRAVENOUS | Status: AC
  Filled 2021-06-15: qty 5

## 2021-06-15 MED ORDER — MIDAZOLAM HCL 2 MG/2ML IJ SOLN
INTRAMUSCULAR | Status: AC
  Filled 2021-06-15: qty 2

## 2021-06-15 MED ORDER — LIDOCAINE HCL (CARDIAC) PF 100 MG/5ML IV SOSY
PREFILLED_SYRINGE | INTRAVENOUS | Status: DC | PRN
Start: 2021-06-15 — End: 2021-06-15
  Administered 2021-06-15: 80 mg via INTRAVENOUS

## 2021-06-15 MED ORDER — CEFOXITIN SODIUM 2 G IV SOLR
INTRAVENOUS | Status: AC
  Filled 2021-06-15: qty 2

## 2021-06-15 MED ORDER — GELATIN ABSORBABLE 12-7 MM EX MISC
CUTANEOUS | Status: AC
  Filled 2021-06-15: qty 2

## 2021-06-15 MED ORDER — OXYCODONE HCL 5 MG PO TABS
5.0000 mg | ORAL_TABLET | ORAL | Status: DC | PRN
  Administered 2021-06-16 – 2021-06-17 (×2): 5 mg via ORAL
  Filled 2021-06-15 (×2): qty 1

## 2021-06-15 MED ORDER — EPHEDRINE 5 MG/ML INJ
INTRAVENOUS | Status: AC
  Filled 2021-06-15: qty 5

## 2021-06-15 MED ORDER — INSULIN ASPART 100 UNIT/ML IJ SOLN
0.0000 [IU] | INTRAMUSCULAR | Status: DC
  Administered 2021-06-15: 1 [IU] via SUBCUTANEOUS
  Filled 2021-06-15: qty 1

## 2021-06-15 MED ORDER — BUPIVACAINE-EPINEPHRINE (PF) 0.5% -1:200000 IJ SOLN
INTRAMUSCULAR | Status: DC | PRN
  Administered 2021-06-15: 50 mL via SURGICAL_CAVITY

## 2021-06-15 MED ORDER — GELATIN ABSORBABLE 100 CM EX MISC
CUTANEOUS | Status: AC
  Filled 2021-06-15: qty 1

## 2021-06-15 MED ORDER — PROPOFOL 10 MG/ML IV BOLUS
INTRAVENOUS | Status: DC | PRN
  Administered 2021-06-15: 120 mg via INTRAVENOUS
  Administered 2021-06-15: 30 mg via INTRAVENOUS

## 2021-06-15 MED ORDER — FENTANYL CITRATE (PF) 100 MCG/2ML IJ SOLN: 25.0000 ug | INTRAMUSCULAR | Status: DC | PRN

## 2021-06-15 MED ORDER — 0.9 % SODIUM CHLORIDE (POUR BTL) OPTIME
TOPICAL | Status: DC | PRN
  Administered 2021-06-15: 600 mL

## 2021-06-15 MED ORDER — PHENYLEPHRINE 40 MCG/ML (10ML) SYRINGE FOR IV PUSH (FOR BLOOD PRESSURE SUPPORT)
PREFILLED_SYRINGE | INTRAVENOUS | Status: DC | PRN
  Administered 2021-06-15 (×3): 40 ug via INTRAVENOUS

## 2021-06-15 MED ORDER — OXYCODONE HCL 5 MG PO TABS: 5.0000 mg | ORAL_TABLET | Freq: Once | ORAL | Status: DC | PRN

## 2021-06-15 MED ORDER — LIDOCAINE HCL (PF) 2 % IJ SOLN
INTRAMUSCULAR | Status: AC
  Filled 2021-06-15: qty 5

## 2021-06-15 MED ORDER — ONDANSETRON HCL 4 MG/2ML IJ SOLN
INTRAMUSCULAR | Status: DC | PRN
  Administered 2021-06-15: 4 mg via INTRAVENOUS

## 2021-06-15 MED ORDER — PROMETHAZINE HCL 25 MG/ML IJ SOLN: 6.2500 mg | INTRAMUSCULAR | Status: DC | PRN

## 2021-06-15 MED ORDER — ACETAMINOPHEN 500 MG PO TABS
1000.0000 mg | ORAL_TABLET | Freq: Four times a day (QID) | ORAL | Status: DC | PRN
  Administered 2021-06-16: 1000 mg via ORAL
  Filled 2021-06-15: qty 2

## 2021-06-15 MED ORDER — MIDAZOLAM HCL 2 MG/2ML IJ SOLN
INTRAMUSCULAR | Status: DC | PRN
  Administered 2021-06-15: 2 mg via INTRAVENOUS

## 2021-06-15 MED ORDER — SODIUM CHLORIDE 0.9 % IV SOLN
INTRAVENOUS | Status: DC
Start: 2021-06-15 — End: 2021-06-16

## 2021-06-15 MED ORDER — GELATIN ABSORBABLE 100 EX MISC
CUTANEOUS | Status: DC | PRN
  Administered 2021-06-15: 1

## 2021-06-15 MED ORDER — ACETAMINOPHEN 10 MG/ML IV SOLN
INTRAVENOUS | Status: AC
  Filled 2021-06-15: qty 100

## 2021-06-15 SURGICAL SUPPLY — 36 items
BRIEF STRETCH FOR OB PAD XXL (UNDERPADS AND DIAPERS) ×3 IMPLANT
DRAPE PERI LITHO V/GYN (MISCELLANEOUS) ×3 IMPLANT
DRAPE UNDER BUTTOCK W/FLU (DRAPES) ×3 IMPLANT
DRSG GAUZE FLUFF 36X18 (GAUZE/BANDAGES/DRESSINGS) ×3 IMPLANT
ELECT CAUTERY BLADE TIP 2.5 (TIP) ×2
ELECT REM PT RETURN 9FT ADLT (ELECTROSURGICAL) ×2
ELECTRODE CAUTERY BLDE TIP 2.5 (TIP) ×2 IMPLANT
ELECTRODE REM PT RTRN 9FT ADLT (ELECTROSURGICAL) ×2 IMPLANT
GAUZE 4X4 16PLY ~~LOC~~+RFID DBL (SPONGE) ×3 IMPLANT
GLOVE SURG SYN 7.0 (GLOVE) ×2 IMPLANT
GLOVE SURG SYN 7.0 PF PI (GLOVE) ×2 IMPLANT
GLOVE SURG SYN 7.5  E (GLOVE) ×2
GLOVE SURG SYN 7.5 E (GLOVE) ×1 IMPLANT
GLOVE SURG SYN 7.5 PF PI (GLOVE) ×2 IMPLANT
GOWN STRL REUS W/ TWL LRG LVL3 (GOWN DISPOSABLE) ×4 IMPLANT
GOWN STRL REUS W/TWL LRG LVL3 (GOWN DISPOSABLE) ×4
HANDLE YANKAUER SUCT BULB TIP (MISCELLANEOUS) ×1 IMPLANT
KIT TURNOVER KIT A (KITS) ×3 IMPLANT
LABEL OR SOLS (LABEL) ×3 IMPLANT
MANIFOLD NEPTUNE II (INSTRUMENTS) ×3 IMPLANT
NEEDLE HYPO 22GX1.5 SAFETY (NEEDLE) ×3 IMPLANT
NS IRRIG 500ML POUR BTL (IV SOLUTION) ×4 IMPLANT
PACK BASIN MINOR ARMC (MISCELLANEOUS) ×3 IMPLANT
PAD OB MATERNITY 4.3X12.25 (PERSONAL CARE ITEMS) ×3 IMPLANT
PAD PREP 24X41 OB/GYN DISP (PERSONAL CARE ITEMS) ×3 IMPLANT
SHEARS HARMONIC 9CM CVD (BLADE) ×4 IMPLANT
SOL PREP PVP 2OZ (MISCELLANEOUS) ×2
SOLUTION PREP PVP 2OZ (MISCELLANEOUS) ×2 IMPLANT
SPONGE SURGIFOAM ABS GEL 12-7 (HEMOSTASIS) ×2 IMPLANT
SURGILUBE 2OZ TUBE FLIPTOP (MISCELLANEOUS) ×3 IMPLANT
SUT VIC AB 2-0 SH 27 (SUTURE) ×4
SUT VIC AB 2-0 SH 27XBRD (SUTURE) ×4 IMPLANT
SYR 10ML LL (SYRINGE) ×3 IMPLANT
SYR BULB IRRIG 60ML STRL (SYRINGE) ×3 IMPLANT
TAPE TRANSPORE STRL 2 31045 (GAUZE/BANDAGES/DRESSINGS) ×1 IMPLANT
WATER STERILE IRR 500ML POUR (IV SOLUTION) ×3 IMPLANT

## 2021-06-15 NOTE — Anesthesia Postprocedure Evaluation (Signed)
Anesthesia Post Note  Patient: Christina French  Procedure(s) Performed: HEMORRHOIDECTOMY (Rectum)  Patient location during evaluation: PACU Anesthesia Type: General Level of consciousness: awake and alert Pain management: pain level controlled Vital Signs Assessment: post-procedure vital signs reviewed and stable Respiratory status: spontaneous breathing, nonlabored ventilation and respiratory function stable Cardiovascular status: blood pressure returned to baseline and stable Postop Assessment: no apparent nausea or vomiting Anesthetic complications: no   No notable events documented.   Last Vitals:  Vitals:   06/15/21 1556 06/15/21 1942  BP: 119/79 (!) 132/94  Pulse: 78 80  Resp: 17 20  Temp: 36.7 C 36.4 C  SpO2: 100% 96%    Last Pain:  Vitals:   06/15/21 1556  TempSrc: Oral  PainSc:                  [place] Ouch

## 2021-06-15 NOTE — Progress Notes (Signed)
PT Cancellation Note  Patient Details Name: JAYLN BRANSCOM MRN: [id number] DOB: [birth year]   Cancelled Treatment:    Reason Eval/Treat Not Completed: PT screened, no needs identified, will sign off.  PT consult received.  Chart reviewed.  Per discussion with OT yesterday and nursing today, pt is ambulating independently in room and no PT needs noted.  Therapist stopped to talk with pt this morning and pt reporting she is walking without any issues and has no PT needs (pt declined any OOB mobility d/t going to procedure soon).  PT screen performed; no needs identified; will sign off at this time.  [name], PT 06/15/21, 8:56 AM

## 2021-06-15 NOTE — Assessment & Plan Note (Signed)
-   See GI bleed above.

## 2021-06-15 NOTE — Progress Notes (Signed)
PROGRESS NOTE    Christina French  GNF:[id number] DOB: May 30, 1955 DOA: 06/13/2021 PCP: [name], MD    No chief complaint on file.   Brief Narrative:  Patient is a pleasant 66 year old female history of hemorrhoids, diabetes, hypertension, seizure disorder, history of back presenting as a direct admission from PCPs office with ongoing rectal bleeding times several weeks, symptomatic anemia, hemoglobin noted at 5.4 from 7.4 (05/27/2021).  Patient admitted, transfused 3 units packed red blood cells.  GI consulted recommended hemorrhoidal banding versus surgical hemorrhoidectomy however patient adamant for surgical hemorrhoidectomy and as such general surgery consulted.  Patient scheduled for hemorrhoidectomy 06/15/2021.    Assessment & Plan:  Principal Problem:   GI bleed Active Problems:   Rectal bleeding   Symptomatic anemia   Chronic hyponatremia   Seizure disorder (HCC)   Essential hypertension   Anxiety disorder   Dehydration   Hyponatremia   Severe anemia   Uncontrolled type 2 diabetes mellitus with hyperglycemia (HCC)   Hyperlipidemia associated with type 2 diabetes mellitus (HCC)   Major depressive disorder, recurrent, in remission ([place])   Hypomagnesemia   Bleeding internal hemorrhoids    Assessment and Plan: * GI bleed- (present on admission) - Patient presented as a direct admission from PCPs office with several weeks of bright red blood per rectum with every bowel movement. -Patient feels likely hemorrhoidal in nature as she states has hemorrhoids the size of grapefruit. -Patient has been worked up for GI bleed in the past with a negative capsule endoscopy (01/06/2019), upper endoscopy done 12/21/2018 with Barrett's stage C 4 otherwise within normal limits.  Colonoscopy done 12/21/2020 with nonbleeding internal hemorrhoids noted. -Patient presenting from PCPs office with a hemoglobin of 5.4 from 7.4(05/27/2021).  Hemoglobin at 12.6 (01/30/2021). -Patient with complaints  of generalized fatigue, shortness of breath, noted to have pallor on admission which have improved posttransfusion.. -Patient vitals within normal limits and patient not tachycardic on admission. -Anemia panel consistent with severe iron deficiency anemia with iron level of 16, ferritin of 3. -Patient transfused 3 units packed red blood cells with hemoglobin currently at 8.5. -Patient still with ongoing bloody bowel movements and as such we will repeat H&H postoperatively. -Continue IV PPI. -Patient seen in consultation by GI, who noted patient has failed conservative management and recommended options of hemorrhoidal banding versus surgical hemorrhoidectomy. -Per GI patient noted to have refused banding at the office and wanted surgical hemorrhoidectomy. -GI recommending sitz bath, Anusol, high-fiber diet with the aim of 25 g of fiber per day. -GI recommending surgical consultation. -Patient seen in consultation by general surgery who has assessed the patient and patient adamant on hemorrhoidectomy and as such patient will be scheduled for hemorrhoidectomy today, 06/15/2021. -Will need IV iron during this hospitalization. -Continue serial H&H. -GI has signed off. -General surgery following and appreciate input and recommendations.   Symptomatic anemia- (present on admission) - Patient presented with generalized fatigue, lightheadedness, shortness of breath, pallor. -Patient with complaints of ongoing rectal bleeding feels likely from hemorrhoids. -Hemoglobin of 5.4 on presentation, from 7.4(05/27/2021).  Hemoglobin noted at 12.6 (01/30/2021). -Status post transfusion 3 units packed red blood cells with hemoglobin at 8.5. -Patient still with bloody bowel movements -Serial H&H. -Anemia panel consistent with severe iron deficiency anemia. -Will need IV iron during this hospitalization. -See above GI bleed.  Rectal bleeding- (present on admission) - See above GI bleed/symptomatic  anemia.  Bleeding internal hemorrhoids - See GI bleed above.  Hypomagnesemia- (present on admission) - Magnesium at  1.2 on presentation.. -Phosphorus at 4.2. -Magnesium sulfate 4 g IV x1 given 06/14/2021 with magnesium currently at 2.0.. -Repeat labs in the morning.  Major depressive disorder, recurrent, in remission ([place])- (present on admission) - Continue home regimen Lexapro.  Hyperlipidemia associated with type 2 diabetes mellitus ([place])- (present on admission) - Continue Crestor.    Uncontrolled type 2 diabetes mellitus with hyperglycemia ([place])- (present on admission) - Well-controlled type 2 diabetes mellitus -Hemoglobin A1c 5.2 (06/13/2021). -Continue to hold oral hypoglycemic agents. -SSI every 4 hours as patient currently NPO.  Severe anemia- (present on admission) - See symptomatic anemia.  Hyponatremia- (present on admission) - Patient with a chronic hyponatremia -Currently stable.  Follow-up.  Dehydration- (present on admission) - IV fluids. -Status post transfusion 3 units packed red blood cells.    Anxiety disorder- (present on admission) - Continue home regimen Lexapro, Xanax as needed.  Essential hypertension- (present on admission) - Blood pressure borderline on 06/14/2021.   -Atenolol discontinued.   -BP improved.   -Continue propranolol.    Seizure disorder [facility]) - Patient with no seizures noted. -Continue home regimen of antiseizure medications once med rec is completed.  Chronic hyponatremia- (present on admission) - Stable.         DVT prophylaxis: SCDs Code Status: Full Family Communication: Updated patient.  No family at bedside. Disposition: Likely home once clinically improved.  Status is: Inpatient Remains inpatient appropriate because: Severity of illness.           Consultants:  Gastroenterology: Dr. Vicente Males 06/14/2021 General surgery: Dr. Hampton Abbot 06/14/2021  Procedures:  Hemorrhoidectomy pending Transfused 3 units  packed red blood cells.  Antimicrobials:  None   Subjective: Patient on gurney in the preop area awaiting hemorrhoidectomy.  Denies any chest pain.  No shortness of breath.  Denies any significant abdominal pain.  States had a bloody bowel movement this morning.  Appreciative of the care she is receiving.    Objective: Vitals:   06/14/21 2143 06/15/21 0433 06/15/21 0445 06/15/21 0747  BP: 113/74 101/73  (!) 144/86  Pulse: 68 86  78  Resp:  16  18  Temp:  97.7 F (36.5 C)  97.6 F (36.4 C)  TempSrc:  Oral  Oral  SpO2:  99%  100%  Weight:   82 kg   Height:        Intake/Output Summary (Last 24 hours) at 06/15/2021 1004 Last data filed at 06/15/2021 0516 Gross per 24 hour  Intake 2077.63 ml  Output --  Net 2077.63 ml   Filed Weights   06/14/21 0536 06/14/21 0629 06/15/21 0445  Weight: 82.9 kg 81 kg 82 kg    Examination:  General exam: NAD. Respiratory system: Clear to auscultation bilaterally, no wheezes, no crackles, no rhonchi.  Normal respiratory effort.. Cardiovascular system: Regular rate rhythm no murmurs rubs or gallops.  No JVD.  No lower extremity edema.  Gastrointestinal system: Abdomen is soft, nondistended, decreased tenderness to palpation diffusely, positive bowel sounds.  No rebound.  No guarding.  Central nervous system: Alert and oriented. No focal neurological deficits. Extremities: Symmetric 5 x 5 power. Skin: No rashes, lesions or ulcers Psychiatry: Judgement and insight appear normal. Mood & affect appropriate.     Data Reviewed:   CBC: Recent Labs  Lab 06/13/21 1234 06/14/21 0356 06/14/21 0947 06/14/21 1634 06/15/21 0641  WBC 6.8 4.6  --   --  5.3  NEUTROABS 5.1  --   --   --   --   HGB 5.4*  6.0* 7.8* 8.6* 8.5*  HCT 19.5* 20.2* 25.5* 27.2* 27.4*  MCV 71.2* 73.2*  --   --  77.0*  PLT 164 129*  --   --  125*    Basic Metabolic Panel: Recent Labs  Lab 06/13/21 1234 06/14/21 0356 06/15/21 0641  NA 131* 132* 135  K 4.0 3.7 4.2  CL  97* 102 105  CO2 25 24 24   GLUCOSE 161* 94 129*  BUN 10 8 6*  CREATININE 0.93 0.87 0.91  CALCIUM 9.6 8.5* 8.1*  MG  --  1.2* 2.0  PHOS  --  4.2  --     GFR: Estimated Creatinine Clearance: 60.6 mL/min (by C-G formula based on SCr of 0.91 mg/dL).  Liver Function Tests: Recent Labs  Lab 06/14/21 0356  AST 10*  ALT 8  ALKPHOS 36*  BILITOT 1.1  PROT 5.2*  ALBUMIN 3.1*    CBG: Recent Labs  Lab 06/13/21 2358  GLUCAP 96     Recent Results (from the past 240 hour(s))  Resp Panel by RT-PCR (Flu A&B, Covid) Nasopharyngeal Swab     Status: None   Collection Time: 06/13/21  6:30 PM   Specimen: Nasopharyngeal Swab; Nasopharyngeal(NP) swabs in vial transport medium  Result Value Ref Range Status   SARS Coronavirus 2 by RT PCR NEGATIVE NEGATIVE Final    Comment: (NOTE) SARS-CoV-2 target nucleic acids are NOT DETECTED.  The SARS-CoV-2 RNA is generally detectable in upper respiratory specimens during the acute phase of infection. The lowest concentration of SARS-CoV-2 viral copies this assay can detect is 138 copies/mL. A negative result does not preclude SARS-Cov-2 infection and should not be used as the sole basis for treatment or other patient management decisions. A negative result may occur with  improper specimen collection/handling, submission of specimen other than nasopharyngeal swab, presence of viral mutation(s) within the areas targeted by this assay, and inadequate number of viral copies(<138 copies/mL). A negative result must be combined with clinical observations, patient history, and epidemiological information. The expected result is Negative.  Fact Sheet for Patients:  EntrepreneurPulse.com.au  Fact Sheet for Healthcare Providers:  IncredibleEmployment.be  This test is no t yet approved or cleared by the [place] FDA and  has been authorized for detection and/or diagnosis of SARS-CoV-2 by FDA under an Emergency  Use Authorization (EUA). This EUA will remain  in effect (meaning this test can be used) for the duration of the COVID-19 declaration under Section 564(b)(1) of the Act, 21 U.S.C.section 360bbb-3(b)(1), unless the authorization is terminated  or revoked sooner.       Influenza A by PCR NEGATIVE NEGATIVE Final   Influenza B by PCR NEGATIVE NEGATIVE Final    Comment: (NOTE) The Xpert Xpress SARS-CoV-2/FLU/RSV plus assay is intended as an aid in the diagnosis of influenza from Nasopharyngeal swab specimens and should not be used as a sole basis for treatment. Nasal washings and aspirates are unacceptable for Xpert Xpress SARS-CoV-2/FLU/RSV testing.  Fact Sheet for Patients: EntrepreneurPulse.com.au  Fact Sheet for Healthcare Providers: IncredibleEmployment.be  This test is not yet approved or cleared by the [place] FDA and has been authorized for detection and/or diagnosis of SARS-CoV-2 by FDA under an Emergency Use Authorization (EUA). This EUA will remain in effect (meaning this test can be used) for the duration of the COVID-19 declaration under Section 564(b)(1) of the Act, 21 U.S.C. section 360bbb-3(b)(1), unless the authorization is terminated or revoked.  Performed at [facility], [address] [place], [place] [address]  Urine Culture     Status: Abnormal   Collection Time: 06/13/21  7:30 PM   Specimen: Urine, Random  Result Value Ref Range Status   Specimen Description   Final    URINE, RANDOM Performed at [facility], [address] [place], [place] [address]    Special Requests   Final    NONE Performed at [facility], [address] [place], [place] [address]    Culture MULTIPLE SPECIES PRESENT, SUGGEST RECOLLECTION (A)  Final   Report Status 06/15/2021 FINAL  Final         Radiology Studies: No results found.      Scheduled Meds:  escitalopram  20 mg Oral Daily    ferrous sulfate  325 mg Oral BID   gabapentin  100 mg Oral BID   hydrocortisone   Rectal TID   insulin aspart  0-9 Units Subcutaneous Q4H   lacosamide  150 mg Oral BID   levETIRAcetam  500 mg Oral BID   montelukast  10 mg Oral QHS   pantoprazole (PROTONIX) IV  40 mg Intravenous Q12H   propranolol  10 mg Oral TID   rosuvastatin  5 mg Oral Daily   sodium chloride flush  3 mL Intravenous Q12H   traZODone  150 mg Oral QHS   Continuous Infusions:  sodium chloride 100 mL/hr at 06/15/21 0918     LOS: 2 days    Time spent: 40 minutes    Irine Seal, MD [facility]   To contact the attending provider between 7A-7P or the covering provider during after hours 7P-7A, please log into the web site [email or web address] and access using universal [place] password for that web site. If you do not have the password, please call the hospital operator.  06/15/2021, 10:04 AM

## 2021-06-15 NOTE — Progress Notes (Signed)
06/15/2021  Subjective: Patient ports she had more bowel movements with blood in them which was bright red in nature.  Her hemoglobin this morning is stable at 8.5 and was 8.6 yesterday after the last PRBC transfusion.  Denies any abdominal pain.  Vital signs: Temp:  [97.4 F (36.3 C)-98.2 F (36.8 C)] 98 F (36.7 C) (02/19 1556) Pulse Rate:  [68-86] 78 (02/19 1556) Resp:  [12-20] 17 (02/19 1556) BP: (95-144)/(57-86) 119/79 (02/19 1556) SpO2:  [92 %-100 %] 100 % (02/19 1556) Weight:  [82 kg] 82 kg (02/19 0445)   Intake/Output: 02/18 0701 - 02/19 0700 In: 2866.6 [P.O.:480; I.V.:1747.6; Blood:639] Out: -  Last BM Date : 06/15/21  Physical Exam: Constitutional: No acute distress. Rectal: No gross bleeding at this point but the patient does have inflamed and friable internal hemorrhoids.  Further rectal exam deferred today.  Labs:  Recent Labs    06/14/21 0356 06/14/21 0947 06/14/21 1634 06/15/21 0641  WBC 4.6  --   --  5.3  HGB 6.0*   < > 8.6* 8.5*  HCT 20.2*   < > 27.2* 27.4*  PLT 129*  --   --  125*   < > = values in this interval not displayed.   Recent Labs    06/14/21 0356 06/15/21 0641  NA 132* 135  K 3.7 4.2  CL 102 105  CO2 24 24  GLUCOSE 94 129*  BUN 8 6*  CREATININE 0.87 0.91  CALCIUM 8.5* 8.1*   Recent Labs    06/14/21 0356  LABPROT 14.4  INR 1.1    Imaging: No results found.  Assessment/Plan: This is a 66 y.o. female with bleeding internal hemorrhoids.  - Patient scheduled for surgery this morning for Examiner anesthesia and hemorrhoidectomy.  Reviewed the surgery again with the patient and all of her questions have been answered.  She is ready for surgery today. -Keep n.p.o. for now and will be able to resume diet after surgery.  Continue holding any NSAIDs, aspirin, or DVT type of products for now and after surgery. -Anticipate should be able to be discharged tomorrow as long as her hemoglobin is stable.  I spent 25 minutes dedicated to  the care of this patient on the date of this encounter to include pre-visit review of records, face-to-face time with the patient discussing diagnosis and management, and any post-visit coordination of care.  Melvyn Neth, [facility]

## 2021-06-15 NOTE — Op Note (Signed)
Procedure Date:  06/15/2021  Pre-operative Diagnosis:  Bleeding internal hemorrhoids  Post-operative Diagnosis:  Bleeding internal hemorrhoids  Procedure:  Exam under Anesthesia, Hemorrhoidectomy of three columns  Surgeon:  Melvyn Neth, MD  Anesthesia:  General endotracheal  Estimated Blood Loss:  30 ml  Specimens: Right anterior column Right posterior column Left lateral column  Complications:  None  Indications for Procedure:  This is a 66 y.o. female with a long history of bleeding internal hemorrhoids, admitted with a low hemoglobin due to chronic bleeding.  She's here now for hemorrhoidectomy.  The risks of bleeding, infection, bowel injury, and need for further procedures were all discussed with the patient and she was willing to proceed.   Description of Procedure: The patient was correctly identified in the preoperative area and brought into the operating room.  The patient was placed supine with VTE prophylaxis in place.  Appropriate time-outs were performed.  Anesthesia was induced and the patient was intubated.  Appropriate antibiotics were infused.  The patient was then placed in high lithotomy position.   The perianal area was prepped and draped in usual sterile fashion.  The sphincter was digitally dilated.  Then the anoscope was inserted and the anal canal was evaluated, revealing very enlarged and friable internal hemorrhoids of all three columns.  The bivalve retractor was then inserted, and we started with the right posterior column.  Cautery was used to start the dissection, and the  hemorrhoid was taken using the Harmonic device.  We then continued with the left lateral component in the same fashion with cautery and Harmonic.  Finally, we continued with the right anterior column, which was the smallest of the three.  Careful attention during each section to make sure the sphincter muscle was not included in the resection and was preserved intact.  Any bleeding was  controlled with the Harmonic and/or with cautery.  The anal canal was then irrigated.  50 ml of Exparel solution was infiltrated into the perianal area and as bilateral pudendal blocks.  A large gelfoam gauze was rolled and inserted into the anal canal for further hemostasis.  The perianal area was cleaned and dressed with fluffed gauze and mesh underwear.  The patient was emerged from anesthesia and extubated and brought to the recovery room for further management.  The patient tolerated the procedure well and all counts were correct at the end of the case.   Melvyn Neth, MD

## 2021-06-15 NOTE — Anesthesia Procedure Notes (Signed)
Procedure Name: LMA Insertion Date/Time: 06/15/2021 10:12 AM Performed by: Garner Nash, CRNA Pre-anesthesia Checklist: Patient identified, Emergency Drugs available, Suction available, Patient being monitored and Timeout performed Patient Re-evaluated:Patient Re-evaluated prior to induction Oxygen Delivery Method: Circle system utilized Preoxygenation: Pre-oxygenation with 100% oxygen Induction Type: IV induction LMA: LMA inserted LMA Size: 3.0 Number of attempts: 1

## 2021-06-15 NOTE — Transfer of Care (Signed)
Immediate Anesthesia Transfer of Care Note  Patient: Christina French  Procedure(s) Performed: HEMORRHOIDECTOMY (Rectum)  Patient Location: PACU  Anesthesia Type:General  Level of Consciousness: sedated  Airway & Oxygen Therapy: Patient connected to face mask oxygen  Post-op Assessment: Report given to RN  Post vital signs: stable  Last Vitals:  Vitals Value Taken Time  BP 102/62 06/15/21 1147  Temp 97.8   Pulse 68 06/15/21 1153  Resp 11 06/15/21 1153  SpO2 100 % 06/15/21 1153  Vitals shown include unvalidated device data.  Last Pain:  Vitals:   06/15/21 0747  TempSrc: Oral  PainSc:          Complications: No notable events documented.

## 2021-06-16 ENCOUNTER — Encounter: Payer: Self-pay | Admitting: Surgery

## 2021-06-16 LAB — TYPE AND SCREEN
ABO/RH(D): O NEG
Antibody Screen: POSITIVE
Donor AG Type: NEGATIVE
Donor AG Type: NEGATIVE
Donor AG Type: NEGATIVE
Unit division: 0
Unit division: 0
Unit division: 0
Unit division: 0
Unit division: 0
Unit division: 0
Unit division: 0
Unit division: 0

## 2021-06-16 LAB — PREPARE RBC (CROSSMATCH)

## 2021-06-16 LAB — BPAM RBC
Blood Product Expiration Date: 202302282359
Blood Product Expiration Date: 202303082359
Blood Product Expiration Date: 202303152359
Blood Product Expiration Date: 202303152359
Blood Product Expiration Date: 202303152359
Blood Product Expiration Date: 202303272359
Blood Product Expiration Date: 202303302359
Blood Product Expiration Date: 202303302359
ISSUE DATE / TIME: 202302172332
ISSUE DATE / TIME: 202302180528
ISSUE DATE / TIME: 202302181125
Unit Type and Rh: 9500
Unit Type and Rh: 9500
Unit Type and Rh: 9500
Unit Type and Rh: 9500
Unit Type and Rh: 9500
Unit Type and Rh: 9500
Unit Type and Rh: 9500
Unit Type and Rh: 9500

## 2021-06-16 LAB — GLUCOSE, CAPILLARY
Glucose-Capillary: 105 mg/dL — ABNORMAL HIGH (ref 70–99)
Glucose-Capillary: 105 mg/dL — ABNORMAL HIGH (ref 70–99)
Glucose-Capillary: 117 mg/dL — ABNORMAL HIGH (ref 70–99)
Glucose-Capillary: 118 mg/dL — ABNORMAL HIGH (ref 70–99)
Glucose-Capillary: 123 mg/dL — ABNORMAL HIGH (ref 70–99)
Glucose-Capillary: 152 mg/dL — ABNORMAL HIGH (ref 70–99)
Glucose-Capillary: 163 mg/dL — ABNORMAL HIGH (ref 70–99)
Glucose-Capillary: 177 mg/dL — ABNORMAL HIGH (ref 70–99)
Glucose-Capillary: 69 mg/dL — ABNORMAL LOW (ref 70–99)
Glucose-Capillary: 72 mg/dL (ref 70–99)
Glucose-Capillary: 83 mg/dL (ref 70–99)

## 2021-06-16 LAB — CBC WITH DIFFERENTIAL/PLATELET
Abs Immature Granulocytes: 0.02 10*3/uL (ref 0.00–0.07)
Basophils Absolute: 0.1 10*3/uL (ref 0.0–0.1)
Basophils Relative: 1 %
Eosinophils Absolute: 0.2 10*3/uL (ref 0.0–0.5)
Eosinophils Relative: 2 %
HCT: 28.6 % — ABNORMAL LOW (ref 36.0–46.0)
Hemoglobin: 8.8 g/dL — ABNORMAL LOW (ref 12.0–15.0)
Immature Granulocytes: 0 %
Lymphocytes Relative: 9 %
Lymphs Abs: 0.7 10*3/uL (ref 0.7–4.0)
MCH: 24.1 pg — ABNORMAL LOW (ref 26.0–34.0)
MCHC: 30.8 g/dL (ref 30.0–36.0)
MCV: 78.4 fL — ABNORMAL LOW (ref 80.0–100.0)
Monocytes Absolute: 0.8 10*3/uL (ref 0.1–1.0)
Monocytes Relative: 9 %
Neutro Abs: 6.8 10*3/uL (ref 1.7–7.7)
Neutrophils Relative %: 79 %
Platelets: 150 10*3/uL (ref 150–400)
RBC: 3.65 MIL/uL — ABNORMAL LOW (ref 3.87–5.11)
RDW: 18.3 % — ABNORMAL HIGH (ref 11.5–15.5)
WBC: 8.6 10*3/uL (ref 4.0–10.5)
nRBC: 0 % (ref 0.0–0.2)

## 2021-06-16 LAB — BASIC METABOLIC PANEL
Anion gap: 5 (ref 5–15)
BUN: <5 mg/dL — ABNORMAL LOW (ref 8–23)
CO2: 27 mmol/L (ref 22–32)
Calcium: 8.2 mg/dL — ABNORMAL LOW (ref 8.9–10.3)
Chloride: 103 mmol/L (ref 98–111)
Creatinine, Ser: 0.83 mg/dL (ref 0.44–1.00)
GFR, Estimated: >60 mL/min (ref >60)
Glucose, Bld: 129 mg/dL — ABNORMAL HIGH (ref 70–99)
Potassium: 4.1 mmol/L (ref 3.5–5.1)
Sodium: 135 mmol/L (ref 135–145)

## 2021-06-16 LAB — MAGNESIUM: Magnesium: 1.8 mg/dL (ref 1.7–2.4)

## 2021-06-16 MED ORDER — PANTOPRAZOLE SODIUM 40 MG PO TBEC
40.0000 mg | DELAYED_RELEASE_TABLET | Freq: Every day | ORAL | Status: DC
Start: 2021-06-17 — End: 2021-06-17
  Administered 2021-06-17: 40 mg via ORAL
  Filled 2021-06-16: qty 1

## 2021-06-16 MED ORDER — IBUPROFEN 600 MG PO TABS: 600.0000 mg | ORAL_TABLET | Freq: Three times a day (TID) | ORAL | 0 refills | Status: DC | PRN

## 2021-06-16 MED ORDER — SODIUM CHLORIDE 0.9 % IV SOLN
510.0000 mg | Freq: Once | INTRAVENOUS | Status: AC
  Administered 2021-06-16: 510 mg via INTRAVENOUS
  Filled 2021-06-16: qty 17

## 2021-06-16 MED ORDER — ACETAMINOPHEN 500 MG PO TABS: 1000.0000 mg | ORAL_TABLET | Freq: Four times a day (QID) | ORAL | 0 refills | Status: DC | PRN

## 2021-06-16 MED ORDER — OXYCODONE HCL 5 MG PO TABS: 5.0000 mg | ORAL_TABLET | Freq: Four times a day (QID) | ORAL | 0 refills | Status: DC | PRN

## 2021-06-16 MED ORDER — INSULIN ASPART 100 UNIT/ML IJ SOLN
0.0000 [IU] | Freq: Three times a day (TID) | INTRAMUSCULAR | Status: DC
  Administered 2021-06-16: 2 [IU] via SUBCUTANEOUS
  Administered 2021-06-17: 1 [IU] via SUBCUTANEOUS
  Filled 2021-06-16 (×2): qty 1

## 2021-06-16 MED ORDER — MAGNESIUM SULFATE 2 GM/50ML IV SOLN
2.0000 g | Freq: Once | INTRAVENOUS | Status: AC
  Administered 2021-06-16: 2 g via INTRAVENOUS
  Filled 2021-06-16: qty 50

## 2021-06-16 MED ORDER — FUROSEMIDE 10 MG/ML IJ SOLN: 20.0000 mg | Freq: Once | INTRAMUSCULAR | Status: DC

## 2021-06-16 NOTE — TOC Initial Note (Signed)
Transition of Care [facility]) - Initial/Assessment Note    Patient Details  Name: Christina French MRN: [id number] Date of Birth: [birth year]  Transition of Care [facility]) CM/SW Contact:    Beverly Sessions, RN Phone Number: 06/16/2021, 9:31 AM  Clinical Narrative:                  Transition of Care [facility]) Screening Note   Patient Details  Name: Christina French Date of Birth: [birth year]   Transition of Care [facility]) CM/SW Contact:    Beverly Sessions, RN Phone Number: 06/16/2021, 9:31 AM    Transition of Care Department [facility]) has reviewed patient and no TOC needs have been identified at this time. We will continue to monitor patient advancement through interdisciplinary progression rounds. If new patient transition needs arise, please place a TOC consult.          Patient Goals and CMS Choice        Expected Discharge Plan and Services                                                Prior Living Arrangements/Services                       Activities of Daily Living Home Assistive Devices/Equipment: Eyeglasses ADL Screening (condition at time of admission) Patient's cognitive ability adequate to safely complete daily activities?: Yes Is the patient deaf or have difficulty hearing?: No Does the patient have difficulty seeing, even when wearing glasses/contacts?: No Does the patient have difficulty concentrating, remembering, or making decisions?: No Patient able to express need for assistance with ADLs?: No Does the patient have difficulty dressing or bathing?: No Independently performs ADLs?: Yes (appropriate for developmental age) Does the patient have difficulty walking or climbing stairs?: Yes Weakness of Legs: Both Weakness of Arms/Hands: Both  Permission Sought/Granted                  Emotional Assessment              Admission diagnosis:  GI bleed [K92.2] Patient Active Problem List   Diagnosis Date Noted   Hypomagnesemia  06/14/2021   Bleeding internal hemorrhoids    Major depressive disorder, recurrent, in remission ([place]) 10/23/2020   Vertigo 10/12/2020   Atrial fibrillation, unspecified type ([place]) 10/11/2020   Hyperlipidemia associated with type 2 diabetes mellitus ([place]) 02/02/2020   Uncontrolled type 2 diabetes mellitus with hyperglycemia ([place]) 01/31/2020   Encounter for general adult medical examination with abnormal findings 06/16/2019   Neuralgia and neuritis, unspecified 06/16/2019   Central sleep apnea due to medical condition 02/27/2019   Hematochezia 12/20/2018   Impaired fasting glucose 11/11/2017   Low back pain of thoracolumbar region with sciatica 11/11/2017   Severe anemia    GI bleed 04/25/2017   Symptomatic anemia 04/25/2017   Hyponatremia 04/25/2017   Diabetes ([place]) 04/25/2017   Rectal bleeding 08/03/2016   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   Anemia 04/16/2015   Anxiety disorder 03/20/2015   Acute hyperglycemia 03/20/2015   Pulse irregularity 03/20/2015   Dehydration 03/20/2015   Prosthetic hip infection ([place])    Staphylococcus aureus infection    Enteritis due to Clostridium difficile    Screening for breast cancer    Acute pain of right hip; hip infection 11/24/2014   Right hip  pain 11/24/2014   Chronic hyponatremia 11/24/2014   Seizure disorder ([place]) 08/17/2013   Digestive disorder 08/17/2013   Essential hypertension 08/17/2013   Hyperlipidemia 08/17/2013   Seizure ([place]) 08/17/2013   PCP:  [name], MD Pharmacy:   [facility] [address] [place] [address] Phone: [phone] Fax: [phone]     Social Determinants of Health (SDOH) Interventions    Readmission Risk Interventions No flowsheet data found.

## 2021-06-16 NOTE — Discharge Instructions (Addendum)
Discharge Instructions: --You have a dissolvable gauze in the anal canal.  You may pass this when you pass gas or have a bowel movement.  Once it's out, ok to discard and do not reinsert. --Start taking fiber supplement (either Benefiber or Metamucil) once daily to help make your stool softer/less hard --Continue taking Miralax once daily to help with any constipation.  If having diarrhea, can space out the dosing to every other day. --Do Sitz baths twice daily and/or after bowel movements to help soothe the raw perianal tissues --Do not insert anything per rectum, such as enemas. --Some oozing is normal after this kind of surgery.  However, if there is more profuse bleeding, please contact us for further evaluation or come to the hospital. --OK to resume your Aspirin on 06/17/21.

## 2021-06-16 NOTE — Care Management Important Message (Signed)
Important Message  Patient Details  Name: Christina French MRN: [id number] Date of Birth: Sep 18, 1955   Medicare Important Message Given:  Yes     Dannette Barbara 06/16/2021, 10:29 AM

## 2021-06-16 NOTE — Progress Notes (Signed)
06/16/2021  Subjective: Patient is 1 Day Post-Op s/p exam under anesthesia and hemorrhoidectomy of three columns.  No acute events.  Patient reports some mild oozing but not gross bleeding like before.  Denies any significant pain.  Vital signs: Temp:  [97.4 F (36.3 C)-98.2 F (36.8 C)] 98.2 F (36.8 C) (02/20 0406) Pulse Rate:  [72-80] 79 (02/20 0406) Resp:  [12-20] 16 (02/20 0406) BP: (102-132)/(62-94) 123/78 (02/20 0406) SpO2:  [92 %-100 %] 100 % (02/20 0406) Weight:  [85.2 kg] 85.2 kg (02/20 0500)   Intake/Output: 02/19 0701 - 02/20 0700 In: 1593.5 [I.V.:1243.5; IV Piggyback:350] Out: 170 [Urine:150; Blood:20] Last BM Date : 06/15/21  Physical Exam: Constitutional: No acute distress Rectal: External exam reveals raw tissue from her hemorrhoidectomy but no active bleeding at this point.  Digital rectal exam deferred.  Labs:  Recent Labs    06/15/21 0641 06/16/21 0435  WBC 5.3 8.6  HGB 8.5* 8.8*  HCT 27.4* 28.6*  PLT 125* 150   Recent Labs    06/15/21 0641 06/16/21 0435  NA 135 135  K 4.2 4.1  CL 105 103  CO2 24 27  GLUCOSE 129* 129*  BUN 6* <5*  CREATININE 0.91 0.83  CALCIUM 8.1* 8.2*   Recent Labs    06/14/21 0356  LABPROT 14.4  INR 1.1    Imaging: No results found.  Assessment/Plan: This is a 66 y.o. female s/p exam under anesthesia and hemorrhoidectomy.  - Patient is doing well today with stable hemoglobin and no gross bleeding.  Discussed with her that it is normal to have some mild oozing due to the raw tissue from the surgery but there should not be gross profuse bleeding.  If that is the case, she needs to contact us right away or come to the hospital for further evaluation. - Patient denies any pain currently.  Hemorrhoidectomy was mostly internal in all columns rather than external.  We did inject Exparel into the tissues as well which could be contributing as well to good pain control.  As a precaution we will send her with a prescription  for 2 different pain medications.  Reviewed discharge instructions with her. - Advised to wait until tomorrow to resume her aspirin. - May be discharged to home today from the surgical standpoint and can follow-up with [place] in about 2 weeks.   Melvyn Neth, [facility]

## 2021-06-16 NOTE — Progress Notes (Signed)
PROGRESS NOTE    Christina French  EUM:[id number] DOB: [birth year] DOA: 06/13/2021 PCP: [name], MD    No chief complaint on file.   Brief Narrative:  Patient is a pleasant 66 year old female history of hemorrhoids, diabetes, hypertension, seizure disorder, history of back presenting as a direct admission from PCPs office with ongoing rectal bleeding times several weeks, symptomatic anemia, hemoglobin noted at 5.4 from 7.4 (05/27/2021).  Patient admitted, transfused 3 units packed red blood cells.  GI consulted recommended hemorrhoidal banding versus surgical hemorrhoidectomy however patient adamant for surgical hemorrhoidectomy and as such general surgery consulted.  Patient scheduled for hemorrhoidectomy 06/15/2021.    Assessment & Plan:  Principal Problem:   GI bleed Active Problems:   Rectal bleeding   Symptomatic anemia   Chronic hyponatremia   Seizure disorder (HCC)   Essential hypertension   Anxiety disorder   Dehydration   Hyponatremia   Severe anemia   Uncontrolled type 2 diabetes mellitus with hyperglycemia (HCC)   Hyperlipidemia associated with type 2 diabetes mellitus (HCC)   Major depressive disorder, recurrent, in remission ([place])   Hypomagnesemia   Bleeding internal hemorrhoids    Assessment and Plan: * GI bleed- (present on admission) - Patient presented as a direct admission from PCPs office with several weeks of bright red blood per rectum with every bowel movement. -Patient feels likely hemorrhoidal in nature as she states has hemorrhoids the size of grapefruit. -Patient has been worked up for GI bleed in the past with a negative capsule endoscopy (01/06/2019), upper endoscopy done 12/21/2018 with Barrett's stage C 4 otherwise within normal limits.  Colonoscopy done 12/21/2020 with nonbleeding internal hemorrhoids noted. -Patient presenting from PCPs office with a hemoglobin of 5.4 from 7.4(05/27/2021).  Hemoglobin at 12.6 (01/30/2021). -Patient with complaints  of generalized fatigue, shortness of breath, noted to have pallor on admission which have improved posttransfusion.. -Patient vitals within normal limits and patient not tachycardic on admission. -Anemia panel consistent with severe iron deficiency anemia with iron level of 16, ferritin of 3. -Patient transfused 3 units packed red blood cells with hemoglobin currently at 8.8. -Change IV PPI to oral PPI daily. -Patient seen in consultation by GI, who noted patient has failed conservative management and recommended options of hemorrhoidal banding versus surgical hemorrhoidectomy. -Per GI patient noted to have refused banding at the office and wanted surgical hemorrhoidectomy. -GI recommending sitz bath, Anusol, high-fiber diet with the aim of 25 g of fiber per day. -GI recommending surgical consultation. -Patient seen in consultation by general surgery who has assessed the patient and patient adamant on hemorrhoidectomy and as such patient underwent hemorrhoidectomy all 3 columns on 06/15/2021 without any complications.   -Postoperatively patient doing well, denies any gross bleeding, endorses some oozing of blood from hemorrhoidectomy. -We will give IV Feraheme 510 mg x 1 today. -Continue oral iron supplementation. -GI has signed off. -General surgery following and appreciate input and recommendations.   Symptomatic anemia- (present on admission) - Patient presented with generalized fatigue, lightheadedness, shortness of breath, pallor. -Patient with complaints of ongoing rectal bleeding feels likely from hemorrhoids. -Hemoglobin of 5.4 on presentation, from 7.4(05/27/2021).  Hemoglobin noted at 12.6 (01/30/2021). -Status post transfusion 3 units packed red blood cells with hemoglobin at 8.8. -Patient with improvement with bloody bowel movements post hemorrhoidectomy (06/15/2021)/ -Anemia panel consistent with severe iron deficiency anemia. -We will give a dose of IV Feraheme x1. -See above GI  bleed.  Rectal bleeding- (present on admission) - See above GI bleed/symptomatic anemia.  Bleeding internal hemorrhoids - See GI bleed above.  Hypomagnesemia- (present on admission) - Magnesium at 1.2 on presentation.. -Phosphorus at 4.2. -Magnesium sulfate 4 g IV x1 given 06/14/2021 with magnesium currently at 1.8. -Magnesium sulfate 2 g IV x1. -Repeat labs in the morning.  Major depressive disorder, recurrent, in remission ([place])- (present on admission) - Continue home regimen Lexapro.  Hyperlipidemia associated with type 2 diabetes mellitus ([place])- (present on admission) - Crestor.  Uncontrolled type 2 diabetes mellitus with hyperglycemia ([place])- (present on admission) - Well-controlled type 2 diabetes mellitus -Hemoglobin A1c 5.2 (06/13/2021). -Continue to hold oral hypoglycemic agents. -CBG 152 this morning. -Change sliding scale insulin to ACHS.  Severe anemia- (present on admission) - See symptomatic anemia.  Hyponatremia- (present on admission) - Patient with a chronic hyponatremia -Currently stable.  Follow-up.  Dehydration- (present on admission) - IV fluids. -Status post transfusion 3 units packed red blood cells.    Anxiety disorder- (present on admission) - Continue home regimen Lexapro, Xanax as needed.  Essential hypertension- (present on admission) - Blood pressure borderline on 06/14/2021.   -Atenolol discontinued.   -BP improved.   -Continue propranolol.    Seizure disorder [facility]) - Patient with no seizures noted. -Continue home regimen of antiseizure medications.  Chronic hyponatremia- (present on admission) - Stable.         DVT prophylaxis: SCDs Code Status: Full Family Communication: Updated patient.  No family at bedside. Disposition: Likely home once clinically improved in 24 hours  Status is: Inpatient Remains inpatient appropriate because: Severity of illness.           Consultants:  Gastroenterology: Dr. Vicente Males  06/14/2021 General surgery: Dr. Hampton Abbot 06/14/2021  Procedures:  Hemorrhoidectomy of 3 columns per general surgery, Dr. Hampton Abbot 06/15/2021. Transfused 3 units packed red blood cells.  Antimicrobials:  None   Subjective: Laying in bed.  Just urinated with a urine output of 1 L.  Nurse tech at bedside.  No chest pain.  No shortness of breath.  No abdominal pain.  Feels fatigued has improved.  Patient states bloody bowel movements have slowed down and just has some oozing from surgery from yesterday.  Denies significant gross bleeding as prior to admission.  Overall feeling well.   Objective: Vitals:   06/15/21 1942 06/16/21 0406 06/16/21 0500 06/16/21 0927  BP: (!) 132/94 123/78  (!) 142/73  Pulse: 80 79  76  Resp: 20 16  16   Temp: 97.6 F (36.4 C) 98.2 F (36.8 C)  99.7 F (37.6 C)  TempSrc:    Axillary  SpO2: 96% 100%  100%  Weight:   85.2 kg   Height:        Intake/Output Summary (Last 24 hours) at 06/16/2021 1019 Last data filed at 06/16/2021 1009 Gross per 24 hour  Intake 1713.49 ml  Output 1170 ml  Net 543.49 ml   Filed Weights   06/14/21 0629 06/15/21 0445 06/16/21 0500  Weight: 81 kg 82 kg 85.2 kg    Examination:  General exam: NAD. Respiratory system: CTA B.  No wheezes, no crackles, no rhonchi.  Normal respiratory effort.  Speaking in full sentences.   Cardiovascular system: RRR no murmurs rubs or gallops.  No JVD.  No lower extremity edema.  Gastrointestinal system: Abdomen is soft, nontender, nondistended, positive bowel sounds.  No rebound.  No guarding.  Central nervous system: Alert and oriented. No focal neurological deficits. Extremities: Symmetric 5 x 5 power. Skin: No rashes, lesions or ulcers Psychiatry: Judgement and insight appear normal.  Mood & affect appropriate.     Data Reviewed:   CBC: Recent Labs  Lab 06/13/21 1234 06/14/21 0356 06/14/21 0947 06/14/21 1634 06/15/21 0641 06/16/21 0435  WBC 6.8 4.6  --   --  5.3 8.6  NEUTROABS 5.1   --   --   --   --  6.8  HGB 5.4* 6.0* 7.8* 8.6* 8.5* 8.8*  HCT 19.5* 20.2* 25.5* 27.2* 27.4* 28.6*  MCV 71.2* 73.2*  --   --  77.0* 78.4*  PLT 164 129*  --   --  125* 654    Basic Metabolic Panel: Recent Labs  Lab 06/13/21 1234 06/14/21 0356 06/15/21 0641 06/16/21 0435  NA 131* 132* 135 135  K 4.0 3.7 4.2 4.1  CL 97* 102 105 103  CO2 25 24 24 27   GLUCOSE 161* 94 129* 129*  BUN 10 8 6* <5*  CREATININE 0.93 0.87 0.91 0.83  CALCIUM 9.6 8.5* 8.1* 8.2*  MG  --  1.2* 2.0 1.8  PHOS  --  4.2  --   --     GFR: Estimated Creatinine Clearance: 67.9 mL/min (by C-G formula based on SCr of 0.83 mg/dL).  Liver Function Tests: Recent Labs  Lab 06/14/21 0356  AST 10*  ALT 8  ALKPHOS 36*  BILITOT 1.1  PROT 5.2*  ALBUMIN 3.1*    CBG: Recent Labs  Lab 06/15/21 1719 06/15/21 2036 06/16/21 0001 06/16/21 0403 06/16/21 0904  GLUCAP 97 129* 69* 152* 105*     Recent Results (from the past 240 hour(s))  Resp Panel by RT-PCR (Flu A&B, Covid) Nasopharyngeal Swab     Status: None   Collection Time: 06/13/21  6:30 PM   Specimen: Nasopharyngeal Swab; Nasopharyngeal(NP) swabs in vial transport medium  Result Value Ref Range Status   SARS Coronavirus 2 by RT PCR NEGATIVE NEGATIVE Final    Comment: (NOTE) SARS-CoV-2 target nucleic acids are NOT DETECTED.  The SARS-CoV-2 RNA is generally detectable in upper respiratory specimens during the acute phase of infection. The lowest concentration of SARS-CoV-2 viral copies this assay can detect is 138 copies/mL. A negative result does not preclude SARS-Cov-2 infection and should not be used as the sole basis for treatment or other patient management decisions. A negative result may occur with  improper specimen collection/handling, submission of specimen other than nasopharyngeal swab, presence of viral mutation(s) within the areas targeted by this assay, and inadequate number of viral copies(<138 copies/mL). A negative result must be  combined with clinical observations, patient history, and epidemiological information. The expected result is Negative.  Fact Sheet for Patients:  EntrepreneurPulse.com.au  Fact Sheet for Healthcare Providers:  IncredibleEmployment.be  This test is no t yet approved or cleared by the [place] FDA and  has been authorized for detection and/or diagnosis of SARS-CoV-2 by FDA under an Emergency Use Authorization (EUA). This EUA will remain  in effect (meaning this test can be used) for the duration of the COVID-19 declaration under Section 564(b)(1) of the Act, 21 U.S.C.section 360bbb-3(b)(1), unless the authorization is terminated  or revoked sooner.       Influenza A by PCR NEGATIVE NEGATIVE Final   Influenza B by PCR NEGATIVE NEGATIVE Final    Comment: (NOTE) The Xpert Xpress SARS-CoV-2/FLU/RSV plus assay is intended as an aid in the diagnosis of influenza from Nasopharyngeal swab specimens and should not be used as a sole basis for treatment. Nasal washings and aspirates are unacceptable for Xpert Xpress SARS-CoV-2/FLU/RSV testing.  Fact Sheet for  Patients: EntrepreneurPulse.com.au  Fact Sheet for Healthcare Providers: IncredibleEmployment.be  This test is not yet approved or cleared by the [place] FDA and has been authorized for detection and/or diagnosis of SARS-CoV-2 by FDA under an Emergency Use Authorization (EUA). This EUA will remain in effect (meaning this test can be used) for the duration of the COVID-19 declaration under Section 564(b)(1) of the Act, 21 U.S.C. section 360bbb-3(b)(1), unless the authorization is terminated or revoked.  Performed at [facility], [address] [place], [place] [address]   Urine Culture     Status: Abnormal   Collection Time: 06/13/21  7:30 PM   Specimen: Urine, Random  Result Value Ref Range Status   Specimen Description   Final     URINE, RANDOM Performed at [facility], [address], [place],  [address]    Special Requests   Final    NONE Performed at [facility], [address] [place],  [address]    Culture MULTIPLE SPECIES PRESENT, SUGGEST RECOLLECTION (A)  Final   Report Status 06/15/2021 FINAL  Final         Radiology Studies: No results found.      Scheduled Meds:  escitalopram  20 mg Oral Daily   ferrous sulfate  325 mg Oral BID   gabapentin  100 mg Oral BID   hydrocortisone   Rectal TID   insulin aspart  0-9 Units Subcutaneous Q4H   lacosamide  150 mg Oral BID   levETIRAcetam  500 mg Oral BID   montelukast  10 mg Oral QHS   pantoprazole (PROTONIX) IV  40 mg Intravenous Q12H   polyethylene glycol  17 g Oral Daily   propranolol  10 mg Oral TID   rosuvastatin  5 mg Oral Daily   sodium chloride flush  3 mL Intravenous Q12H   traZODone  150 mg Oral QHS   Continuous Infusions:  ferumoxytol     magnesium sulfate bolus IVPB       LOS: 3 days    Time spent: 35 minutes    Irine Seal, MD [facility]   To contact the attending provider between 7A-7P or the covering provider during after hours 7P-7A, please log into the web site [email or web address] and access using universal [place] password for that web site. If you do not have the password, please call the hospital operator.  06/16/2021, 10:19 AM

## 2021-06-17 ENCOUNTER — Other Ambulatory Visit: Payer: Self-pay | Admitting: Nurse Practitioner

## 2021-06-17 DIAGNOSIS — K625 Hemorrhage of anus and rectum: Secondary | ICD-10-CM

## 2021-06-17 DIAGNOSIS — L509 Urticaria, unspecified: Secondary | ICD-10-CM

## 2021-06-17 LAB — CBC
HCT: 27.4 % — ABNORMAL LOW (ref 36.0–46.0)
Hemoglobin: 8.3 g/dL — ABNORMAL LOW (ref 12.0–15.0)
MCH: 23.9 pg — ABNORMAL LOW (ref 26.0–34.0)
MCHC: 30.3 g/dL (ref 30.0–36.0)
MCV: 79 fL — ABNORMAL LOW (ref 80.0–100.0)
Platelets: 128 10*3/uL — ABNORMAL LOW (ref 150–400)
RBC: 3.47 MIL/uL — ABNORMAL LOW (ref 3.87–5.11)
RDW: 19.5 % — ABNORMAL HIGH (ref 11.5–15.5)
WBC: 7.1 10*3/uL (ref 4.0–10.5)
nRBC: 0 % (ref 0.0–0.2)

## 2021-06-17 LAB — BASIC METABOLIC PANEL WITH GFR
Anion gap: 7 (ref 5–15)
BUN: <5 mg/dL — ABNORMAL LOW (ref 8–23)
CO2: 23 mmol/L (ref 22–32)
Calcium: 8.3 mg/dL — ABNORMAL LOW (ref 8.9–10.3)
Chloride: 105 mmol/L (ref 98–111)
Creatinine, Ser: 0.81 mg/dL (ref 0.44–1.00)
GFR, Estimated: >60 mL/min
Glucose, Bld: 118 mg/dL — ABNORMAL HIGH (ref 70–99)
Potassium: 3.9 mmol/L (ref 3.5–5.1)
Sodium: 135 mmol/L (ref 135–145)

## 2021-06-17 LAB — PREPARE RBC (CROSSMATCH)

## 2021-06-17 LAB — MAGNESIUM: Magnesium: 2 mg/dL (ref 1.7–2.4)

## 2021-06-17 LAB — GLUCOSE, CAPILLARY: Glucose-Capillary: 124 mg/dL — ABNORMAL HIGH (ref 70–99)

## 2021-06-17 LAB — SURGICAL PATHOLOGY

## 2021-06-17 MED ORDER — POLYETHYLENE GLYCOL 3350 17 G PO PACK
17.0000 g | PACK | Freq: Every day | ORAL | 0 refills | Status: DC
Start: 2021-06-18 — End: 2023-06-02

## 2021-06-17 MED ORDER — HYDROCORTISONE (PERIANAL) 2.5 % EX CREA: TOPICAL_CREAM | Freq: Three times a day (TID) | CUTANEOUS | 0 refills | Status: AC

## 2021-06-17 MED ORDER — ASPIRIN 81 MG PO TBEC: 81.0000 mg | DELAYED_RELEASE_TABLET | Freq: Every morning | ORAL | 1 refills | Status: DC

## 2021-06-17 MED ORDER — FERROUS SULFATE 325 (65 FE) MG PO TABS: 325.0000 mg | ORAL_TABLET | Freq: Two times a day (BID) | ORAL | 3 refills | Status: DC

## 2021-06-17 NOTE — Progress Notes (Signed)
Mobility Specialist - Progress Note   06/17/21 1100  Mobility  Activity Stood at bedside;Dangled on edge of bed  Range of Motion/Exercises Active  Level of Assistance Standby assist, set-up cues, supervision of patient - no hands on  Assistive Device None  Activity Response Tolerated well  $Mobility charge 1 Mobility     Pt assisted with UB/LB dressing change. Supervision for transfers. Assist to doff sock on RLE only.  Pt to be d/c this date.   Kathee Delton Mobility Specialist 06/17/21, 11:35 AM

## 2021-06-17 NOTE — Progress Notes (Signed)
Patient medically cleared to discharge with discharge orders placed by MD.  Patient is to be transported home by family with AVS reviewed with patient by primary RN including changes to medications and follow up appointments with all questions and concerns addressed by primary RN. PIV removed prior to discharge and patient is to be transported off the unit by nursing staff shortly.

## 2021-06-17 NOTE — Discharge Summary (Signed)
Physician Discharge Summary  [name] FXT:[id number] DOB: 11/23/1955 DOA: 06/13/2021  PCP: [name], MD  Admit date: 06/13/2021 Discharge date: 06/17/2021  Time spent: 60 minutes  Recommendations for Outpatient Follow-up:  Follow-up with [name], MD in 2 weeks.  On follow-up patient will need a basic metabolic profile, magnesium level done to follow-up on electrolytes and renal function.  Patient will also need a CBC done to follow-up on hemoglobin.  Patient blood pressure need to be reassessed on follow-up. Follow-up with Dr. Hampton Abbot, general surgery in 2 weeks.   Discharge Diagnoses:  Principal Problem:   GI bleed Active Problems:   Rectal bleeding   Symptomatic anemia   Chronic hyponatremia   Seizure disorder (HCC)   Essential hypertension   Anxiety disorder   Dehydration   Hyponatremia   Severe anemia   Uncontrolled type 2 diabetes mellitus with hyperglycemia ([place])   Hyperlipidemia associated with type 2 diabetes mellitus (HCC)   Major depressive disorder, recurrent, in remission ([place])   Hypomagnesemia   Bleeding internal hemorrhoids   Discharge Condition: Stable and improved.  Diet recommendation: Carb modified diet  Filed Weights   06/15/21 0445 06/16/21 0500 06/17/21 2992  Weight: 82 kg 85.2 kg 80.9 kg    History of present illness:  Christina French is a 66 y.o. female with medical history significant of hemorrhoids, diabetes mellitus well-controlled, hypertension, seizure disorder, history of Barrett's, presenting as a direct admission from PCPs office for ongoing rectal bleeding which patient states has been ongoing for several weeks.  Patient describes bleeding with every bowel movement and describes it as bright red bloody bowel movement and feels it is from her hemorrhoids which she states are the size of grapes and feel needs surgical procedure/input from general surgery.  Patient with complaints of generalized fatigue, weakness, shortness of breath,  fatigue, nausea, diffuse abdominal pain more in the lower abdominal region..  Patient endorses an episode of syncope 4 weeks prior to admission with none since then.  Patient states always feeling cold.  Patient states has had weight loss from 206 pounds to 178 pounds over several months.  Patient denies any fevers, no chills, no emesis, no melena, no hematemesis.  Patient denies any dysuria.  Patient noted to have been seen in the ED on May 27, 2021 at which time noted to have a hemoglobin of 7.4 and stated was assessed and subsequently discharged.  Patient followed up at PCPs office with complaints of rectal bleeding.  CBC done at PCPs office with a hemoglobin of 5.4, MCV of 71.2.  Hospitalist service was called for direct admission.  At PCPs office patient noted to have stable vital signs. ED Course: Patient direct admission from PCPs office.  Hospital Course:   Assessment and Plan: * GI bleed- (present on admission) - Patient presented as a direct admission from PCPs office with several weeks of bright red blood per rectum with every bowel movement. -Patient feels likely hemorrhoidal in nature as she states has hemorrhoids the size of grapefruit. -Patient has been worked up for GI bleed in the past with a negative capsule endoscopy (01/06/2019), upper endoscopy done 12/21/2018 with Barrett's stage C 4 otherwise within normal limits.  Colonoscopy done 12/21/2020 with nonbleeding internal hemorrhoids noted. -Patient presenting from PCPs office with a hemoglobin of 5.4 from 7.4(05/27/2021).  Hemoglobin at 12.6 (01/30/2021). -Patient with complaints of generalized fatigue, shortness of breath, noted to have pallor on admission which have improved posttransfusion and had resolved by day  of discharge.. -Patient vitals within normal limits and patient not tachycardic on admission. -Anemia panel consistent with severe iron deficiency anemia with iron level of 16, ferritin of 3. -Patient transfused 3 units  packed red blood cells with hemoglobin currently at 8.8. -Patient maintained on PPI during the hospitalization. -Patient seen in consultation by GI, who noted patient has failed conservative management and recommended options of hemorrhoidal banding versus surgical hemorrhoidectomy. -Per GI patient noted to have refused banding at the office and wanted surgical hemorrhoidectomy. -GI recommended sitz bath, Anusol, high-fiber diet with the aim of 25 g of fiber per day. -GI recommended surgical consultation. -Patient seen in consultation by general surgery who has assessed the patient and patient adamant on hemorrhoidectomy and as such patient underwent hemorrhoidectomy all 3 columns on 06/15/2021 without any complications.   -Postoperatively patient doing well, denied any gross bleeding, endorsed some oozing of blood from hemorrhoidectomy site which improved daily. -Patient received IV Feraheme 510 mg x 1 on 06/16/2021 without any complications. -Patient was seen by GI who signed off. -Patient resumed on home regimen oral iron supplementation. -Patient improved clinically, hemoglobin stabilized at 8.3 by day of discharge. -Patient was seen and followed by general surgery during the hospitalization and will be discharged home in stable and improved condition with outpatient follow-up with general surgery 2 weeks postdischarge.   Symptomatic anemia- (present on admission) - Patient presented with generalized fatigue, lightheadedness, shortness of breath, pallor. -Patient with complaints of ongoing rectal bleeding feels likely from hemorrhoids early on during the hospitalization. -Hemoglobin of 5.4 on presentation, from 7.4(05/27/2021).  Hemoglobin noted at 12.6 (01/30/2021). -Status post transfusion 3 units packed red blood cells with hemoglobin stabilizing at 8.3 by day of discharge. -Patient with improvement with bloody bowel movements post hemorrhoidectomy (06/15/2021)/ -Anemia panel consistent with  severe iron deficiency anemia. -Patient received IV Feraheme x1 during the hospitalization.  -See above GI bleed.   -Outpatient follow-up with PCP.   Rectal bleeding- (present on admission) - See above GI bleed/symptomatic anemia.  Bleeding internal hemorrhoids - See GI bleed above.  Hypomagnesemia- (present on admission) - Magnesium at 1.2 on presentation.. -Phosphorus at 4.2. -Magnesium was repleted during the hospitalization.  -Outpatient follow-up with PCP.   Major depressive disorder, recurrent, in remission ([place])- (present on admission) -Patient maintained on home regimen Lexapro.  Hyperlipidemia associated with type 2 diabetes mellitus ([place])- (present on admission) - Crestor.  Uncontrolled type 2 diabetes mellitus with hyperglycemia ([place])- (present on admission) - Well-controlled type 2 diabetes mellitus -Hemoglobin A1c 5.2 (06/13/2021). -Patient's oral hypoglycemic agents were held during the hospitalization and patient maintained on sliding scale insulin.  -Oral hypoglycemic agents will be resumed on discharge.    Severe anemia- (present on admission) - See symptomatic anemia.  Hyponatremia- (present on admission) - Patient with a chronic hyponatremia -Outpatient follow-up.   Dehydration- (present on admission) - Patient hydrated with IV fluids.  -Status post transfusion 3 units packed red blood cells.    Anxiety disorder- (present on admission) -Patient maintained on home regimen Lexapro, Xanax as needed.  Essential hypertension- (present on admission) - Blood pressure borderline on 06/14/2021.   -Atenolol discontinued.   -BP improved.   -Patient maintained on home regimen propranolol.   -Patient's atenolol will be discontinued on discharge.  -Outpatient follow-up with PCP  Seizure disorder ([place]) - Patient with no seizures noted. -Patient was maintained on home regimen of antiseizure medications.  Chronic hyponatremia- (present on admission) -  Stable.  Procedures: Hemorrhoidectomy of 3 columns per general surgery, Dr. Hampton Abbot 06/15/2021. Transfused 3 units packed red blood cells. IV Feraheme 06/16/2021  Consultations: Gastroenterology: Dr. Vicente Males 06/14/2021 General surgery: Dr. Hampton Abbot 06/14/2021    Discharge Exam: Vitals:   06/17/21 0413 06/17/21 0739  BP: 136/82 138/85  Pulse: 78 72  Resp: 20 18  Temp: 98.6 F (37 C) 98.1 F (36.7 C)  SpO2: 95% 100%    General: NAD. Cardiovascular: RRR no murmurs rubs or gallops.  No JVD.  No lower extremity edema. Respiratory: Clear to auscultation bilaterally.  No wheezes, no crackles, no rhonchi.  Discharge Instructions   Discharge Instructions     Diet Carb Modified   Complete by: As directed    Discharge wound care:   Complete by: As directed    As above.   Increase activity slowly   Complete by: As directed       Allergies as of 06/17/2021       Reactions   Morphine And Related Other (See Comments)   Blacked out once        Medication List     STOP taking these medications    atenolol 50 MG tablet Commonly known as: TENORMIN   ferrous sulfate 325 (65 FE) MG EC tablet Replaced by: ferrous sulfate 325 (65 FE) MG tablet       TAKE these medications    Accu-Chek Guide test strip Generic drug: glucose blood Use as directed twice a strips twice a day E 11.65   Accu-Chek Softclix Lancets lancets Use as instructed twice a day Dx E11.65   acetaminophen 500 MG tablet Commonly known as: TYLENOL Take 2 tablets (1,000 mg total) by mouth every 6 (six) hours as needed for mild pain or fever. What changed:  when to take this reasons to take this   ALPRAZolam 1 MG tablet Commonly known as: XANAX Take 1 tablet (1 mg total) by mouth 3 (three) times daily as needed for anxiety.   aspirin 81 MG EC tablet Commonly known as: Aspirin Low Dose Take 1 tablet (81 mg total) by mouth every morning. Swallow whole. Start taking on: June 20, 2021 What changed:  how much to take additional instructions These instructions start on June 20, 2021. If you are unsure what to do until then, ask your doctor or other care provider.   augmented betamethasone dipropionate 0.05 % ointment Commonly known as: Diprolene Apply topically 2 (two) times daily.   cetirizine 10 MG tablet Commonly known as: ZYRTEC TAKE 1 TABLET BY MOUTH TWICE A DAY FOR RASH. IF RASH AND ITCH STILL BOTHERSOME AND NO SIDE EFFECTS (DRY MOUTH, BLURRY VISION), INCREASE TO 2 TABS TWICE A DAY.   cyclobenzaprine 10 MG tablet Commonly known as: FLEXERIL Take 1 tablet (10 mg total) by mouth 3 (three) times daily as needed for muscle spasms.   Dexcom G6 Receiver Devi Use as directed DX ell.65   Dexcom G6 Sensor Misc Use every 10 days dx e11.65   Dexcom G6 Transmitter Misc Use as directed   escitalopram 20 MG tablet Commonly known as: LEXAPRO Take 20 mg by mouth daily.   famotidine 20 MG tablet Commonly known as: PEPCID TAKE 1 TABLET BY MOUTH TWICE A DAY   ferrous sulfate 325 (65 FE) MG tablet Take 1 tablet (325 mg total) by mouth 2 (two) times daily. Replaces: ferrous sulfate 325 (65 FE) MG EC tablet   gabapentin 100 MG capsule Commonly known as: NEURONTIN TAKE ONE CAPSULE BY MOUTH TWICE  A DAY   glimepiride 2 MG tablet Commonly known as: AMARYL Take 1 tablet (2 mg total) by mouth daily before breakfast.   hydrocortisone 2.5 % rectal cream Commonly known as: ANUSOL-HC Place rectally 3 (three) times daily for 10 days.   hydrOXYzine 25 MG tablet Commonly known as: ATARAX Take 25 mg by mouth 3 (three) times daily as needed.   ibuprofen 600 MG tablet Commonly known as: ADVIL Take 1 tablet (600 mg total) by mouth every 8 (eight) hours as needed for moderate pain.   Lacosamide 150 MG Tabs Take 150 mg by mouth 2 (two) times daily.   levETIRAcetam 500 MG tablet Commonly known as: KEPPRA Take 500 mg by mouth 2 (two) times daily.   lisinopril 5  MG tablet Commonly known as: ZESTRIL TAKE 1 TABLET BY MOUTH DAILY   metFORMIN 500 MG tablet Commonly known as: GLUCOPHAGE TAKE 1 TABLET BY MOUTH TWICE A DAY WITH A MEAL   montelukast 10 MG tablet Commonly known as: SINGULAIR Take 1 tablet (10 mg total) by mouth at bedtime.   oxyCODONE 5 MG immediate release tablet Commonly known as: Oxy IR/ROXICODONE Take 1 tablet (5 mg total) by mouth every 6 (six) hours as needed for severe pain.   pantoprazole 40 MG tablet Commonly known as: PROTONIX Take 1 tablet (40 mg total) by mouth 2 (two) times daily.   polyethylene glycol 17 g packet Commonly known as: MIRALAX / GLYCOLAX Take 17 g by mouth daily. Start taking on: June 18, 2021   propranolol 10 MG tablet Commonly known as: INDERAL Take 10 mg by mouth 3 (three) times daily.   rosuvastatin 5 MG tablet Commonly known as: CRESTOR TAKE 1 TABLET BY MOUTH DAILY   traZODone 150 MG tablet Commonly known as: DESYREL Take 150 mg by mouth at bedtime.               Discharge Care Instructions  (From admission, onward)           Start     Ordered   06/17/21 0000  Discharge wound care:       Comments: As above.   06/17/21 1032           Allergies  Allergen Reactions   Morphine And Related Other (See Comments)    Blacked out once    Follow-up Information     Olean Ree, MD. Go on 07/02/2021.   Specialty: General Surgery Why: 8:45am appointment Contact information: [address] [place] [address] [phone]         [name], MD. Daphane Shepherd on 06/19/2021.   Specialties: Internal Medicine, Cardiology Why: 11:40 am appointment Contact information: [place] [address] [phone]                  The results of significant diagnostics from this hospitalization (including imaging, microbiology, ancillary and laboratory) are listed below for reference.    Significant Diagnostic Studies: CT Head Wo  Contrast  Result Date: 05/28/2021 CLINICAL DATA:  Facial trauma, blunt. EXAM: CT HEAD WITHOUT CONTRAST CT MAXILLOFACIAL WITHOUT CONTRAST TECHNIQUE: Multidetector CT imaging of the head and maxillofacial structures were performed using the standard protocol without intravenous contrast. Multiplanar CT image reconstructions of the maxillofacial structures were also generated. RADIATION DOSE REDUCTION: This exam was performed according to the departmental dose-optimization program which includes automated exposure control, adjustment of the mA and/or kV according to patient size and/or use of iterative reconstruction technique. COMPARISON:  10/11/2020, 11/23/2014.  FINDINGS: CT HEAD FINDINGS Brain: No acute intracranial hemorrhage, midline shift or mass effect. No extra-axial fluid collection. Diffuse atrophy is noted. No hydrocephalus. Vascular: No hyperdense vessel or unexpected calcification. Skull: Normal. Negative for fracture or focal lesion. Other: None. CT MAXILLOFACIAL FINDINGS Osseous: No fracture or mandibular dislocation. No destructive process. Orbits: Negative. No traumatic or inflammatory finding. Sinuses: A trace amount of debris is present in the left sphenoid sinus. Soft tissues: Negative. IMPRESSION: 1. No acute intracranial process. 2. Mild cerebral atrophy. 3. No evidence of facial bone fracture. Electronically Signed   By: Brett Fairy M.D.   On: 05/28/2021 00:19   CT Angio Chest PE W and/or Wo Contrast  Result Date: 05/28/2021 CLINICAL DATA:  Shortness of breath, dizziness, fall EXAM: CT ANGIOGRAPHY CHEST WITH CONTRAST TECHNIQUE: Multidetector CT imaging of the chest was performed using the standard protocol during bolus administration of intravenous contrast. Multiplanar CT image reconstructions and MIPs were obtained to evaluate the vascular anatomy. RADIATION DOSE REDUCTION: This exam was performed according to the departmental dose-optimization program which includes automated exposure  control, adjustment of the mA and/or kV according to patient size and/or use of iterative reconstruction technique. CONTRAST:  9mL OMNIPAQUE IOHEXOL 350 MG/ML SOLN COMPARISON:  Chest radiograph dated 04/25/2017. CT chest dated 11/24/2014. FINDINGS: Cardiovascular: Satisfactory opacification the bilateral pulmonary arteries to the segmental level. No evidence of pulmonary embolism. No evidence thoracic aortic aneurysm or dissection. Atherosclerotic calcifications of the aortic arch. The heart is top-normal in size.  No pericardial effusion. Mediastinum/Nodes: No suspicious mediastinal lymphadenopathy. Visualized thyroid is unremarkable. Lungs/Pleura: Evaluation of the lung parenchyma is constrained by respiratory motion. Within that constraint, there are no suspicious pulmonary nodules. No focal consolidation. No pleural effusion or pneumothorax. Upper Abdomen: Visualized upper abdomen is grossly unremarkable. Musculoskeletal: Mild degenerative changes of the visualized thoracolumbar spine. Old left posterolateral rib fracture deformities. Review of the MIP images confirms the above findings. IMPRESSION: No evidence of pulmonary embolism. No evidence of acute cardiopulmonary disease. Aortic Atherosclerosis (ICD10-I70.0). Electronically Signed   By: Julian Hy M.D.   On: 05/28/2021 00:14   CT Maxillofacial Wo Contrast  Result Date: 05/28/2021 CLINICAL DATA:  Facial trauma, blunt. EXAM: CT HEAD WITHOUT CONTRAST CT MAXILLOFACIAL WITHOUT CONTRAST TECHNIQUE: Multidetector CT imaging of the head and maxillofacial structures were performed using the standard protocol without intravenous contrast. Multiplanar CT image reconstructions of the maxillofacial structures were also generated. RADIATION DOSE REDUCTION: This exam was performed according to the departmental dose-optimization program which includes automated exposure control, adjustment of the mA and/or kV according to patient size and/or use of iterative  reconstruction technique. COMPARISON:  10/11/2020, 11/23/2014. FINDINGS: CT HEAD FINDINGS Brain: No acute intracranial hemorrhage, midline shift or mass effect. No extra-axial fluid collection. Diffuse atrophy is noted. No hydrocephalus. Vascular: No hyperdense vessel or unexpected calcification. Skull: Normal. Negative for fracture or focal lesion. Other: None. CT MAXILLOFACIAL FINDINGS Osseous: No fracture or mandibular dislocation. No destructive process. Orbits: Negative. No traumatic or inflammatory finding. Sinuses: A trace amount of debris is present in the left sphenoid sinus. Soft tissues: Negative. IMPRESSION: 1. No acute intracranial process. 2. Mild cerebral atrophy. 3. No evidence of facial bone fracture. Electronically Signed   By: Brett Fairy M.D.   On: 05/28/2021 00:19    Microbiology: Recent Results (from the past 240 hour(s))  Resp Panel by RT-PCR (Flu A&B, Covid) Nasopharyngeal Swab     Status: None   Collection Time: 06/13/21  6:30 PM   Specimen: Nasopharyngeal Swab; Nasopharyngeal(NP)  swabs in vial transport medium  Result Value Ref Range Status   SARS Coronavirus 2 by RT PCR NEGATIVE NEGATIVE Final    Comment: (NOTE) SARS-CoV-2 target nucleic acids are NOT DETECTED.  The SARS-CoV-2 RNA is generally detectable in upper respiratory specimens during the acute phase of infection. The lowest concentration of SARS-CoV-2 viral copies this assay can detect is 138 copies/mL. A negative result does not preclude SARS-Cov-2 infection and should not be used as the sole basis for treatment or other patient management decisions. A negative result may occur with  improper specimen collection/handling, submission of specimen other than nasopharyngeal swab, presence of viral mutation(s) within the areas targeted by this assay, and inadequate number of viral copies(<138 copies/mL). A negative result must be combined with clinical observations, patient history, and  epidemiological information. The expected result is Negative.  Fact Sheet for Patients:  EntrepreneurPulse.com.au  Fact Sheet for Healthcare Providers:  IncredibleEmployment.be  This test is no t yet approved or cleared by the [place] FDA and  has been authorized for detection and/or diagnosis of SARS-CoV-2 by FDA under an Emergency Use Authorization (EUA). This EUA will remain  in effect (meaning this test can be used) for the duration of the COVID-19 declaration under Section 564(b)(1) of the Act, 21 U.S.C.section 360bbb-3(b)(1), unless the authorization is terminated  or revoked sooner.       Influenza A by PCR NEGATIVE NEGATIVE Final   Influenza B by PCR NEGATIVE NEGATIVE Final    Comment: (NOTE) The Xpert Xpress SARS-CoV-2/FLU/RSV plus assay is intended as an aid in the diagnosis of influenza from Nasopharyngeal swab specimens and should not be used as a sole basis for treatment. Nasal washings and aspirates are unacceptable for Xpert Xpress SARS-CoV-2/FLU/RSV testing.  Fact Sheet for Patients: EntrepreneurPulse.com.au  Fact Sheet for Healthcare Providers: IncredibleEmployment.be  This test is not yet approved or cleared by the [place] FDA and has been authorized for detection and/or diagnosis of SARS-CoV-2 by FDA under an Emergency Use Authorization (EUA). This EUA will remain in effect (meaning this test can be used) for the duration of the COVID-19 declaration under Section 564(b)(1) of the Act, 21 U.S.C. section 360bbb-3(b)(1), unless the authorization is terminated or revoked.  Performed at [facility], [address] [place], [place] [address]   Urine Culture     Status: Abnormal   Collection Time: 06/13/21  7:30 PM   Specimen: Urine, Random  Result Value Ref Range Status   Specimen Description   Final    URINE, RANDOM Performed at [facility], [address] [place], [place] [address]    Special Requests   Final    NONE Performed at [facility], [address] [place], [place] [address]    Culture MULTIPLE SPECIES PRESENT, SUGGEST RECOLLECTION (A)  Final   Report Status 06/15/2021 FINAL  Final     Labs: Basic Metabolic Panel: Recent Labs  Lab 06/13/21 1234 06/14/21 0356 06/15/21 0641 06/16/21 0435 06/17/21 0332  NA 131* 132* 135 135 135  K 4.0 3.7 4.2 4.1 3.9  CL 97* 102 105 103 105  CO2 25 24 24 27 23   GLUCOSE 161* 94 129* 129* 118*  BUN 10 8 6* <5* <5*  CREATININE 0.93 0.87 0.91 0.83 0.81  CALCIUM 9.6 8.5* 8.1* 8.2* 8.3*  MG  --  1.2* 2.0 1.8 2.0  PHOS  --  4.2  --   --   --    Liver Function Tests: Recent  Labs  Lab 06/14/21 0356  AST 10*  ALT 8  ALKPHOS 36*  BILITOT 1.1  PROT 5.2*  ALBUMIN 3.1*   No results for input(s): LIPASE, AMYLASE in the last 168 hours. No results for input(s): AMMONIA in the last 168 hours. CBC: Recent Labs  Lab 06/13/21 1234 06/14/21 0356 06/14/21 0947 06/14/21 1634 06/15/21 0641 06/16/21 0435 06/17/21 0332  WBC 6.8 4.6  --   --  5.3 8.6 7.1  NEUTROABS 5.1  --   --   --   --  6.8  --   HGB 5.4* 6.0* 7.8* 8.6* 8.5* 8.8* 8.3*  HCT 19.5* 20.2* 25.5* 27.2* 27.4* 28.6* 27.4*  MCV 71.2* 73.2*  --   --  77.0* 78.4* 79.0*  PLT 164 129*  --   --  125* 150 128*   Cardiac Enzymes: No results for input(s): CKTOTAL, CKMB, CKMBINDEX, TROPONINI in the last 168 hours. BNP: BNP (last 3 results) No results for input(s): BNP in the last 8760 hours.  ProBNP (last 3 results) No results for input(s): PROBNP in the last 8760 hours.  CBG: Recent Labs  Lab 06/16/21 0904 06/16/21 1217 06/16/21 1637 06/16/21 2105 06/17/21 0738  GLUCAP 105* 177* 117* 105* 124*       Signed:  Irine Seal MD.  [facility] 06/17/2021, 10:42 AM

## 2021-06-26 ENCOUNTER — Other Ambulatory Visit: Payer: Self-pay

## 2021-06-26 ENCOUNTER — Ambulatory Visit (INDEPENDENT_AMBULATORY_CARE_PROVIDER_SITE_OTHER): Payer: Medicare Other | Admitting: Nurse Practitioner

## 2021-06-26 ENCOUNTER — Other Ambulatory Visit
Admission: RE | Admit: 2021-06-26 | Discharge: 2021-06-26 | Disposition: A | Discharge status: Home / Self Care | Payer: Medicare Other | Source: Ambulatory Visit | Attending: Nurse Practitioner | Admitting: Nurse Practitioner

## 2021-06-26 ENCOUNTER — Encounter: Payer: Self-pay | Admitting: Nurse Practitioner

## 2021-06-26 VITALS — BP 105/65 | HR 75 | Temp 98.2°F | Resp 16 | Ht 62.0 in | Wt 178.8 lb

## 2021-06-26 DIAGNOSIS — D649 Anemia, unspecified: Secondary | ICD-10-CM | POA: Diagnosis not present

## 2021-06-26 DIAGNOSIS — E871 Hypo-osmolality and hyponatremia: Secondary | ICD-10-CM

## 2021-06-26 DIAGNOSIS — K625 Hemorrhage of anus and rectum: Secondary | ICD-10-CM

## 2021-06-26 LAB — CBC WITH DIFFERENTIAL/PLATELET
Abs Immature Granulocytes: 0.03 10*3/uL (ref 0.00–0.07)
Basophils Absolute: 0.1 10*3/uL (ref 0.0–0.1)
Basophils Relative: 1 %
Eosinophils Absolute: 0.2 10*3/uL (ref 0.0–0.5)
Eosinophils Relative: 3 %
HCT: 36.8 % (ref 36.0–46.0)
Hemoglobin: 11.3 g/dL — ABNORMAL LOW (ref 12.0–15.0)
Immature Granulocytes: 1 %
Lymphocytes Relative: 21 %
Lymphs Abs: 1.3 10*3/uL (ref 0.7–4.0)
MCH: 25.4 pg — ABNORMAL LOW (ref 26.0–34.0)
MCHC: 30.7 g/dL (ref 30.0–36.0)
MCV: 82.7 fL (ref 80.0–100.0)
Monocytes Absolute: 0.5 10*3/uL (ref 0.1–1.0)
Monocytes Relative: 8 %
Neutro Abs: 4.3 10*3/uL (ref 1.7–7.7)
Neutrophils Relative %: 66 %
Platelets: 161 10*3/uL (ref 150–400)
RBC: 4.45 MIL/uL (ref 3.87–5.11)
RDW: 22.1 % — ABNORMAL HIGH (ref 11.5–15.5)
Smear Review: NORMAL
WBC: 6.5 10*3/uL (ref 4.0–10.5)
nRBC: 0 % (ref 0.0–0.2)

## 2021-06-26 LAB — COMPREHENSIVE METABOLIC PANEL
ALT: 11 U/L (ref 0–44)
AST: 20 U/L (ref 15–41)
Albumin: 4.2 g/dL (ref 3.5–5.0)
Alkaline Phosphatase: 70 U/L (ref 38–126)
Anion gap: 8 (ref 5–15)
BUN: 12 mg/dL (ref 8–23)
CO2: 28 mmol/L (ref 22–32)
Calcium: 10 mg/dL (ref 8.9–10.3)
Chloride: 95 mmol/L — ABNORMAL LOW (ref 98–111)
Creatinine, Ser: 0.96 mg/dL (ref 0.44–1.00)
GFR, Estimated: >60 mL/min (ref >60)
Glucose, Bld: 97 mg/dL (ref 70–99)
Potassium: 4.5 mmol/L (ref 3.5–5.1)
Sodium: 131 mmol/L — ABNORMAL LOW (ref 135–145)
Total Bilirubin: 0.9 mg/dL (ref 0.3–1.2)
Total Protein: 7.2 g/dL (ref 6.5–8.1)

## 2021-06-26 LAB — MAGNESIUM: Magnesium: 1.7 mg/dL (ref 1.7–2.4)

## 2021-06-26 NOTE — Progress Notes (Cosign Needed)
[facility] [place] [id number] [facility] Discharge Acute Issues Care Follow Up                                                                        Patient Demographics  Christina French, is a 66 y.o. female  DOB [birth year]  MRN [id number].  Primary MD  Jonetta Osgood, NP   Reason for TCC follow Up -symptomatic anemia and rectal bleeding   Past Medical History:  Diagnosis Date   Anemia    Anxiety    Arthritis    Barrett esophagus    Cancer ([place]) 2016    Skin cancer right leg mylenoma   Depression    Diabetes ([place]) 04/25/2017   GERD (gastroesophageal reflux disease)    History of blood transfusion    with a surgery   History of bronchitis    History of pneumonia    Hypertension    IBS (irritable bowel syndrome)    Pneumonia 09/27/15   PTSD (post-traumatic stress disorder)    Renal insufficiency    reports acute kidney injury   Seizures ([place])    rare - last one 07/30/14 - was very anemic.  Silent seziure -June 2016-    Vertigo    thinks related to sinus issues    Past Surgical History:  Procedure Laterality Date   ABDOMINAL HYSTERECTOMY     CESAREAN SECTION     COLONOSCOPY N/A 08/31/2014   Procedure: COLONOSCOPY;  Surgeon: Lucilla Lame, MD;  Location: [place];  Service: Gastroenterology;  Laterality: N/A;   COLONOSCOPY N/A 08/05/2016   Procedure: COLONOSCOPY;  Surgeon: [name], MD;  Location: [facility] ENDOSCOPY;  Service: Endoscopy;  Laterality: N/A;   COLONOSCOPY WITH PROPOFOL N/A 12/22/2018   Procedure: COLONOSCOPY WITH PROPOFOL;  Surgeon: Virgel Manifold, MD;  Location: [facility] ENDOSCOPY;  Service: Endoscopy;  Laterality: N/A;   CYST EXCISION  04/27/2009   from throat   ESOPHAGOGASTRODUODENOSCOPY N/A 08/31/2014   Procedure: ESOPHAGOGASTRODUODENOSCOPY (EGD);  Surgeon: Lucilla Lame, MD;  Location: [place];  Service: Gastroenterology;  Laterality: N/A;   ESOPHAGOGASTRODUODENOSCOPY N/A 12/21/2018   Procedure: ESOPHAGOGASTRODUODENOSCOPY (EGD);  Surgeon: Virgel Manifold, MD;  Location: [facility] ENDOSCOPY;  Service: Endoscopy;  Laterality: N/A;   ESOPHAGOGASTRODUODENOSCOPY (EGD) WITH PROPOFOL N/A 07/01/2017   Procedure: ESOPHAGOGASTRODUODENOSCOPY (EGD) WITH PROPOFOL;  Surgeon: [name], MD;  Location: [facility] ENDOSCOPY;  Service: Gastroenterology;  Laterality: N/A;   FLEXIBLE SIGMOIDOSCOPY N/A 07/01/2017   Procedure: FLEXIBLE SIGMOIDOSCOPY;  Surgeon: [name], MD;  Location: [facility] ENDOSCOPY;  Service: Gastroenterology;  Laterality: N/A;   GIVENS CAPSULE STUDY  12/22/2018   Procedure: GIVENS CAPSULE STUDY;  Surgeon: Virgel Manifold, MD;  Location: [place] ENDOSCOPY;  Service: Endoscopy;;   HEMORRHOID SURGERY     HEMORRHOID SURGERY N/A 06/15/2021   Procedure: HEMORRHOIDECTOMY;  Surgeon: Olean Ree, MD;  Location: [facility] ORS;  Service: General;  Laterality: N/A;   HEMORRHOID SURGERY  06/14/2020   INCISION AND DRAINAGE HIP Right  11/25/2014   Procedure: IRRIGATION  AND DRAINAGEOF RIGHT HIP WITH PLACEMENT OF ANTIBIOUTIC BEADS;  Surgeon: Mcarthur Rossetti, MD;  Location: [place];  Service: Orthopedics;  Laterality: Right;   INCISION AND DRAINAGE HIP Right 11/28/2014   Procedure: Repeat I&D Right Hip;  Surgeon: Mcarthur Rossetti, MD;  Location: [place];  Service: Orthopedics;  Laterality: Right;   JOINT REPLACEMENT Right 04/27/2005   hip   LOOP RECORDER INSERTION N/A 04/04/2019   Procedure: LOOP RECORDER INSERTION;  Surgeon: Isaias Cowman, MD;  Location: [place] CV LAB;  Service: Cardiovascular;  Laterality: N/A;   TOTAL HIP REVISION Right 03/19/2015   Procedure: RIGHT TOTAL HIP ARTHROPLASTY REVISION;  Surgeon: Meredith Pel, MD;  Location: [place];  Service: Orthopedics;  Laterality: Right;   TOTAL HIP REVISION Right 11/18/2015   Procedure: TOTAL HIP REVISION;  Surgeon:  Frederik Pear, MD;  Location: [place];  Service: Orthopedics;  Laterality: Right;       Recent HPI and Hospital Course  History of present illness:  Christina French is a 66 y.o. female with medical history significant of hemorrhoids, diabetes mellitus well-controlled, hypertension, seizure disorder, history of Barrett's, presenting as a direct admission from PCPs office for ongoing rectal bleeding which patient states has been ongoing for several weeks.  Patient describes bleeding with every bowel movement and describes it as bright red bloody bowel movement and feels it is from her hemorrhoids which she states are the size of grapes and feel needs surgical procedure/input from general surgery.  Patient with complaints of generalized fatigue, weakness, shortness of breath, fatigue, nausea, diffuse abdominal pain more in the lower abdominal region..  Patient endorses an episode of syncope 4 weeks prior to admission with none since then.  Patient states always feeling cold.  Patient states has had weight loss from 206 pounds to 178 pounds over several months.  Patient denies any fevers, no chills, no emesis, no melena, no hematemesis.  Patient denies any dysuria.  Patient noted to have been seen in the ED on May 27, 2021 at which time noted to have a hemoglobin of 7.4 and stated was assessed and subsequently discharged.  Patient followed up at PCPs office with complaints of rectal bleeding.  CBC done at PCPs office with a hemoglobin of 5.4, MCV of 71.2.  Hospitalist service was called for direct admission.  At PCPs office patient noted to have stable vital signs. ED Course: Patient direct admission from PCPs office.   Hospital Course:    Assessment and Plan: * GI bleed- (present on admission) - Patient presented as a direct admission from PCPs office with several weeks of bright red blood per rectum with every bowel movement. -Patient feels likely hemorrhoidal in nature as she states has hemorrhoids the  size of grapefruit. -Patient has been worked up for GI bleed in the past with a negative capsule endoscopy (01/06/2019), upper endoscopy done 12/21/2018 with Barrett's stage C 4 otherwise within normal limits.  Colonoscopy done 12/21/2020 with nonbleeding internal hemorrhoids noted. -Patient presenting from PCPs office with a hemoglobin of 5.4 from 7.4(05/27/2021).  Hemoglobin at 12.6 (01/30/2021). -Patient with complaints of generalized fatigue, shortness of breath, noted to have pallor on admission which have improved posttransfusion and had resolved by day of discharge.. -Patient vitals within normal limits and patient not tachycardic on admission. -Anemia panel consistent with severe iron deficiency anemia with iron level of 16, ferritin of 3. -Patient transfused 3 units packed red blood cells with hemoglobin currently at  8.8. -Patient maintained on PPI during the hospitalization. -Patient seen in consultation by GI, who noted patient has failed conservative management and recommended options of hemorrhoidal banding versus surgical hemorrhoidectomy. -Per GI patient noted to have refused banding at the office and wanted surgical hemorrhoidectomy. -GI recommended sitz bath, Anusol, high-fiber diet with the aim of 25 g of fiber per day. -GI recommended surgical consultation. -Patient seen in consultation by general surgery who has assessed the patient and patient adamant on hemorrhoidectomy and as such patient underwent hemorrhoidectomy all 3 columns on 06/15/2021 without any complications.   -Postoperatively patient doing well, denied any gross bleeding, endorsed some oozing of blood from hemorrhoidectomy site which improved daily. -Patient received IV Feraheme 510 mg x 1 on 06/16/2021 without any complications. -Patient was seen by GI who signed off. -Patient resumed on home regimen oral iron supplementation. -Patient improved clinically, hemoglobin stabilized at 8.3 by day of discharge. -Patient was  seen and followed by general surgery during the hospitalization and will be discharged home in stable and improved condition with outpatient follow-up with general surgery 2 weeks postdischarge.     Symptomatic anemia- (present on admission) - Patient presented with generalized fatigue, lightheadedness, shortness of breath, pallor. -Patient with complaints of ongoing rectal bleeding feels likely from hemorrhoids early on during the hospitalization. -Hemoglobin of 5.4 on presentation, from 7.4(05/27/2021).  Hemoglobin noted at 12.6 (01/30/2021). -Status post transfusion 3 units packed red blood cells with hemoglobin stabilizing at 8.3 by day of discharge. -Patient with improvement with bloody bowel movements post hemorrhoidectomy (06/15/2021)/ -Anemia panel consistent with severe iron deficiency anemia. -Patient received IV Feraheme x1 during the hospitalization.  -See above GI bleed.   -Outpatient follow-up with PCP.    Rectal bleeding- (present on admission) - See above GI bleed/symptomatic anemia.   Bleeding internal hemorrhoids - See GI bleed above.   Hypomagnesemia- (present on admission) - Magnesium at 1.2 on presentation.. -Phosphorus at 4.2. -Magnesium was repleted during the hospitalization.  -Outpatient follow-up with PCP.    Major depressive disorder, recurrent, in remission ([place])- (present on admission) -Patient maintained on home regimen Lexapro.   Hyperlipidemia associated with type 2 diabetes mellitus ([place])- (present on admission) - Crestor.   Uncontrolled type 2 diabetes mellitus with hyperglycemia ([place])- (present on admission) - Well-controlled type 2 diabetes mellitus -Hemoglobin A1c 5.2 (06/13/2021). -Patient's oral hypoglycemic agents were held during the hospitalization and patient maintained on sliding scale insulin.  -Oral hypoglycemic agents will be resumed on discharge.     Severe anemia- (present on admission) - See symptomatic anemia.   Hyponatremia-  (present on admission) - Patient with a chronic hyponatremia -Outpatient follow-up.    Dehydration- (present on admission) - Patient hydrated with IV fluids.  -Status post transfusion 3 units packed red blood cells.     Anxiety disorder- (present on admission) -Patient maintained on home regimen Lexapro, Xanax as needed.   Essential hypertension- (present on admission) - Blood pressure borderline on 06/14/2021.   -Atenolol discontinued.   -BP improved.   -Patient maintained on home regimen propranolol.   -Patient's atenolol will be discontinued on discharge.  -Outpatient follow-up with PCP   Seizure disorder ([place]) - Patient with no seizures noted. -Patient was maintained on home regimen of antiseizure medications.   Chronic hyponatremia- (present on admission) - Stable.    [facility] Acute Care Issue to be followed in the Clinic   Principal Problem:   GI bleed Active Problems:   Rectal bleeding   Symptomatic anemia  Chronic hyponatremia   Seizure disorder (HCC)   Essential hypertension   Anxiety disorder   Dehydration   Hyponatremia   Severe anemia   Uncontrolled type 2 diabetes mellitus with hyperglycemia ([place])   Hyperlipidemia associated with type 2 diabetes mellitus ([place])   Major depressive disorder, recurrent, in remission ([place])   Hypomagnesemia   Bleeding internal hemorrhoids   Subjective:   Lewis Moccasin today has, No headache, No chest pain, No abdominal pain - No Nausea, No new weakness tingling or numbness, No Cough - SOB.  Patient states that she feels like she is 66 years old and could run circles around the house.  She denies having any residual symptoms or issues.  Patient denies any headache, dizziness, lightheadedness, fatigue and states that the rectal bleeding has stopped.  Assessment & Plan   1. Symptomatic anemia The cause of the bleeding was found and resolved.  Patient did receive blood transfusion in the hospital.  Repeat CBC ordered to  assess for stability of her hemoglobin and hematocrit. - CBC with Differential/Platelet  2. Chronic hyponatremia Patient does have chronic low sodium level due to medications, metabolic panel ordered to assess sodium level as well as verified that all of her labs have returned to baseline since being discharged from hospital.  - CMP14+EGFR  3. Hypomagnesemia Low magnesium is a common side effect of hospitalization.  Repeat magnesium level to ensure stabilization. - Magnesium  4. Rectal bleeding Patient had hemorrhoidectomy in the hospital, problem is resolved at this time    Reason for frequent admissions/ER visits   Hyponatremia Hemorrhoids, rectal bleeding Symptomatic anemia Diabetes Dehydration      Objective:   Vitals:   06/26/21 1150  BP: 105/65  Pulse: 75  Resp: 16  Temp: 98.2 F (36.8 C)  SpO2: 98%  Weight: 178 lb 12.8 oz (81.1 kg)  Height: _0  (1.575 m)    Wt Readings from Last 3 Encounters:  06/26/21 178 lb 12.8 oz (81.1 kg)  06/17/21 178 lb 6.4 oz (80.9 kg)  06/13/21 178 lb 3.2 oz (80.8 kg)    Allergies as of 06/26/2021       Reactions   Morphine And Related Other (See Comments)   Blacked out once        Medication List        Accurate as of June 26, 2021 11:59 PM. If you have any questions, ask your nurse or doctor.          Accu-Chek Guide test strip Generic drug: glucose blood Use as directed twice a strips twice a day E 11.65   Accu-Chek Softclix Lancets lancets Use as instructed twice a day Dx E11.65   acetaminophen 500 MG tablet Commonly known as: TYLENOL Take 2 tablets (1,000 mg total) by mouth every 6 (six) hours as needed for mild pain or fever.   ALPRAZolam 1 MG tablet Commonly known as: XANAX Take 1 tablet (1 mg total) by mouth 3 (three) times daily as needed for anxiety.   aspirin 81 MG EC tablet Commonly known as: Aspirin Low Dose Take 1 tablet (81 mg total) by mouth every morning. Swallow whole.   atenolol 50  MG tablet Commonly known as: TENORMIN TAKE 1 TABLET BY MOUTH TWICE A DAY   augmented betamethasone dipropionate 0.05 % ointment Commonly known as: Diprolene Apply topically 2 (two) times daily.   cetirizine 10 MG tablet Commonly known as: ZYRTEC TAKE 1 TABLET BY MOUTH TWICE A DAY FOR RASH. IF RASH AND ITCH  STILL BOTHERSOME AND NO SIDE EFFECTS (DRY MOUTH, BLURRY VISION), INCREASE TO 2 TABS TWICE A DAY.   cyclobenzaprine 10 MG tablet Commonly known as: FLEXERIL Take 1 tablet (10 mg total) by mouth 3 (three) times daily as needed for muscle spasms.   Dexcom G6 Receiver Devi Use as directed DX ell.65   Dexcom G6 Sensor Misc Use every 10 days dx e11.65   Dexcom G6 Transmitter Misc Use as directed   escitalopram 20 MG tablet Commonly known as: LEXAPRO Take 20 mg by mouth daily.   famotidine 20 MG tablet Commonly known as: PEPCID TAKE 1 TABLET BY MOUTH TWICE A DAY   ferrous sulfate 325 (65 FE) MG tablet Take 1 tablet (325 mg total) by mouth 2 (two) times daily.   gabapentin 100 MG capsule Commonly known as: NEURONTIN TAKE ONE CAPSULE BY MOUTH TWICE A DAY   glimepiride 2 MG tablet Commonly known as: AMARYL Take 1 tablet (2 mg total) by mouth daily before breakfast.   hydrocortisone 2.5 % rectal cream Commonly known as: ANUSOL-HC Place rectally 3 (three) times daily for 10 days.   hydrOXYzine 25 MG tablet Commonly known as: ATARAX Take 25 mg by mouth 3 (three) times daily as needed.   ibuprofen 600 MG tablet Commonly known as: ADVIL Take 1 tablet (600 mg total) by mouth every 8 (eight) hours as needed for moderate pain.   Lacosamide 150 MG Tabs Take 150 mg by mouth 2 (two) times daily.   levETIRAcetam 500 MG tablet Commonly known as: KEPPRA Take 500 mg by mouth 2 (two) times daily.   lisinopril 5 MG tablet Commonly known as: ZESTRIL TAKE 1 TABLET BY MOUTH DAILY   metFORMIN 500 MG tablet Commonly known as: GLUCOPHAGE TAKE 1 TABLET BY MOUTH TWICE A DAY WITH A  MEAL   montelukast 10 MG tablet Commonly known as: SINGULAIR TAKE 1 TABLET BY MOUTH AT BEDTIME   oxyCODONE 5 MG immediate release tablet Commonly known as: Oxy IR/ROXICODONE Take 1 tablet (5 mg total) by mouth every 6 (six) hours as needed for severe pain.   pantoprazole 40 MG tablet Commonly known as: PROTONIX Take 1 tablet (40 mg total) by mouth 2 (two) times daily.   polyethylene glycol 17 g packet Commonly known as: MIRALAX / GLYCOLAX Take 17 g by mouth daily.   propranolol 10 MG tablet Commonly known as: INDERAL Take 10 mg by mouth 3 (three) times daily.   rosuvastatin 5 MG tablet Commonly known as: CRESTOR TAKE 1 TABLET BY MOUTH DAILY   traZODone 150 MG tablet Commonly known as: DESYREL Take 150 mg by mouth at bedtime.         Physical Exam: Constitutional: Patient appears well-developed and well-nourished. Not in obvious distress. HENT: Normocephalic, atraumatic, External right and left ear normal. Oropharynx is clear and moist.  Eyes: Conjunctivae and EOM are normal. PERRLA, no scleral icterus. Neck: Normal ROM. Neck supple. No JVD. No tracheal deviation. No thyromegaly. CVS: RRR, S1/S2 +, no murmurs, no gallops, no carotid bruit.  Pulmonary: Effort and breath sounds normal, no stridor, rhonchi, wheezes, rales.  Abdominal: Soft. BS +, no distension, tenderness, rebound or guarding.  Musculoskeletal: Normal range of motion. No edema and no tenderness.  Lymphadenopathy: No lymphadenopathy noted, cervical, inguinal or axillary Neuro: Alert. Normal reflexes, muscle tone coordination. No cranial nerve deficit. Skin: Skin is warm and dry. No rash noted. Not diaphoretic. No erythema. No pallor. Psychiatric: Normal mood and affect. Behavior, judgment, thought content normal.   Data  Review   Micro Results No results found for this or any previous visit (from the past 240 hour(s)).   CBC No results for input(s): WBC, HGB, HCT, PLT, MCV, MCH, MCHC, RDW,  LYMPHSABS, MONOABS, EOSABS, BASOSABS, BANDABS in the last 168 hours.  Invalid input(s): NEUTRABS, BANDSABD  Chemistries  No results for input(s): NA, K, CL, CO2, GLUCOSE, BUN, CREATININE, CALCIUM, MG, AST, ALT, ALKPHOS, BILITOT in the last 168 hours.  Invalid input(s): GFRCGP ------------------------------------------------------------------------------------------------------------------ estimated creatinine clearance is 58.4 mL/min (by C-G formula based on SCr of 0.96 mg/dL). ------------------------------------------------------------------------------------------------------------------ No results for input(s): HGBA1C in the last 72 hours. ------------------------------------------------------------------------------------------------------------------ No results for input(s): CHOL, HDL, LDLCALC, TRIG, CHOLHDL, LDLDIRECT in the last 72 hours. ------------------------------------------------------------------------------------------------------------------ No results for input(s): TSH, T4TOTAL, T3FREE, THYROIDAB in the last 72 hours.  Invalid input(s): FREET3 ------------------------------------------------------------------------------------------------------------------ No results for input(s): VITAMINB12, FOLATE, FERRITIN, TIBC, IRON, RETICCTPCT in the last 72 hours.  Coagulation profile No results for input(s): INR, PROTIME in the last 168 hours.  No results for input(s): DDIMER in the last 72 hours.  Cardiac Enzymes No results for input(s): CKMB, TROPONINI, MYOGLOBIN in the last 168 hours.  Invalid input(s): CK ------------------------------------------------------------------------------------------------------------------ Invalid input(s): POCBNP  Return in 9 weeks (on 08/28/2021) for previously scheduled, F/U, Jevon Shells PCP.   Time Spent in minutes  45 Time spent with patient included reviewing progress notes, labs, imaging studies, and discussing plan for follow up.    This patient was seen by Jonetta Osgood, FNP-C in collaboration with Dr. Clayborn Bigness as a part of collaborative care agreement.   Jonetta Osgood MSN, FNP-C on 07/07/2021 at 9:55 AM Internal medicine  **Disclaimer: This note may have been dictated with voice recognition software. Similar sounding words can inadvertently be transcribed and this note may contain transcription errors which may not have been corrected upon publication of note.**

## 2021-07-02 ENCOUNTER — Encounter: Payer: Medicare Other | Admitting: Surgery

## 2021-07-02 ENCOUNTER — Telehealth: Payer: Self-pay

## 2021-07-02 ENCOUNTER — Other Ambulatory Visit: Payer: Self-pay | Admitting: Dermatology

## 2021-07-03 NOTE — Telephone Encounter (Signed)
errror

## 2021-07-04 ENCOUNTER — Encounter: Payer: Medicare Other | Admitting: Surgery

## 2021-07-07 ENCOUNTER — Encounter: Payer: Self-pay | Admitting: Nurse Practitioner

## 2021-07-15 ENCOUNTER — Other Ambulatory Visit: Payer: Self-pay | Admitting: Nurse Practitioner

## 2021-07-15 DIAGNOSIS — G8929 Other chronic pain: Secondary | ICD-10-CM

## 2021-07-16 ENCOUNTER — Other Ambulatory Visit: Payer: Self-pay | Admitting: Internal Medicine

## 2021-07-16 DIAGNOSIS — E1169 Type 2 diabetes mellitus with other specified complication: Secondary | ICD-10-CM

## 2021-07-16 DIAGNOSIS — I10 Essential (primary) hypertension: Secondary | ICD-10-CM

## 2021-08-06 ENCOUNTER — Other Ambulatory Visit: Payer: Self-pay | Admitting: Dermatology

## 2021-08-06 ENCOUNTER — Other Ambulatory Visit: Payer: Self-pay | Admitting: Internal Medicine

## 2021-08-06 ENCOUNTER — Other Ambulatory Visit: Payer: Self-pay | Admitting: Nurse Practitioner

## 2021-08-06 DIAGNOSIS — E1165 Type 2 diabetes mellitus with hyperglycemia: Secondary | ICD-10-CM

## 2021-08-06 DIAGNOSIS — M792 Neuralgia and neuritis, unspecified: Secondary | ICD-10-CM

## 2021-08-21 ENCOUNTER — Telehealth: Payer: Self-pay

## 2021-08-21 NOTE — Telephone Encounter (Signed)
Left vm and sent mychart message to confirm 08/28/21 appointment-Toni ?

## 2021-08-28 ENCOUNTER — Ambulatory Visit: Payer: Medicare Other | Admitting: Nurse Practitioner

## 2021-09-09 ENCOUNTER — Other Ambulatory Visit: Payer: Self-pay | Admitting: Nurse Practitioner

## 2021-09-09 ENCOUNTER — Other Ambulatory Visit: Payer: Self-pay | Admitting: Dermatology

## 2021-09-09 DIAGNOSIS — E1165 Type 2 diabetes mellitus with hyperglycemia: Secondary | ICD-10-CM

## 2021-09-16 ENCOUNTER — Other Ambulatory Visit: Payer: Self-pay | Admitting: Nurse Practitioner

## 2021-09-16 DIAGNOSIS — E1165 Type 2 diabetes mellitus with hyperglycemia: Secondary | ICD-10-CM

## 2021-10-22 ENCOUNTER — Emergency Department
Admission: EM | Admit: 2021-10-22 | Discharge: 2021-10-23 | Disposition: A | Discharge status: Home / Self Care | Payer: Medicare Other | Attending: Emergency Medicine | Admitting: Emergency Medicine

## 2021-10-22 ENCOUNTER — Other Ambulatory Visit: Payer: Self-pay

## 2021-10-22 ENCOUNTER — Emergency Department: Payer: Medicare Other

## 2021-10-22 DIAGNOSIS — S0083XA Contusion of other part of head, initial encounter: Secondary | ICD-10-CM | POA: Diagnosis not present

## 2021-10-22 DIAGNOSIS — F329 Major depressive disorder, single episode, unspecified: Secondary | ICD-10-CM | POA: Insufficient documentation

## 2021-10-22 DIAGNOSIS — F4310 Post-traumatic stress disorder, unspecified: Secondary | ICD-10-CM | POA: Insufficient documentation

## 2021-10-22 DIAGNOSIS — F10920 Alcohol use, unspecified with intoxication, uncomplicated: Secondary | ICD-10-CM

## 2021-10-22 DIAGNOSIS — E1165 Type 2 diabetes mellitus with hyperglycemia: Secondary | ICD-10-CM | POA: Insufficient documentation

## 2021-10-22 DIAGNOSIS — Z85828 Personal history of other malignant neoplasm of skin: Secondary | ICD-10-CM | POA: Diagnosis not present

## 2021-10-22 DIAGNOSIS — F10220 Alcohol dependence with intoxication, uncomplicated: Secondary | ICD-10-CM | POA: Diagnosis not present

## 2021-10-22 DIAGNOSIS — Y907 Blood alcohol level of 200-239 mg/100 ml: Secondary | ICD-10-CM | POA: Diagnosis not present

## 2021-10-22 DIAGNOSIS — Z79899 Other long term (current) drug therapy: Secondary | ICD-10-CM | POA: Diagnosis not present

## 2021-10-22 DIAGNOSIS — W1809XA Striking against other object with subsequent fall, initial encounter: Secondary | ICD-10-CM | POA: Diagnosis not present

## 2021-10-22 DIAGNOSIS — W19XXXA Unspecified fall, initial encounter: Secondary | ICD-10-CM

## 2021-10-22 DIAGNOSIS — S0990XA Unspecified injury of head, initial encounter: Secondary | ICD-10-CM | POA: Diagnosis present

## 2021-10-22 DIAGNOSIS — I10 Essential (primary) hypertension: Secondary | ICD-10-CM | POA: Insufficient documentation

## 2021-10-22 DIAGNOSIS — F419 Anxiety disorder, unspecified: Secondary | ICD-10-CM | POA: Insufficient documentation

## 2021-10-22 LAB — CBC
HCT: 33.4 % — ABNORMAL LOW (ref 36.0–46.0)
Hemoglobin: 11.3 g/dL — ABNORMAL LOW (ref 12.0–15.0)
MCH: 27.8 pg (ref 26.0–34.0)
MCHC: 33.8 g/dL (ref 30.0–36.0)
MCV: 82.1 fL (ref 80.0–100.0)
Platelets: 156 10*3/uL (ref 150–400)
RBC: 4.07 MIL/uL (ref 3.87–5.11)
RDW: 12.4 % (ref 11.5–15.5)
WBC: 7.4 10*3/uL (ref 4.0–10.5)
nRBC: 0 % (ref 0.0–0.2)

## 2021-10-22 LAB — URINE DRUG SCREEN, QUALITATIVE (ARMC ONLY)
Amphetamines, Ur Screen: NOT DETECTED
Barbiturates, Ur Screen: NOT DETECTED
Benzodiazepine, Ur Scrn: POSITIVE — AB
Cannabinoid 50 Ng, Ur ~~LOC~~: NOT DETECTED
Cocaine Metabolite,Ur ~~LOC~~: NOT DETECTED
MDMA (Ecstasy)Ur Screen: NOT DETECTED
Methadone Scn, Ur: NOT DETECTED
Opiate, Ur Screen: NOT DETECTED
Phencyclidine (PCP) Ur S: NOT DETECTED
Tricyclic, Ur Screen: NOT DETECTED

## 2021-10-22 LAB — COMPREHENSIVE METABOLIC PANEL
ALT: 10 U/L (ref 0–44)
AST: 12 U/L — ABNORMAL LOW (ref 15–41)
Albumin: 4.2 g/dL (ref 3.5–5.0)
Alkaline Phosphatase: 60 U/L (ref 38–126)
Anion gap: 8 (ref 5–15)
BUN: 6 mg/dL — ABNORMAL LOW (ref 8–23)
CO2: 27 mmol/L (ref 22–32)
Calcium: 9 mg/dL (ref 8.9–10.3)
Chloride: 95 mmol/L — ABNORMAL LOW (ref 98–111)
Creatinine, Ser: 0.66 mg/dL (ref 0.44–1.00)
GFR, Estimated: >60 mL/min (ref >60)
Glucose, Bld: 80 mg/dL (ref 70–99)
Potassium: 3.6 mmol/L (ref 3.5–5.1)
Sodium: 130 mmol/L — ABNORMAL LOW (ref 135–145)
Total Bilirubin: 0.5 mg/dL (ref 0.3–1.2)
Total Protein: 6.8 g/dL (ref 6.5–8.1)

## 2021-10-22 LAB — ETHANOL: Alcohol, Ethyl (B): 211 mg/dL — ABNORMAL HIGH (ref <10)

## 2021-10-22 MED ORDER — PANTOPRAZOLE SODIUM 40 MG PO TBEC
40.0000 mg | DELAYED_RELEASE_TABLET | Freq: Two times a day (BID) | ORAL | Status: DC
  Administered 2021-10-23: 40 mg via ORAL
  Filled 2021-10-22: qty 1

## 2021-10-22 MED ORDER — LEVETIRACETAM 500 MG PO TABS
500.0000 mg | ORAL_TABLET | Freq: Two times a day (BID) | ORAL | Status: DC
  Administered 2021-10-23: 500 mg via ORAL
  Filled 2021-10-22: qty 1

## 2021-10-22 MED ORDER — THIAMINE HCL 100 MG PO TABS: 100.0000 mg | ORAL_TABLET | Freq: Every day | ORAL | Status: DC

## 2021-10-22 MED ORDER — METFORMIN HCL 500 MG PO TABS: 500.0000 mg | ORAL_TABLET | Freq: Two times a day (BID) | ORAL | Status: DC

## 2021-10-22 MED ORDER — PROPRANOLOL HCL 20 MG PO TABS
10.0000 mg | ORAL_TABLET | Freq: Three times a day (TID) | ORAL | Status: DC
  Administered 2021-10-23: 10 mg via ORAL
  Filled 2021-10-22: qty 1

## 2021-10-22 MED ORDER — TRAZODONE HCL 50 MG PO TABS
150.0000 mg | ORAL_TABLET | Freq: Every day | ORAL | Status: DC
  Administered 2021-10-23: 150 mg via ORAL
  Filled 2021-10-22: qty 1

## 2021-10-22 MED ORDER — LACOSAMIDE 50 MG PO TABS
150.0000 mg | ORAL_TABLET | Freq: Two times a day (BID) | ORAL | Status: DC
  Administered 2021-10-23: 150 mg via ORAL
  Filled 2021-10-22: qty 3

## 2021-10-22 MED ORDER — LORAZEPAM 2 MG PO TABS
0.0000 mg | ORAL_TABLET | Freq: Four times a day (QID) | ORAL | Status: DC
  Administered 2021-10-23: 1 mg via ORAL
  Filled 2021-10-22: qty 1

## 2021-10-22 MED ORDER — GABAPENTIN 100 MG PO CAPS
100.0000 mg | ORAL_CAPSULE | Freq: Two times a day (BID) | ORAL | Status: DC
  Administered 2021-10-23: 100 mg via ORAL
  Filled 2021-10-22: qty 1

## 2021-10-22 MED ORDER — ATENOLOL 25 MG PO TABS
50.0000 mg | ORAL_TABLET | Freq: Two times a day (BID) | ORAL | Status: DC
  Administered 2021-10-23: 50 mg via ORAL
  Filled 2021-10-22: qty 2

## 2021-10-22 NOTE — ED Notes (Signed)
Patient requesting something to sleep. EDP made aware.

## 2021-10-22 NOTE — ED Triage Notes (Signed)
Pt arrives via ems from home, pt wears a life alert bracelet on her left wrist, pt reports that she has been drinking today and has fallen twice, pt states that she hit the back right side of her head and also the right side of her face, pt has bruising and swelling to the left cheek bone and states that she has a swollen area to the back left side of her head. Pt denies loc, pt reports that she is very depressed and wants help with her depression and her injuries, denies suicidal thoughts, states that she does live home alone

## 2021-10-22 NOTE — ED Triage Notes (Signed)
Patient arrived by EMS from home for Fall. Hematoma to back of head. Denies taking blood thinners. Patient reports she has had 5-6 beers today and feeling depressed. Denies pain. EMS glucose 70. EMS administered '15mg'$  oral glucose and brought glucose up to 85. EMS vitals b/p 154/92, 99% RA, 74HR.  HX diabetes and seizures

## 2021-10-22 NOTE — ED Notes (Signed)
Pt. Alert and oriented, warm and dry, in no distress. Pt. Denies SI, HI, and AVH. Patient states she had drank 5 beers today cause she was depressed. Patient states she used to be an acholic but hasn't drank in over 7-8 months and today she drank some beer.

## 2021-10-22 NOTE — ED Notes (Signed)
Pt ambulated to bathroom with assistance.

## 2021-10-22 NOTE — ED Provider Notes (Addendum)
[facility] Provider Note    Event Date/Time   First MD Initiated Contact with Patient 10/22/21 1956     (approximate)   History   Alcohol Intoxication, Fall, Depression, Head Injury, and Facial Injury   HPI  Christina French is a 66 y.o. female with past medical history of anxiety depression hypertension GERD who presents after a fall.  Patient was drinking alcohol had a mechanical fall was not able to get up on her own.  She says she is depressed has been drinking more as a result but denies suicidal ideation.  She did hit her head denies visual change numbness tingling weakness denies other pain occluding pain in her extremities chest abdomen hips.     Past Medical History:  Diagnosis Date   Anemia    Anxiety    Arthritis    Barrett esophagus    Cancer ([place]) 2016    Skin cancer right leg mylenoma   Depression    Diabetes ([place]) 04/25/2017   GERD (gastroesophageal reflux disease)    History of blood transfusion    with a surgery   History of bronchitis    History of pneumonia    Hypertension    IBS (irritable bowel syndrome)    Pneumonia 09/27/15   PTSD (post-traumatic stress disorder)    Renal insufficiency    reports acute kidney injury   Seizures ([place])    rare - last one 07/30/14 - was very anemic.  Silent seziure -June 2016-    Vertigo    thinks related to sinus issues    Patient Active Problem List   Diagnosis Date Noted   Hypomagnesemia 06/14/2021   Bleeding internal hemorrhoids    Major depressive disorder, recurrent, in remission ([place]) 10/23/2020   Vertigo 10/12/2020   Atrial fibrillation, unspecified type ([place]) 10/11/2020   Hyperlipidemia associated with type 2 diabetes mellitus ([place]) 02/02/2020   Uncontrolled type 2 diabetes mellitus with hyperglycemia ([place]) 01/31/2020   Encounter for general adult medical examination with abnormal findings 06/16/2019   Neuralgia and neuritis, unspecified 06/16/2019   Central sleep apnea due to  medical condition 02/27/2019   Hematochezia 12/20/2018   Impaired fasting glucose 11/11/2017   Low back pain of thoracolumbar region with sciatica 11/11/2017   Severe anemia    GI bleed 04/25/2017   Symptomatic anemia 04/25/2017   Hyponatremia 04/25/2017   Diabetes ([place]) 04/25/2017   Rectal bleeding 08/03/2016   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   Anemia 04/16/2015   Anxiety disorder 03/20/2015   Acute hyperglycemia 03/20/2015   Pulse irregularity 03/20/2015   Dehydration 03/20/2015   Prosthetic hip infection ([place])    Staphylococcus aureus infection    Enteritis due to Clostridium difficile    Screening for breast cancer    Acute pain of right hip; hip infection 11/24/2014   Right hip pain 11/24/2014   Chronic hyponatremia 11/24/2014   Seizure disorder ([place]) 08/17/2013   Digestive disorder 08/17/2013   Essential hypertension 08/17/2013   Hyperlipidemia 08/17/2013   Seizure ([place]) 08/17/2013     Physical Exam  Triage Vital Signs: ED Triage Vitals [10/22/21 1846]  Enc Vitals Group     BP 138/84     Pulse Rate 71     Resp 16     Temp 97.8 F (36.6 C)     Temp Source Oral     SpO2 99 %     Weight 165 lb (74.8 kg)     Height '5\' 1"'$  (1.549  m)     Head Circumference      Peak Flow      Pain Score 0     Pain Loc      Pain Edu?      Excl. in [place]?     Most recent vital signs: Vitals:   10/22/21 1846  BP: 138/84  Pulse: 71  Resp: 16  Temp: 97.8 F (36.6 C)  SpO2: 99%     General: Awake, no distress.  CV:  Good peripheral perfusion.  Resp:  Normal effort.  Abd:  No distention.  Nontender throughout Neuro:             Awake, Alert, Oriented x 3  Other:  Patient is mildly dysarthric Pupils are dilated, extraocular movements are intact Mild ecchymosis over the left cheek no midface instability No chest wall tenderness Pelvis is stable nontender able to range bilateral lower extremities without difficulty   ED Results / Procedures / Treatments  Labs (all  labs ordered are listed, but only abnormal results are displayed) Labs Reviewed  COMPREHENSIVE METABOLIC PANEL - Abnormal; Notable for the following components:      Result Value   Sodium 130 (*)    Chloride 95 (*)    BUN 6 (*)    AST 12 (*)    All other components within normal limits  ETHANOL - Abnormal; Notable for the following components:   Alcohol, Ethyl (B) 211 (*)    All other components within normal limits  CBC - Abnormal; Notable for the following components:   Hemoglobin 11.3 (*)    HCT 33.4 (*)    All other components within normal limits  URINE DRUG SCREEN, QUALITATIVE ([facility] ONLY) - Abnormal; Notable for the following components:   Benzodiazepine, Ur Scrn POSITIVE (*)    All other components within normal limits     EKG     RADIOLOGY I reviewed and interpreted the CT scan of the brain which does not show any acute intracranial process    PROCEDURES:  Critical Care performed: No  Procedures  The patient is on the cardiac monitor to evaluate for evidence of arrhythmia and/or significant heart rate changes.   MEDICATIONS ORDERED IN ED: Medications - No data to display   IMPRESSION / MDM / [place] / ED COURSE  I reviewed the triage vital signs and the nursing notes.                              Patient's presentation is most consistent with acute presentation with potential threat to life or bodily function.  Differential diagnosis includes, but is not limited to, intracranial hemorrhage, cervical spine fracture, concussion mechanical fall, intoxication  The patient is a 66 year old female who presents w after a fall while intoxicated with alcohol.  She is feeling depressed but not suicidal has been using alcohol as a way to self medicate.  She did hit her head and has some ecchymosis over the left cheek no signs of entrapment and there is no midface instability no other signs of trauma to the head and neck and no tenderness elsewhere on her  body.  CT head and C-spine are negative for acute injury.  Will for an ethanol level of 211, CMP and CBC are unremarkable UDS positive for benzos.  She does take Xanax as needed for anxiety.  Plan to observe until patient can safely be discharged.  She does not have  a ride home but will be able to take a taxi when she is sober.  No indication for urgent psych evaluation she has a therapist.  Patient signed out to oncoming provider pending observation until clinically sober enough to be discharged.       FINAL CLINICAL IMPRESSION(S) / ED DIAGNOSES   Final diagnoses:  Fall, initial encounter  Alcoholic intoxication without complication ([place])     Rx / DC Orders   ED Discharge Orders     None        Note:  This document was prepared using Dragon voice recognition software and may include unintentional dictation errors.   Rada Hay, MD 10/22/21 2156    Rada Hay, MD 10/22/21 2329

## 2021-10-22 NOTE — ED Notes (Signed)
Pt refused snack.  Demanding Xanax.  Pt was told she will get her night meds

## 2021-10-22 NOTE — ED Notes (Signed)
Patient assisted to restroom by this Probation officer with patient using a cane.

## 2021-10-22 NOTE — ED Notes (Signed)
Pt up to bathroom with assistance 

## 2021-10-22 NOTE — ED Notes (Signed)
This Probation officer assisted patient to restroom.

## 2021-10-23 DIAGNOSIS — S0083XA Contusion of other part of head, initial encounter: Secondary | ICD-10-CM | POA: Diagnosis not present

## 2021-10-23 NOTE — ED Provider Notes (Signed)
-----------------------------------------   6:08 AM on 10/23/2021 -----------------------------------------   Patient is awake, alert and ambulatory with steady gait using her cane.  Her daughter texted and will be able to pick her up.  Strict return precautions given.  Patient verbalizes understanding and agrees with plan of care.   Paulette Blanch, MD 10/23/21 [phone]

## 2021-10-23 NOTE — ED Notes (Signed)
VOL/ETOH ABUSE

## 2021-10-23 NOTE — ED Notes (Signed)
ED Provider at bedside. 

## 2021-10-23 NOTE — Discharge Instructions (Signed)
Drink alcohol only in moderation.  Apply ice to affected area several times daily to reduce swelling.  Return to the ER for worsening symptoms, persistent vomiting, lethargy or other concerns.

## 2021-12-15 ENCOUNTER — Other Ambulatory Visit: Payer: Self-pay | Admitting: Nurse Practitioner

## 2021-12-15 DIAGNOSIS — K219 Gastro-esophageal reflux disease without esophagitis: Secondary | ICD-10-CM

## 2021-12-15 NOTE — Telephone Encounter (Signed)
Multiple cancelled app // no shows, please discuss

## 2021-12-16 NOTE — Telephone Encounter (Signed)
Should I discharge, or schedule another appointment with warning?

## 2022-01-08 ENCOUNTER — Other Ambulatory Visit: Payer: Self-pay | Admitting: Nurse Practitioner

## 2022-01-08 DIAGNOSIS — I10 Essential (primary) hypertension: Secondary | ICD-10-CM

## 2022-01-08 DIAGNOSIS — E785 Hyperlipidemia, unspecified: Secondary | ICD-10-CM

## 2022-01-17 ENCOUNTER — Other Ambulatory Visit: Payer: Self-pay | Admitting: Nurse Practitioner

## 2022-01-17 DIAGNOSIS — E1165 Type 2 diabetes mellitus with hyperglycemia: Secondary | ICD-10-CM

## 2022-02-12 NOTE — Telephone Encounter (Signed)
Daughter stated we are no longer her pcp. She is going elsewhere.

## 2022-04-23 ENCOUNTER — Emergency Department: Payer: Medicare Other

## 2022-04-23 ENCOUNTER — Other Ambulatory Visit: Payer: Self-pay

## 2022-04-23 ENCOUNTER — Encounter: Payer: Self-pay | Admitting: Emergency Medicine

## 2022-04-23 DIAGNOSIS — Z7984 Long term (current) use of oral hypoglycemic drugs: Secondary | ICD-10-CM | POA: Insufficient documentation

## 2022-04-23 DIAGNOSIS — E119 Type 2 diabetes mellitus without complications: Secondary | ICD-10-CM | POA: Diagnosis not present

## 2022-04-23 DIAGNOSIS — G20A1 Parkinson's disease without dyskinesia, without mention of fluctuations: Secondary | ICD-10-CM | POA: Diagnosis not present

## 2022-04-23 DIAGNOSIS — Z79899 Other long term (current) drug therapy: Secondary | ICD-10-CM | POA: Insufficient documentation

## 2022-04-23 DIAGNOSIS — Z85828 Personal history of other malignant neoplasm of skin: Secondary | ICD-10-CM | POA: Insufficient documentation

## 2022-04-23 DIAGNOSIS — Z96641 Presence of right artificial hip joint: Secondary | ICD-10-CM | POA: Insufficient documentation

## 2022-04-23 DIAGNOSIS — I10 Essential (primary) hypertension: Secondary | ICD-10-CM | POA: Diagnosis not present

## 2022-04-23 DIAGNOSIS — Z7982 Long term (current) use of aspirin: Secondary | ICD-10-CM | POA: Diagnosis not present

## 2022-04-23 DIAGNOSIS — R519 Headache, unspecified: Secondary | ICD-10-CM | POA: Diagnosis present

## 2022-04-23 DIAGNOSIS — U071 COVID-19: Secondary | ICD-10-CM | POA: Diagnosis not present

## 2022-04-23 LAB — CBC
HCT: 35.7 % — ABNORMAL LOW (ref 36.0–46.0)
Hemoglobin: 12.5 g/dL (ref 12.0–15.0)
MCH: 28.3 pg (ref 26.0–34.0)
MCHC: 35 g/dL (ref 30.0–36.0)
MCV: 81 fL (ref 80.0–100.0)
Platelets: 176 10*3/uL (ref 150–400)
RBC: 4.41 MIL/uL (ref 3.87–5.11)
RDW: 12.4 % (ref 11.5–15.5)
WBC: 5.3 10*3/uL (ref 4.0–10.5)
nRBC: 0 % (ref 0.0–0.2)

## 2022-04-23 LAB — COMPREHENSIVE METABOLIC PANEL
ALT: 12 U/L (ref 0–44)
AST: 17 U/L (ref 15–41)
Albumin: 4.1 g/dL (ref 3.5–5.0)
Alkaline Phosphatase: 55 U/L (ref 38–126)
Anion gap: 12 (ref 5–15)
BUN: 9 mg/dL (ref 8–23)
CO2: 23 mmol/L (ref 22–32)
Calcium: 9.1 mg/dL (ref 8.9–10.3)
Chloride: 100 mmol/L (ref 98–111)
Creatinine, Ser: 1.13 mg/dL — ABNORMAL HIGH (ref 0.44–1.00)
GFR, Estimated: 54 mL/min — ABNORMAL LOW (ref >60)
Glucose, Bld: 91 mg/dL (ref 70–99)
Potassium: 3.2 mmol/L — ABNORMAL LOW (ref 3.5–5.1)
Sodium: 135 mmol/L (ref 135–145)
Total Bilirubin: 1.2 mg/dL (ref 0.3–1.2)
Total Protein: 7.4 g/dL (ref 6.5–8.1)

## 2022-04-23 LAB — DIFFERENTIAL
Abs Immature Granulocytes: 0.06 10*3/uL (ref 0.00–0.07)
Basophils Absolute: 0 10*3/uL (ref 0.0–0.1)
Basophils Relative: 1 %
Eosinophils Absolute: 0.1 10*3/uL (ref 0.0–0.5)
Eosinophils Relative: 1 %
Immature Granulocytes: 1 %
Lymphocytes Relative: 10 %
Lymphs Abs: 0.5 10*3/uL — ABNORMAL LOW (ref 0.7–4.0)
Monocytes Absolute: 1.1 10*3/uL — ABNORMAL HIGH (ref 0.1–1.0)
Monocytes Relative: 22 %
Neutro Abs: 3.4 10*3/uL (ref 1.7–7.7)
Neutrophils Relative %: 65 %

## 2022-04-23 LAB — TROPONIN I (HIGH SENSITIVITY): Troponin I (High Sensitivity): 18 ng/L — ABNORMAL HIGH (ref <18)

## 2022-04-23 LAB — RESP PANEL BY RT-PCR (RSV, FLU A&B, COVID)  RVPGX2
Influenza A by PCR: NEGATIVE
Influenza B by PCR: NEGATIVE
Resp Syncytial Virus by PCR: NEGATIVE
SARS Coronavirus 2 by RT PCR: POSITIVE — AB

## 2022-04-23 LAB — LIPASE, BLOOD: Lipase: 28 U/L (ref 11–51)

## 2022-04-23 NOTE — ED Triage Notes (Signed)
First Nurse Note:  ARrives from home via [facility]. C/O N/V since [id number]/107  CBG:  118 P: 86  20g LAC  4 mg Zofran given.  Exposed to [place]

## 2022-04-23 NOTE — ED Triage Notes (Signed)
Patient c/o N/V/D and dizziness x 2 days ago. Also c/o bad headache and cough x 1 week.

## 2022-04-23 NOTE — ED Provider Triage Note (Signed)
Emergency Medicine Provider Triage Evaluation Note  Christina French , a 66 y.o. female  was evaluated in triage.  Pt complains of dizziness, v today, diarrhea x 2 days.  Review of Systems  Positive:  Negative:   Physical Exam  There were no vitals taken for this visit. Gen:   Awake, no distress   Resp:  Normal effort  MSK:   Moves extremities without difficulty  Other:    Medical Decision Making  Medically screening exam initiated at 6:24 PM.  Appropriate orders placed.  Christina French was informed that the remainder of the evaluation will be completed by another provider, this initial triage assessment does not replace that evaluation, and the importance of remaining in the ED until their evaluation is complete.     Versie Starks, PA-C 04/23/22 1825

## 2022-04-24 ENCOUNTER — Emergency Department
Admission: EM | Admit: 2022-04-24 | Discharge: 2022-04-24 | Disposition: A | Discharge status: Home / Self Care | Payer: Medicare Other | Attending: Emergency Medicine | Admitting: Emergency Medicine

## 2022-04-24 DIAGNOSIS — U071 COVID-19: Secondary | ICD-10-CM

## 2022-04-24 LAB — TROPONIN I (HIGH SENSITIVITY): Troponin I (High Sensitivity): 14 ng/L (ref <18)

## 2022-04-24 LAB — URINALYSIS, ROUTINE W REFLEX MICROSCOPIC
Bilirubin Urine: NEGATIVE
Glucose, UA: NEGATIVE mg/dL
Ketones, ur: NEGATIVE mg/dL
Nitrite: NEGATIVE
Protein, ur: NEGATIVE mg/dL
Specific Gravity, Urine: 1.014 (ref 1.005–1.030)
pH: 5 (ref 5.0–8.0)

## 2022-04-24 MED ORDER — ACETAMINOPHEN 500 MG PO TABS
1000.0000 mg | ORAL_TABLET | Freq: Once | ORAL | Status: AC
  Administered 2022-04-24: 1000 mg via ORAL
  Filled 2022-04-24: qty 2

## 2022-04-24 MED ORDER — POTASSIUM CHLORIDE CRYS ER 20 MEQ PO TBCR
40.0000 meq | EXTENDED_RELEASE_TABLET | Freq: Once | ORAL | Status: AC
  Administered 2022-04-24: 40 meq via ORAL
  Filled 2022-04-24: qty 2

## 2022-04-24 MED ORDER — BENZONATATE 100 MG PO CAPS
100.0000 mg | ORAL_CAPSULE | Freq: Once | ORAL | Status: AC
  Administered 2022-04-24: 100 mg via ORAL
  Filled 2022-04-24: qty 1

## 2022-04-24 MED ORDER — LOPERAMIDE HCL 2 MG PO CAPS
2.0000 mg | ORAL_CAPSULE | Freq: Once | ORAL | Status: AC
  Administered 2022-04-24: 2 mg via ORAL
  Filled 2022-04-24: qty 1

## 2022-04-24 MED ORDER — ONDANSETRON 4 MG PO TBDP
4.0000 mg | ORAL_TABLET | Freq: Once | ORAL | Status: AC
  Administered 2022-04-24: 4 mg via ORAL
  Filled 2022-04-24: qty 1

## 2022-04-24 MED ORDER — BENZONATATE 100 MG PO CAPS: 100.0000 mg | ORAL_CAPSULE | Freq: Three times a day (TID) | ORAL | 0 refills | Status: AC | PRN

## 2022-04-24 NOTE — ED Provider Notes (Signed)
[facility] Provider Note    Event Date/Time   First MD Initiated Contact with Patient 04/24/22 0124     (approximate)   History   Dizziness   HPI  Christina French is a 66 y.o. female with history of Parkinson's, diabetes, hypertension who presents emergency department with 2 weeks of intermittent headache, nausea, diarrhea, chest soreness from coughing, shortness of breath and nonproductive cough.  Family members also sick.  No vomiting.  No fever.   History provided by patient.    Past Medical History:  Diagnosis Date   Anemia    Anxiety    Arthritis    Barrett esophagus    Cancer ([place]) 2016    Skin cancer right leg mylenoma   Depression    Diabetes ([place]) 04/25/2017   GERD (gastroesophageal reflux disease)    History of blood transfusion    with a surgery   History of bronchitis    History of pneumonia    Hypertension    IBS (irritable bowel syndrome)    Pneumonia 09/27/15   PTSD (post-traumatic stress disorder)    Renal insufficiency    reports acute kidney injury   Seizures ([place])    rare - last one 07/30/14 - was very anemic.  Silent seziure -June 2016-    Vertigo    thinks related to sinus issues    Past Surgical History:  Procedure Laterality Date   ABDOMINAL HYSTERECTOMY     CESAREAN SECTION     COLONOSCOPY N/A 08/31/2014   Procedure: COLONOSCOPY;  Surgeon: Lucilla Lame, MD;  Location: [place];  Service: Gastroenterology;  Laterality: N/A;   COLONOSCOPY N/A 08/05/2016   Procedure: COLONOSCOPY;  Surgeon: [name], MD;  Location: [facility] ENDOSCOPY;  Service: Endoscopy;  Laterality: N/A;   COLONOSCOPY WITH PROPOFOL N/A 12/22/2018   Procedure: COLONOSCOPY WITH PROPOFOL;  Surgeon: Virgel Manifold, MD;  Location: [facility] ENDOSCOPY;  Service: Endoscopy;  Laterality: N/A;   CYST EXCISION  04/27/2009   from throat   ESOPHAGOGASTRODUODENOSCOPY N/A 08/31/2014   Procedure: ESOPHAGOGASTRODUODENOSCOPY (EGD);  Surgeon: Lucilla Lame,  MD;  Location: [place];  Service: Gastroenterology;  Laterality: N/A;   ESOPHAGOGASTRODUODENOSCOPY N/A 12/21/2018   Procedure: ESOPHAGOGASTRODUODENOSCOPY (EGD);  Surgeon: Virgel Manifold, MD;  Location: [facility]COPY;  Service: Endoscopy;  Laterality: N/A;   ESOPHAGOGASTRODUODENOSCOPY (EGD) WITH PROPOFOL N/A 07/01/2017   Procedure: ESOPHAGOGASTRODUODENOSCOPY (EGD) WITH PROPOFOL;  Surgeon: [name], MD;  Location: [facility] ENDOSCOPY;  Service: Gastroenterology;  Laterality: N/A;   FLEXIBLE SIGMOIDOSCOPY N/A 07/01/2017   Procedure: FLEXIBLE SIGMOIDOSCOPY;  Surgeon: [name], MD;  Location: [facility] ENDOSCOPY;  Service: Gastroenterology;  Laterality: N/A;   GIVENS CAPSULE STUDY  12/22/2018   Procedure: GIVENS CAPSULE STUDY;  Surgeon: Virgel Manifold, MD;  Location: [place] ENDOSCOPY;  Service: Endoscopy;;   HEMORRHOID SURGERY     HEMORRHOID SURGERY N/A 06/15/2021   Procedure: HEMORRHOIDECTOMY;  Surgeon: Olean Ree, MD;  Location: [facility] ORS;  Service: General;  Laterality: N/A;   HEMORRHOID SURGERY  06/14/2020   INCISION AND DRAINAGE HIP Right 11/25/2014   Procedure: IRRIGATION  AND DRAINAGEOF RIGHT HIP WITH PLACEMENT OF ANTIBIOUTIC BEADS;  Surgeon: Mcarthur Rossetti, MD;  Location: [place];  Service: Orthopedics;  Laterality: Right;   INCISION AND DRAINAGE HIP Right 11/28/2014   Procedure: Repeat I&D Right Hip;  Surgeon: Mcarthur Rossetti, MD;  Location: [place];  Service: Orthopedics;  Laterality: Right;   JOINT REPLACEMENT Right 04/27/2005   hip   LOOP RECORDER INSERTION  N/A 04/04/2019   Procedure: LOOP RECORDER INSERTION;  Surgeon: Isaias Cowman, MD;  Location: [place] CV LAB;  Service: Cardiovascular;  Laterality: N/A;   TOTAL HIP REVISION Right 03/19/2015   Procedure: RIGHT TOTAL HIP ARTHROPLASTY REVISION;  Surgeon: Meredith Pel, MD;  Location: [place];  Service: Orthopedics;  Laterality: Right;   TOTAL HIP REVISION Right 11/18/2015   Procedure:  TOTAL HIP REVISION;  Surgeon: Frederik Pear, MD;  Location: [place];  Service: Orthopedics;  Laterality: Right;    MEDICATIONS:  Prior to Admission medications   Medication Sig Start Date End Date Taking? Authorizing Provider  Accu-Chek Softclix Lancets lancets Use as instructed twice a day Dx E11.65 01/11/20   Ronnell Freshwater, NP  acetaminophen (TYLENOL) 500 MG tablet Take 2 tablets (1,000 mg total) by mouth every 6 (six) hours as needed for mild pain or fever. 06/16/21   Olean Ree, MD  ALPRAZolam Duanne Moron) 1 MG tablet Take 1 tablet (1 mg total) by mouth 3 (three) times daily as needed for anxiety. 10/15/20   Lavina Hamman, MD  aspirin (ASPIRIN LOW DOSE) 81 MG EC tablet Take 1 tablet (81 mg total) by mouth every morning. Swallow whole. 06/20/21   Eugenie Filler, MD  atenolol (TENORMIN) 50 MG tablet TAKE 1 TABLET BY MOUTH TWICE A DAY 06/17/21   Jonetta Osgood, NP  augmented betamethasone dipropionate (DIPROLENE) 0.05 % ointment Apply topically 2 (two) times daily. Patient not taking: Reported on 10/22/2021 01/30/21   [name], MD  cetirizine (ZYRTEC) 10 MG tablet TAKE 1 TABLET BY MOUTH TWICE A DAY FOR RASH. IF RASH AND ITCH STILL BOTHERSOME AND NO SIDE EFFECTS (DRY MOUTH, BLURRY VISION), INCREASE TO 2 TABS TWICE A DAY. 04/22/21   Moye, [place], MD  Continuous Blood Gluc Receiver ([place]) DEVI Use as directed DX ell.65 01/30/21   [name], MD  Continuous Blood Gluc Sensor (DEXCOM G6 SENSOR) MISC Use every 10 days dx e11.65 01/30/21   [name], MD  Continuous Blood Gluc Transmit (DEXCOM G6 TRANSMITTER) MISC Use as directed 01/30/21   [name], MD  cyclobenzaprine (FLEXERIL) 10 MG tablet TAKE 1 TABLET BY MOUTH 3 TIMES DAILY AS NEEDED FOR MUSCLE SPASMS 07/15/21   Jonetta Osgood, NP  escitalopram (LEXAPRO) 20 MG tablet Take 20 mg by mouth daily.     [provider]  eszopiclone (LUNESTA) 2 MG TABS tablet Take 2 mg by mouth at bedtime as needed. Patient not  taking: Reported on 10/22/2021 10/01/21   [provider]  famotidine (PEPCID) 20 MG tablet TAKE 1 TABLET BY MOUTH TWICE A DAY 09/10/21   Moye, [place], MD  ferrous sulfate 325 (65 FE) MG tablet Take 1 tablet (325 mg total) by mouth 2 (two) times daily. 06/17/21   Eugenie Filler, MD  gabapentin (NEURONTIN) 100 MG capsule TAKE ONE CAPSULE BY MOUTH TWICE A DAY 08/06/21   Abernathy, Yetta Flock, NP  glimepiride (AMARYL) 2 MG tablet TAKE 1 TABLET BY MOUTH DAILY BEFORE BREAKFAST 08/06/21   Jonetta Osgood, NP  glucose blood (ACCU-CHEK GUIDE) test strip Use as directed twice a strips twice a day E 11.65 01/11/20   Ronnell Freshwater, NP  hydrOXYzine (ATARAX) 25 MG tablet Take 25 mg by mouth 3 (three) times daily as needed. 06/03/21   [provider]  ibuprofen (ADVIL) 600 MG tablet Take 1 tablet (600 mg total) by mouth every 8 (eight) hours as needed for moderate pain. 06/16/21   Piscoya,  Jose, MD  Lacosamide 150 MG TABS Take 150 mg by mouth 2 (two) times daily.     [provider]  levETIRAcetam (KEPPRA) 500 MG tablet Take 500 mg by mouth 2 (two) times daily.    [provider]  lisinopril (ZESTRIL) 5 MG tablet TAKE 1 TABLET BY MOUTH DAILY 07/16/21   Jonetta Osgood, NP  metFORMIN (GLUCOPHAGE) 500 MG tablet TAKE 1 TABLET BY MOUTH TWICE A DAY WITH A MEAL 05/21/21   Abernathy, Alyssa, NP  montelukast (SINGULAIR) 10 MG tablet TAKE 1 TABLET BY MOUTH AT BEDTIME 06/17/21   Abernathy, Yetta Flock, NP  oxyCODONE (OXY IR/ROXICODONE) 5 MG immediate release tablet Take 1 tablet (5 mg total) by mouth every 6 (six) hours as needed for severe pain. 06/16/21   Olean Ree, MD  pantoprazole (PROTONIX) 40 MG tablet Take 1 tablet (40 mg total) by mouth 2 (two) times daily. 06/13/21   Jonetta Osgood, NP  polyethylene glycol (MIRALAX / GLYCOLAX) 17 g packet Take 17 g by mouth daily. 06/18/21   Eugenie Filler, MD  propranolol (INDERAL) 10 MG tablet Take 10 mg by mouth 3 (three) times daily. 06/03/21    [provider]  rosuvastatin (CRESTOR) 5 MG tablet TAKE 1 TABLET BY MOUTH DAILY 07/16/21   Jonetta Osgood, NP  traZODone (DESYREL) 150 MG tablet Take 150 mg by mouth at bedtime. 06/03/21   [provider]    Physical Exam   Triage Vital Signs: ED Triage Vitals  Enc Vitals Group     BP 04/23/22 1823 (!) 134/90     Pulse Rate 04/23/22 1823 80     Resp 04/23/22 1823 18     Temp 04/23/22 1823 98.7 F (37.1 C)     Temp Source 04/23/22 1823 Oral     SpO2 04/23/22 1823 95 %     Weight 04/23/22 1825 165 lb (74.8 kg)     Height 04/23/22 1825 '5\' 1"'$  (1.549 m)     Head Circumference --      Peak Flow --      Pain Score 04/23/22 1825 8     Pain Loc --      Pain Edu? --      Excl. in [place]? --     Most recent vital signs: Vitals:   04/23/22 2326 04/24/22 0243  BP: 123/78   Pulse: 73 77  Resp: 18 20  Temp: 98 F (36.7 C) 98 F (36.7 C)  SpO2: 96% 98%    CONSTITUTIONAL: Alert and oriented and responds appropriately to questions.  Chronically ill-appearing but in no distress HEAD: Normocephalic, atraumatic EYES: Conjunctivae clear, pupils appear equal, sclera nonicteric ENT: normal nose; moist mucous membranes NECK: Supple, normal ROM CARD: RRR; S1 and S2 appreciated; no murmurs, no clicks, no rubs, no gallops RESP: Normal chest excursion without splinting or tachypnea; breath sounds clear and equal bilaterally; no wheezes, no rhonchi, no rales, no hypoxia or respiratory distress, speaking full sentences ABD/GI: Normal bowel sounds; non-distended; soft, non-tender, no rebound, no guarding, no peritoneal signs BACK: The back appears normal EXT: Normal ROM in all joints; no deformity noted, no edema; no cyanosis SKIN: Normal color for age and race; warm; no rash on exposed skin NEURO: Moves all extremities equally, normal speech, ambulates with steady gait using a cane with no hypoxia PSYCH: The patient's mood and manner are appropriate.   ED Results / Procedures /  Treatments   LABS: (all labs ordered are listed, but only abnormal results are displayed)  Labs Reviewed  RESP PANEL BY RT-PCR (RSV, FLU A&B, COVID)  RVPGX2 - Abnormal; Notable for the following components:      Result Value   SARS Coronavirus 2 by RT PCR POSITIVE (*)    All other components within normal limits  CBC - Abnormal; Notable for the following components:   HCT 35.7 (*)    All other components within normal limits  DIFFERENTIAL - Abnormal; Notable for the following components:   Lymphs Abs 0.5 (*)    Monocytes Absolute 1.1 (*)    All other components within normal limits  COMPREHENSIVE METABOLIC PANEL - Abnormal; Notable for the following components:   Potassium 3.2 (*)    Creatinine, Ser 1.13 (*)    GFR, Estimated 54 (*)    All other components within normal limits  URINALYSIS, ROUTINE W REFLEX MICROSCOPIC - Abnormal; Notable for the following components:   Color, Urine YELLOW (*)    APPearance HAZY (*)    Hgb urine dipstick SMALL (*)    Leukocytes,Ua TRACE (*)    Bacteria, UA RARE (*)    All other components within normal limits  TROPONIN I (HIGH SENSITIVITY) - Abnormal; Notable for the following components:   Troponin I (High Sensitivity) 18 (*)    All other components within normal limits  LIPASE, BLOOD  TROPONIN I (HIGH SENSITIVITY)     EKG:  EKG Interpretation  Date/Time:  Thursday April 23 2022 18:36:19 EST Ventricular Rate:  79 PR Interval:  172 QRS Duration: 76 QT Interval:  362 QTC Calculation: 415 R Axis:   9 Text Interpretation: Normal sinus rhythm Low voltage QRS No significant change since last tracing Confirmed by Pryor Curia [phone]) on 04/24/2022 2:34:55 AM         RADIOLOGY: My personal review and interpretation of imaging: Chest x-ray clear.  CT head shows no acute abnormality.  I have personally reviewed all radiology reports.   DG Chest 2 View  Result Date: 04/23/2022 CLINICAL DATA:  Cough.  Dizziness EXAM: CHEST - 2 VIEW  COMPARISON:  04/25/2017 FINDINGS: Stable cardiomediastinal silhouette. Loop recorder. No focal consolidation, pleural effusion, or pneumothorax. No acute osseous abnormality. IMPRESSION: No active cardiopulmonary disease. Electronically Signed   By: Placido Sou M.D.   On: 04/23/2022 21:14   CT HEAD WO CONTRAST  Result Date: 04/23/2022 CLINICAL DATA:  Dizziness EXAM: CT HEAD WITHOUT CONTRAST TECHNIQUE: Contiguous axial images were obtained from the base of the skull through the vertex without intravenous contrast. RADIATION DOSE REDUCTION: This exam was performed according to the departmental dose-optimization program which includes automated exposure control, adjustment of the mA and/or kV according to patient size and/or use of iterative reconstruction technique. COMPARISON:  10/22/2021 FINDINGS: Brain: No evidence of acute infarction, hemorrhage, hydrocephalus, extra-axial collection or mass lesion/mass effect. Scattered low-density changes within the periventricular and subcortical white matter compatible with chronic microvascular ischemic change. Mild diffuse cerebral volume loss. Vascular: Atherosclerotic calcifications involving the large vessels of the skull base. No unexpected hyperdense vessel. Skull: Normal. Negative for fracture or focal lesion. Sinuses/Orbits: No acute finding. Other: None. IMPRESSION: 1. No acute intracranial findings. 2. Chronic microvascular ischemic change and cerebral volume loss. Electronically Signed   By: Davina Poke D.O.   On: 04/23/2022 19:00     PROCEDURES:  Critical Care performed: No   Procedures    IMPRESSION / MDM / ASSESSMENT AND PLAN / ED COURSE  I reviewed the triage vital signs and the nursing notes.    Patient here with  flulike symptoms for 2 weeks.    DIFFERENTIAL DIAGNOSIS (includes but not limited to):   COVID, flu, RSV, other viral URI, pneumonia, UTI, doubt ACS, PE   Patient's presentation is most consistent with acute  presentation with potential threat to life or bodily function.   PLAN: Patient's workup initiated from triage.  No leukocytosis or leukopenia.  Normal hemoglobin.  Potassium level minimally elevated at 3.2.  Given oral replacement.  Otherwise normal electrolytes and glucose.  Troponin minimally elevated but downtrending.  Urine shows no sign of infection and no ketones to suggest dehydration.  EKG is nonischemic.  Chest x-ray reviewed and interpreted by myself and the radiologist and shows no acute abnormality.  CT head is also unremarkable.  She has been able to ambulate here with a steady gait using a cane which is her baseline with no hypoxia or increased work of breathing.  Patient does have a dry cough on exam and complains of headache, nausea and diarrhea.  Abdominal exam is benign.  No increased work of breathing, hypoxia or respiratory distress.  No tachycardia, tachypnea or fever here.  Low suspicion for sepsis, PE, intra-abdominal pathology.  Will treat with Tylenol, Zofran, Tessalon Perles, Imodium.  Patient is tolerating p.o.   MEDICATIONS GIVEN IN ED: Medications  potassium chloride SA (KLOR-CON M) CR tablet 40 mEq (40 mEq Oral Given 04/24/22 0146)  acetaminophen (TYLENOL) tablet 1,000 mg (1,000 mg Oral Given 04/24/22 0226)  benzonatate (TESSALON) capsule 100 mg (100 mg Oral Given 04/24/22 0226)  ondansetron (ZOFRAN-ODT) disintegrating tablet 4 mg (4 mg Oral Given 04/24/22 0226)  loperamide (IMODIUM) capsule 2 mg (2 mg Oral Given 04/24/22 0232)     ED COURSE: Patient upset that she is being discharged from the emergency department after just recently coming back to her room.  Explained to patient that she was brought back to her room so that we could assess her and discuss her results but there is no further emergent workup indicated at this time she does not meet criteria for admission to the hospital.  When asked if she could call a ride to take her home patient states that she has no  one that can take her home and refuses to call family at this time.  Patient then states that she will go to the waiting room and "just lie down on the floor then".  Explained to patient that we will not discharge her out of the emergency department or put her back in the waiting room since she had COVID-19 until she has a ride but we would like for her to try to attempt to get a ride given there are multiple other people in the waiting room that still need to be seen.  Patient then called her daughter and daughter agrees to come pick her up from the ED.   At this time, I do not feel there is any life-threatening condition present. I reviewed all nursing notes, vitals, pertinent previous records.  All lab and urine results, EKGs, imaging ordered have been independently reviewed and interpreted by myself.  I reviewed all available radiology reports from any imaging ordered this visit.  Based on my assessment, I feel the patient is safe to be discharged home without further emergent workup and can continue workup as an outpatient as needed. Discussed all findings, treatment plan as well as usual and customary return precautions.  They verbalize understanding and are comfortable with this plan.  Outpatient follow-up has been provided as needed.  All  questions have been answered.    CONSULTS:  none   OUTSIDE RECORDS REVIEWED: Reviewed patient's last neurology office visit on 03/13/2022.       FINAL CLINICAL IMPRESSION(S) / ED DIAGNOSES   Final diagnoses:  [id number]     Rx / DC Orders   ED Discharge Orders          Ordered    benzonatate (TESSALON PERLES) 100 MG capsule  3 times daily PRN        04/24/22 0216             Note:  This document was prepared using Dragon voice recognition software and may include unintentional dictation errors.   Jeffree Cazeau, Delice Bison, DO 04/24/22 [phone]

## 2022-04-24 NOTE — ED Notes (Signed)
Patient ambulated 50 ft, O2 sats stayed at 98% on RA.

## 2022-04-24 NOTE — Discharge Instructions (Signed)
You have been diagnosed with COVID 19.  This is a virus that can cause many different symptoms and can be extremely contagious.    You may be eligible for outpatient antiviral treatments for COVID 19 such as Paxlovid, Molnupiravir if you are within the first 5 days of symptoms. You do not need antibiotics for COVID 19 since it is a virus.  You may use over the counter medications to help manage your symptoms at home.    You may alternate Tylenol 1000 mg every 6 hours as needed for pain, fever (as long as you have no history of liver dysfunction) and Ibuprofen 800 mg every 8 hours as needed for pain, fever (as long as you have no history of kidney dysfunction).  Please take Ibuprofen with food.  Do not take more than 4000 mg of Tylenol (acetaminophen) in a 24 hour period.  Please rest and drink plenty of fluids.  You will need to quarantine from others for five days (first day of symptoms is DAY ZERO).  If your symptoms are improving or resolved at the end of this time frame, you may come out of quarantine but will need to wear a well fitted mask when around others for the next 5 days.   The best way to protect yourself and others from [place] 19 and potential long term complications is to be vaccinated and receive boosters as recommended by the [facility] and your primary care provider.  If you develop shortness of breath, blue lips or blue fingertips, vomiting that does not stop, chest pain, confusion, become severely weak or feel you may pass out, please return to the closest emergency department.

## 2022-09-11 ENCOUNTER — Emergency Department
Admission: EM | Admit: 2022-09-11 | Discharge: 2022-09-11 | Disposition: A | Discharge status: Home / Self Care | Payer: 59 | Attending: Emergency Medicine | Admitting: Emergency Medicine

## 2022-09-11 ENCOUNTER — Other Ambulatory Visit: Payer: Self-pay

## 2022-09-11 ENCOUNTER — Emergency Department: Payer: 59

## 2022-09-11 DIAGNOSIS — E119 Type 2 diabetes mellitus without complications: Secondary | ICD-10-CM | POA: Insufficient documentation

## 2022-09-11 DIAGNOSIS — W01198A Fall on same level from slipping, tripping and stumbling with subsequent striking against other object, initial encounter: Secondary | ICD-10-CM | POA: Insufficient documentation

## 2022-09-11 DIAGNOSIS — S0083XA Contusion of other part of head, initial encounter: Secondary | ICD-10-CM | POA: Diagnosis not present

## 2022-09-11 DIAGNOSIS — I10 Essential (primary) hypertension: Secondary | ICD-10-CM | POA: Diagnosis not present

## 2022-09-11 DIAGNOSIS — N390 Urinary tract infection, site not specified: Secondary | ICD-10-CM | POA: Diagnosis not present

## 2022-09-11 DIAGNOSIS — R569 Unspecified convulsions: Secondary | ICD-10-CM | POA: Diagnosis not present

## 2022-09-11 DIAGNOSIS — S0993XA Unspecified injury of face, initial encounter: Secondary | ICD-10-CM | POA: Diagnosis present

## 2022-09-11 LAB — URINALYSIS, COMPLETE (UACMP) WITH MICROSCOPIC
Bilirubin Urine: NEGATIVE
Glucose, UA: NEGATIVE mg/dL
Hgb urine dipstick: NEGATIVE
Ketones, ur: NEGATIVE mg/dL
Nitrite: NEGATIVE
Protein, ur: NEGATIVE mg/dL
Specific Gravity, Urine: 1.006 (ref 1.005–1.030)
Squamous Epithelial / HPF: NONE SEEN /HPF (ref 0–5)
pH: 5 (ref 5.0–8.0)

## 2022-09-11 LAB — COMPREHENSIVE METABOLIC PANEL
ALT: 9 U/L (ref 0–44)
AST: 17 U/L (ref 15–41)
Albumin: 4.5 g/dL (ref 3.5–5.0)
Alkaline Phosphatase: 66 U/L (ref 38–126)
Anion gap: 7 (ref 5–15)
BUN: 15 mg/dL (ref 8–23)
CO2: 26 mmol/L (ref 22–32)
Calcium: 9.4 mg/dL (ref 8.9–10.3)
Chloride: 102 mmol/L (ref 98–111)
Creatinine, Ser: 1.09 mg/dL — ABNORMAL HIGH (ref 0.44–1.00)
GFR, Estimated: 56 mL/min — ABNORMAL LOW (ref >60)
Glucose, Bld: 129 mg/dL — ABNORMAL HIGH (ref 70–99)
Potassium: 4.4 mmol/L (ref 3.5–5.1)
Sodium: 135 mmol/L (ref 135–145)
Total Bilirubin: 0.9 mg/dL (ref 0.3–1.2)
Total Protein: 7.3 g/dL (ref 6.5–8.1)

## 2022-09-11 LAB — CBC
HCT: 35.7 % — ABNORMAL LOW (ref 36.0–46.0)
Hemoglobin: 11.9 g/dL — ABNORMAL LOW (ref 12.0–15.0)
MCH: 28.6 pg (ref 26.0–34.0)
MCHC: 33.3 g/dL (ref 30.0–36.0)
MCV: 85.8 fL (ref 80.0–100.0)
Platelets: 149 10*3/uL — ABNORMAL LOW (ref 150–400)
RBC: 4.16 MIL/uL (ref 3.87–5.11)
RDW: 12.5 % (ref 11.5–15.5)
WBC: 7 10*3/uL (ref 4.0–10.5)
nRBC: 0 % (ref 0.0–0.2)

## 2022-09-11 MED ORDER — LEVETIRACETAM IN NACL 1500 MG/100ML IV SOLN
1500.0000 mg | Freq: Once | INTRAVENOUS | Status: AC
  Administered 2022-09-11: 1500 mg via INTRAVENOUS
  Filled 2022-09-11: qty 100

## 2022-09-11 MED ORDER — SODIUM CHLORIDE 0.9 % IV BOLUS
1000.0000 mL | Freq: Once | INTRAVENOUS | Status: AC
  Administered 2022-09-11: 1000 mL via INTRAVENOUS

## 2022-09-11 MED ORDER — FOSFOMYCIN TROMETHAMINE 3 G PO PACK
3.0000 g | PACK | Freq: Once | ORAL | Status: AC
  Administered 2022-09-11: 3 g via ORAL
  Filled 2022-09-11: qty 3

## 2022-09-11 NOTE — ED Provider Notes (Signed)
Patient states she is feeling significantly better. Awake, alert and oriented. Will plan on discharging with paperwork prepared by previous provider.    Phineas Semen, MD 09/11/22 [phone]

## 2022-09-11 NOTE — ED Provider Notes (Signed)
[facility] Provider Note    Event Date/Time   First MD Initiated Contact with Patient 09/11/22 1324     (approximate)  History   Chief Complaint: Seizures  HPI  Christina French is a 67 y.o. female with a past medical history of anemia, anxiety, diabetes, hypertension, seizure disorder, presents to the emergency department after a seizure.  Patient has a known seizure history and appears that the patient takes lacosamide as well as Keppra based off her chart review.  Per report a friend of the patient's was over visiting with the patient had an approximate 5-minute seizure.  EMS reports postictal in transport.  Here on arrival patient is awake alert she is oriented she is able to give a decent history, but does not recall if she took her medications this morning, unable to give a more remote history of when her last seizure was etc.  Patient does states she did fall approximately 2 weeks ago and hit her head she does have bruising to the left face from this fall.  Denies any recent illnesses fever cough congestion vomiting or diarrhea.  Physical Exam   Triage Vital Signs: ED Triage Vitals  Enc Vitals Group     BP 09/11/22 1318 120/72     Pulse Rate 09/11/22 1318 69     Resp 09/11/22 1318 15     Temp 09/11/22 1318 (!) 97.4 F (36.3 C)     Temp Source 09/11/22 1318 Oral     SpO2 09/11/22 1318 99 %     Weight 09/11/22 1327 150 lb (68 kg)     Height 09/11/22 1327 5' (1.524 m)     Head Circumference --      Peak Flow --      Pain Score 09/11/22 1327 0     Pain Loc --      Pain Edu? --      Excl. in [place]? --     Most recent vital signs: Vitals:   09/11/22 1318  BP: 120/72  Pulse: 69  Resp: 15  Temp: (!) 97.4 F (36.3 C)  SpO2: 99%    General: Awake, no distress.  Small mount of bruising to the left face which appears somewhat older. CV:  Good peripheral perfusion.  Regular rate and rhythm  Resp:  Normal effort.  Equal breath sounds bilaterally.   Abd:  No distention.  Soft, nontender.  No rebound or guarding.  ED Results / Procedures / Treatments   RADIOLOGY  I have reviewed and interpreted CT head images.  No large bleed seen on my evaluation. Radiology is read the CT scan is negative for any acute abnormality.   MEDICATIONS ORDERED IN ED: Medications  sodium chloride 0.9 % bolus 1,000 mL (has no administration in time range)  levETIRAcetam (KEPPRA) IVPB 1500 mg/ 100 mL premix (has no administration in time range)     IMPRESSION / MDM / ASSESSMENT AND PLAN / ED COURSE  I reviewed the triage vital signs and the nursing notes.  Patient's presentation is most consistent with acute presentation with potential threat to life or bodily function.  Patient presents emergency department after reported seizure lasting approximately 5 minutes.  Patient has a seizure history, but unclear when her last seizure was.  Patient appears to take Keppra and lacosamide based on her chart review.  We will load with 1500 mg of IV Keppra we will IV hydrate.  Will check labs including CBC chemistry urinalysis.  Given the  patient's seizure with bruising to her face we will obtain CT imaging the head to rule out any intracranial hemorrhage or abnormality.  Patient agreeable to plan of care will continue to closely monitor.  CT scan of the head is negative for acute abnormality.  CBC is reassuring with a normal white blood cell count, chemistry is reassuring, urinalysis does show signs of possible urinary tract infection we will dose a one-time dose of fosfomycin and send a urine culture as a precaution.  Patient is receiving IV Keppra as well as IV fluids.  Will continue to monitor in the emergency department.  As long as the patient continues to appear well and fully resolved from her mild postictal state I anticipate likely discharge home.  Patient agreeable to plan of care.  Patient care signed out to oncoming provider.  FINAL CLINICAL IMPRESSION(S) /  ED DIAGNOSES   Seizure UTI   Note:  This document was prepared using Dragon voice recognition software and may include unintentional dictation errors.   Minna Antis, MD 09/11/22 1444

## 2022-09-11 NOTE — Discharge Instructions (Addendum)
You have been seen in the emergency department following a likely seizure.  Your workup that showed overall reassuring results.  Your urinalysis does show mild urinary tract infection this has been treated with a one-time dose of antibiotics and no further antibiotics are needed.  Please continue to take your seizure medications as prescribed by your doctor and follow-up with your neurologist regarding today seizure activity.  Return to the emergency department for any further seizure activity or for any other symptom personally concerning to yourself.

## 2022-09-11 NOTE — ED Triage Notes (Signed)
Pt to ED from home via [facility] for c/o seizure lasting about 5 minutes. Pt has hx of seizures and reports being compliant with medication. EMS reports pt had returned to baseline upon arrival per daughter. Pt alert and oriented.

## 2022-09-11 NOTE — ED Notes (Signed)
Patient transported to CT 

## 2023-03-28 DEATH — deceased
